# Patient Record
Sex: Female | Born: 1975 | Race: White | Hispanic: No | Marital: Married
Health system: Midwestern US, Academic
[De-identification: ages and names within clinical notes are randomized; demographics above are authoritative.]

---

## 2016-11-04 ENCOUNTER — Encounter: Admit: 2016-11-04 | Discharge: 2016-11-04 | Payer: Private Health Insurance - Indemnity

## 2016-11-04 DIAGNOSIS — M79672 Pain in left foot: Principal | ICD-10-CM

## 2016-11-09 ENCOUNTER — Encounter: Admit: 2016-11-09 | Discharge: 2016-11-09 | Payer: Private health insurance—other commercial Indemnity

## 2016-11-09 ENCOUNTER — Encounter: Admit: 2016-11-09 | Discharge: 2016-11-09 | Payer: Private Health Insurance - Indemnity

## 2016-11-09 DIAGNOSIS — S86312A Strain of muscle(s) and tendon(s) of peroneal muscle group at lower leg level, left leg, initial encounter: Secondary | ICD-10-CM

## 2016-11-09 DIAGNOSIS — R69 Illness, unspecified: Principal | ICD-10-CM

## 2016-11-09 DIAGNOSIS — E8881 Metabolic syndrome: Principal | ICD-10-CM

## 2016-11-09 MED ORDER — OXYCODONE 5 MG PO TAB
5-10 mg | ORAL_TABLET | ORAL | 0 refills | 6.00000 days | Status: AC | PRN
Start: 2016-11-09 — End: 2017-01-28

## 2016-11-09 NOTE — Progress Notes
Patient scheduled surgery during office visit.  Date of surgery determined based on availability of both patient and Rosina Lowenstein, MD.  The patient was scheduled for Left peroneal tendon repair on 12/15/2016 at [facility].  Patient was provided with Rosina Lowenstein, MD pre-surgery packet along with post op medications and MRI order.      POV to be scheduled 2 weeks post op with Rosina Lowenstein, MD. She will be called with arrival date and time for day of surgery once scheduling is completed.   Questions answered and reassurance given.  Instructed to call [phone] for further questions or problems.    Verbalized understanding of the instructions given.      Dr. Alba Cory and patient have agreed to schedule procedure as extended recovery after surgery: No    Vitals:  Vitals:    11/09/16 1331   BP: (!) 172/111   Pulse: 79   Resp: 16   SpO2: 98%   Weight: 136.1 kg (300 lb)   Height: 182.9 cm (72")     Body mass index is 40.69 kg/m.

## 2016-11-09 NOTE — Progress Notes
Date of Service: 11/09/2016      Chief Complaint   Patient presents with   ??? 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  She is quite active and has been having this left ankle and foot pain for about a year.  To this point she is tried immobilization for about 8 weeks, physical therapy, and a steroid injection without significant relief.  She localizes her pain to the lateral aspect of her foot and ankle.  She reports that uneven ground and long distances seem to aggravate this.  She reports the steroid injection helped for a few weeks but she started having some skin changes in that area.  She does report some dorsal foot pain as well but she feels that this is compensatory for her other pain.         I have personally reviewed the patient intake form with the patient today, it was signed by me and scanned into O2. Please see below for details.         Past Medical History:  Past Medical History:   Diagnosis Date   ??? Insulin resistance        Past Surgical History:   Procedure Laterality Date   ??? HX TONSILLECTOMY         Allergies:  Patient has no known allergies.    Current Medications:  ??? metFORMIN (GLUCOPHAGE) 1,000 mg tablet Take 1,000 mg by mouth twice daily with meals.   ??? montelukast (SINGULAIR) 10 mg tablet Take 10 mg by mouth at bedtime daily.       Social History:  History   Smoking Status   ??? Never Smoker   Smokeless Tobacco   ??? Never Used     History   Drug Use No     History   Alcohol Use No       Family History   Problem Relation Age of Onset   ??? Heart Attack Mother    ??? Cancer Mother    ??? Diabetes Mother    ??? Heart Disease Father    ??? Hypothyroid Father        Review Of Systems:  A 14 point review of systems including HEENT, cardiovascular, pulmonary, gastrointestinal,?genitourinary, psychiatric, neurologic, musculoskeletal, endocrine, and integumentary are negative unless otherwise noted in the history of present illness or on the signed intake form.        Objective:           Vitals:    11/09/16 1331   BP: (!) 172/111   Pulse: 79   Resp: 16   SpO2: 98%   Weight: 136.1 kg (300 lb)   Height: 182.9 cm (72)     Body mass index is 40.69 kg/m???.     General alert cooperative no acute distress  Heart regular  Abdomen soft  Breathing unlabored on room air    Left foot and ankle exam: Patient is approximately 30 degrees of dorsiflexion about her ankle with proximally 40 degrees of plantarflexion.  She has some tenderness palpation along the entire course of her peroneal tendons however the majority of her pain is at the insertion site of her peroneus brevis.  She does have some tenderness palpation along her posterior tibial tendon but this is not as bothersome for her.  She has 5 out of 5 strength with resisted eversion inversion however both of these cause lateral sided ankle  and foot pain.  She has 5 out of 5 strength with dorsi and plantarflexion of her ankle.  Her sensation is intact light touch.  Her pulses are palpable.  She does not have any Achilles tendon contracture.    Imaging: 3 views left foot and ankle weightbearing x-rays were obtained demonstrate no significant osseous abnormality.  An outside hospital MRI of her foot was reviewed and demonstrate some split tearing of the peroneus brevis near its insertion site with some evidence of tendinopathy of both peroneal tendons.  This MRI however does not include the entirety of the peroneal tendons and stops just distal to the fibula.    Assessment and plan  41 year old female  1.  Left split tear peroneus brevis with peroneal tendinitis    Discussed the diagnosis and treatment options with the patient.  We discussed with her that given her failure of nonoperative treatment we are recommending a peroneal tendon exploration with repair of the peroneus brevis as indicated.  We would like to obtain an MRI of her ankle to assess the proximal extent of the peroneal tendons given her tenderness along these to ensure that there is not a more proximal tendon tear.  She would like to undergo the above procedure.  The risks and benefits of this procedure would discussed in detail the patient's.  These risks include infection, continued pain, DVT, PE and need for additional procedures.  She understood all of these risks and the postoperative convalescence was discussed with the patient and she elected to proceed.    ATTESTATION  I personally performed the E/M including history, physical exam, and MDM.    Staff name:  Abran Duke, MD Date:  11/09/2016          Abran Duke, MD    Portions of this noted may have been created using Dragon, a voice recognition software.  Please contact my office for any clarification of documentation    Please send a copy of office notes to the primary care physician and referring providers

## 2016-11-10 ENCOUNTER — Ambulatory Visit: Admit: 2016-11-09 | Discharge: 2016-11-09 | Payer: Private health insurance—other commercial Indemnity

## 2016-11-10 ENCOUNTER — Ambulatory Visit: Admit: 2016-11-10 | Discharge: 2016-11-10 | Payer: Private Health Insurance - Indemnity

## 2016-11-10 DIAGNOSIS — M79672 Pain in left foot: ICD-10-CM

## 2016-11-10 DIAGNOSIS — M7672 Peroneal tendinitis, left leg: Principal | ICD-10-CM

## 2016-11-10 DIAGNOSIS — S86312A Strain of muscle(s) and tendon(s) of peroneal muscle group at lower leg level, left leg, initial encounter: ICD-10-CM

## 2016-11-11 ENCOUNTER — Encounter: Admit: 2016-11-11 | Discharge: 2016-11-11 | Payer: Private health insurance—other commercial Indemnity

## 2016-11-11 ENCOUNTER — Ambulatory Visit: Admit: 2016-12-15 | Discharge: 2016-12-15 | Payer: Private Health Insurance - Indemnity

## 2016-11-11 DIAGNOSIS — M7672 Peroneal tendinitis, left leg: ICD-10-CM

## 2016-11-11 DIAGNOSIS — S86312D Strain of muscle(s) and tendon(s) of peroneal muscle group at lower leg level, left leg, subsequent encounter: ICD-10-CM

## 2016-11-11 DIAGNOSIS — S86312S Strain of muscle(s) and tendon(s) of peroneal muscle group at lower leg level, left leg, sequela: Principal | ICD-10-CM

## 2016-11-11 MED ORDER — CEFAZOLIN INJ 1GM IVP
2 g | Freq: Once | INTRAVENOUS | 0 refills | Status: CN
Start: 2016-11-11 — End: ?

## 2016-12-09 ENCOUNTER — Encounter: Admit: 2016-12-09 | Discharge: 2016-12-09 | Payer: Private health insurance—other commercial Indemnity

## 2016-12-09 DIAGNOSIS — J302 Other seasonal allergic rhinitis: ICD-10-CM

## 2016-12-09 DIAGNOSIS — E8881 Metabolic syndrome: Principal | ICD-10-CM

## 2016-12-09 NOTE — Pre-Anesthesia Medication Instructions
YOUR MEDICATIONS    Current Medications    Medication Directions   metFORMIN (GLUCOPHAGE) 1,000 mg tablet Take 1,000 mg by mouth twice daily with meals.   montelukast (SINGULAIR) 10 mg tablet Take 10 mg by mouth at bedtime daily.   oxyCODONE (ROXICODONE, OXY-IR) 5 mg tablet Take one tablet to two tablets by mouth every 4-6 hours as needed for Pain Do not take prior to surgery.           Before surgery  Stop these medicines days before surgery:  HOLD MORNING OF SURGERY GLUCOPHAGE      Morning of surgery  On the morning of surgery, take ONLY these medicines with a sip (1-2 ounces) of water:  NA    Before going home from the hospital, please ask your doctor when you should re-start your medicines that were stopped before surgery.

## 2016-12-09 NOTE — Pre-Anesthesia Patient Instructions
GENERAL INFORMATION    Before you come to the hospital  ??? Make arrangements for a responsible adult to drive you home and stay with you for 24 hours following surgery.  ??? Bath/Shower Instructions  ??? Please refer to Pre-Surgery Shower Instruction sheet  ??? Leave money, credit cards, jewelry, and any other valuables at home. The [facility] is not responsible for the loss or breakage of personal items.  ??? Remove nail polish, makeup and all jewelry (including piercings) before coming to the hospital.  ??? The morning of your procedure:  ??? brush your teeth and tongue  ??? do not smoke  ??? do not shave the area where you will have surgery    What to bring to the hospital  ??? ID/ Insurance Card  ??? Medical Device card  ??? Official documents for legal guardianship   ??? Copy of your Living Will, Advanced Directives, and/or Durable Power of Attorney   ??? Small bag with a few personal belongings  ??? Cases for glasses/hearing aids/contact lens (bring solutions for contacts)  ??? Dress in clean, loose, comfortable clothing   ??? Other personal items such as canes, walkers, and medications in original containers if applicable  ??? CPAP or BiPAP Machine: If it is a [employer]/Respironics System One machine please bring machine/humidifier and tubing/mask. All other brands, please bring tubing/mask only on the day of surgery.     Eating or drinking before surgery  ??? Do not eat anything after 11:00 p.m. the day before your procedure (including gum, mints, candy, or chewing tobacco).  ??? Other instructions: You may have water, tea, apple or cranberry juice, or coffee (black or with sugar only - NO CREAM OR MILK) until 1045 day of surgery.     Other instructions  Notify your surgeon if:  ??? there is a possibility that you are pregnant  ??? you become ill with a cough, fever, sore throat, nausea, vomiting or flu-like symptoms  ??? you have any open wounds/sores that are red, painful, draining, or are new since you last saw  the doctor ??? you need to cancel your procedure    Notify us at [facility]: [phone]  ??? if you need to cancel your procedure  ??? if you are going to be late    Arrival at the hospital  [place] - Your surgery is scheduled on Nov 21 at 1245.  Please arrive at 1045  ??? The [address] is located at [employer]. This is at the [employer] of [address]  ??? Use the main entrance of the hospital, at the east side of the building. Parking is free.  ??? Check-in for surgery is inside the main entrance.

## 2016-12-10 ENCOUNTER — Encounter: Admit: 2016-12-10 | Discharge: 2016-12-11 | Payer: Private Health Insurance - Indemnity

## 2016-12-10 DIAGNOSIS — R69 Illness, unspecified: Principal | ICD-10-CM

## 2016-12-13 ENCOUNTER — Encounter: Admit: 2016-12-13 | Discharge: 2016-12-13 | Payer: Private health insurance—other commercial Indemnity

## 2016-12-13 DIAGNOSIS — R69 Illness, unspecified: Principal | ICD-10-CM

## 2016-12-15 ENCOUNTER — Encounter: Admit: 2016-12-15 | Discharge: 2016-12-15 | Payer: Private health insurance—other commercial Indemnity

## 2016-12-15 ENCOUNTER — Encounter: Admit: 2016-12-15 | Discharge: 2016-12-15 | Payer: Private Health Insurance - Indemnity

## 2016-12-15 ENCOUNTER — Ambulatory Visit: Admit: 2016-12-15 | Discharge: 2016-12-15 | Payer: Private health insurance—other commercial Indemnity

## 2016-12-15 DIAGNOSIS — S93492A Sprain of other ligament of left ankle, initial encounter: ICD-10-CM

## 2016-12-15 DIAGNOSIS — J302 Other seasonal allergic rhinitis: ICD-10-CM

## 2016-12-15 DIAGNOSIS — M7672 Peroneal tendinitis, left leg: ICD-10-CM

## 2016-12-15 DIAGNOSIS — S86312A Strain of muscle(s) and tendon(s) of peroneal muscle group at lower leg level, left leg, initial encounter: Principal | ICD-10-CM

## 2016-12-15 DIAGNOSIS — E8881 Metabolic syndrome: Principal | ICD-10-CM

## 2016-12-15 MED ORDER — HALOPERIDOL LACTATE 5 MG/ML IJ SOLN
1 mg | Freq: Once | INTRAVENOUS | 0 refills | Status: DC | PRN
Start: 2016-12-15 — End: 2016-12-16

## 2016-12-15 MED ORDER — FENTANYL CITRATE (PF) 50 MCG/ML IJ SOLN
0 refills | Status: DC
Start: 2016-12-15 — End: 2016-12-15
  Administered 2016-12-15: 20:00:00 25 ug via INTRAVENOUS
  Administered 2016-12-15 (×2): 50 ug via INTRAVENOUS
  Administered 2016-12-15: 20:00:00 25 ug via INTRAVENOUS
  Administered 2016-12-15 (×2): 50 ug via INTRAVENOUS

## 2016-12-15 MED ORDER — LIDOCAINE (PF) 10 MG/ML (1 %) IJ SOLN
.1-2 mL | Freq: Once | INTRAMUSCULAR | 0 refills | Status: DC
Start: 2016-12-15 — End: 2016-12-16

## 2016-12-15 MED ORDER — DEXTRAN 70-HYPROMELLOSE (PF) 0.1-0.3 % OP DPET
0 refills | Status: DC
Start: 2016-12-15 — End: 2016-12-15
  Administered 2016-12-15: 20:00:00 2 [drp] via OPHTHALMIC

## 2016-12-15 MED ORDER — ACETAMINOPHEN 500 MG PO TAB
1000 mg | Freq: Once | ORAL | 0 refills | Status: CP
Start: 2016-12-15 — End: ?
  Administered 2016-12-15: 19:00:00 1000 mg via ORAL

## 2016-12-15 MED ORDER — FENTANYL CITRATE (PF) 50 MCG/ML IJ SOLN
25 ug | INTRAVENOUS | 0 refills | Status: DC | PRN
Start: 2016-12-15 — End: 2016-12-16

## 2016-12-15 MED ORDER — OXYCODONE SR 10 MG PO 12HR TABLET
10 mg | Freq: Once | ORAL | 0 refills | Status: CP
Start: 2016-12-15 — End: ?
  Administered 2016-12-15: 19:00:00 10 mg via ORAL

## 2016-12-15 MED ORDER — MIDAZOLAM 1 MG/ML IJ SOLN
INTRAVENOUS | 0 refills | Status: DC
Start: 2016-12-15 — End: 2016-12-15
  Administered 2016-12-15: 20:00:00 2 mg via INTRAVENOUS

## 2016-12-15 MED ORDER — ONDANSETRON HCL (PF) 4 MG/2 ML IJ SOLN
INTRAVENOUS | 0 refills | Status: DC
Start: 2016-12-15 — End: 2016-12-15
  Administered 2016-12-15: 21:00:00 4 mg via INTRAVENOUS

## 2016-12-15 MED ORDER — PROMETHAZINE 25 MG/ML IJ SOLN
6.25 mg | INTRAVENOUS | 0 refills | Status: DC | PRN
Start: 2016-12-15 — End: 2016-12-16

## 2016-12-15 MED ORDER — DEXAMETHASONE SODIUM PHOSPHATE 4 MG/ML IJ SOLN
INTRAVENOUS | 0 refills | Status: DC
Start: 2016-12-15 — End: 2016-12-15
  Administered 2016-12-15: 20:00:00 5 mg via INTRAVENOUS

## 2016-12-15 MED ORDER — HYDROMORPHONE (PF) 2 MG/ML IJ SYRG
.5 mg | INTRAVENOUS | 0 refills | Status: DC | PRN
Start: 2016-12-15 — End: 2016-12-16

## 2016-12-15 MED ORDER — CEFAZOLIN INJ 1GM IVP
3 g | Freq: Once | INTRAVENOUS | 0 refills | Status: CP
Start: 2016-12-15 — End: ?
  Administered 2016-12-15: 21:00:00 3 g via INTRAVENOUS

## 2016-12-15 MED ORDER — PROPOFOL INJ 10 MG/ML IV VIAL
0 refills | Status: DC
Start: 2016-12-15 — End: 2016-12-15
  Administered 2016-12-15: 21:00:00 20 mg via INTRAVENOUS
  Administered 2016-12-15: 20:00:00 200 mg via INTRAVENOUS
  Administered 2016-12-15: 21:00:00 20 mg via INTRAVENOUS
  Administered 2016-12-15: 21:00:00 50 mg via INTRAVENOUS
  Administered 2016-12-15: 21:00:00 10 mg via INTRAVENOUS
  Administered 2016-12-15 (×5): 20 mg via INTRAVENOUS

## 2016-12-15 MED ORDER — LIDOCAINE (PF) 200 MG/10 ML (2 %) IJ SYRG
0 refills | Status: DC
Start: 2016-12-15 — End: 2016-12-15
  Administered 2016-12-15: 20:00:00 100 mg via INTRAVENOUS

## 2016-12-15 MED ORDER — LACTATED RINGERS IV SOLP
1000 mL | INTRAVENOUS | 0 refills | Status: DC
Start: 2016-12-15 — End: 2016-12-16
  Administered 2016-12-15: 21:00:00 1000.000 mL via INTRAVENOUS
  Administered 2016-12-15: 17:00:00 1000 mL via INTRAVENOUS

## 2016-12-15 MED ORDER — ONDANSETRON HCL (PF) 4 MG/2 ML IJ SOLN
4 mg | Freq: Once | INTRAVENOUS | 0 refills | Status: DC | PRN
Start: 2016-12-15 — End: 2016-12-16

## 2016-12-15 MED ORDER — FENTANYL CITRATE (PF) 50 MCG/ML IJ SOLN
50 ug | INTRAVENOUS | 0 refills | Status: DC | PRN
Start: 2016-12-15 — End: 2016-12-16

## 2016-12-15 MED ORDER — BACITRACIN-POLYMYXIN B 500-10,000 UNIT/GRAM TP OINT
0 refills | Status: DC
Start: 2016-12-15 — End: 2016-12-15
  Administered 2016-12-15: 21:00:00 1 g via TOPICAL

## 2016-12-15 MED ORDER — MEPERIDINE (PF) 25 MG/ML IJ SYRG
12.5 mg | INTRAVENOUS | 0 refills | Status: DC | PRN
Start: 2016-12-15 — End: 2016-12-16

## 2016-12-15 MED ORDER — GABAPENTIN 300 MG PO CAP
300 mg | Freq: Once | ORAL | 0 refills | Status: CP
Start: 2016-12-15 — End: ?
  Administered 2016-12-15: 19:00:00 300 mg via ORAL

## 2016-12-15 MED ORDER — BACITRACIN 50,000 UN NS 1000 ML IRR BOT (OR)
0 refills | Status: DC
Start: 2016-12-15 — End: 2016-12-15
  Administered 2016-12-15 (×2): 1000 mL

## 2016-12-15 MED ORDER — OXYCODONE 5 MG PO TAB
5-10 mg | Freq: Once | ORAL | 0 refills | Status: CP | PRN
Start: 2016-12-15 — End: ?
  Administered 2016-12-15: 22:00:00 10 mg via ORAL

## 2016-12-15 MED ORDER — ROPIVACAINE (PF) 5 MG/ML (0.5 %) IJ SOLN
0 refills | Status: DC
Start: 2016-12-15 — End: 2016-12-15
  Administered 2016-12-15: 22:00:00 30 mL

## 2016-12-15 NOTE — Progress Notes
I have reviewed the notes, assessments, and/or procedures performed by Felizardo Hoffmann SN, and concur with her documentation unless otherwise noted.

## 2016-12-15 NOTE — Anesthesia Post-Procedure Evaluation
Post-Anesthesia Evaluation    Name: Jennifer Durham      MRN: [id number]     DOB: Feb 12, 1975     Age: 41 y.o.     Sex: female   __________________________________________________________________________     Procedure Date: 12/15/2016  Procedure: Procedure(s):  LEFT PERONEAL TENDON REPAIR      Surgeon: Surgeon(s):  Vopat, Doran Durand, MD    Post-Anesthesia Vitals  BP: 143/97 (11/21 1630)  Temp: 36.7 C (98.1 F) (11/21 1630)  Pulse: 70 (11/21 1630)  Respirations: 8 PER MINUTE (11/21 1630)  SpO2: 95 % (11/21 1630)  O2 Delivery: None (Room Air) (11/21 1630)  SpO2 Pulse: 71 (11/21 1630)  Height: 182.9 cm (72") (11/21 1018)      Post Anesthesia Evaluation Note    Evaluation location: Pre/Post  Patient participation: recovered; patient participated in evaluation  Level of consciousness: alert    Pain score: 2  Pain management: adequate    Hydration: normovolemia  Temperature: 36.0C - 38.4C  Airway patency: adequate    Regional/Neuraxial:       Neurological status: sensory deficit      Single injection shot performed    Perioperative Events  Perioperative events:  no       Post-op nausea and vomiting: no PONV    Postoperative Status  Cardiovascular status: hemodynamically stable  Respiratory status: spontaneous ventilation  Follow-up needed: none        Perioperative Events  Perioperative Event: No  Emergency Case Activation: No

## 2016-12-15 NOTE — Progress Notes
I have given crutches to the patient, adjusted them and provided complete instructions on safe use.

## 2016-12-15 NOTE — H&P (View-Only)
??? Left Foot - New Patient       ???  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  She is quite active and has been having this left ankle and foot pain for about a year.  To this point she is tried immobilization for about 8 weeks, physical therapy, and a steroid injection without significant relief.  She localizes her pain to the lateral aspect of her foot and ankle.  She reports that uneven ground and long distances seem to aggravate this.  She reports the steroid injection helped for a few weeks but she started having some skin changes in that area.  She does report some dorsal foot pain as well but she feels that this is compensatory for her other pain.  ???  ???  I have personally reviewed the patient intake form with the patient today, it was signed by me and scanned into O2. Please see below for details.  ???  ???  Past Medical History:       Past Medical History:   Diagnosis Date   ??? Insulin resistance ???   ???  ???        Past Surgical History:   Procedure Laterality Date   ??? HX TONSILLECTOMY ??? ???   ???  ???  Allergies:  Patient has no known allergies.  ???  Current Medications:  ??? metFORMIN (GLUCOPHAGE) 1,000 mg tablet Take 1,000 mg by mouth twice daily with meals.   ??? montelukast (SINGULAIR) 10 mg tablet Take 10 mg by mouth at bedtime daily.   ???  ???  Social History:      History   Smoking Status   ??? Never Smoker   Smokeless Tobacco   ??? Never Used   ???      History   Drug Use No   ???      History   Alcohol Use No   ???  ???        Family History   Problem Relation Age of Onset   ??? Heart Attack Mother ???   ??? Cancer Mother ???   ??? Diabetes Mother ???   ??? Heart Disease Father ???   ??? Hypothyroid Father ???   ???  ???  Review Of Systems:  A 14 point review of systems including HEENT, cardiovascular, pulmonary, gastrointestinal,?genitourinary, psychiatric, neurologic, musculoskeletal, endocrine, and integumentary are negative unless otherwise noted in the history of present illness or on the signed intake form.  ???  ???  ??? Objective:         ???      Vitals:   ??? 11/09/16 1331   BP: (!) 172/111   Pulse: 79   Resp: 16   SpO2: 98%   Weight: 136.1 kg (300 lb)   Height: 182.9 cm (72)   ???  Body mass index is 40.69 kg/m???.   ???  General alert cooperative no acute distress  Heart regular  Abdomen soft  Breathing unlabored on room air  ???  Left foot and ankle exam: Patient is approximately 30 degrees of dorsiflexion about her ankle with proximally 40 degrees of plantarflexion.  She has some tenderness palpation along the entire course of her peroneal tendons however the majority of her pain is at the insertion site of her peroneus brevis.  She does have some tenderness palpation along her posterior tibial tendon but this is not as bothersome for her.  She has 5 out of 5 strength with resisted eversion inversion  however both of these cause lateral sided ankle and foot pain.  She has 5 out of 5 strength with dorsi and plantarflexion of her ankle.  Her sensation is intact light touch.  Her pulses are palpable.  She does not have any Achilles tendon contracture.  ???  Imaging: 3 views left foot and ankle weightbearing x-rays were obtained demonstrate no significant osseous abnormality.  An outside hospital MRI of her foot was reviewed and demonstrate some split tearing of the peroneus brevis near its insertion site with some evidence of tendinopathy of both peroneal tendons.  This MRI however does not include the entirety of the peroneal tendons and stops just distal to the fibula.  ???  Assessment and plan  41 year old female  1.  Left split tear peroneus brevis with peroneal tendinitis  ???  Discussed the diagnosis and treatment options with the patient.  We discussed with her that given her failure of nonoperative treatment we are recommending a peroneal tendon exploration with repair of the peroneus brevis as indicated.  We would like to obtain an MRI of her ankle to assess the proximal extent of the peroneal tendons given her tenderness along these to ensure that there is not a more proximal tendon tear.  She would like to undergo the above procedure.  The risks and benefits of this procedure would discussed in detail the patient's.  These risks include infection, continued pain, DVT, PE and need for additional procedures.  She understood all of these risks and the postoperative convalescence was discussed with the patient and she elected to proceed.  ???

## 2016-12-15 NOTE — Other
Brief Operative Note    Name: Jennifer Durham is a 41 y.o. female     DOB: [birth year]             MRN#: [id number]  DATE OF OPERATION: 12/15/2016    Date:  12/15/2016        Preoperative Dx:   Tear of peroneal tendon, left, sequela [S86.312S]  Peroneal tendinitis of left lower extremity [M76.72]    Post-op Diagnosis      * Tear of peroneal tendon, left, sequela [S86.312S]     * Peroneal tendinitis of left lower extremity [M76.72]    Procedure(s) (LRB):  LEFT PERONEAL TENDON REPAIR (Left)    Anesthesia Type: General    Surgeon(s) and Role:     * Britteney Ayotte, Doran Durand, MD - Primary      Findings:  Left peroneal brevis tear    Estimated Blood Loss: No blood loss documented.     Specimen(s) Removed/Disposition: * No specimens in log *    Complications:  None    Implants: None    Drains: None    Disposition:  PACU - stable    Rosina Lowenstein, MD  Pager

## 2016-12-15 NOTE — Anesthesia Procedure Notes
Anesthesia Procedure: Peripheral Nerve Block    PERIPHERAL NERVE BLOCK  Date/Time: 12/15/2016 4:11 PM    Patient location: post-op  Reason for block: post-op pain management    Preprocedure checklist performed: 2 patient identifiers, risks & benefits discussed, patient evaluated, timeout performed, consent obtained and patient being monitored    Sterile technique:  - Proper hand washing  - Cap, mask  - Sterile gloves  - Skin prep for antisepsis        Peripheral Nerve Block Procedure   Patient position: supine  Prep: ChloraPrep    Monitoring: BP, EKG and continuous pulse ox  Block type: popliteal  Laterality: left  Injection technique: single-shot  Procedures: ultrasound guided      Needle/cathether:      Needle type: Stimuplex      Needle gauge: 22 G; Needle length: 4 in     Needle location: ultrasound guidance    Procedure Outcome   Injection assessment: negative aspiration for heme, no paresthesia on injection, incremental injection and local visualized surrounding nerve on ultrasound  Observations: adequate block, comfortable throughout block, no sedation and patient tolerated the procedure well with no immediate complications          Performed by: Fransisco Beau  Authorized by: Fransisco Beau

## 2016-12-20 ENCOUNTER — Encounter: Admit: 2016-12-20 | Discharge: 2016-12-20 | Payer: Private health insurance—other commercial Indemnity

## 2016-12-20 DIAGNOSIS — E8881 Metabolic syndrome: Principal | ICD-10-CM

## 2016-12-20 DIAGNOSIS — J302 Other seasonal allergic rhinitis: ICD-10-CM

## 2017-01-04 ENCOUNTER — Ambulatory Visit: Admit: 2017-01-04 | Discharge: 2017-01-05 | Payer: Private Health Insurance - Indemnity

## 2017-01-04 ENCOUNTER — Encounter: Admit: 2017-01-04 | Discharge: 2017-01-04 | Payer: Private Health Insurance - Indemnity

## 2017-01-04 DIAGNOSIS — S86312S Strain of muscle(s) and tendon(s) of peroneal muscle group at lower leg level, left leg, sequela: ICD-10-CM

## 2017-01-04 DIAGNOSIS — E8881 Metabolic syndrome: Principal | ICD-10-CM

## 2017-01-04 DIAGNOSIS — J302 Other seasonal allergic rhinitis: ICD-10-CM

## 2017-01-05 DIAGNOSIS — Z9889 Other specified postprocedural states: Principal | ICD-10-CM

## 2017-01-05 NOTE — Progress Notes
Jennifer Durham, Jennifer Durham  MRN #[id number]    January 04, 2017    SUBJECTIVE:  The patient is status post left peroneus brevis repair and peroneus longus tenolysis on 12/15/2016.  She is doing very well and has minimal pain.    PHYSICAL EXAMINATION:  On physical examination, the incisions are clean, dry, and intact.  She has a little bit of numbness over the sural nerve distribution, but she is able to feel sensation.    ASSESSMENT AND PLAN:  Status post surgery as above.  The patient will have the sutures out and be placed in a cast.  She will be touch-down weightbearing.  She will follow up in four weeks.  We will get her out of the cast at that time and start partial weightbearing.  She agreed with this plan and all questions were answered.        BV/abc:kap Abran Duke, MD          BP 145/77  - Pulse 88  - Resp 16  - Ht 182.9 cm (72)  - Wt 136.1 kg (300 lb)  - SpO2 96%  - BMI 40.69 kg/m???     No orders of the defined types were placed in this encounter.      Current Medications:  ??? levonorgestrel (MIRENA) 20 mcg/24 hr (5 years) intrauterine device   1 EA, X 1 DOSE, Intrauteral, 1 EA, 0 Number of Refills, DEV   ??? metFORMIN (GLUCOPHAGE) 1,000 mg tablet Take 1,000 mg by mouth twice daily with meals.   ??? montelukast (SINGULAIR) 10 mg tablet Take 10 mg by mouth at bedtime daily.   ??? oxyCODONE (ROXICODONE, OXY-IR) 5 mg tablet Take one tablet to two tablets by mouth every 4-6 hours as needed for Pain Do not take prior to surgery.     No Known Allergies      General Physical Exam:  General/Constitutional:No apparent distress: well-nourished and well developed.  Eyes: Sclera nonicteric, conjunctiva clear  Respiratory:No shortness of breath or dyspnea  Cardiac: No clubbing, cyanosis, or edema  Vascular: No edema, swelling or tenderness, except as noted in detailed exam.  Integumentary:No impressive skin lesions present, except as noted in detailed exam. Neuro/Psych: Normal mood and affect, oriented to person, place and time.  Musculoskeletal: Normal, except as noted in detailed exam and in HPI.    ATTESTATION    I personally performed the E/M including history, physical exam, and MDM.    Staff name:  Abran Duke, MD Date:  01/05/2017          Abran Duke, MD    Portions of this noted may have been created using Dragon, a voice recognition software.  Please contact my office for any clarification of documentation.    Please send a copy of office notes to the primary care physician and referring providers.

## 2017-01-19 ENCOUNTER — Encounter: Admit: 2017-01-19 | Discharge: 2017-01-19 | Payer: Private health insurance—other commercial Indemnity

## 2017-01-19 NOTE — Telephone Encounter
Tomorrow called today and said she has leg cramps in her surgical leg, L, and I suggested she elevate and hydrate with lots of water.  Her surgery was 11/21/.  She will call tomorrow if she still has concerns.  All questions were answered.    Derrill Memo, RN, CPAN

## 2017-01-28 ENCOUNTER — Encounter: Admit: 2017-01-28 | Discharge: 2017-01-28 | Payer: Private Health Insurance - Indemnity

## 2017-01-28 ENCOUNTER — Ambulatory Visit: Admit: 2017-01-28 | Discharge: 2017-01-29 | Payer: Private Health Insurance - Indemnity

## 2017-01-28 DIAGNOSIS — E8881 Metabolic syndrome: Principal | ICD-10-CM

## 2017-01-28 DIAGNOSIS — J302 Other seasonal allergic rhinitis: ICD-10-CM

## 2017-01-29 DIAGNOSIS — Z9889 Other specified postprocedural states: Principal | ICD-10-CM

## 2017-03-11 ENCOUNTER — Ambulatory Visit: Admit: 2017-03-11 | Discharge: 2017-03-12 | Payer: Private Health Insurance - Indemnity

## 2017-03-11 ENCOUNTER — Encounter: Admit: 2017-03-11 | Discharge: 2017-03-11 | Payer: Private Health Insurance - Indemnity

## 2017-03-11 DIAGNOSIS — J302 Other seasonal allergic rhinitis: ICD-10-CM

## 2017-03-11 DIAGNOSIS — E8881 Metabolic syndrome: Principal | ICD-10-CM

## 2017-03-12 DIAGNOSIS — Z9889 Other specified postprocedural states: Principal | ICD-10-CM

## 2017-04-28 ENCOUNTER — Encounter: Admit: 2017-04-28 | Discharge: 2017-04-28 | Payer: Private Health Insurance - Indemnity

## 2017-04-28 DIAGNOSIS — E8881 Metabolic syndrome: Principal | ICD-10-CM

## 2017-04-28 DIAGNOSIS — J302 Other seasonal allergic rhinitis: ICD-10-CM

## 2017-07-15 ENCOUNTER — Encounter: Admit: 2017-07-15 | Discharge: 2017-07-15 | Payer: Private health insurance—other commercial Indemnity

## 2017-07-15 DIAGNOSIS — E8881 Metabolic syndrome: Principal | ICD-10-CM

## 2017-07-15 DIAGNOSIS — J302 Other seasonal allergic rhinitis: ICD-10-CM

## 2017-07-16 ENCOUNTER — Ambulatory Visit: Admit: 2017-07-15 | Discharge: 2017-07-16 | Payer: Private Health Insurance - Indemnity

## 2017-07-16 DIAGNOSIS — Z9889 Other specified postprocedural states: Principal | ICD-10-CM

## 2018-02-02 ENCOUNTER — Encounter: Admit: 2018-02-02 | Discharge: 2018-02-02

## 2018-02-02 DIAGNOSIS — M25572 Pain in left ankle and joints of left foot: Secondary | ICD-10-CM

## 2019-07-10 ENCOUNTER — Encounter: Admit: 2019-07-10 | Discharge: 2019-07-10 | Payer: Private Health Insurance - Indemnity

## 2019-07-10 DIAGNOSIS — C50911 Malignant neoplasm of unspecified site of right female breast: Secondary | ICD-10-CM

## 2019-07-11 ENCOUNTER — Encounter: Admit: 2019-07-11 | Discharge: 2019-07-11 | Payer: Private Health Insurance - Indemnity

## 2019-07-11 NOTE — Progress Notes
H&E Slides of [facility] # [id number] Path Date  07/04/19, requested on 07/11/19  From [facility].    Contact for outside path lab is @ [phone]   No shipping label faxed per request of outside facility.  Path slides being sent via courier.

## 2019-07-12 ENCOUNTER — Encounter: Admit: 2019-07-12 | Discharge: 2019-07-12 | Payer: Private Health Insurance - Indemnity

## 2019-07-12 DIAGNOSIS — C50919 Malignant neoplasm of unspecified site of unspecified female breast: Secondary | ICD-10-CM

## 2019-07-12 DIAGNOSIS — J302 Other seasonal allergic rhinitis: Secondary | ICD-10-CM

## 2019-07-12 DIAGNOSIS — E8881 Metabolic syndrome: Secondary | ICD-10-CM

## 2019-07-13 ENCOUNTER — Encounter: Admit: 2019-07-13 | Discharge: 2019-07-13 | Payer: Private Health Insurance - Indemnity

## 2019-07-16 ENCOUNTER — Ambulatory Visit: Admit: 2019-07-16 | Discharge: 2019-07-16 | Payer: Private Health Insurance - Indemnity

## 2019-07-16 ENCOUNTER — Encounter: Admit: 2019-07-16 | Discharge: 2019-07-16 | Payer: Private Health Insurance - Indemnity

## 2019-07-16 DIAGNOSIS — J302 Other seasonal allergic rhinitis: Secondary | ICD-10-CM

## 2019-07-16 DIAGNOSIS — C50911 Malignant neoplasm of unspecified site of right female breast: Secondary | ICD-10-CM

## 2019-07-16 DIAGNOSIS — C50919 Malignant neoplasm of unspecified site of unspecified female breast: Secondary | ICD-10-CM

## 2019-07-16 DIAGNOSIS — E282 Polycystic ovarian syndrome: Secondary | ICD-10-CM

## 2019-07-16 DIAGNOSIS — E8881 Metabolic syndrome: Secondary | ICD-10-CM

## 2019-07-16 DIAGNOSIS — I10 Essential (primary) hypertension: Secondary | ICD-10-CM

## 2019-07-16 DIAGNOSIS — C50411 Malignant neoplasm of upper-outer quadrant of right female breast: Secondary | ICD-10-CM

## 2019-07-16 DIAGNOSIS — Z01818 Encounter for other preprocedural examination: Secondary | ICD-10-CM

## 2019-07-16 DIAGNOSIS — Z9189 Other specified personal risk factors, not elsewhere classified: Secondary | ICD-10-CM

## 2019-07-16 LAB — COMPREHENSIVE METABOLIC PANEL
Lab: 0.3 mg/dL (ref 0.3–1.2)
Lab: 1.2 mg/dL — ABNORMAL HIGH (ref 0.4–1.00)
Lab: 10 10*3/uL (ref 3–12)
Lab: 10 mg/dL (ref 8.5–10.6)
Lab: 104 MMOL/L (ref 98–110)
Lab: 11 U/L (ref 7–40)
Lab: 133 mg/dL — ABNORMAL HIGH (ref 70–100)
Lab: 140 MMOL/L (ref 137–147)
Lab: 20 mg/dL (ref 7–25)
Lab: 26 MMOL/L (ref 21–30)
Lab: 3.8 MMOL/L (ref 3.5–5.1)
Lab: 4.6 g/dL (ref 3.5–5.0)
Lab: 46 mL/min — ABNORMAL LOW (ref >60)
Lab: 56 U/L (ref 25–110)
Lab: 56 mL/min — ABNORMAL LOW (ref >60)
Lab: 7 U/L — ABNORMAL HIGH (ref 7–56)
Lab: 7.8 g/dL (ref 6.0–8.0)

## 2019-07-16 LAB — CBC AND DIFF
Lab: 0.1 10*3/uL (ref 0–0.20)
Lab: 12 10*3/uL — ABNORMAL HIGH (ref 4.5–11.0)
Lab: 4.7 M/UL (ref 4.0–5.0)

## 2019-07-16 NOTE — Progress Notes
Name: Jennifer Durham          MRN: [id number]      DOB: Jun 17, 1975      AGE: 44 y.o.   DATE OF SERVICE: 07/16/2019                Reason for Visit:  Heme/Onc Care      Jennifer Durham is a 44 y.o. female.     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 Jennifer Durham noted right breast dimpling in May 2021. She was sent for diagnostic imaging at an outside facility on 07/04/19. At that time a new right breast mass was seen as well as a lymph node with cortical thickening and biopsy was recommended. Right breast sono-guided biopsy 07/04/19 [name]) revealed grade 2 invasive cancer with mixed ductal and lobular features. Right axillary sono-guided biopsy 07/04/19 [name]) revealed fragments of polymorphous lymphoid tissue. No evidence of malignancy. She denies breast pain. She denies nipple discharge.  She has a history of insulin resistance and HTN. She has a family history of Breast cancer in her mother and maternal aunt and ovarian cancer in a maternal aunt. She is a non-smoker and performs daily activities without difficulty.      BREAST IMAGING:  Mammogram:    -- Bilateral diagnostic mammogram 07/04/19 [name]) revealed heterogeneously dense breast tissue. Spiculated mass with surrounding architectural distortion measuring up to 2 cm in the upper right medial breast at 10-11:00, 7.7 cm posterior to the nipple. Slightly prominent asymmetric right axillary lymph nodes.  -- Right diagnostic mammogram 07/16/19 ([place]) revealed an the outside right MLO view from 07/04/2019, there was a small mass in the probable 9:00 position of the right breast at posterior depth, 2 cm inferior and 2 cm posterior to the known malignancy. Additional mammographic images will be performed to determine location. 2-D and 3-D images of the right breast were obtained.   In the 9:30 to 10:00 position of the right breast at middle to posterior depth, there was a 1.7 x 1.9 cm spiculated mass with internal tissue marker clip corresponding to biopsy-proven   malignancy. The biopsy-proven malignancy is approximately 8 cm posterior to the right nipple. In the 8:30 to 9:00 position of the right breast at posterior depth, there was an 0.8 cm lobular equal density mass which was not definitively seen on 2-D mammogram images from 03/21/2013.     Ultrasound:    -- Right breast ultrasound 07/04/19 [facility]) revealed hypoechoic spiculated mass with surrounding architectural distortion with in the right upper breast at 10:00, 6 cm FTN. Significant posterior acoustic shadowing. Overall extent 1.3 x 1.7 x 1.6 cm. Increased cortical mantle thickening of a right axillary lymph node measuring up to 4 mm. BI-RADS 5.   -- Targeted right breast ultrasound 07/16/19 ([place]) revealed at 9:30, 9 cm from the nipple, demonstrated a 1.4 x 1.8 x 1.6 cm irregular hypoechoic  mass with internal tissue marker clip corresponding to the biopsy-proven malignancy. In the right breast at 9:00, 12 cm from the nipple, there ws an 0.8 cm morphologically normal small  intramammary lymph node corresponding to the additional mammographic mass identified. No suspicious right axillary lymph nodes were seen. 3 morphologically normal right axillary lymph nodes were identified. The lymph node labeled #3, likely contains the internal tissue marker clip from outside biopsy.       REPRODUCTIVE HEALTH:  Age at first Menarche:   41 Age at First Live Birth:  68  Age at Menopause:  Premenopausal - has mirena  Gravida:  2  Para: 2  Breastfeeding:  Yes    PROCEDURE: pending  PERTINENT PMH:  Insulin resistance, HTN  FAMILY HISTORY:  Mother-Breast cancer diagnosed in her early to mid 37's. Maternal Aunt-Breast cancer diagnosed in her 31's. Maternal Aunt-Ovarian cancer diagnosed in her early 41's  MEDICAL ONCOLOGY:  Dr. Welton Flakes    REFERRED BY:  [facility]         Review of Systems    Constitutional: Negative for fever, chills, appetite change and fatigue.   HENT: Negative for hearing loss, congestion, rhinorrhea and tinnitus.  sinus pressure  Eyes: Negative for pain, discharge and itching.   Respiratory: Negative for cough, chest tightness and shortness of breath.    Cardiovascular: Negative for chest pain and palpitations.   Gastrointestinal: Negative for abdominal distention, pain, nausea, vomiting, and diarrhea.   Genitourinary: Negative for frequency, vaginal bleeding, difficulty urinating and pelvic pain. menstrual problem  Musculoskeletal: Negative for myalgias, back pain, joint swelling and arthralgias.   Skin: Negative for rash. adenopathy  Neurological: Negative for dizziness, weakness, light-headedness and headaches.   Hematological: Does not bruise/bleed easily.   Psychiatric/Behavioral: Negative for disturbed wake/sleep cycle. The patient is not nervous/anxious.    No Known Allergies    Medical History:   Diagnosis Date   ? Breast cancer (HCC) 07/04/2019   ? Hypertension    ? Insulin resistance    ? Seasonal allergies     Frequent sinusitis     Surgical History:   Procedure Laterality Date   ? HX TONSILLECTOMY  2001    turbinates/adenoids 2015   ? LEFT PERONEAL TENDON REPAIR Left 12/15/2016    Performed by Vopat, Lowry Ram, MD at [facility]   ? HX TONSIL AND ADENOIDECTOMY       Family History   Problem Relation Age of Onset   ? Heart Attack Mother    ? Cancer Mother    ? Diabetes Mother    ? Cancer-Breast Mother    ? Heart Disease Mother    ? Hypertension Mother    ? Heart Disease Father    ? Hypothyroid Father    ? Hypertension Father    ? Thyroid Disease Father    ? Hypertension Brother    ? Cancer-Breast Maternal Aunt    ? Cancer-Ovarian Maternal Aunt    ? Diabetes Maternal Aunt    ? Arthritis-rheumatoid Maternal Aunt    ? Diabetes Maternal Grandmother    ? Heart Disease Maternal Grandfather      Social History     Socioeconomic History   ? Marital status: Married     Spouse name: Not on file   ? Number of children: Not on file   ? Years of education: Not on file   ? Highest education level: Not on file   Occupational History   ? Not on file  Tobacco Use   ? Smoking status: Never Smoker   ? Smokeless tobacco: Never Used   Substance and Sexual Activity   ? Alcohol use: Yes     Alcohol/week: 1.0 standard drinks     Types: 1 Shots of liquor per week     Comment: 1x per month   ? Drug use: No   ? Sexual activity: Not on file   Other Topics Concern   ? Not on file   Social History Narrative   ? Not on file                   Objective:         ? levonorgestrel (MIRENA) 20 mcg/24 hr (5 years) intrauterine device   1 EA, X 1 DOSE, Intrauteral, 1 EA, 0 Number of Refills, DEV   ? metFORMIN (GLUCOPHAGE) 1,000 mg tablet Take 1,000 mg by mouth twice daily with meals.   ? montelukast (SINGULAIR) 10 mg tablet Take 10 mg by mouth at bedtime daily.   ? triamterene-hydrochlorothiazide (MAXZIDE) 75-50 mg tablet      Vitals:    07/16/19 1004   BP: (!) 112/92   BP Source: Arm, Left Upper   Patient Position: Sitting   Pulse: 113   Resp: 14   Temp: 36.7 ?C (98.1 ?F)   TempSrc: Temporal   SpO2: 97%   Weight: (!) 141.5 kg (312 lb)   Height: 181 cm (71.26)   PainSc: Zero     Body mass index is 43.2 kg/m?Marland Kitchen     Pain Score: Zero       Fatigue Scale: 3    Pain Addressed:  N/A    Patient Evaluated for a Clinical Trial: No treatment clinical trial available for this patient.     [place] Cooperative Oncology Group performance status is 0, Fully active, able to carry on all pre-disease performance without restriction.Marland Kitchen     Physical Exam  Vitals reviewed.       RIGHT BREAST EXAM:  Breast:  2cm palpable mass at 9:30 w/ associated skin dimpling. No nipple discharge  Skin Erythema:  No  Attachment of Overlying Skin:  Yes  Peau d' orange:  No  Chest Wall Attachment:  No  Nipple Inversion:  No  Nipple Discharge: No    LEFT BREAST EXAM:  Breast: No palpable masses, skin changes, nipple discharge  Skin Erythema:  No  Attachment of Overlying Skin:  No  Peau d' orange:  No  Chest Wall Attachment: No  Nipple Inversion:  No  Nipple Discharge:  No    RIGHT NODAL BASIN EXAM:  Axillary:  negative  Infraclavicular:  negative  Supraclavicular:  negative    LEFT NODAL BASIN EXAM:  Axillary:  negative  Infraclavicular: negative  Supraclavicular:  negative      Constitutional: Well-developed and well-nourished. No acute distress.  HEENT:  Head: Normocephalic and atraumatic.  Eyes: Pupils are equal, round and reactive to light. No discharge. No scleral icterus.  Cardiovascular: Normal rate, regular rhythm and normal heart sounds. No murmur or gallop.  Pulmonary/Chest: Effort normal and breath sounds normal. No respiratory distress. No wheezes. No rales.   Musculoskeletal: Normal range of motion. No edema.  Neurological: Alert and oriented to person, place and time. No cranial nerve deficit.  Skin: Warm and dry. No rash noted. No erythema. No pallor.  Psychiatric: Normal mood and affect. Behavior is normal. Judgement and thought content normal.  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 Welton Flakes to coordinate care and he is in agreement with the below plan.    The diagnosis, including type of breast cancer, tumor markers, grade, and stage were discussed today. We discussed the natural history of breast cancer from in situ to invasive disease.  We discussed the need for a multidisciplinary approach to breast cancer treatment with local and systemic therapy.  We reviewed her imaging results including the additional targeted imaging. We discussed the need for additional imaging with MRI of the breasts. It is documented that she has heterogeneously dense breasts so we discussed the utility in obtaining MRI imaging.  With her recently diagnosed cancer we will be able to fully evaluate the breasts and the extent of disease which could alter surgical planning. We also discussed the ability of MRI to detect additional areas of concern that could warrant additional biopsy.  She expressed her understanding and wishes to proceed. We discussed local treatment options of the breast in detail including lumpectomy with adjuvant radiation as well as mastectomy.  Similar risk of local/regional recurrence with BCT and mastectomy, with no survival benefit for mastectomy. We discussed the need for adjuvant radiation following lumpectomy. She states that due to her family history she leans towards bilateral mastectomy with reconstruction but is also awaiting genetic testing results before finalizing surgical plan.  We discussed that if she did not desire mastectomy she could also be a good candidate for right RSL lumpectomy with bilateral  oncoplastic reduction mastopexy. We discussed radioactive seed localized lumpectomy with need for seed placement prior to the procedure. We discussed the lumpectomy procedure in detail including the use of local tissue rearrangement to improve cosmesis and reduce seroma formation. We discussed the possible positive margins found with lumpectomy and need for margin re-excision. We also discussed potential complications including but not limited to bleeding, infection, seroma, positive margins needing additional operation, and cosmetic deformity. We then discussed mastectomy in more detail. Total versus skin sparing mastectomy was discussed with the patient.  The details of the mastectomy procedure including paresthesia, NAC removal, and reconstruction as a phase process were discussed.  We also discussed potential complications including but not limited to bleeding, infection, seroma, hematoma, positive margins needing additional operation, and cosmetic deformity. She states she understands the benefits and risks of the procedure. She states she is interested in pursuing reconstruction so we will arrange a plastic surgery consult. We will await her genetic testing results before finalizing surgical plan. We also discussed the need for SLNB to evaluate for nodal metastasis and and pathologic staging. We discussed the intraoperative lymphatic mapping and sentinel lymph node biopsy procedures. With mastectomy intraoperative evaluation of the SLN will be performed with an ALND performed if a SLN is found to be positive for carcinoma. We discussed the risk of lymphedema associated with nodal surgery.  A baseline measurement with the BIS will be obtained and she will follow in our Lymphedema clinic. Jennifer Durham and her husband were given ample time to ask questions all which were answered to her satisfaction.  She expressed her understanding and are ready proceed with the plan as outlined above. She was provided contact information was encouraged to call with any follow-up questions or concerns.      Summary of plan is as follows:  1. Breast  MRI - will call with results  2. Genetic testing - draw by Dr. Welton Flakes today - will await results before finalizing surgical plan  3. Meet with Dr. Welton Flakes today  4. Lymphedema education post op  5. PRS consult - bilateral SSM w/ recon vs right RSL lump and bilateral reduction mastopexy  6. RTC post op   7. Recommend removing Mirena      Guy Begin, PA-C    ATTESTATION:     I personally reviewed images and pathology, performed the breast exam and history, discussion, assessment, and treatment plan. The case was discussed with Andrew Au, PA-C and I have altered the documentation as needed to reflect my assessment and plan.    Renford Dills, MD

## 2019-07-16 NOTE — Progress Notes
BIS obtained for baseline measurements prior to surgery.    Baseline BIS = 2.4, Right   No notification indicated

## 2019-07-16 NOTE — Progress Notes
Bioimpedance Spectroscopy performed. Advised patient that Normal result will be sent within 24 hours by mail or via Mychart (preferred). The patient will be contacted via phone by the lymphedema nurse with any abnormal results.

## 2019-07-17 ENCOUNTER — Encounter: Admit: 2019-07-17 | Discharge: 2019-07-17 | Payer: Private Health Insurance - Indemnity

## 2019-07-17 DIAGNOSIS — R7309 Other abnormal glucose: Secondary | ICD-10-CM

## 2019-07-17 NOTE — Progress Notes
Myriad kit sent out today, tracking  [id number].

## 2019-07-17 NOTE — Telephone Encounter
Called PT and left message for PT to call the OOC number or check her MyChart regarding her scheduled appts. Mailed AVS.

## 2019-07-17 NOTE — Telephone Encounter
STPT and rescheduled her appts.  Mailed AVS.

## 2019-07-19 ENCOUNTER — Encounter: Admit: 2019-07-19 | Discharge: 2019-07-19 | Payer: Private Health Insurance - Indemnity

## 2019-07-19 DIAGNOSIS — C50411 Malignant neoplasm of upper-outer quadrant of right female breast: Secondary | ICD-10-CM

## 2019-07-20 ENCOUNTER — Ambulatory Visit: Admit: 2019-07-20 | Discharge: 2019-07-20 | Payer: Private Health Insurance - Indemnity

## 2019-07-20 ENCOUNTER — Encounter: Admit: 2019-07-20 | Discharge: 2019-07-20 | Payer: Private Health Insurance - Indemnity

## 2019-07-20 DIAGNOSIS — Z9229 Personal history of other drug therapy: Secondary | ICD-10-CM

## 2019-07-20 DIAGNOSIS — C50411 Malignant neoplasm of upper-outer quadrant of right female breast: Secondary | ICD-10-CM

## 2019-07-20 DIAGNOSIS — Z4001 Encounter for prophylactic removal of breast: Secondary | ICD-10-CM

## 2019-07-20 DIAGNOSIS — R7309 Other abnormal glucose: Secondary | ICD-10-CM

## 2019-07-20 DIAGNOSIS — C50911 Malignant neoplasm of unspecified site of right female breast: Secondary | ICD-10-CM

## 2019-07-20 LAB — HEMOGLOBIN A1C: Lab: 5.8 % (ref 4.0–6.0)

## 2019-07-20 MED ORDER — GADOBENATE DIMEGLUMINE 529 MG/ML (0.1MMOL/0.2ML) IV SOLN
20 mL | Freq: Once | INTRAVENOUS | 0 refills | Status: CP
Start: 2019-07-20 — End: ?
  Administered 2019-07-20: 19:00:00 20 mL via INTRAVENOUS

## 2019-07-20 MED ORDER — SODIUM CHLORIDE 0.9 % IJ SOLN
50 mL | Freq: Once | INTRAVENOUS | 0 refills | Status: DC
Start: 2019-07-20 — End: 2019-07-25

## 2019-07-20 MED ORDER — CEFAZOLIN INJ 1GM IVP
2 g | Freq: Once | INTRAVENOUS | 0 refills | Status: CN
Start: 2019-07-20 — End: ?

## 2019-07-23 ENCOUNTER — Encounter: Admit: 2019-07-23 | Discharge: 2019-07-23 | Payer: Private Health Insurance - Indemnity

## 2019-07-23 NOTE — Telephone Encounter
I spoke with Jennifer Durham regarding her breast MRI. The known breast cancer measured 1.9 cm which was similar in size to the ultrasound. There was a 7 mm mass that corresponded to the benign intramammary lymph node seen on ultrasound. No additional suspicious findings in either breast. We are still waiting on her genetic testing to make a decision. Based on these results she can proceed with either mastectomy or lumpectomy with oncoplastics and we will follow up once we have the genetics.    MRI BREAST BILAT W/O W CONTRAST: July 20, 2019 - ACCESSION   #: [id number]   Prior study comparison: July 16, 2019, right breast [id number] MAMMO   DIAGNOSTIC RT/TOMO performed at [facility]. ?July 16, 2019, right breast [id number] US BREAST TARGET RT   performed at [facility] ?July 04, 2019, mammogram.     History: 44 year old female, Known biopsy-proven malignancy,   extent of disease.     Menstrual status: IUD in place, no recent LMP.     Technique:     Bilateral breast MRI was performed with dedicated breast coil,   with and without contrast 20 mL MultiHance in the axial plane   using fat saturation, 3D, and with additional T1 and T2 axial   images obtained. Images were interpreted with the aid of CAD,   including 3D generation of MIP images, subtractions, and   pharmacokinetic analysis. 0.2 ml/kg of Multihance (gadolinium)   was injected in bolus fashion.     Findings:     Background enhancement: Mild     Tissue density: Heterogeneously dense     Right Breast: There is an irregular spiculated 1.9 x 1.8 x 1.9 cm   early enhancing mass within the right upper outer breast at the   10:00 position approximately 8 cm from the nipple with a clip in   place, compatible previously biopsied malignancy. There is an   additional smaller 0.7 cm oval enhancing mass at the 9:00   position approximately 2 cm posterior and 1 cm caudal to the   biopsied malignancy, most consistent with the intramammary lymph   node noted on prior ultrasound 07/16/2019. No morphologically   suspicious right axillary or internal mammary lymph nodes     Left Breast: There is no evidence of abnormal enhancement, mass,   or axillary/internal mammary adenopathy.     Ancillary findings: None     Impression:     1. Irregular spiculated mass within the right upper outer breast,   compatible with previously biopsied malignancy.   2.No enlarged or morphologically suspicious lymph nodes.     Guy Begin, PA-C

## 2019-07-26 ENCOUNTER — Encounter: Admit: 2019-07-26 | Discharge: 2019-07-26 | Payer: BC Managed Care – PPO

## 2019-07-26 ENCOUNTER — Ambulatory Visit: Admit: 2019-07-26 | Discharge: 2019-07-26 | Payer: BC Managed Care – PPO

## 2019-07-26 DIAGNOSIS — J302 Other seasonal allergic rhinitis: Secondary | ICD-10-CM

## 2019-07-26 DIAGNOSIS — C50411 Malignant neoplasm of upper-outer quadrant of right female breast: Secondary | ICD-10-CM

## 2019-07-26 DIAGNOSIS — C50919 Malignant neoplasm of unspecified site of unspecified female breast: Secondary | ICD-10-CM

## 2019-07-26 DIAGNOSIS — E282 Polycystic ovarian syndrome: Secondary | ICD-10-CM

## 2019-07-26 DIAGNOSIS — E8881 Metabolic syndrome: Secondary | ICD-10-CM

## 2019-07-26 DIAGNOSIS — I10 Essential (primary) hypertension: Secondary | ICD-10-CM

## 2019-07-26 NOTE — Progress Notes
Subjective:       History of Present Illness  Jennifer Durham is a 44 y.o. female.  Cancer Staging  Malignant neoplasm of upper-outer quadrant of right breast in female, estrogen receptor positive (HCC)  Staging form: Breast, AJCC 8th Edition  - Clinical stage from 07/04/2019: Stage IA (cT1c, cN0(f), cM0, G2, ER+, PR+, HER2-) - Signed by Guy Begin, PA-C on 07/12/2019    Patient presents to clinic to discuss bilateral breast reconstruction options. Patient is interested in autologous breast reconstruction. Patient states she wears a 42DD cup bra and would like to remain proportional to her body habitus. Patient states that following a breast biopsy she had a rash/blistering from tegaderm on the lateral aspect of her right breast. Past medical history HTN, Insulin resistance (HgbA1c 5.8%), PCOS and seasonal allergies. Patient has family history of breast cancer in her Mother and maternal aunt. Patient is a non smoker and drinks alcohol occasionally. Patient has a history of two pregnancies, two live vaginal births and denies miscarriage.     Allergies   Allergen Reactions   ? Tegaderm RASH   Review of Systems   Constitutional: Negative.    HENT: Positive for sinus pressure.    Eyes: Negative.    Respiratory: Negative.    Cardiovascular: Negative.    Gastrointestinal: Negative.    Endocrine: Negative.    Genitourinary: Negative.    Musculoskeletal: Negative.    Skin: Negative.    Allergic/Immunologic: Negative.    Neurological: Negative.    Hematological: Negative.    Psychiatric/Behavioral: Negative.      Medical History:   Diagnosis Date   ? Breast cancer (HCC) 07/04/2019   ? Hypertension    ? Insulin resistance    ? PCOS (polycystic ovarian syndrome)    ? Seasonal allergies     Frequent sinusitis     Surgical History:   Procedure Laterality Date   ? HX TONSILLECTOMY  2001    turbinates/adenoids 2015   ? LEFT PERONEAL TENDON REPAIR Left 12/15/2016    Performed by Vopat, Lowry Ram, MD at [facility]   ? HX TONSIL AND ADENOIDECTOMY       Family History   Problem Relation Age of Onset   ? Heart Attack Mother    ? Cancer Mother    ? Diabetes Mother    ? Cancer-Breast Mother 59   ? Heart Disease Mother    ? Hypertension Mother    ? Heart Disease Father    ? Hypothyroid Father    ? Hypertension Father    ? Thyroid Disease Father    ? Hypertension Brother    ? Cancer-Breast Maternal Aunt 50   ? Cancer-Ovarian Maternal Aunt 50   ? Diabetes Maternal Aunt    ? Arthritis-rheumatoid Maternal Aunt    ? Diabetes Maternal Grandmother    ? Heart Disease Maternal Grandfather      Social History     Socioeconomic History   ? Marital status: Married     Spouse name: Not on file   ? Number of children: Not on file   ? Years of education: Not on file   ? Highest education level: Not on file   Occupational History   ? Not on file   Tobacco Use   ? Smoking status: Never Smoker   ? Smokeless tobacco: Never Used   Substance and Sexual Activity   ? Alcohol use: Yes     Alcohol/week: 1.0 standard drinks  Types: 1 Shots of liquor per week     Comment: 1x per month   ? Drug use: No   ? Sexual activity: Not on file   Other Topics Concern   ? Not on file   Social History Narrative   ? Not on file     Objective:         ? levonorgestrel (MIRENA) 20 mcg/24 hr (5 years) intrauterine device   1 EA, X 1 DOSE, Intrauteral, 1 EA, 0 Number of Refills, DEV   ? metFORMIN (GLUCOPHAGE) 1,000 mg tablet Take 1,000 mg by mouth twice daily with meals.   ? montelukast (SINGULAIR) 10 mg tablet Take 10 mg by mouth at bedtime daily.   ? triamterene-hydrochlorothiazide (MAXZIDE) 75-50 mg tablet Take 1 tablet by mouth daily.     Vitals:    07/26/19 1523   BP: 127/85   Pulse: 108   Weight: 136.1 kg (300 lb)   Height: 182.9 cm (72)   PainSc: Zero     Body mass index is 40.69 kg/m?Marland Kitchen     Physical Exam  Vitals reviewed.   Constitutional:       Appearance: Normal appearance. She is well-groomed.   HENT:      Head: Normocephalic.   Pulmonary:      Effort: Pulmonary effort is normal.   Chest:       Abdominal:      Palpations: Abdomen is soft.       Skin:     General: Skin is warm and dry.   Neurological:      Mental Status: She is alert.   Psychiatric:         Mood and Affect: Mood normal.         Behavior: Behavior normal. Behavior is cooperative.         Thought Content: Thought content normal.         Judgment: Judgment normal.              Assessment and Plan:  44 y/o female with right breast cancer.  Patient is interested in autologous breast reconstruction.   Plan  1) Placement of bilateral breast tissue expanders and biological mesh.    Discussed with patient that breast tissue expanders are temporary devices that are placed in the breast pocket following a mastectomy. Tissue expanders are initially filled with air during original procedure and eventually the air is exchanged for saline in the office using a wing tip needle. Gradual inflation of the device allows the patient to achieve desired breast volume prior to exchange to breast implant or autologous breast reconstruction. Patient in agreement with surgical plan.     Informed consent:  The risks, benefits, goals, and alternatives were reviewed, as were blood transfusions. advanced directives, and team approach.  The patient is in full understanding and agrees to proceed with the aforementioned procedure.  Total time 45 minutes.  Estimated counseling time greater than 60% of visit.  Counseled patient regarding bilateral breast reconstruction.

## 2019-08-02 ENCOUNTER — Encounter: Admit: 2019-08-02 | Discharge: 2019-08-02 | Payer: BC Managed Care – PPO

## 2019-08-02 DIAGNOSIS — C50411 Malignant neoplasm of upper-outer quadrant of right female breast: Secondary | ICD-10-CM

## 2019-08-03 ENCOUNTER — Encounter: Admit: 2019-08-03 | Discharge: 2019-08-03 | Payer: BC Managed Care – PPO

## 2019-08-03 NOTE — Telephone Encounter
Called patient to discuss DOS 7/27 for B SSM R IOLM SLNB pALND with Dr. Vear Clock at [facility] Scheduled patient for post-op appointment with Dr. Vear Clock approximately 2 weeks after DOS. Reviewed pre-op instructions with patient and instructed patient to expect a phone call from anesthesia a few days prior to surgery with surgery start time and what time to arrive at the hospital on day of surgery. Discussed that I will send detailed pre operative and post operative instructions via MyChart to her today for her review. Encouraged her to contact me with any further questions regarding upcoming surgery. Patient verbalizes understanding.  All questions answered. Patient given contact information for additional questions or concerns.

## 2019-08-06 ENCOUNTER — Encounter: Admit: 2019-08-06 | Discharge: 2019-08-06 | Payer: BC Managed Care – PPO

## 2019-08-06 DIAGNOSIS — E8881 Metabolic syndrome: Secondary | ICD-10-CM

## 2019-08-06 DIAGNOSIS — C50919 Malignant neoplasm of unspecified site of unspecified female breast: Secondary | ICD-10-CM

## 2019-08-06 DIAGNOSIS — E282 Polycystic ovarian syndrome: Secondary | ICD-10-CM

## 2019-08-06 DIAGNOSIS — J302 Other seasonal allergic rhinitis: Secondary | ICD-10-CM

## 2019-08-06 DIAGNOSIS — I10 Essential (primary) hypertension: Secondary | ICD-10-CM

## 2019-08-07 ENCOUNTER — Encounter: Admit: 2019-08-07 | Discharge: 2019-08-07 | Payer: BC Managed Care – PPO

## 2019-08-07 NOTE — Telephone Encounter
Called patient after receiving email communication from navigation that patient had LVM stating her insurance coverage had changed effective 7/1. Educated patient that her new insurance was loaded into the chart and that it appears that our pre cert department is working on authorization for surgery with her current insurance coverage. Patient inquires if we need a copy of the card. Instructed patient to send a picture of the card via MyChart message if she was able, but that they should also take a copy of it when they register her the day of surgery. Patient verbalizes understanding.  All questions answered. Patient given contact information for additional questions or concerns.

## 2019-08-20 ENCOUNTER — Encounter: Admit: 2019-08-20 | Discharge: 2019-08-20 | Payer: BC Managed Care – PPO

## 2019-08-20 NOTE — Anesthesia Pre-Procedure Evaluation
Anesthesia Pre-Procedure Evaluation    Name: Jennifer Durham      MRN: [id number]     DOB: 1975-05-06     Age: 44 y.o.     Sex: female   _________________________________________________________________________     Procedure Info:   Procedure Information     Date/Time: 08/21/19 0914    Procedures:       Bilateral Skin Sparing Mastectomy (Bilateral Breast) - Dr. Vear Clock case length 2.5 hours, total case length 3.5 hours  SLNB @ 10:00  Injection: IcG  Frozen: Yes      IDENTIFICATION SENTINEL LYMPH NODE (Right )      INJECTION RADIOACTIVE TRACER FOR SENTINEL NODE IDENTIFICATION (Right )      Right Sentinel Lymph Node Biopsy (Right Axilla)      Possible Right Axillary Lymph Node Dissection (Right Axilla)      RECONSTRUCTION BREAST WITH TISSUE EXPANDER AND SUBSEQUENT EXPANSION - IMMEDIATE/ DELAYED (Bilateral )      IMPLANTATION BIOLOGIC IMPLANT FOR SOFT TISSUE REINFORCEMENT (Bilateral )    Location: [facility] / [facility]    Surgeons: Renford Dills, MD; Marlinda Mike, MD          Physical Assessment  Vital Signs (last filed in past 24 hours):         Patient History   Allergies   Allergen Reactions   ? Tegaderm RASH        Current Medications    Medication Directions   levonorgestrel (MIRENA) 20 mcg/24 hr (5 years) intrauterine device   1 EA, X 1 DOSE, Intrauteral, 1 EA, 0 Number of Refills, DEV   metFORMIN (GLUCOPHAGE) 1,000 mg tablet Take 1,000 mg by mouth twice daily with meals.   montelukast (SINGULAIR) 10 mg tablet Take 10 mg by mouth at bedtime daily.   triamterene-hydrochlorothiazide (MAXZIDE) 75-50 mg tablet Take 1 tablet by mouth daily.         Review of Systems/Medical History      Patient summary reviewed  Nursing notes reviewed  Pertinent labs reviewed    PONV Screening: Female gender, Non-smoker and Postoperative opioids  No history of anesthetic complications  No family history of anesthetic complications      Airway - negative        Pulmonary - negative          Cardiovascular Exercise tolerance: >4 METS      Beta Blocker therapy: No      Beta blockers within 24 hours: n/a        Hypertension, well controlled      Pt denies any h/o MI, CHF or significant arrhythmias.  Pt denies any CP or SOB with > 4 metabolic equivalents.          GI/Hepatic/Renal - negative        Neuro/Psych - negative        Musculoskeletal - negative        Endocrine/Other         Malignancy (breast cancer)      Obesity      PCOS  Insulin resistance    Constitution - negative   Physical Exam    Airway Findings      Mallampati: II      TM distance: >3 FB      Neck ROM: full      Mouth opening: good      Airway patency: adequate    Dental Findings: Negative      Cardiovascular Findings: Negative  Rhythm: regular      Rate: normal    Pulmonary Findings: Negative      Breath sounds clear to auscultation.    Neurological Findings: Negative      Constitutional findings: Negative       Diagnostic Tests  Hematology:   Lab Results   Component Value Date    HGB 13.4 07/16/2019    HCT 40.0 07/16/2019    PLTCT 335 07/16/2019    WBC 12.8 07/16/2019    NEUT 64 07/16/2019    ANC 8.20 07/16/2019    ALC 3.20 07/16/2019    MONA 7 07/16/2019    AMC 0.90 07/16/2019    EOSA 3 07/16/2019    ABC 0.10 07/16/2019    MCV 85.2 07/16/2019    MCH 28.4 07/16/2019    MCHC 33.3 07/16/2019    MPV 8.2 07/16/2019    RDW 14.3 07/16/2019         General Chemistry:   Lab Results   Component Value Date    NA 140 07/16/2019    K 3.8 07/16/2019    CL 104 07/16/2019    CO2 26 07/16/2019    GAP 10 07/16/2019    BUN 20 07/16/2019    CR 1.27 07/16/2019    GLU 133 07/16/2019    CA 10.0 07/16/2019    ALBUMIN 4.6 07/16/2019    TOTBILI 0.3 07/16/2019      Coagulation: No results found for: PT, PTT, INR      Anesthesia Plan    ASA score: 3   Plan: general  Induction method: intravenous  NPO status: acceptable      Informed Consent  Anesthetic plan and risks discussed with patient.  Use of blood products discussed with patient      Plan discussed with: CRNA.

## 2019-08-21 ENCOUNTER — Encounter: Admit: 2019-08-21 | Discharge: 2019-08-21 | Payer: BC Managed Care – PPO

## 2019-08-21 ENCOUNTER — Ambulatory Visit: Admit: 2019-08-21 | Discharge: 2019-08-21 | Payer: BC Managed Care – PPO

## 2019-08-21 DIAGNOSIS — C50411 Malignant neoplasm of upper-outer quadrant of right female breast: Secondary | ICD-10-CM

## 2019-08-21 DIAGNOSIS — E282 Polycystic ovarian syndrome: Secondary | ICD-10-CM

## 2019-08-21 DIAGNOSIS — J302 Other seasonal allergic rhinitis: Secondary | ICD-10-CM

## 2019-08-21 DIAGNOSIS — C50919 Malignant neoplasm of unspecified site of unspecified female breast: Secondary | ICD-10-CM

## 2019-08-21 DIAGNOSIS — E8881 Metabolic syndrome: Secondary | ICD-10-CM

## 2019-08-21 DIAGNOSIS — I10 Essential (primary) hypertension: Secondary | ICD-10-CM

## 2019-08-21 MED ORDER — CEFAZOLIN INJ 1GM IVP
2 g | Freq: Once | INTRAVENOUS | 0 refills | Status: CP
Start: 2019-08-21 — End: ?
  Administered 2019-08-21 (×2): 3 g via INTRAVENOUS

## 2019-08-21 MED ORDER — FENTANYL CITRATE (PF) 50 MCG/ML IJ SOLN
50 ug | INTRAVENOUS | 0 refills | Status: DC | PRN
Start: 2019-08-21 — End: 2019-08-21

## 2019-08-21 MED ORDER — ACETAMINOPHEN 500 MG PO TAB
1000 mg | ORAL | 0 refills | Status: DC
Start: 2019-08-21 — End: 2019-08-22
  Administered 2019-08-22 (×3): 1000 mg via ORAL

## 2019-08-21 MED ORDER — ACETAMINOPHEN 325 MG PO TAB
650 mg | ORAL_TABLET | ORAL | 0 refills | Status: CN
Start: 2019-08-21 — End: ?

## 2019-08-21 MED ORDER — PROMETHAZINE 25 MG/ML IJ SOLN
6.25 mg | INTRAVENOUS | 0 refills | Status: DC | PRN
Start: 2019-08-21 — End: 2019-08-21

## 2019-08-21 MED ORDER — FENTANYL CITRATE (PF) 50 MCG/ML IJ SOLN
INTRAVENOUS | 0 refills | Status: DC
Start: 2019-08-21 — End: 2019-08-21
  Administered 2019-08-21 (×5): 50 ug via INTRAVENOUS

## 2019-08-21 MED ORDER — MIDAZOLAM 1 MG/ML IJ SOLN
INTRAVENOUS | 0 refills | Status: DC
Start: 2019-08-21 — End: 2019-08-21
  Administered 2019-08-21: 15:00:00 2 mg via INTRAVENOUS

## 2019-08-21 MED ORDER — HALOPERIDOL LACTATE 5 MG/ML IJ SOLN
1 mg | Freq: Once | INTRAVENOUS | 0 refills | Status: DC | PRN
Start: 2019-08-21 — End: 2019-08-21

## 2019-08-21 MED ORDER — DIAZEPAM 5 MG PO TAB
5 mg | ORAL | 0 refills | Status: DC | PRN
Start: 2019-08-21 — End: 2019-08-22
  Administered 2019-08-22 (×2): 5 mg via ORAL

## 2019-08-21 MED ORDER — OXYCODONE 5 MG PO TAB
5-10 mg | Freq: Once | ORAL | 0 refills | Status: DC | PRN
Start: 2019-08-21 — End: 2019-08-21

## 2019-08-21 MED ORDER — PROPOFOL INJ 10 MG/ML IV VIAL
INTRAVENOUS | 0 refills | Status: DC
Start: 2019-08-21 — End: 2019-08-21
  Administered 2019-08-21: 15:00:00 200 mg via INTRAVENOUS

## 2019-08-21 MED ORDER — ACETAMINOPHEN 1,000 MG/100 ML (10 MG/ML) IV SOLN
INTRAVENOUS | 0 refills | Status: DC
Start: 2019-08-21 — End: 2019-08-21
  Administered 2019-08-21: 19:00:00 1000 mg via INTRAVENOUS

## 2019-08-21 MED ORDER — ONDANSETRON HCL (PF) 4 MG/2 ML IJ SOLN
4 mg | INTRAVENOUS | 0 refills | Status: DC | PRN
Start: 2019-08-21 — End: 2019-08-22

## 2019-08-21 MED ORDER — HYDROMORPHONE (PF) 2 MG/ML IJ SYRG
INTRAVENOUS | 0 refills | Status: DC
Start: 2019-08-21 — End: 2019-08-21
  Administered 2019-08-21 (×3): .5 mg via INTRAVENOUS

## 2019-08-21 MED ORDER — DIAZEPAM 5 MG PO TAB
5 mg | ORAL_TABLET | ORAL | 0 refills | 7.00000 days | Status: AC | PRN
Start: 2019-08-21 — End: ?
  Filled 2019-08-21: qty 32, 8d supply, fill #1

## 2019-08-21 MED ORDER — OXYCODONE 5 MG PO TAB
5-10 mg | ORAL | 0 refills | Status: DC | PRN
Start: 2019-08-21 — End: 2019-08-22
  Administered 2019-08-22: 16:00:00 10 mg via ORAL

## 2019-08-21 MED ORDER — FENTANYL CITRATE (PF) 50 MCG/ML IJ SOLN
25 ug | INTRAVENOUS | 0 refills | Status: DC | PRN
Start: 2019-08-21 — End: 2019-08-21

## 2019-08-21 MED ORDER — TRIAMTERENE-HYDROCHLOROTHIAZID 75-50 MG PO TAB
1 | Freq: Every day | ORAL | 0 refills | Status: DC
Start: 2019-08-21 — End: 2019-08-22
  Administered 2019-08-22: 13:00:00 1 via ORAL

## 2019-08-21 MED ORDER — LACTATED RINGERS IV SOLP
1000 mL | INTRAVENOUS | 0 refills | Status: DC
Start: 2019-08-21 — End: 2019-08-21
  Administered 2019-08-21: 18:00:00 1000.000 mL via INTRAVENOUS
  Administered 2019-08-21: 14:00:00 1000 mL via INTRAVENOUS

## 2019-08-21 MED ORDER — MEPERIDINE (PF) 25 MG/ML IJ SYRG
12.5 mg | INTRAVENOUS | 0 refills | Status: DC | PRN
Start: 2019-08-21 — End: 2019-08-21

## 2019-08-21 MED ORDER — PROPOFOL 10 MG/ML IV EMUL 100 ML (INFUSION)(AM)(OR)
INTRAVENOUS | 0 refills | Status: DC
Start: 2019-08-21 — End: 2019-08-21
  Administered 2019-08-21: 15:00:00 200 ug/kg/min via INTRAVENOUS

## 2019-08-21 MED ORDER — INDOCYANINE GREEN 25 MG IJ SOLR
0 refills | Status: DC
Start: 2019-08-21 — End: 2019-08-21
  Administered 2019-08-21: 15:00:00 0.2 mL via INTRAMUSCULAR

## 2019-08-21 MED ORDER — OXYCODONE 5 MG PO TAB
5-10 mg | ORAL_TABLET | ORAL | 0 refills | 6.00000 days | Status: DC | PRN
Start: 2019-08-21 — End: 2019-09-06
  Filled 2019-08-21: qty 16, 3d supply, fill #1

## 2019-08-21 MED ORDER — DOXYCYCLINE HYCLATE 100 MG PO TAB
100 mg | ORAL_TABLET | Freq: Two times a day (BID) | ORAL | 0 refills | 8.00000 days | Status: AC
Start: 2019-08-21 — End: ?
  Filled 2019-08-21: qty 42, 21d supply, fill #1

## 2019-08-21 MED ORDER — SENNOSIDES-DOCUSATE SODIUM 8.6-50 MG PO TAB
1 | Freq: Two times a day (BID) | ORAL | 0 refills | Status: DC
Start: 2019-08-21 — End: 2019-08-22
  Administered 2019-08-22 (×2): 1 via ORAL

## 2019-08-21 MED ORDER — SCOPOLAMINE BASE 1 MG OVER 3 DAYS TD PT3D
1 | Freq: Once | TRANSDERMAL | 0 refills | Status: DC
Start: 2019-08-21 — End: 2019-08-22
  Administered 2019-08-21: 14:00:00 1 via TRANSDERMAL

## 2019-08-21 MED ORDER — MONTELUKAST 10 MG PO TAB
10 mg | Freq: Every evening | ORAL | 0 refills | Status: DC
Start: 2019-08-21 — End: 2019-08-22
  Administered 2019-08-22: 02:00:00 10 mg via ORAL

## 2019-08-21 MED ORDER — CEFAZOLIN INJ 1GM IVP
2 g | INTRAVENOUS | 0 refills | Status: CP
Start: 2019-08-21 — End: ?
  Administered 2019-08-22 (×2): 2 g via INTRAVENOUS

## 2019-08-21 MED ORDER — DEXMEDETOMIDINE IN 0.9 % NACL 20 MCG/5 ML (4 MCG/ML) IV SYRG
INTRAVENOUS | 0 refills | Status: DC
Start: 2019-08-21 — End: 2019-08-21
  Administered 2019-08-21: 20:00:00 10 ug via INTRAVENOUS

## 2019-08-21 MED ORDER — DIAZEPAM 5 MG PO TAB
5 mg | Freq: Once | ORAL | 0 refills | Status: CP
Start: 2019-08-21 — End: ?
  Administered 2019-08-21: 22:00:00 5 mg via ORAL

## 2019-08-21 MED ORDER — KETAMINE 10 MG/ML IJ SOLN
INTRAVENOUS | 0 refills | Status: DC
Start: 2019-08-21 — End: 2019-08-21
  Administered 2019-08-21: 16:00:00 30 mg via INTRAVENOUS

## 2019-08-21 MED ORDER — PROMETHAZINE 25 MG/ML IJ SOLN
12.5 mg | INTRAVENOUS | 0 refills | Status: DC | PRN
Start: 2019-08-21 — End: 2019-08-22

## 2019-08-21 MED ORDER — LIDOCAINE (PF) 20 MG/ML (2 %) IJ SOLN
INTRAVENOUS | 0 refills | Status: DC
Start: 2019-08-21 — End: 2019-08-21
  Administered 2019-08-21: 15:00:00 100 mg via INTRAVENOUS

## 2019-08-21 MED ORDER — RP DX TC-99M SULF COLL MCI
.5 | Freq: Once | INTRAVENOUS | 0 refills | Status: CP
Start: 2019-08-21 — End: ?
  Administered 2019-08-21: 12:00:00 0.5 via INTRAVENOUS

## 2019-08-21 MED ORDER — INSULIN ASPART 100 UNIT/ML SC FLEXPEN
0-24 [IU] | Freq: Before meals | SUBCUTANEOUS | 0 refills | Status: DC
Start: 2019-08-21 — End: 2019-08-22

## 2019-08-21 MED ORDER — ONDANSETRON HCL (PF) 4 MG/2 ML IJ SOLN
INTRAVENOUS | 0 refills | Status: DC
Start: 2019-08-21 — End: 2019-08-21
  Administered 2019-08-21: 19:00:00 4 mg via INTRAVENOUS

## 2019-08-21 MED ORDER — LIDOCAINE (PF) 10 MG/ML (1 %) IJ SOLN
.2 mL | INTRAMUSCULAR | 0 refills | Status: DC | PRN
Start: 2019-08-21 — End: 2019-08-21

## 2019-08-21 MED ORDER — SENNOSIDES-DOCUSATE SODIUM 8.6-50 MG PO TAB
1 | ORAL_TABLET | Freq: Two times a day (BID) | ORAL | 0 refills | Status: DC
Start: 2019-08-21 — End: 2019-09-06

## 2019-08-21 MED ORDER — PATCH DOCUMENTATION - SCOPOLAMINE BASE 1 MG/72HR
1 | Freq: Two times a day (BID) | TRANSDERMAL | 0 refills | Status: DC
Start: 2019-08-21 — End: 2019-08-21

## 2019-08-21 MED ORDER — DEXAMETHASONE SODIUM PHOSPHATE 4 MG/ML IJ SOLN
INTRAVENOUS | 0 refills | Status: DC
Start: 2019-08-21 — End: 2019-08-21
  Administered 2019-08-21: 15:00:00 4 mg via INTRAVENOUS

## 2019-08-21 MED ORDER — MIDAZOLAM 1 MG/ML IJ SOLN
2 mg | Freq: Once | INTRAVENOUS | 0 refills | Status: CP
Start: 2019-08-21 — End: ?
  Administered 2019-08-21: 14:00:00 2 mg via INTRAVENOUS

## 2019-08-21 MED ORDER — MORPHINE 2 MG/ML IV SYRG
2-4 mg | INTRAVENOUS | 0 refills | Status: DC | PRN
Start: 2019-08-21 — End: 2019-08-22

## 2019-08-21 MED ORDER — LACTATED RINGERS IV SOLP
INTRAVENOUS | 0 refills | Status: DC
Start: 2019-08-21 — End: 2019-08-22
  Administered 2019-08-21: 23:00:00 1000.000 mL via INTRAVENOUS

## 2019-08-22 MED ADMIN — SODIUM CHLORIDE 0.9 % IJ SOLN [7319]: 20 mL | INTRAMUSCULAR | @ 11:00:00 | Stop: 2019-08-22 | NDC 00409488803

## 2019-08-22 MED ADMIN — SODIUM CHLORIDE 0.9 % IJ SOLN [7319]: 20 mL | INTRAMUSCULAR | @ 03:00:00 | Stop: 2019-08-22 | NDC 00409488803

## 2019-08-23 ENCOUNTER — Encounter: Admit: 2019-08-23 | Discharge: 2019-08-23 | Payer: BC Managed Care – PPO

## 2019-08-23 DIAGNOSIS — I10 Essential (primary) hypertension: Secondary | ICD-10-CM

## 2019-08-23 DIAGNOSIS — E282 Polycystic ovarian syndrome: Secondary | ICD-10-CM

## 2019-08-23 DIAGNOSIS — E8881 Metabolic syndrome: Secondary | ICD-10-CM

## 2019-08-23 DIAGNOSIS — C50919 Malignant neoplasm of unspecified site of unspecified female breast: Secondary | ICD-10-CM

## 2019-08-23 DIAGNOSIS — J302 Other seasonal allergic rhinitis: Secondary | ICD-10-CM

## 2019-08-24 ENCOUNTER — Encounter: Admit: 2019-08-24 | Discharge: 2019-08-24 | Payer: BC Managed Care – PPO

## 2019-08-24 NOTE — Telephone Encounter
Called patient to check in after L SSM IOLM SLNB ALND on 7/27.  Patient states she has noticed some bruising near the surgical site. Patient states there is no swelling at this time. Discussed that some bruising is not abnormal but she should keep an eye on the area and contact our office if she feels like it begins to swell/a hematoma seems to be forming. She will continue wearing her post op surgical bra for appropriate compression night and day other than to shower. Otherwise, she is doing well and has no other questions or concerns at this time.  Patient confirms post-op appointment for 8/9 at 8am at the [facility] campus. Patient verbalizes understanding.  All questions answered. Patient given contact information for additional questions or concerns.

## 2019-08-29 ENCOUNTER — Encounter: Admit: 2019-08-29 | Discharge: 2019-08-29 | Payer: BC Managed Care – PPO

## 2019-08-29 DIAGNOSIS — C50911 Malignant neoplasm of unspecified site of right female breast: Secondary | ICD-10-CM

## 2019-08-30 ENCOUNTER — Ambulatory Visit: Admit: 2019-08-30 | Discharge: 2019-08-31 | Payer: BC Managed Care – PPO

## 2019-08-30 ENCOUNTER — Encounter: Admit: 2019-08-30 | Discharge: 2019-08-30 | Payer: BC Managed Care – PPO

## 2019-08-30 DIAGNOSIS — I10 Essential (primary) hypertension: Secondary | ICD-10-CM

## 2019-08-30 DIAGNOSIS — E282 Polycystic ovarian syndrome: Secondary | ICD-10-CM

## 2019-08-30 DIAGNOSIS — C50919 Malignant neoplasm of unspecified site of unspecified female breast: Secondary | ICD-10-CM

## 2019-08-30 DIAGNOSIS — J302 Other seasonal allergic rhinitis: Secondary | ICD-10-CM

## 2019-08-30 DIAGNOSIS — E8881 Metabolic syndrome: Secondary | ICD-10-CM

## 2019-09-03 ENCOUNTER — Ambulatory Visit: Admit: 2019-09-03 | Discharge: 2019-09-04 | Payer: BC Managed Care – PPO

## 2019-09-03 ENCOUNTER — Encounter: Admit: 2019-09-03 | Discharge: 2019-09-03 | Payer: BC Managed Care – PPO

## 2019-09-03 DIAGNOSIS — C50411 Malignant neoplasm of upper-outer quadrant of right female breast: Secondary | ICD-10-CM

## 2019-09-03 DIAGNOSIS — I10 Essential (primary) hypertension: Secondary | ICD-10-CM

## 2019-09-03 DIAGNOSIS — E8881 Metabolic syndrome: Secondary | ICD-10-CM

## 2019-09-03 DIAGNOSIS — C50919 Malignant neoplasm of unspecified site of unspecified female breast: Secondary | ICD-10-CM

## 2019-09-03 DIAGNOSIS — E282 Polycystic ovarian syndrome: Secondary | ICD-10-CM

## 2019-09-03 DIAGNOSIS — J302 Other seasonal allergic rhinitis: Secondary | ICD-10-CM

## 2019-09-03 MED ORDER — DIAZEPAM 5 MG PO TAB
5 mg | ORAL_TABLET | ORAL | 0 refills | PRN
Start: 2019-09-03 — End: ?

## 2019-09-03 NOTE — Progress Notes
HPI:  13 days s/p bilateral skin sparing mastectomy/right ALND/TEs for right breast cancer. Ms Wieand reports she is doing well. Her pain is controlled without narcotic pain medication. 2 of her JP drains have been less than 30 ml daily. She continues to follow with Dr. Fran Lowes. She admits to fluctuations in her emotions throughout this whole process.    PHYSICAL EXAM:    Breast:  bilateral    Incision:  C/d/i w/ bandages in place    Erythema:  No    Ecchymosis:  No    Seroma:  No  Axilla:    Erythema:  No    Ecchymosis:  No    Seroma:  No    Upper Extremity:    ROM:  Good    Winged scapula: No    Lymphedema:  No    PATHOLOGY:    Final Diagnosis:     A. Breast, right breast skin sparing mastectomy, short stitch superior,   long stitch lateral, mastectomy:   Invasive ductal carcinoma, histologic grade 2, see comment.   Ductal carcinoma, nuclear grade 2, solid and cribriform patterns.   Fibroadenoma. ?     B. Lymph nodes, right axillary sentinel lymph node #1, biopsy: ?   There is no evidence of malignancy in two lymph?nodes. ?(0/2)   Deeper sections and a pancytokeratin immunostain are negative in   support of the above diagnosis. ?     C. Lymph node, right axillary sentinel lymph node #2, biopsy: ?   There is no evidence of malignancy in one lymph node. ?(0/1)   Deeper sections and a pancytokeratin immunostain are negative in   support of the above diagnosis. ?     D. Lymph node, right axillary sentinel lymph node #3, biopsy: ?   Metastatic carcinoma in one lymph node (1/1), 5 mm in greatest   dimension.   Deeper sections and a pancytokeratin immunostain are positive in   support of the above diagnosis. ?     E. Lymph node, right axillary sentinel lymph node #4: ?   There is no evidence of malignancy in one lymph node. ?(0/1)   Deeper sections and a pancytokeratin immunostain are negative in   support of the above diagnosis. ?     F. Lymph node, right axillary sentinel lymph node #5, biopsy: ?   There is no evidence of malignancy in one lymph node. ?(0/1)   Deeper sections and a pancytokeratin immunostain are negative in   support of the above diagnosis. ?     G. Fibroadipose tissue, right additional anterior lateral margin clip   marks new true margin, excision: ?   Unremarkable fibroadipose tissue.     H. Lymph node, right low axillary lymph node, excision: ?   There is no evidence of malignancy in one lymph node. ?(0/1)     I. Breast, left breast skin sparing mastectomy, short stitch superior,   long stitch lateral, mastectomy: ?   Fibrocystic changes.   There is no evidence of malignancy. ?     J. Lymph nodes, right axillary contents level I and II, axillary   dissection: ?   Negative for malignancy in twenty-seven lymph nodes (0/27).   Biopsy site present in one lymph node.   ? ? ? ? ?   K. Skin, right mastectomy skin, excision:   Dermal fibrosis.     Comment:   INVASIVE CARCINOMA OF THE BREAST: RESECTION   CAP Version: Invasive Breast 4.4.0.0     Procedure:  Skin sparing mastectomy   Laterality: Right   Tumor Site: 9:30, 8 cm FTN   Histologic Type: Invasive ductal carcinoma   Size of Invasive Component: 1.8 x 1.7 x 1.2 cm     Surgical Margins:   Mastectomy   For invasive carcinoma: All margins are greater than 2 cm   For DCIS: All margins are greater than 2 cm   ? ? ? ? ?   Histologic Grade (Nottingham Histologic Score): II/III   Tubule Formation: 3   Nuclear Grade: 3   Mitotic Count (40x objective): 1   Total Nottingham Score: 7/9   Ductal Carcinoma In-situ (DCIS): Present   Extensive intraductal component (>25%): Absent   DCIS within and/or adjacent to invasive carcinoma: Yes   DCIS separate from invasive carcinoma: No ?   Lobular Carcinoma In-situ (LCIS): Absent   Lymph-Vascular Invasion: Not identified   Nipple Involvement: Absent   Skin Involvement: Absent   Skeletal Muscle: Not involved   Lymph Node Sampling:   Sentinel lymph node with axillary dissection   Total number of lymph nodes examined: 34   Number of lymph nodes with macrometastases (>2 mm): 1   Number of lymph nodes with micrometastases (>0.2 mm to 2 mm): 0   Number of lymph nodes with isolated tumor cells (<0.2 mm and   <200 cells): 0   Size of largest metastatic deposit (millimeters): 5 mm   Extranodal extension: Not identified   Treatment Effect in the Breast: No known presurgical therapy   Prognostic markers: The results will be issued as an addendum.   Time between tumor removal and placement into formalin < 1 hour: Yes   Fixation Time between 6-72 hours: Yes     Pathologic Staging (pTNM, AJCC 8th edition): ?pT1c N1a     Primary Tumor (Invasive Carcinoma) (pT)   pT1c: ? ? Tumor >10 mm but d20 mm in greatest dimension     Regional Lymph Nodes (pN)   pN1a: ? ? Metastases in 1 to 3 axillary lymph nodes, at least 1 metastasis   larger than 2.0 mm     Distant Metastasis (M) ?   Not applicable       ASSESSMENT/PLAN:  S/P:  13 days s/p bilateral skin sparing mastectomy/right ALND/TEs for right breast cancer    The pathology was reviewed with Ms. Landrith and she was given a copy of the results. Because she had a positive lymph node, she will need radiation and we will refer her to the [place] office. Ms. Aromando is scheduled for lymphedema education. She will have a BIS and follow up in 9 months. She will follow up with plastic surgery for drain removal. She will continue follow up with Dr. Welton Flakes for medical oncology and he has ordered an Oncotype DX. Ms. Liedtke is to return to the clinic in 9 months for clinical exam. Her and her husband were given ample time to ask questions all of which were answered to their satisfaction.    I have reviewed the distress thermometer and needs assessment, including physical, psychological, social, spiritual and behavioral needs.  The patient completed the assessment during this visit to identify psychosocial needs that may interfere with the patient's plan of care.  I have made the following referrals based on this assessment and discussion with the patient:  Psychologist     1. Continue follow up with Dr. Welton Flakes  2. Refer to radiation at [facility] office  3. Refer to psychology  4. Continue follow up with  Dr. Fran Lowes  5. Lymphedema education scheduled  6. RTC in 9 months with BIS and lymphedema follow up    Guy Begin, PA-C    ATTESTATION:     I personally reviewed pathology, performed the breast exam and history, discussion, assessment, and treatment plan. The case was discussed with Andrew Au, PA-C and I have altered the documentation as needed to reflect my assessment and plan.    Renford Dills, MD

## 2019-09-03 NOTE — Progress Notes
Pt in office for drain #2 removal, reviewed log and below 30 cc. Removed IV3000, clipped suture and pulled drainage tube. Replaced IV3000 on both breast sides.   Pt has red streaking on right breast, pt has not placed any bandages on breast, advised pt to place telfa between breast and bra to see if related to friction from bra. Pt has follow up appt on Thursday.

## 2019-09-04 ENCOUNTER — Encounter: Admit: 2019-09-04 | Discharge: 2019-09-04 | Payer: BC Managed Care – PPO

## 2019-09-04 DIAGNOSIS — C50911 Malignant neoplasm of unspecified site of right female breast: Secondary | ICD-10-CM

## 2019-09-06 ENCOUNTER — Encounter: Admit: 2019-09-06 | Discharge: 2019-09-06 | Payer: BC Managed Care – PPO

## 2019-09-06 ENCOUNTER — Ambulatory Visit: Admit: 2019-09-06 | Discharge: 2019-09-06 | Payer: BC Managed Care – PPO

## 2019-09-06 DIAGNOSIS — E282 Polycystic ovarian syndrome: Secondary | ICD-10-CM

## 2019-09-06 DIAGNOSIS — J302 Other seasonal allergic rhinitis: Secondary | ICD-10-CM

## 2019-09-06 DIAGNOSIS — I10 Essential (primary) hypertension: Secondary | ICD-10-CM

## 2019-09-06 DIAGNOSIS — C50411 Malignant neoplasm of upper-outer quadrant of right female breast: Secondary | ICD-10-CM

## 2019-09-06 DIAGNOSIS — E8881 Metabolic syndrome: Secondary | ICD-10-CM

## 2019-09-06 DIAGNOSIS — C50919 Malignant neoplasm of unspecified site of unspecified female breast: Secondary | ICD-10-CM

## 2019-09-06 NOTE — Progress Notes
Subjective:       History of Present Illness  Jennifer Durham is a 44 y.o. female. Patient returns to clinic for follow-up. Remaining drains are not ready for removal. Patient is interested in autologous breast reconstruction in the future. Patient states her pain has been tolerable and well controlled.     Allergies   Allergen Reactions   ? Tegaderm RASH        Review of Systems   Constitutional: Negative.    HENT: Negative.    Eyes: Negative.    Respiratory: Negative.    Cardiovascular: Negative.    Gastrointestinal: Negative.    Endocrine: Negative.    Genitourinary: Negative.    Musculoskeletal: Negative.    Skin: Negative.    Allergic/Immunologic: Negative.    Neurological: Negative.    Hematological: Negative.    Psychiatric/Behavioral: Negative.      Medical History:   Diagnosis Date   ? Breast cancer (HCC) 07/04/2019    right   ? Hypertension    ? Insulin resistance    ? PCOS (polycystic ovarian syndrome)    ? Seasonal allergies     Frequent sinusitis     Surgical History:   Procedure Laterality Date   ? HX TONSILLECTOMY  2001    turbinates/adenoids 2015   ? HX TONSIL AND ADENOIDECTOMY  2011   ? LEFT PERONEAL TENDON REPAIR Left 12/15/2016    Performed by Vopat, Lowry Ram, MD at [facility]   ? Left Skin Sparing Mastectomy Left 08/21/2019    Performed by Renford Dills, MD at [facility]   ? IDENTIFICATION SENTINEL LYMPH NODE Right 08/21/2019    Performed by Renford Dills, MD at [facility]   ? INJECTION RADIOACTIVE TRACER FOR SENTINEL NODE IDENTIFICATION Right 08/21/2019    Performed by Renford Dills, MD at [facility]   ? RADIOLOGICAL EXAM SURGICAL SPECIMEN Right 08/21/2019    Performed by Renford Dills, MD at [facility]   ? MASTECTOMY - MODIFIED RADICAL WITH AXILLARY LYMPH NODE DISSECTION Right 08/21/2019    Performed by Renford Dills, MD at [facility]   ? RECONSTRUCTION BREAST WITH TISSUE EXPANDER AND SUBSEQUENT EXPANSION - IMMEDIATE/ DELAYED Bilateral 08/21/2019    Performed by Marlinda Mike, MD at [facility] ? IMPLANTATION BIOLOGIC IMPLANT FOR SOFT TISSUE REINFORCEMENT Bilateral 08/21/2019    Performed by Marlinda Mike, MD at [facility]     Family History   Problem Relation Age of Onset   ? Heart Attack Mother    ? Cancer Mother    ? Diabetes Mother    ? Cancer-Breast Mother 44   ? Heart Disease Mother    ? Hypertension Mother    ? Heart Disease Father    ? Hypothyroid Father    ? Hypertension Father    ? Thyroid Disease Father    ? Hypertension Brother    ? Cancer-Breast Maternal Aunt 50   ? Cancer-Ovarian Maternal Aunt 50   ? Diabetes Maternal Aunt    ? Arthritis-rheumatoid Maternal Aunt    ? Diabetes Maternal Grandmother    ? Heart Disease Maternal Grandfather      Social History     Socioeconomic History   ? Marital status: Married     Spouse name: Not on file   ? Number of children: Not on file   ? Years of education: Not on file   ? Highest education level: Not on file   Occupational History   ?  Not on file   Tobacco Use   ? Smoking status: Never Smoker   ? Smokeless tobacco: Never Used   Vaping Use   ? Vaping Use: Never used   Substance and Sexual Activity   ? Alcohol use: Yes     Alcohol/week: 1.0 standard drinks     Types: 1 Shots of liquor per week     Comment: 1x per month   ? Drug use: No   ? Sexual activity: Not on file   Other Topics Concern   ? Not on file   Social History Narrative   ? Not on file     Vaping/E-liquid Use   ? Vaping Use Never User      Objective:         ? diazePAM (VALIUM) 5 mg tablet Take one tablet by mouth every 6 hours as needed (muscle spasms).   ? doxycycline hyclate (VIBRACIN) 100 mg tablet Take one tablet by mouth twice daily for 21 days.   ? levonorgestrel (MIRENA) 20 mcg/24 hr (5 years) intrauterine device   1 EA, X 1 DOSE, Intrauteral, 1 EA, 0 Number of Refills, DEV   ? metFORMIN (GLUCOPHAGE) 1,000 mg tablet Take 1,000 mg by mouth twice daily with meals.   ? montelukast (SINGULAIR) 10 mg tablet Take 10 mg by mouth at bedtime daily.   ? triamterene-hydrochlorothiazide (MAXZIDE) 75-50 mg tablet Take 1 tablet by mouth daily.     Vitals:    09/06/19 1426   BP: 111/81   BP Source: Arm, Left Upper   Patient Position: Sitting   Pulse: 111   Temp: 36.3 ?C (97.4 ?F)   TempSrc: Skin   Weight: (!) 141.8 kg (312 lb 9.8 oz)   Height: 182.9 cm (72)   PainSc: Zero     Body mass index is 42.4 kg/m?Marland Kitchen     Physical Exam  Vitals reviewed.   Constitutional:       Appearance: Normal appearance. She is well-groomed.   HENT:      Head: Normocephalic.   Pulmonary:      Effort: Pulmonary effort is normal.   Chest:      Comments: Right breast incision healing appropriately. Left breast denuded skin superior to incision.   Skin:     General: Skin is warm and dry.      Findings: Bruising present.   Neurological:      Mental Status: She is alert.   Psychiatric:         Mood and Affect: Mood normal.         Behavior: Behavior normal. Behavior is cooperative.         Thought Content: Thought content normal.         Judgment: Judgment normal.           Left breast incision.        Assessment and Plan:  Patient s/p bilateral SSM and TE placement with mesh.  Remaining JP drains redressed with biopatch and IV3000  Xeroform and telfa to left breast.  RTC in one week.

## 2019-09-07 ENCOUNTER — Encounter: Admit: 2019-09-07 | Discharge: 2019-09-07 | Payer: BC Managed Care – PPO

## 2019-09-09 ENCOUNTER — Encounter: Admit: 2019-09-09 | Discharge: 2019-09-09 | Payer: BC Managed Care – PPO

## 2019-09-09 DIAGNOSIS — Z9189 Other specified personal risk factors, not elsewhere classified: Secondary | ICD-10-CM

## 2019-09-10 ENCOUNTER — Ambulatory Visit: Admit: 2019-09-10 | Discharge: 2019-09-10 | Payer: BC Managed Care – PPO

## 2019-09-10 ENCOUNTER — Encounter: Admit: 2019-09-10 | Discharge: 2019-09-10 | Payer: BC Managed Care – PPO

## 2019-09-10 NOTE — Progress Notes
LYMPHEDEMA DATASHEET-BASELINE    Obtained patient's verbal consent to treat them and their agreement to [facility] financial policy and NPP via this telehealth visit during the Coronavirus Public Health Emergency    DATE:  09/10/2019    PATIENT NAME:   Jennifer Durham  DATE OF BIRTH:   29-Oct-1975  MRN:   [id number]    Enrollment Visit Type:  Post-Operative    BASELINE DIAGNOSTIC ASSESSMENT    Diagnosis performed at [facility]?  No  Diagnostic Procedure:  Core Biopsy:  Ultrasound guided       Surgery performed at [facility]?   Yes  Surgical Procedure:  Axillary Node Dissection, Skin-sparing Mastecomy, SLN Mapping and Reconstruction      Plan:  Pending    BASELINE MEASUREMENTS  BIS = 2.4, right (obtained 08/21/19)  Handedness:  right handed    Medical Team:   Surgeon: Vear Clock  Plastic surgeon: Fran Lowes   Medical oncologist: Welton Flakes   Radiation oncologist: Isabella Stalling    Ms. Deese presents today via telehealth for post-operative lymphedema education.    Risk factors for the development of breast cancer treatment related lymphedema include:   1. Right Mastectomy  2. Right ALND   3. Radiation Therapy  4. Chemotherapy- pending   5. BMI > 30   6. Other medical comorbidity: HTN    Lymphedema teaching and care plan:     1. General review of Lymphedema pathophysiology in breast cancer patients. Patient reports no previous history or experience/personal knowledge of lymphedema. No barriers to learning noted, patient willing and active participant. Very interested in learning about topic, prevention. Thoughtful questions demonstrated good insight into etiology and prevention.      2. Baseline L-Dex Bioimpedence test (SOZO) completed 07/16/19.    3. Signs and Symptoms to watch for once initial symptoms from surgery have resolved reviewed including but not limited to: achiness, tingling, discomfort, or increased warmth in the hand, arm, chest, breast, or underarm areas. Feelings of fullness or heaviness in the hand, arm, chest, breast, or underarm. Tightness or decreased flexibility in nearby joints, such as the shoulder, elbow, wrist or hand. Trouble fitting the arm into a jacket or shirt sleeve that fit well before. Difficulty getting watches, rings, or bracelets on and off.    4. Risk Factors: BMI > 30, obesity and weight gain. Optimal weight management is ideal for overall health. Exercise when approved by surgeon may be resumed. Exercise is beneficial as well as the benefit of upper body strengthening. Precautions regarding meticulous skin care reviewed. This includes but not limited to sunscreen, burns, gardening, manicures, cuts, scrapes, signs of infection. Avoidance of finger sticks, needle sticks, blood pressures on affect arm and other applicable prevention discussion completed.     Reviewed ALND status along with additional risk reduction recommendations, including the use of a compression sleeve for air travel, exercise, lifting and activities that require repetitive arm movements. Compression sleeve instructions discussed. A prescription for a prophylactic compression sleeve, compression sleeve instructions handout, and list of area vendors provided via email on file. Instructed patient to wait until all surgical drains have been removed prior to being fitted for a compression sleeve.    5. When to call the office: Sudden swelling or swelling that appears very quickly. Hand, arm, or other body parts seem to suddenly ?blow up? may necessitate an evaluation.     6. RTC: Routine follow up with BIS surveillance. BIS to be coordinated with routine surgical follow ups. Written handouts regarding covered material provided prior to  time of today's visit via email on file.     Total time 30 minutes. Estimated counseling time 25 minutes regarding signs symptoms, risk factors, monitoring, when to contact us and follow up plan. Patient agreeable to follow up plan.     Provided patient with contact information for follow up should questions or concerns arise.

## 2019-09-11 ENCOUNTER — Encounter: Admit: 2019-09-11 | Discharge: 2019-09-11 | Payer: BC Managed Care – PPO

## 2019-09-11 DIAGNOSIS — Z20822 Encounter for screening laboratory testing for COVID-19 virus in asymptomatic patient: Secondary | ICD-10-CM

## 2019-09-11 DIAGNOSIS — E8881 Metabolic syndrome: Secondary | ICD-10-CM

## 2019-09-11 DIAGNOSIS — E282 Polycystic ovarian syndrome: Secondary | ICD-10-CM

## 2019-09-11 DIAGNOSIS — J302 Other seasonal allergic rhinitis: Secondary | ICD-10-CM

## 2019-09-11 DIAGNOSIS — C50919 Malignant neoplasm of unspecified site of unspecified female breast: Secondary | ICD-10-CM

## 2019-09-11 DIAGNOSIS — I10 Essential (primary) hypertension: Secondary | ICD-10-CM

## 2019-09-11 DIAGNOSIS — C50911 Malignant neoplasm of unspecified site of right female breast: Secondary | ICD-10-CM

## 2019-09-11 DIAGNOSIS — Z79899 Other long term (current) drug therapy: Secondary | ICD-10-CM

## 2019-09-11 MED ORDER — LORAZEPAM 0.5 MG PO TAB
ORAL_TABLET | 0 refills | PRN
Start: 2019-09-11 — End: ?

## 2019-09-11 MED ORDER — DEXAMETHASONE 4 MG PO TAB
ORAL_TABLET | INTRAMUSCULAR | 0 refills | 12.50000 days | Status: AC
Start: 2019-09-11 — End: ?

## 2019-09-11 MED ORDER — PROCHLORPERAZINE MALEATE 10 MG PO TAB
10 mg | ORAL_TABLET | ORAL | 1 refills | Status: AC
Start: 2019-09-11 — End: ?

## 2019-09-11 MED ORDER — DEXAMETHASONE 4 MG PO TAB
ORAL_TABLET | INTRAMUSCULAR | 0 refills | 14.00000 days | Status: DC
Start: 2019-09-11 — End: 2019-09-11

## 2019-09-12 ENCOUNTER — Encounter: Admit: 2019-09-12 | Discharge: 2019-09-12 | Payer: BC Managed Care – PPO

## 2019-09-13 ENCOUNTER — Encounter: Admit: 2019-09-13 | Discharge: 2019-09-13 | Payer: BC Managed Care – PPO

## 2019-09-13 ENCOUNTER — Ambulatory Visit: Admit: 2019-09-13 | Discharge: 2019-09-13 | Payer: BC Managed Care – PPO

## 2019-09-13 ENCOUNTER — Ambulatory Visit: Admit: 2019-09-13 | Discharge: 2019-09-14 | Payer: BC Managed Care – PPO

## 2019-09-13 DIAGNOSIS — C50919 Malignant neoplasm of unspecified site of unspecified female breast: Secondary | ICD-10-CM

## 2019-09-13 DIAGNOSIS — E8881 Metabolic syndrome: Secondary | ICD-10-CM

## 2019-09-13 DIAGNOSIS — C50411 Malignant neoplasm of upper-outer quadrant of right female breast: Secondary | ICD-10-CM

## 2019-09-13 DIAGNOSIS — E282 Polycystic ovarian syndrome: Secondary | ICD-10-CM

## 2019-09-13 DIAGNOSIS — J302 Other seasonal allergic rhinitis: Secondary | ICD-10-CM

## 2019-09-13 DIAGNOSIS — I10 Essential (primary) hypertension: Secondary | ICD-10-CM

## 2019-09-13 NOTE — Progress Notes
Subjective:       History of Present Illness  Jennifer Durham is a 44 y.o. female. Patient returns to clinic for follow-up. Patient is doing well. She has been applying xero and telfa to left breast wound. Remaining JP drains ready for removal.  Allergies   Allergen Reactions   ? Tegaderm RASH        Review of Systems   Constitutional: Negative.    HENT: Negative.    Eyes: Negative.    Respiratory: Negative.    Cardiovascular: Negative.    Gastrointestinal: Positive for nausea.   Endocrine: Negative.    Genitourinary: Negative.    Musculoskeletal: Negative.    Skin: Negative.    Allergic/Immunologic: Negative.    Neurological: Negative.    Hematological: Negative.    Psychiatric/Behavioral: The patient is nervous/anxious.         Panic attacks          Objective:         ? dexAMETHasone (DECADRON) 4 mg tablet Take with food. Take 2 tablets by mouth with breakfast and at noon the day before, the day of and the day after chemo. Repeat with next cycle.   ? diazePAM (VALIUM) 5 mg tablet Take one tablet by mouth every 6 hours as needed (muscle spasms).   ? levonorgestrel (MIRENA) 20 mcg/24 hr (5 years) intrauterine device   1 EA, X 1 DOSE, Intrauteral, 1 EA, 0 Number of Refills, DEV   ? LORazepam (ATIVAN) 0.5 mg tablet Take 1 tablet by mouth at night as needed for anxiety,  Nausea and insomnia.   ? metFORMIN (GLUCOPHAGE) 1,000 mg tablet Take 1,000 mg by mouth twice daily with meals.   ? montelukast (SINGULAIR) 10 mg tablet Take 10 mg by mouth at bedtime daily.   ? prochlorperazine maleate (COMPAZINE) 10 mg tablet Take one tablet by mouth every 6 hours.   ? triamterene-hydrochlorothiazide (MAXZIDE) 75-50 mg tablet Take 1 tablet by mouth daily.     Vitals:    09/13/19 0751   BP: 129/88   BP Source: Arm, Left Upper   Patient Position: Sitting   Pulse: 105   Temp: 36.2 ?C (97.2 ?F)   TempSrc: Skin   Weight: (!) 146 kg (321 lb 12.8 oz)   Height: 182.9 cm (72)   PainSc: Zero     Body mass index is 43.64 kg/m?Marland Kitchen Physical Exam  Vitals reviewed.   Constitutional:       Appearance: Normal appearance. She is well-groomed.   HENT:      Head: Normocephalic.   Pulmonary:      Effort: Pulmonary effort is normal.   Chest:      Comments: Bilateral breast incisions well approximated. Left breast wound superior to incision from previous blistering.   Skin:     General: Skin is warm and dry.   Neurological:      Mental Status: She is alert.   Psychiatric:         Mood and Affect: Mood normal.         Behavior: Behavior normal. Behavior is cooperative.         Thought Content: Thought content normal.         Judgment: Judgment normal.          Assessment and Plan:  Patient s/p bilateral SSM and TE placement.  Bilateral chest prepped with CHG. Using wing tip needle all air aspirated from bilateral TE. 250cc of sterile saline injected bilaterally.  Bilateral TE total  250cc.  Continue xeroform and telfa to left breast.  RTC weekly for TE fills.  MD visit in one month.

## 2019-09-14 ENCOUNTER — Encounter: Admit: 2019-09-14 | Discharge: 2019-09-14 | Payer: BC Managed Care – PPO

## 2019-09-14 MED ORDER — DOCETAXEL 500ML IVPB
75 mg/m2 | Freq: Once | INTRAVENOUS | 0 refills
Start: 2019-09-14 — End: ?

## 2019-09-14 MED ORDER — DEXAMETHASONE IVPB
12 mg | Freq: Once | INTRAVENOUS | 0 refills
Start: 2019-09-14 — End: ?

## 2019-09-14 MED ORDER — CYCLOPHOSPHAMIDE IVPB
600 mg/m2 | Freq: Once | INTRAVENOUS | 0 refills
Start: 2019-09-14 — End: ?

## 2019-09-14 MED ORDER — ONDANSETRON 8 MG PO TBDI
16 mg | Freq: Once | ORAL | 0 refills
Start: 2019-09-14 — End: ?

## 2019-09-19 ENCOUNTER — Encounter: Admit: 2019-09-19 | Discharge: 2019-09-19 | Payer: BC Managed Care – PPO

## 2019-09-19 DIAGNOSIS — J302 Other seasonal allergic rhinitis: Secondary | ICD-10-CM

## 2019-09-19 DIAGNOSIS — I10 Essential (primary) hypertension: Secondary | ICD-10-CM

## 2019-09-19 DIAGNOSIS — E282 Polycystic ovarian syndrome: Secondary | ICD-10-CM

## 2019-09-19 DIAGNOSIS — C50919 Malignant neoplasm of unspecified site of unspecified female breast: Secondary | ICD-10-CM

## 2019-09-19 DIAGNOSIS — C50911 Malignant neoplasm of unspecified site of right female breast: Secondary | ICD-10-CM

## 2019-09-19 DIAGNOSIS — E8881 Metabolic syndrome: Secondary | ICD-10-CM

## 2019-09-19 NOTE — Progress Notes
Chemotherapy Education  Provided patient with telehealth education regarding Docetaxel (Taxotere) + Cyclophosphamide for the treatment of breast cancer.    Reviewed planned treatment schedule. Patient will receive treatment as follows:  ? Docetaxel IV over one hour on Day 1  ? Cyclophosphamide IV over 30 minutes on Day 1     Potential adverse effects include (but are not limited to):  ? Low blood counts (explained associated risk for infection, bleeding, bruising, fatigue)   ? Nausea and vomiting (explained purpose of scheduled antiemetics before chemotherapy and the use of PRNs in the event of breakthrough nausea & vomiting)   ? Potential kidney toxicity and electrolyte abnormalities (explained the importance of maintaining oral intake and hydration)   ? Changes in lab tests (explained that the medical team will be monitoring lab values closely)   ? Hair loss   ? Potential peripheral neuropathy   ? Changes in bowel habits / poor appetite / mouth sores & mucositis / taste changes   ? Cumulative peripheral edema and fluid retention  ? Hemorrhagic cystitis related to cyclophosphamide (rare at the dose administered but patient advised to maintain hydration & be on the look-out for blood in the urine)    Discussed purpose and schedule of pre/post-chemotherapy steroids (dexamethasone).    Advised patient to inform health care team immediately in the event of any swelling, burning, pain, or redness at the infusion site, any trouble breathing, or any chest pain, so that interventions can be made as warranted for infusion and/or allergic reactions.    Advised patient that there is the potential for many drug interactions with chemotherapy regimens. Patient instructed to not start taking any other medications ? prescription, over-the-counter, herbal, or supplement ? without first talking to the health care team.    Patient voiced understanding about the provided information. All questions/concerns addressed at this time. Medication handout(s) provided via MyChart.    Allena Napoleon, PHARMD, BCOP

## 2019-09-19 NOTE — Progress Notes
Name: Jennifer Durham          MRN: [id number]      DOB: 1975/08/29      AGE: 44 y.o.   DATE OF SERVICE: 09/19/2019    Subjective:             Reason for Visit:  Follow Up    Obtained patient's verbal consent to treat them and their agreement to [place] financial policy and NPP via this telehealth visit during the [facility]      Jennifer Durham is a 44 y.o. female.     Cancer Staging  Malignant neoplasm of upper-outer quadrant of right breast in female, estrogen receptor positive (HCC)  Staging form: Breast, AJCC 8th Edition  - Clinical stage from 07/04/2019: Stage IA (cT1c, cN0(f), cM0, G2, ER+, PR+, HER2-) - Signed by Guy Begin, PA-C on 07/12/2019  - Pathologic stage from 08/21/2019: Stage IA (pT1c, pN1a, cM0, G2, ER+, PR+, HER2-) - Signed by Guy Begin, PA-C on 08/30/2019      History of Present Illness    DIAGNOSIS: right breast cancer   PAST ONCOLOGY HISTORY: [name] is a 44 year old pre menopausal female who noted right breast dimpling in May 2021. She was sent for diagnostic imaging at an outside facility on 07/04/19. At that time a new right breast mass was seen as well as a lymph node with cortical thickening and biopsy was recommended. Right breast biopsy 07/04/2019 [name]) showed invasive carcinoma with mixed ductal and lobular features, grade II, ER 91-100%, PR 91-100%, HER2 1+ IHC. Right axilla biopsy showed fragments of polymorphous lymphoid tissue and no malignancy seen. [name] underwent right modified radical mastectomy on 08/21/2019.  Tumor size was 1.8 cm, invasive ductal carcinoma.  Grade 2.  1 out of 34 lymph nodes was positive.  The size of LN metastasis was 5 mm.  ER was 96%, PR 99%,, HER-2 0+ by IHC.  Oncotype DX score 11.   ?  BREAST IMAGING:  Mammogram:    -- Bilateral diagnostic mammogram 07/04/19 [name]) revealed heterogeneously dense breast tissue. Spiculated mass with surrounding architectural distortion measuring up to 2 cm in the upper right medial breast at 10-11:00, 7.7 cm posterior to the nipple. Slightly prominent asymmetric right axillary lymph nodes.  -- Right diagnostic mammogram 07/16/19 ([facility]) revealed an the outside right MLO view from 07/04/2019, there was a small mass in the probable 9:00 position of the right breast at posterior depth, 2 cm inferior and 2 cm posterior to the known malignancy. Additional mammographic images will be performed to determine location. 2-D and 3-D images of the right breast were obtained.   In the 9:30 to 10:00 position of the right breast at middle to posterior depth, there was a 1.7 x 1.9 cm spiculated mass with internal tissue marker clip corresponding to biopsy-proven   malignancy. The biopsy-proven malignancy is approximately 8 cm posterior to the right nipple. In the 8:30 to 9:00 position of the right breast at posterior depth, there was an 0.8 cm lobular equal density mass which was not definitively seen on 2-D mammogram images from 03/21/2013.   ?  Ultrasound:    -- Right breast ultrasound 07/04/19 [facility]) revealed hypoechoic spiculated mass with surrounding architectural distortion with in the right upper breast at 10:00, 6 cm FTN. Significant posterior acoustic shadowing. Overall extent 1.3 x 1.7 x 1.6 cm. Increased cortical mantle thickening of a right axillary lymph node measuring up to 4 mm. BI-RADS 5.   --  Targeted right breast ultrasound 07/16/19 ([facility]) revealed at 9:30, 9 cm from the nipple, demonstrated a 1.4 x 1.8 x 1.6 cm irregular hypoechoic mass with internal tissue marker clip corresponding to the biopsy-proven malignancy. In the right breast at 9:00, 12 cm from the nipple, there ws an 0.8 cm morphologically normal small  intramammary lymph node corresponding to the additional mammographic mass identified. No suspicious right axillary lymph nodes were seen. 3 morphologically normal right axillary lymph nodes were identified. The lymph node labeled #3, likely contains the internal tissue marker clip from outside biopsy.                OB/GYN HISTORY: Age at onset of menstruation 13. G2P2. She had her first child at age 78. LMP 03/2016 due to Mirena placement. Uterus and ovaries intact.       [name] is here today for chemotherapy teaching.           Review of Systems   Constitutional: Positive for fatigue.   HENT: Positive for sinus pressure.    Genitourinary:        Pre-menopausal   LMP 03/2016  Mirena in place   Uterus and ovaries intact    Allergic/Immunologic: Positive for environmental allergies.       Allergies   Allergen Reactions   ? Tegaderm RASH       Objective:         ? acetaminophen (TYLENOL EXTRA STRENGTH) 500 mg tablet Take 1,000 mg by mouth every 6 hours as needed for Pain. Max of 4,000 mg of acetaminophen in 24 hours.   ? dexAMETHasone (DECADRON) 4 mg tablet Take with food. Take 2 tablets by mouth with breakfast and at noon the day before, the day of and the day after chemo. Repeat with next cycle.   ? levonorgestrel (MIRENA) 20 mcg/24 hr (5 years) intrauterine device   1 EA, X 1 DOSE, Intrauteral, 1 EA, 0 Number of Refills, DEV   ? LORazepam (ATIVAN) 0.5 mg tablet Take 1 tablet by mouth at night as needed for anxiety,  Nausea and insomnia.   ? metFORMIN (GLUCOPHAGE) 1,000 mg tablet Take 1,000 mg by mouth twice daily with meals.   ? montelukast (SINGULAIR) 10 mg tablet Take 10 mg by mouth at bedtime daily.   ? prochlorperazine maleate (COMPAZINE) 10 mg tablet Take one tablet by mouth every 6 hours.   ? triamterene-hydrochlorothiazide (MAXZIDE) 75-50 mg tablet Take 1 tablet by mouth daily.     There were no vitals filed for this visit.  There is no height or weight on file to calculate BMI.                  Pain Addressed:  N/A    Patient Evaluated for a Clinical Trial: No treatment clinical trial available for this patient.     [place] Cooperative Oncology Group performance status is 0, Fully active, able to carry on all pre-disease performance without restriction.Marland Kitchen     Physical Exam  Vitals and nursing note reviewed.   Pulmonary:      Effort: No respiratory distress.      Comments: No breast exam today due to telehealth   Chest:      Breasts:         Right: No mass, skin change or tenderness.         Left: No mass, skin change or tenderness.       Lymphadenopathy:      Cervical: No cervical adenopathy.  Assessment and Plan:    1. 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   2.  Serria underwent right modified radical mastectomy on 08/21/2019.  Tumor size was 1.8 cm, invasive ductal carcinoma.  Grade 2.  1 out of 34 lymph nodes was positive.  The size of LN metastasis was 5 mm.  ER was 96%, PR 99%,, HER-2 0+ by IHC. Oncotype Dx RS 11.   3.  Since Leianne is premenopausal and has node positive disease, we recommend adjuvant chemotherapy.  After discussing the options of AC-T chemotherapy versus TC chemotherapy, we decided to proceed with TC for four cycles.  We discussed the data based on ABC trials and breast cancer.  Darcee has significant history of heart disease in the family and is very concerned about Adriamycin causing heart damage.  Therefore TC seems like better choice with equivalent efficacy based on trials.       APP Chemotherapy Education    IV Chemotherapy: The following is a summary of the patient's IV Chemotherapy Education.    A thorough pre-assessment and teaching session explaining the mechanism of action, possible side effects, precautions and instructions regarding TC for curative intent was conducted. The patient will return on 09/26/2019 to initiate treatment. The cycle will repeat every 21 days for a total of 4 cycles. .  Plan of administration was reviewed.      Both written and verbal information were given to the patient.    The planned course of treatment, anticipated benefits, material risks and potential side effects that may occur with this course of treatment were explained to the patient.  Side effects and their management were discussed in detail and include, but are not limited to: myelosuppression, (with risks of infection and need for transfusions), alopecia, N/V, stomatitis, diarrhea, constipation, loss of appetite, hypersensitivity reaction, dehydration, fatigue, paresthesias/neuropathy, skin rash, increased risks of thrombosis, joint pain, allergic reaction and lung/cardiac/renal injury. I also discussed the potential need for transfusion of blood products. Yamilez had multiple questions and time was allowed to fully answer these to their satisfaction.  Patient verbalizes good understanding, wishes to proceed, and has previously signed consent.      Reproductive concerns were discussed with the patient  Infertility risks were explained    Appropriate handling of body secretions and waste at home were reviewed as applicable.    Prescriptions for supportive medications including nausea were e-scripted to their pharmacy and discussed in detail how to take. Drug to drug interactions were reviewed as applicable.     The patient has received contact information for the clinic and was instructed on when and who to call.     The patient verbalized understanding, was given the opportunity to ask questions, and the consent form was signed.     Follow up appointment with nurse practitioner in 09/26/2019.    Lab in 09/26/2019.    This was a 40 minute face to face encounter with 40 minutes spent in counseling and coordination of care.      4. After chemotherapy, we will recommend adjuvant endocrine therapy which will be tamoxifen with or without ovarian function suppression.  She has Mirena IUD which we will address after chemotherapy is completed.  She is seeing radiation oncology in the near future for radiation therapy recommendations.    5. I have reviewed the distress thermometer and needs assessment including physical, psychological, social, spiritual,  nutritional,  financial and educational needs. I have made the appropriate referrals.     6.  RTC on 09/26/2019 with labs and to start chemotherapy.     My collaborating MD Dr Raul Del.     For up to date information on the COVID-19 virus, visit the [facility] website. [email or web address]  ? General supportive care during cold and flu season and infection prevention reminders:   o Wash hands often with soap and water for at least 20 seconds  o Cover your mouth and nose  o Social distancing: try to maintain 6 feet between you and other people  o Stay home if sick and symptoms mild or manageable  - If you must be around people wear a mask    ? If you are having symptoms of a lower respiratory infection (cough, shortness of breath) and/or fever AND either traveled in last 30 days (internationally or to region of exposure) OR known exposure to patient with COVID19:    o Call your primary care provider for questions or health needs.   - Tell your doctor about your recent travel and your symptoms    o In a medical emergency, call 911 or go to the nearest emergency room.    Elenore Paddy Shaterra Sanzone, APRN-NP

## 2019-09-21 ENCOUNTER — Ambulatory Visit: Admit: 2019-09-21 | Discharge: 2019-09-21 | Payer: BC Managed Care – PPO

## 2019-09-21 ENCOUNTER — Encounter: Admit: 2019-09-21 | Discharge: 2019-09-21 | Payer: BC Managed Care – PPO

## 2019-09-21 DIAGNOSIS — C50411 Malignant neoplasm of upper-outer quadrant of right female breast: Secondary | ICD-10-CM

## 2019-09-21 NOTE — Progress Notes
Pt arrived for TE fill. While assessing for TE port's observed that right TE has flipped. Pt will see Lorin Glass, NP next Wednesday for possible TE flip.

## 2019-09-24 ENCOUNTER — Encounter: Admit: 2019-09-24 | Discharge: 2019-09-25 | Payer: BC Managed Care – PPO

## 2019-09-24 ENCOUNTER — Encounter: Admit: 2019-09-24 | Discharge: 2019-09-24 | Payer: BC Managed Care – PPO

## 2019-09-24 DIAGNOSIS — Z20822 Encounter for screening laboratory testing for COVID-19 virus in asymptomatic patient: Principal | ICD-10-CM

## 2019-09-26 ENCOUNTER — Encounter: Admit: 2019-09-26 | Discharge: 2019-09-26 | Payer: BC Managed Care – PPO

## 2019-09-26 ENCOUNTER — Ambulatory Visit: Admit: 2019-09-26 | Discharge: 2019-09-26 | Payer: BC Managed Care – PPO

## 2019-09-26 DIAGNOSIS — I10 Essential (primary) hypertension: Secondary | ICD-10-CM

## 2019-09-26 DIAGNOSIS — Z170 Estrogen receptor positive status [ER+]: Secondary | ICD-10-CM

## 2019-09-26 DIAGNOSIS — C779 Secondary and unspecified malignant neoplasm of lymph node, unspecified: Secondary | ICD-10-CM

## 2019-09-26 DIAGNOSIS — E282 Polycystic ovarian syndrome: Secondary | ICD-10-CM

## 2019-09-26 DIAGNOSIS — C50411 Malignant neoplasm of upper-outer quadrant of right female breast: Principal | ICD-10-CM

## 2019-09-26 DIAGNOSIS — C50911 Malignant neoplasm of unspecified site of right female breast: Secondary | ICD-10-CM

## 2019-09-26 DIAGNOSIS — Z9013 Acquired absence of bilateral breasts and nipples: Secondary | ICD-10-CM

## 2019-09-26 DIAGNOSIS — J302 Other seasonal allergic rhinitis: Secondary | ICD-10-CM

## 2019-09-26 DIAGNOSIS — E8881 Metabolic syndrome: Secondary | ICD-10-CM

## 2019-09-26 DIAGNOSIS — C50919 Malignant neoplasm of unspecified site of unspecified female breast: Secondary | ICD-10-CM

## 2019-09-26 MED ORDER — PEGFILGRASTIM-CBQV 6 MG/0.6 ML SC SYRG
6 mg | Freq: Once | SUBCUTANEOUS | 0 refills | Status: CP
Start: 2019-09-26 — End: ?
  Administered 2019-09-26: 23:00:00 6 mg via SUBCUTANEOUS

## 2019-09-26 MED ORDER — METHYLPREDNISOLONE SOD SUC(PF) 125 MG/2 ML IJ SOLR
125 mg | Freq: Once | INTRAVENOUS | 0 refills | Status: CP
Start: 2019-09-26 — End: ?
  Administered 2019-09-26: 20:00:00 125 mg via INTRAVENOUS

## 2019-09-26 MED ORDER — DIPHENHYDRAMINE HCL 50 MG/ML IJ SOLN
50 mg | Freq: Once | INTRAVENOUS | 0 refills | Status: CP
Start: 2019-09-26 — End: ?
  Administered 2019-09-26: 19:00:00 50 mg via INTRAVENOUS

## 2019-09-26 MED ORDER — FAMOTIDINE (PF) 20 MG/2 ML IV SOLN
20 mg | Freq: Once | INTRAVENOUS | 0 refills | Status: CP
Start: 2019-09-26 — End: ?
  Administered 2019-09-26: 19:00:00 20 mg via INTRAVENOUS

## 2019-09-26 MED ORDER — ONDANSETRON 8 MG PO TBDI
16 mg | Freq: Once | ORAL | 0 refills | Status: CP
Start: 2019-09-26 — End: ?
  Administered 2019-09-26: 19:00:00 16 mg via ORAL

## 2019-09-26 MED ORDER — DEXAMETHASONE IVPB
12 mg | Freq: Once | INTRAVENOUS | 0 refills | Status: CP
Start: 2019-09-26 — End: ?
  Administered 2019-09-26 (×2): 12 mg via INTRAVENOUS

## 2019-09-26 MED ORDER — ALBUTEROL SULFATE 2.5 MG/0.5 ML IN NEBU
2.5 mg | Freq: Once | RESPIRATORY_TRACT | 0 refills | Status: AC
Start: 2019-09-26 — End: ?

## 2019-09-26 MED ORDER — DOCETAXEL 500ML IVPB
75 mg/m2 | Freq: Once | INTRAVENOUS | 0 refills | Status: CP
Start: 2019-09-26 — End: ?
  Administered 2019-09-26 (×2): 204 mg via INTRAVENOUS

## 2019-09-26 MED ORDER — SODIUM CHLORIDE 0.9 % IV SOLP
500 mL | INTRAVENOUS | 0 refills | Status: CP
Start: 2019-09-26 — End: ?
  Administered 2019-09-26: 19:00:00 500 mL via INTRAVENOUS

## 2019-09-26 MED ORDER — CYCLOPHOSPHAMIDE IVPB
600 mg/m2 | Freq: Once | INTRAVENOUS | 0 refills | Status: CP
Start: 2019-09-26 — End: ?
  Administered 2019-09-26 (×2): 1600 mg via INTRAVENOUS

## 2019-09-26 NOTE — Progress Notes
Chemotherapy Reaction    Patient: Jennifer Durham is a 44 y.o. female    MRN#:  [id number]    Chemotherapy Cycle 1 Day 1    Agent: docetaxel    Route: IV    Time Started: 1420    Time of Reaction: 1425    Rate at Time of Reaction: 520 mL/hr    Signs & Symptoms of Reaction: flushing, back pain, coughing/wheezing and chest pain    Medications Administered: normal saline bolus, diphenhydramine, famotidine and methylprednisolone    Provider Notification Gershon Mussel APRN [id number]    Time of Symptom Resolution: 1430.     Intervention/Outcome: Infusion restarted once symptoms fully subsided at  1534. Infusion restarted at 1/2 rate 260 ml/hr for 30 minutes then titrated to full rate. Pt tolerated remainder of infusion with no known adverse events.    Allergy List Updated: No

## 2019-09-26 NOTE — Progress Notes
Name: Jennifer Durham          MRN: [id number]      DOB: [birth year]      AGE: 44 y.o.   DATE OF SERVICE: 09/26/2019    Subjective:             Reason for Visit:  Follow Up        Jennifer Durham is a 44 y.o. female.     Cancer Staging  Malignant neoplasm of upper-outer quadrant of right breast in female, estrogen receptor positive (HCC)  Staging form: Breast, AJCC 8th Edition  - Clinical stage from 07/04/2019: Stage IA (cT1c, cN0(f), cM0, G2, ER+, PR+, HER2-) - Signed by Guy Begin, PA-C on 07/12/2019  - Pathologic stage from 08/21/2019: Stage IA (pT1c, pN1a, cM0, G2, ER+, PR+, HER2-) - Signed by Guy Begin, PA-C on 08/30/2019      History of Present Illness    DIAGNOSIS: right breast cancer   PAST ONCOLOGY HISTORY: Jennifer Durham is a 44 year old pre menopausal female who noted right breast dimpling in May 2021. She was sent for diagnostic imaging at an outside facility on 07/04/19. At that time a new right breast mass was seen as well as a lymph node with cortical thickening and biopsy was recommended. Right breast biopsy 07/04/2019 [name]) showed invasive carcinoma with mixed ductal and lobular features, grade II, ER 91-100%, PR 91-100%, HER2 1+ IHC. Right axilla biopsy showed fragments of polymorphous lymphoid tissue and no malignancy seen. Jennifer Durham underwent right modified radical mastectomy on 08/21/2019.  Tumor size was 1.8 cm, invasive ductal carcinoma.  Grade 2.  1 out of 34 lymph nodes was positive.  The size of LN metastasis was 5 mm.  ER was 96%, PR 99%,, HER-2 0+ by IHC.  Oncotype DX score 11.   ?  BREAST IMAGING:  Mammogram:    -- Bilateral diagnostic mammogram 07/04/19 [name]) revealed heterogeneously dense breast tissue. Spiculated mass with surrounding architectural distortion measuring up to 2 cm in the upper right medial breast at 10-11:00, 7.7 cm posterior to the nipple. Slightly prominent asymmetric right axillary lymph nodes.  -- Right diagnostic mammogram 07/16/19 ([place]) revealed an the outside right MLO view from 07/04/2019, there was a small mass in the probable 9:00 position of the right breast at posterior depth, 2 cm inferior and 2 cm posterior to the known malignancy. Additional mammographic images will be performed to determine location. 2-D and 3-D images of the right breast were obtained.   In the 9:30 to 10:00 position of the right breast at middle to posterior depth, there was a 1.7 x 1.9 cm spiculated mass with internal tissue marker clip corresponding to biopsy-proven   malignancy. The biopsy-proven malignancy is approximately 8 cm posterior to the right nipple. In the 8:30 to 9:00 position of the right breast at posterior depth, there was an 0.8 cm lobular equal density mass which was not definitively seen on 2-D mammogram images from 03/21/2013.   ?  Ultrasound:    -- Right breast ultrasound 07/04/19 [facility]) revealed hypoechoic spiculated mass with surrounding architectural distortion with in the right upper breast at 10:00, 6 cm FTN. Significant posterior acoustic shadowing. Overall extent 1.3 x 1.7 x 1.6 cm. Increased cortical mantle thickening of a right axillary lymph node measuring up to 4 mm. BI-RADS 5.   -- Targeted right breast ultrasound 07/16/19 ([place]) revealed at 9:30, 9 cm from the nipple, demonstrated a 1.4 x 1.8 x 1.6 cm irregular hypoechoic  mass with internal tissue marker clip corresponding to the biopsy-proven malignancy. In the right breast at 9:00, 12 cm from the nipple, there ws an 0.8 cm morphologically normal small  intramammary lymph node corresponding to the additional mammographic mass identified. No suspicious right axillary lymph nodes were seen. 3 morphologically normal right axillary lymph nodes were identified. The lymph node labeled #3, likely contains the internal tissue marker clip from outside biopsy.              OB/GYN HISTORY: Age at onset of menstruation 13. G2P2. She had her first child at age 30. LMP 03/2016 due to Mirena placement. Uterus and ovaries intact.     PRESENT THERAPY: adjuvant TC every 3 weeks x4 started 09/26/2019    Jennifer Durham is here today for cycle #1/4 TC.           Review of Systems   Genitourinary:        Pre-menopausal   LMP 03/2016  Mirena in place   Uterus and ovaries intact    Allergic/Immunologic: Positive for environmental allergies.       Allergies   Allergen Reactions   ? Tegaderm RASH       Objective:         ? acetaminophen (TYLENOL EXTRA STRENGTH) 500 mg tablet Take 1,000 mg by mouth every 6 hours as needed for Pain. Max of 4,000 mg of acetaminophen in 24 hours.   ? dexAMETHasone (DECADRON) 4 mg tablet Take with food. Take 2 tablets by mouth with breakfast and at noon the day before, the day of and the day after chemo. Repeat with next cycle.   ? levonorgestrel (MIRENA) 20 mcg/24 hr (5 years) intrauterine device   1 EA, X 1 DOSE, Intrauteral, 1 EA, 0 Number of Refills, DEV   ? LORazepam (ATIVAN) 0.5 mg tablet Take 1 tablet by mouth at night as needed for anxiety,  Nausea and insomnia.   ? metFORMIN (GLUCOPHAGE) 1,000 mg tablet Take 1,000 mg by mouth twice daily with meals.   ? montelukast (SINGULAIR) 10 mg tablet Take 10 mg by mouth at bedtime daily.   ? prochlorperazine maleate (COMPAZINE) 10 mg tablet Take one tablet by mouth every 6 hours.   ? triamterene-hydrochlorothiazide (MAXZIDE) 75-50 mg tablet Take 1 tablet by mouth daily.     Vitals:    09/26/19 1131   BP: 133/86   BP Source: Arm, Left Upper   Patient Position: Sitting   Pulse: 104   Resp: 16   Temp: 36.7 ?C (98 ?F)   SpO2: 99%   Weight: (!) 144.2 kg (317 lb 12.8 oz)   Height: 182.9 cm (72.01)   PainSc: Zero     Body mass index is 43.09 kg/m?Marland Kitchen     Pain Score: Zero       Fatigue Scale: 0-None    Pain Addressed:  N/A    Patient Evaluated for a Clinical Trial: No treatment clinical trial available for this patient.     [place] Cooperative Oncology Group performance status is 0, Fully active, able to carry on all pre-disease performance without restriction.Marland Kitchen     Physical Exam  Vitals and nursing note reviewed.   Pulmonary:      Effort: No respiratory distress.   Chest:      Breasts:         Right: No mass, skin change or tenderness.         Left: No mass, skin change or tenderness.  Lymphadenopathy:      Cervical: No cervical adenopathy.       LABS:  CBC w diff    Lab Results   Component Value Date/Time    WBC 20.1 (H) 09/26/2019 11:07 AM    RBC 3.69 (L) 09/26/2019 11:07 AM    HGB 10.9 (L) 09/26/2019 11:07 AM    HCT 32.0 (L) 09/26/2019 11:07 AM    MCV 86.8 09/26/2019 11:07 AM    MCH 29.6 09/26/2019 11:07 AM    MCHC 34.1 09/26/2019 11:07 AM    RDW 14.9 09/26/2019 11:07 AM    PLTCT 338 09/26/2019 11:07 AM    MPV 7.5 09/26/2019 11:07 AM    Lab Results   Component Value Date/Time    NEUT 90 (H) 09/26/2019 11:07 AM    ANC 18.00 (H) 09/26/2019 11:07 AM    LYMA 7 (L) 09/26/2019 11:07 AM    ALC 1.40 09/26/2019 11:07 AM    MONA 3 (L) 09/26/2019 11:07 AM    AMC 0.70 09/26/2019 11:07 AM    EOSA 0 09/26/2019 11:07 AM    AEC 0.00 09/26/2019 11:07 AM    BASA 0 09/26/2019 11:07 AM    ABC 0.00 09/26/2019 11:07 AM        Comprehensive Metabolic Profile    Lab Results   Component Value Date/Time    NA 138 09/26/2019 11:07 AM    K 3.4 (L) 09/26/2019 11:07 AM    CL 102 09/26/2019 11:07 AM    CO2 25 09/26/2019 11:07 AM    GAP 11 09/26/2019 11:07 AM    BUN 15 09/26/2019 11:07 AM    CR 1.12 (H) 09/26/2019 11:07 AM    GLU 150 (H) 09/26/2019 11:07 AM    Lab Results   Component Value Date/Time    CA 9.5 09/26/2019 11:07 AM    ALBUMIN 4.4 09/26/2019 11:07 AM    TOTPROT 7.4 09/26/2019 11:07 AM    ALKPHOS 43 09/26/2019 11:07 AM    AST 14 09/26/2019 11:07 AM    ALT 12 09/26/2019 11:07 AM    TOTBILI 0.4 09/26/2019 11:07 AM    GFR 53 (L) 09/26/2019 11:07 AM    GFRAA >60 09/26/2019 11:07 AM                  Assessment and Plan:    1. 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 2.  Jennifer Durham underwent right modified radical mastectomy on 08/21/2019.  Tumor size was 1.8 cm, invasive ductal carcinoma.  Grade 2.  1 out of 34 lymph nodes was positive.  The size of LN metastasis was 5 mm.  ER was 96%, PR 99%,, HER-2 0+ by IHC. Oncotype Dx RS 11.   3.  Since Jennifer Durham is premenopausal and has node positive disease, we recommend adjuvant chemotherapy.  After discussing the options of AC-T chemotherapy versus TC chemotherapy, we decided to proceed with TC for four cycles.  We discussed the data based on ABC trials and breast cancer.  Avantika has significant history of heart disease in the family and is very concerned about Adriamycin causing heart damage.  Therefore TC seems like better choice with equivalent efficacy based on trials.  She will receive cycle #1/4 TC today.     4. After chemotherapy, we will recommend adjuvant endocrine therapy which will be tamoxifen with or without ovarian function suppression.  She has Mirena IUD  which we will address after chemotherapy is completed.  She is seeing radiation oncology in the near future for radiation therapy recommendations.  5.  RTC in 3 weeks with labs and chemotherapy.     My collaborating MD Dr Raul Del.     For up to date information on the COVID-19 virus, visit the [facility] website. [email or web address]  ? General supportive care during cold and flu season and infection prevention reminders:   o Wash hands often with soap and water for at least 20 seconds  o Cover your mouth and nose  o Social distancing: try to maintain 6 feet between you and other people  o Stay home if sick and symptoms mild or manageable  - If you must be around people wear a mask    ? If you are having symptoms of a lower respiratory infection (cough, shortness of breath) and/or fever AND either traveled in last 30 days (internationally or to region of exposure) OR known exposure to patient with COVID19:    o Call your primary care provider for questions or health needs.   - Tell your doctor about your recent travel and your symptoms    o In a medical emergency, call 911 or go to the nearest emergency room.    Jennifer Paddy Katelen Luepke, APRN-NP

## 2019-09-26 NOTE — Progress Notes
.  CHEMO NOTE  Verified chemo consent signed and in chart.    Verified initiate chemo order in [facility]2    Blood return positive via: Port (Single)    BSA and dose double checked (agree with orders as written) with: yes see mra     Labs/applicable tests checked: CBC and Comprehensive Metabolic Panel (CMP)    Chemo regime: Drug/cycle/day C1D1  DOCEtaxeL (TAXOTERE) 204 mg in sodium chloride 0.9% (NS) 520.4 mL IVPB (non-DEHP)   [[id number]]  cyclophosphamide (CYTOXAN) 1,600 mg in sodium chloride 0.9% (NS) 330 mL IVPB   [[id number]]  Rate verified and armband double checkwith second RN: yes    Patient education offered and stated understanding. Denies questions at this time.  Pt arrived to cc tx for C1D1 taxotere/cytoxan. Pt oriented to cancer tx area. Pt seen and assessed in exam by Gershon Mussel APRN. PIV flushed positive for blood return. At 1425 approximately 5 minutes into taxotere infusion. Pt flushed, coughing, stating chest tightness and back pain. VSS. See doc flowsheet. NS bolus initiated, 50mg  IV benadryl given, 20mg  IV pepcid given. See reaction note. Stepahnie Lafaver APRN paged. Orders to restart infusion at 1/2 rate once symptoms subside. Orders to give 125mg  IV solumedrol. Infusion restarted at 1534, administered at 1/2 rate for 30 minutes then increased to full rate. Pt tolerated completion of infusion with no additional adverse events. Pt tolerated cytoxan with no known adverse events. PIV flushed, positive for blood return and removed. Patient received Udenyca and tolerated without difficulty. Pt given copy AVS with 24/7 number. Pt left ambulatory in stable condition and denied any further needs.

## 2019-09-26 NOTE — Patient Instructions
[facility]  Chemotherapy Instructions    Jennifer Durham     Call Immediately to report the following:  Unexplained bleeding or bleeding that will not stop  Difficulty swallowing  Shortness of breath, wheezing, or trouble breathing  Rapid, irregular heartbeat; chest pain  Dizziness, lightheadedness  Rash or cut that swells or turns red, feels hot or painful, or begin to ooze  Diarrhea   Uncontrolled nausea or vomiting  Fever of 100.4 F or higher, or chills    Important Phone Numbers:  Cancer Center Main Number (answered 24 hours a day) [phone]  Cancer Center Scheduling (appointments) [phone]  Social Worker [phone]

## 2019-09-26 NOTE — Progress Notes
Subjective:       History of Present Illness  Jennifer Durham is a 44 y.o. female. Patient presents to clinic today for evaluation of her right breast tissue expander. Patient was unable to receive a fill of her TEs due to suspicion of flipped device. Patient states she will begin chemotherapy today. Planning for treatment 09/26/2019, 10/17/2019, 11/07/2019 and 11/28/2019. Discussed that she would like to have her TEs filled prior to chemotherapy treatments.  Allergies   Allergen Reactions   ? Tegaderm RASH        Review of Systems   Constitutional: Negative.    HENT: Negative.    Eyes: Negative.    Respiratory: Negative.    Cardiovascular: Negative.    Gastrointestinal: Negative.    Endocrine: Negative.    Genitourinary: Negative.    Musculoskeletal: Negative.    Skin: Negative.    Allergic/Immunologic: Negative.    Neurological: Negative.    Hematological: Negative.    Psychiatric/Behavioral: Negative.      Medical History:   Diagnosis Date   ? Breast cancer (HCC) 07/04/2019    right   ? Hypertension    ? Insulin resistance    ? PCOS (polycystic ovarian syndrome)    ? Seasonal allergies     Frequent sinusitis     Surgical History:   Procedure Laterality Date   ? HX TONSILLECTOMY  2001    turbinates/adenoids 2015   ? HX TONSIL AND ADENOIDECTOMY  2011   ? LEFT PERONEAL TENDON REPAIR Left 12/15/2016    Performed by Vopat, Lowry Ram, MD at [facility]   ? Left Skin Sparing Mastectomy Left 08/21/2019    Performed by Renford Dills, MD at [facility]   ? IDENTIFICATION SENTINEL LYMPH NODE Right 08/21/2019    Performed by Renford Dills, MD at [facility]   ? INJECTION RADIOACTIVE TRACER FOR SENTINEL NODE IDENTIFICATION Right 08/21/2019    Performed by Renford Dills, MD at [facility]   ? RADIOLOGICAL EXAM SURGICAL SPECIMEN Right 08/21/2019    Performed by Renford Dills, MD at [facility]   ? MASTECTOMY - MODIFIED RADICAL WITH AXILLARY LYMPH NODE DISSECTION Right 08/21/2019    Performed by Renford Dills, MD at [facility] ? RECONSTRUCTION BREAST WITH TISSUE EXPANDER AND SUBSEQUENT EXPANSION - IMMEDIATE/ DELAYED Bilateral 08/21/2019    Performed by Marlinda Mike, MD at [facility]   ? IMPLANTATION BIOLOGIC IMPLANT FOR SOFT TISSUE REINFORCEMENT Bilateral 08/21/2019    Performed by Marlinda Mike, MD at [facility]     Family History   Problem Relation Age of Onset   ? Heart Attack Mother    ? Cancer Mother    ? Diabetes Mother    ? Cancer-Breast Mother 44   ? Heart Disease Mother    ? Hypertension Mother    ? Heart Disease Father    ? Hypothyroid Father    ? Hypertension Father    ? Thyroid Disease Father    ? Hypertension Brother    ? Cancer-Breast Maternal Aunt 50   ? Cancer-Ovarian Maternal Aunt 50   ? Diabetes Maternal Aunt    ? Arthritis-rheumatoid Maternal Aunt    ? Diabetes Maternal Grandmother    ? Heart Disease Maternal Grandfather      Social History     Socioeconomic History   ? Marital status: Married     Spouse name: Not on file   ? Number of children: Not on file   ?  Years of education: Not on file   ? Highest education level: Not on file   Occupational History   ? Not on file   Tobacco Use   ? Smoking status: Never Smoker   ? Smokeless tobacco: Never Used   Vaping Use   ? Vaping Use: Never used   Substance and Sexual Activity   ? Alcohol use: Yes     Alcohol/week: 1.0 standard drinks     Types: 1 Shots of liquor per week     Comment: 1x per month   ? Drug use: No   ? Sexual activity: Not on file   Other Topics Concern   ? Not on file   Social History Narrative   ? Not on file     Vaping/E-liquid Use   ? Vaping Use Never User                   Objective:         ? acetaminophen (TYLENOL EXTRA STRENGTH) 500 mg tablet Take 1,000 mg by mouth every 6 hours as needed for Pain. Max of 4,000 mg of acetaminophen in 24 hours.   ? dexAMETHasone (DECADRON) 4 mg tablet Take with food. Take 2 tablets by mouth with breakfast and at noon the day before, the day of and the day after chemo. Repeat with next cycle.   ? levonorgestrel (MIRENA) 20 mcg/24 hr (5 years) intrauterine device   1 EA, X 1 DOSE, Intrauteral, 1 EA, 0 Number of Refills, DEV   ? LORazepam (ATIVAN) 0.5 mg tablet Take 1 tablet by mouth at night as needed for anxiety,  Nausea and insomnia.   ? metFORMIN (GLUCOPHAGE) 1,000 mg tablet Take 1,000 mg by mouth twice daily with meals.   ? montelukast (SINGULAIR) 10 mg tablet Take 10 mg by mouth at bedtime daily.   ? prochlorperazine maleate (COMPAZINE) 10 mg tablet Take one tablet by mouth every 6 hours.   ? triamterene-hydrochlorothiazide (MAXZIDE) 75-50 mg tablet Take 1 tablet by mouth daily.     Vitals:    09/26/19 0849   BP: 133/86   BP Source: Arm, Left Upper   Patient Position: Sitting   Pulse: 104   Weight: (!) 142.9 kg (315 lb)   Height: 182.9 cm (72)   PainSc: Zero     Body mass index is 42.72 kg/m?Marland Kitchen     Physical Exam  Vitals reviewed.   Constitutional:       Appearance: Normal appearance. She is well-groomed.   HENT:      Head: Normocephalic.   Pulmonary:      Effort: Pulmonary effort is normal.   Chest:      Comments: Right breast incision well healed - right breast TE is flipped. Improvement of left breast superficial wound.  Skin:     General: Skin is warm and dry.   Neurological:      Mental Status: She is alert.   Psychiatric:         Mood and Affect: Mood normal.         Behavior: Behavior normal. Behavior is cooperative.         Thought Content: Thought content normal.         Judgment: Judgment normal.          Assessment and Plan:  Patient is s/p bilateral SSM and TE placement with mesh.   Right breast TE flipped to proper orientation manually in the office.   Bilateral chest prepped with  CHG and using a wing tip needle 250cc of sterile saline.   Bilateral TE total 500cc    Encouraged patient to return prior to her next chemotherapy treatment.

## 2019-09-27 ENCOUNTER — Encounter: Admit: 2019-09-27 | Discharge: 2019-09-27 | Payer: BC Managed Care – PPO

## 2019-09-27 NOTE — Progress Notes
Clinical Nutrition Assessment Summary    Jennifer Durham is a 44 y.o. 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 She reports good appetite/intakes for the most part.  I reviewed healthy, plant-based diet with increased protein/hydration needs. I answered all questions during our talk and will remain available PRN.    Nutrition Diagnosis:  Food & nutrition-related knowledge deficit      Etiology: recommendations for new breast cancer and treatment      Signs & Symptoms: 2nd touch appt    Intervention/Plan:  ? Discussed nutritional/lifestyle guidelines for breast cancer, along with calorie and protein needs.  ? We discussed importance of preventing weight gain and working toward healthier weight as to decrease risk for comorbidities and cancer recurrence.  ? Encouraged small, frequent meals and healthful snacks. Patient was provided ACS booklet with tips for managing side effects of chemo.   ? A Plant based diet was encouraged; recommending 1? - 2 cups of fruits and 2 - 3 cups of vegetables.  ? We discussed healthy diet in relation to weight control. I educated the patient on MyPlate eating plan (1/4 plate lean protein, 1/4 plate starches, 1/2 plate low starch veggies). We discussed appropriate portion sizes. Aim for at least 5 servings of fruits and vegetables per day. Also focus on whole grains to obtain 25-30g fiber per day. Include more lean protein sources such as fish, poultry, legumes verses red and processed meat. Patient was provided with handout. Provided patient with a 1600 calorie meal plan, tips for increasing fruits/vegetables and healthy protein shake recipes to be used for healthy snacks or meal replacements.   ? Discussed limiting red and processed meats to AICR recommendation of less than 18 ounces per week.   ? Physical activity was encouraged as strategy for weight control and healthy lifestyle behavior changes.   ? I reinforced need for adequate hydration during chemotherapy.  ? Patient was provided with RD's contact information for further assistance if needed. Will be available PRN.    Nutrition Monitoring and Evaluation:  Goal: weight maintenance; high protein diet  Time Frame: ongoing    The patient was allowed to ask questions and actively participated in creating plan of care. I provided the patient with my contact information and instructed pt on how to get in touch with me if questions or concerns arise. Thank you for allowing nutrition services to participate in this patients care.     Follow up Date: as needed    Fredderick Phenix, MS, RD, CSO, LD   Certified Specialist in Oncology Nutrition  Phone: [phone]

## 2019-10-09 ENCOUNTER — Encounter: Admit: 2019-10-09 | Discharge: 2019-10-09 | Payer: BC Managed Care – PPO

## 2019-10-09 MED ORDER — DOCETAXEL 500ML IVPB
75 mg/m2 | Freq: Once | INTRAVENOUS | 0 refills
Start: 2019-10-09 — End: ?

## 2019-10-11 ENCOUNTER — Encounter: Admit: 2019-10-11 | Discharge: 2019-10-11 | Payer: BC Managed Care – PPO

## 2019-10-17 ENCOUNTER — Encounter: Admit: 2019-10-17 | Discharge: 2019-10-17 | Payer: BC Managed Care – PPO

## 2019-10-17 ENCOUNTER — Ambulatory Visit: Admit: 2019-10-17 | Discharge: 2019-10-18 | Payer: BC Managed Care – PPO

## 2019-10-17 DIAGNOSIS — J302 Other seasonal allergic rhinitis: Secondary | ICD-10-CM

## 2019-10-17 DIAGNOSIS — I10 Essential (primary) hypertension: Secondary | ICD-10-CM

## 2019-10-17 DIAGNOSIS — C50411 Malignant neoplasm of upper-outer quadrant of right female breast: Secondary | ICD-10-CM

## 2019-10-17 DIAGNOSIS — E282 Polycystic ovarian syndrome: Secondary | ICD-10-CM

## 2019-10-17 DIAGNOSIS — C50919 Malignant neoplasm of unspecified site of unspecified female breast: Secondary | ICD-10-CM

## 2019-10-17 DIAGNOSIS — E8881 Metabolic syndrome: Secondary | ICD-10-CM

## 2019-10-17 DIAGNOSIS — C50911 Malignant neoplasm of unspecified site of right female breast: Secondary | ICD-10-CM

## 2019-10-17 MED ORDER — DIPHENHYDRAMINE HCL 50 MG/ML IJ SOLN
50 mg | Freq: Once | INTRAVENOUS | 0 refills | Status: CP
Start: 2019-10-17 — End: ?
  Administered 2019-10-17: 16:00:00 50 mg via INTRAVENOUS

## 2019-10-17 MED ORDER — PEGFILGRASTIM-CBQV 6 MG/0.6 ML SC SYRG
6 mg | Freq: Once | SUBCUTANEOUS | 0 refills | Status: CP
Start: 2019-10-17 — End: ?
  Administered 2019-10-17: 19:00:00 6 mg via SUBCUTANEOUS

## 2019-10-17 MED ORDER — FAMOTIDINE (PF) 20 MG/2 ML IV SOLN
20 mg | Freq: Once | INTRAVENOUS | 0 refills | Status: CP
Start: 2019-10-17 — End: ?
  Administered 2019-10-17: 16:00:00 20 mg via INTRAVENOUS

## 2019-10-17 MED ORDER — ONDANSETRON 8 MG PO TBDI
16 mg | Freq: Once | ORAL | 0 refills | Status: CP
Start: 2019-10-17 — End: ?
  Administered 2019-10-17: 16:00:00 16 mg via ORAL

## 2019-10-17 MED ORDER — DOCETAXEL 500ML IVPB
75 mg/m2 | Freq: Once | INTRAVENOUS | 0 refills | Status: CP
Start: 2019-10-17 — End: ?
  Administered 2019-10-17 (×2): 204 mg via INTRAVENOUS

## 2019-10-17 MED ORDER — PROCHLORPERAZINE MALEATE 10 MG PO TAB
10 mg | ORAL_TABLET | ORAL | 1 refills | Status: AC
Start: 2019-10-17 — End: ?

## 2019-10-17 MED ORDER — CYCLOPHOSPHAMIDE IVPB
600 mg/m2 | Freq: Once | INTRAVENOUS | 0 refills | Status: CP
Start: 2019-10-17 — End: ?
  Administered 2019-10-17 (×2): 1600 mg via INTRAVENOUS

## 2019-10-17 MED ORDER — DEXAMETHASONE IVPB
12 mg | Freq: Once | INTRAVENOUS | 0 refills | Status: CP
Start: 2019-10-17 — End: ?
  Administered 2019-10-17 (×2): 12 mg via INTRAVENOUS

## 2019-10-17 NOTE — Progress Notes
Subjective:       History of Present Illness  Jennifer Durham is a 44 y.o. female. Patient presents to clinic for additional bilateral TE fill prior to her second chemo. Patient states she had an allergic reaction to her chemo treatment. Patient states she will require radiation therapy.  Allergies   Allergen Reactions   ? Tegaderm RASH        Review of Systems   Constitutional: Negative.    HENT: Negative.    Eyes: Negative.    Respiratory: Negative.    Cardiovascular: Negative.    Gastrointestinal: Negative.    Endocrine: Negative.    Genitourinary: Negative.    Musculoskeletal: Negative.    Skin: Negative.    Allergic/Immunologic: Negative.    Neurological: Negative.    Hematological: Negative.    Psychiatric/Behavioral: Negative.          Objective:         ? acetaminophen (TYLENOL EXTRA STRENGTH) 500 mg tablet Take 1,000 mg by mouth every 6 hours as needed for Pain. Max of 4,000 mg of acetaminophen in 24 hours.   ? dexAMETHasone (DECADRON) 4 mg tablet Take with food. Take 2 tablets by mouth with breakfast and at noon the day before, the day of and the day after chemo. Repeat with next cycle.   ? levonorgestrel (MIRENA) 20 mcg/24 hr (5 years) intrauterine device   1 EA, X 1 DOSE, Intrauteral, 1 EA, 0 Number of Refills, DEV   ? LORazepam (ATIVAN) 0.5 mg tablet Take 1 tablet by mouth at night as needed for anxiety,  Nausea and insomnia.   ? metFORMIN (GLUCOPHAGE) 1,000 mg tablet Take 1,000 mg by mouth twice daily with meals.   ? montelukast (SINGULAIR) 10 mg tablet Take 10 mg by mouth at bedtime daily.   ? prochlorperazine maleate (COMPAZINE) 10 mg tablet TAKE ONE TABLET BY MOUTH EVERY 6 HOURS   ? triamterene-hydrochlorothiazide (MAXZIDE) 75-50 mg tablet Take 1 tablet by mouth daily.     Vitals:    10/17/19 0831   BP: 135/86   Pulse: (!) 121   Weight: (!) 144.9 kg (319 lb 8 oz)   Height: 182.9 cm (72)   PainSc: Zero     Body mass index is 43.33 kg/m?Marland Kitchen     Physical Exam  Vitals reviewed. Constitutional:       Appearance: Normal appearance. She is well-groomed.   HENT:      Head: Normocephalic.   Pulmonary:      Effort: Pulmonary effort is normal.   Chest:      Comments: Bilateral breast scars well healed. Bilateral breast TE flipped.  Skin:     General: Skin is warm and dry.   Neurological:      Mental Status: She is alert.   Psychiatric:         Mood and Affect: Mood normal.         Behavior: Behavior normal. Behavior is cooperative.         Thought Content: Thought content normal.         Judgment: Judgment normal.          Assessment and Plan:  Patient s/p bilateral SSM and TE placement with mesh.  Bilateral breast TEs manually flipped to correct orientation. Bilateral chest prepped with Iodine paint. Using a wing tip needle 250cc of sterile saline injected into each breast TE.  Bilateral TE total 750cc.    Patient planning for autologous breast reconstruction following completion of chemotherapy and  radiation therapy.

## 2019-10-17 NOTE — Patient Instructions
Call Immediately to report the following:  Unexplained bleeding or bleeding that will not stop  Difficulty swallowing  Shortness of breath, wheezing, or trouble breathing  Rapid, irregular heartbeat; chest pain  Dizziness, lightheadedness  Rash or cut that swells or turns red, feels hot or painful, or begin to ooze  Diarrhea   Uncontrolled nausea or vomiting  Fever of 100.4 F or higher, or chills    Important Phone Numbers:  Cancer Center Main Number (answered 24 hours a day) [phone]  Cancer Center Scheduling (appointments) [phone]  Social Worker [phone]  Nutritionist [phone]

## 2019-10-17 NOTE — Progress Notes
Name: Jennifer Durham          MRN: [id number]      DOB: [birth year]      AGE: 44 y.o.   DATE OF SERVICE: 10/17/2019    Subjective:             Reason for Visit:  No chief complaint on file.        Jennifer Durham is a 44 y.o. female.     Cancer Staging  Malignant neoplasm of upper-outer quadrant of right breast in female, estrogen receptor positive (HCC)  Staging form: Breast, AJCC 8th Edition  - Clinical stage from 07/04/2019: Stage IA (cT1c, cN0(f), cM0, G2, ER+, PR+, HER2-) - Signed by Guy Begin, PA-C on 07/12/2019  - Pathologic stage from 08/21/2019: Stage IA (pT1c, pN1a, cM0, G2, ER+, PR+, HER2-) - Signed by Guy Begin, PA-C on 08/30/2019      History of Present Illness    DIAGNOSIS: right breast cancer   PAST ONCOLOGY HISTORY: Jennifer Durham is a 44 year old pre menopausal female who noted right breast dimpling in May 2021. She was sent for diagnostic imaging at an outside facility on 07/04/19. At that time a new right breast mass was seen as well as a lymph node with cortical thickening and biopsy was recommended. Right breast biopsy 07/04/2019 [name]) showed invasive carcinoma with mixed ductal and lobular features, grade II, ER 91-100%, PR 91-100%, HER2 1+ IHC. Right axilla biopsy showed fragments of polymorphous lymphoid tissue and no malignancy seen. Azriella underwent right modified radical mastectomy on 08/21/2019.  Tumor size was 1.8 cm, invasive ductal carcinoma.  Grade 2.  1 out of 34 lymph nodes was positive.  The size of LN metastasis was 5 mm.  ER was 96%, PR 99%,, HER-2 0+ by IHC.  Oncotype DX score 11.   ?  BREAST IMAGING:  Mammogram:    -- Bilateral diagnostic mammogram 07/04/19 [name]) revealed heterogeneously dense breast tissue. Spiculated mass with surrounding architectural distortion measuring up to 2 cm in the upper right medial breast at 10-11:00, 7.7 cm posterior to the nipple. Slightly prominent asymmetric right axillary lymph nodes.  -- Right diagnostic mammogram 07/16/19 ([facility]) revealed an the outside right MLO view from 07/04/2019, there was a small mass in the probable 9:00 position of the right breast at posterior depth, 2 cm inferior and 2 cm posterior to the known malignancy. Additional mammographic images will be performed to determine location. 2-D and 3-D images of the right breast were obtained.   In the 9:30 to 10:00 position of the right breast at middle to posterior depth, there was a 1.7 x 1.9 cm spiculated mass with internal tissue marker clip corresponding to biopsy-proven   malignancy. The biopsy-proven malignancy is approximately 8 cm posterior to the right nipple. In the 8:30 to 9:00 position of the right breast at posterior depth, there was an 0.8 cm lobular equal density mass which was not definitively seen on 2-D mammogram images from 03/21/2013.   ?  Ultrasound:    -- Right breast ultrasound 07/04/19 [facility]) revealed hypoechoic spiculated mass with surrounding architectural distortion with in the right upper breast at 10:00, 6 cm FTN. Significant posterior acoustic shadowing. Overall extent 1.3 x 1.7 x 1.6 cm. Increased cortical mantle thickening of a right axillary lymph node measuring up to 4 mm. BI-RADS 5.   -- Targeted right breast ultrasound 07/16/19 ([place]) revealed at 9:30, 9 cm from the nipple, demonstrated a 1.4 x 1.8 x 1.6  cm irregular hypoechoic mass with internal tissue marker clip corresponding to the biopsy-proven malignancy. In the right breast at 9:00, 12 cm from the nipple, there ws an 0.8 cm morphologically normal small  intramammary lymph node corresponding to the additional mammographic mass identified. No suspicious right axillary lymph nodes were seen. 3 morphologically normal right axillary lymph nodes were identified. The lymph node labeled #3, likely contains the internal tissue marker clip from outside biopsy.              OB/GYN HISTORY: Age at onset of menstruation 13. G2P2. She had her first child at age 47. LMP 03/2016 due to Mirena placement. Uterus and ovaries intact.     PRESENT THERAPY: adjuvant TC every 3 weeks x4 started 09/26/2019    Jennifer Durham is here today for cycle #2/4 TC.           Review of Systems   Constitutional: Positive for fatigue.   Gastrointestinal: Positive for blood in stool.   Genitourinary:        Pre-menopausal   LMP 03/2016  Mirena in place   Uterus and ovaries intact    Musculoskeletal: Positive for arthralgias.   Allergic/Immunologic: Positive for environmental allergies.       Allergies   Allergen Reactions   ? Tegaderm RASH       Objective:         ? acetaminophen (TYLENOL EXTRA STRENGTH) 500 mg tablet Take 1,000 mg by mouth every 6 hours as needed for Pain. Max of 4,000 mg of acetaminophen in 24 hours.   ? dexAMETHasone (DECADRON) 4 mg tablet Take with food. Take 2 tablets by mouth with breakfast and at noon the day before, the day of and the day after chemo. Repeat with next cycle.   ? levonorgestrel (MIRENA) 20 mcg/24 hr (5 years) intrauterine device   1 EA, X 1 DOSE, Intrauteral, 1 EA, 0 Number of Refills, DEV   ? LORazepam (ATIVAN) 0.5 mg tablet Take 1 tablet by mouth at night as needed for anxiety,  Nausea and insomnia.   ? metFORMIN (GLUCOPHAGE) 1,000 mg tablet Take 1,000 mg by mouth twice daily with meals.   ? montelukast (SINGULAIR) 10 mg tablet Take 10 mg by mouth at bedtime daily.   ? prochlorperazine maleate (COMPAZINE) 10 mg tablet TAKE ONE TABLET BY MOUTH EVERY 6 HOURS   ? triamterene-hydrochlorothiazide (MAXZIDE) 75-50 mg tablet Take 1 tablet by mouth daily.     Vitals:    10/17/19 1008   BP: 135/86   Pulse: (!) 121   Weight: (!) 145.8 kg (321 lb 6.4 oz)   Height: 182.9 cm (72.01)   PainSc: Zero     Body mass index is 43.58 kg/m?Marland Kitchen     Pain Score: Zero       Fatigue Scale: 5 (FATIGUE EASILY)    Pain Addressed:  N/A    Patient Evaluated for a Clinical Trial: No treatment clinical trial available for this patient.     [place] Cooperative Oncology Group performance status is 0, Fully active, able to carry on all pre-disease performance without restriction.Marland Kitchen     Physical Exam  Vitals and nursing note reviewed.   Pulmonary:      Effort: No respiratory distress.   Chest:      Breasts:         Right: No mass, skin change or tenderness.         Left: No mass, skin change or tenderness.  Lymphadenopathy:      Cervical: No cervical adenopathy.       LABS:  CBC w diff    Lab Results   Component Value Date/Time    WBC 21.1 (H) 10/17/2019 09:59 AM    RBC 3.42 (L) 10/17/2019 09:59 AM    HGB 10.3 (L) 10/17/2019 09:59 AM    HCT 30.0 (L) 10/17/2019 09:59 AM    MCV 87.9 10/17/2019 09:59 AM    MCH 30.1 10/17/2019 09:59 AM    MCHC 34.2 10/17/2019 09:59 AM    RDW 15.2 (H) 10/17/2019 09:59 AM    PLTCT 443 (H) 10/17/2019 09:59 AM    MPV 7.5 10/17/2019 09:59 AM    Lab Results   Component Value Date/Time    NEUT 87 (H) 10/17/2019 09:59 AM    ANC 18.70 (H) 10/17/2019 09:59 AM    LYMA 7 (L) 10/17/2019 09:59 AM    ALC 1.40 10/17/2019 09:59 AM    MONA 5 10/17/2019 09:59 AM    AMC 0.90 (H) 10/17/2019 09:59 AM    EOSA 0 10/17/2019 09:59 AM    AEC 0.00 10/17/2019 09:59 AM    BASA 1 10/17/2019 09:59 AM    ABC 0.10 10/17/2019 09:59 AM        Comprehensive Metabolic Profile    Lab Results   Component Value Date/Time    NA 138 10/17/2019 09:59 AM    K 3.9 10/17/2019 09:59 AM    CL 103 10/17/2019 09:59 AM    CO2 23 10/17/2019 09:59 AM    GAP 12 10/17/2019 09:59 AM    BUN 16 10/17/2019 09:59 AM    CR 1.07 (H) 10/17/2019 09:59 AM    GLU 160 (H) 10/17/2019 09:59 AM    Lab Results   Component Value Date/Time    CA 9.1 10/17/2019 09:59 AM    ALBUMIN 4.3 10/17/2019 09:59 AM    TOTPROT 7.2 10/17/2019 09:59 AM    ALKPHOS 39 10/17/2019 09:59 AM    AST 15 10/17/2019 09:59 AM    ALT 13 10/17/2019 09:59 AM    TOTBILI 0.3 10/17/2019 09:59 AM    GFR 56 (L) 10/17/2019 09:59 AM    GFRAA >60 10/17/2019 09:59 AM                  Assessment and Plan:    1. 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   2.  Jennifer Durham underwent right modified radical mastectomy on 08/21/2019.  Tumor size was 1.8 cm, invasive ductal carcinoma.  Grade 2.  1 out of 34 lymph nodes was positive.  The size of LN metastasis was 5 mm.  ER was 96%, PR 99%,, HER-2 0+ by IHC. Oncotype Dx RS 11.   3.  Since Jennifer Durham is premenopausal and has node positive disease, we recommend adjuvant chemotherapy.  After discussing the options of AC-T chemotherapy versus TC chemotherapy, we decided to proceed with TC for four cycles.  We discussed the data based on ABC trials and breast cancer.  Jennifer Durham has significant history of heart disease in the family and is very concerned about Adriamycin causing heart damage.  Therefore TC seems like better choice with equivalent efficacy based on trials.  She will receive cycle #2/4 TC today.   4. Bone pain with neulasta; she was encouraged to take Motrin 800mg  TID.   5. Fatigue; continue to hydrate and rest when  needed.   Hematochezia; we will discuss possible colonoscopy after chemotherapy is completed.   6. After chemotherapy, we will recommend adjuvant endocrine therapy which will be tamoxifen with or without ovarian function suppression.  She has Mirena IUD which we will address after chemotherapy is completed.  She is seeing radiation oncology in the near future for radiation therapy recommendations.  7.  RTC in 3 weeks with labs and chemotherapy.     My collaborating MD Dr Raul Del.     For up to date information on the COVID-19 virus, visit the [facility] website. [email or web address]  ? General supportive care during cold and flu season and infection prevention reminders:   o Wash hands often with soap and water for at least 20 seconds  o Cover your mouth and nose  o Social distancing: try to maintain 6 feet between you and other people  o Stay home if sick and symptoms mild or manageable  - If you must be around people wear a mask    ? If you are having symptoms of a lower respiratory infection (cough, shortness of breath) and/or fever AND either traveled in last 30 days (internationally or to region of exposure) OR known exposure to patient with COVID19:    o Call your primary care provider for questions or health needs.   - Tell your doctor about your recent travel and your symptoms    o In a medical emergency, call 911 or go to the nearest emergency room.    Elenore Paddy Jefferie Holston, APRN-NP

## 2019-10-17 NOTE — Progress Notes
CHEMO NOTE  Verified chemo consent signed and in chart.    Verified initiate chemo order in O2    Blood return positive via: Peripheral (22 ga)    BSA and dose double checked (agree with orders as written) with: yes     Labs/applicable tests checked: CBC and Comprehensive Metabolic Panel (CMP)    Chemo regime: Drug/cycle/day Taxotere, cytoxan IV C2 D1, udenyca SQ    Rate verified and armband double checkwith second RN: yes    Patient education offered and stated understanding. Denies questions at this time.    Chemo consent signed on 09/11/19.  Pt seen in exam by Gershon Mussel, ARNP, no new questions or concerns at this time.  Pt tolerated taxotere and cytoxan infusions, and udenyca injection without incident.  Pt denies need for AVS, left ambulatory at 1420.

## 2019-10-18 ENCOUNTER — Encounter: Admit: 2019-10-18 | Discharge: 2019-10-18 | Payer: BC Managed Care – PPO

## 2019-10-18 NOTE — Telephone Encounter
Patient made an appointment for IUD replacement. Calling patient to further discuss including medical records.  Patient reports she had Mirena IUD inserted in March 2018. She reports hx of PCOS and recent breast cancer diagnosis. Patient confirms she wants IUD removed. Provided clinic fax number and patient was advised to have records sent over including IUD insertion appointment notes and last pap smear. Albesa Seen, RN    Future Appointments   Date Time Provider Department Center   11/07/2019  9:00 AM NURSE CHAIR IN LAB LEVEL 2 CCC2 [place] Exam   11/07/2019  9:30 AM Lafaver, Elenore Paddy, APRN-NP CCC2 [place] Exam   11/07/2019 10:30 AM CC [facility] TREATMENT CCT [place] [facility]   11/28/2019  7:45 AM NURSE CHAIR IN LAB LEVEL 3 [id number] None   11/28/2019  8:30 AM Lafaver, Elenore Paddy, APRN-NP CCC2 [place] Exam   11/28/2019  9:30 AM CC [facility] [facility] TREATMENT CCT [place] Treatme   12/19/2019 10:00 AM Reola Mosher, MD [id number] [place] [facility]   12/19/2019 11:45 AM Hanley Hays, APRN-NP [id number] Plastic Surg   01/10/2020  3:30 PM Myrtie Hawk, MD MPAOBGYN OB/GYN   06/05/2020  8:00 AM Renford Dills, MD [id number] [place] Exam   06/05/2020  8:00 AM BIS IC1 ? BIOIMPEDENCE SPECTROSCOPY Philip Aspen [place] Exam   06/05/2020  8:30 AM Renelda Mom, RN [id number]  Exam

## 2019-10-23 ENCOUNTER — Encounter: Admit: 2019-10-23 | Discharge: 2019-10-23 | Payer: BC Managed Care – PPO

## 2019-10-23 MED ORDER — LORAZEPAM 0.5 MG PO TAB
ORAL_TABLET | ORAL | 2 refills | 12.00000 days | Status: AC | PRN
Start: 2019-10-23 — End: ?

## 2019-11-07 ENCOUNTER — Encounter: Admit: 2019-11-07 | Discharge: 2019-11-07 | Payer: BC Managed Care – PPO

## 2019-11-07 DIAGNOSIS — C50919 Malignant neoplasm of unspecified site of unspecified female breast: Secondary | ICD-10-CM

## 2019-11-07 DIAGNOSIS — C50411 Malignant neoplasm of upper-outer quadrant of right female breast: Secondary | ICD-10-CM

## 2019-11-07 DIAGNOSIS — I10 Essential (primary) hypertension: Secondary | ICD-10-CM

## 2019-11-07 DIAGNOSIS — C50911 Malignant neoplasm of unspecified site of right female breast: Secondary | ICD-10-CM

## 2019-11-07 DIAGNOSIS — J302 Other seasonal allergic rhinitis: Secondary | ICD-10-CM

## 2019-11-07 DIAGNOSIS — E8881 Metabolic syndrome: Secondary | ICD-10-CM

## 2019-11-07 DIAGNOSIS — E282 Polycystic ovarian syndrome: Secondary | ICD-10-CM

## 2019-11-07 MED ORDER — DOCETAXEL 500ML IVPB
75 mg/m2 | Freq: Once | INTRAVENOUS | 0 refills | Status: CP
Start: 2019-11-07 — End: ?
  Administered 2019-11-07 (×2): 204 mg via INTRAVENOUS

## 2019-11-07 MED ORDER — HEPARIN, PORCINE (PF) 100 UNIT/ML IV SYRG
500 [IU] | Freq: Once | 0 refills | Status: DC
Start: 2019-11-07 — End: 2019-11-07

## 2019-11-07 MED ORDER — DEXAMETHASONE IVPB
12 mg | Freq: Once | INTRAVENOUS | 0 refills | Status: CP
Start: 2019-11-07 — End: ?
  Administered 2019-11-07 (×2): 12 mg via INTRAVENOUS

## 2019-11-07 MED ORDER — POTASSIUM CHLORIDE 20 MEQ PO TBTQ
40 meq | ORAL_TABLET | Freq: Two times a day (BID) | ORAL | 0 refills | 30.00000 days | Status: AC
Start: 2019-11-07 — End: ?

## 2019-11-07 MED ORDER — CYCLOPHOSPHAMIDE IVPB
600 mg/m2 | Freq: Once | INTRAVENOUS | 0 refills | Status: CP
Start: 2019-11-07 — End: ?
  Administered 2019-11-07 (×2): 1600 mg via INTRAVENOUS

## 2019-11-07 MED ORDER — DIPHENHYDRAMINE HCL 50 MG/ML IJ SOLN
50 mg | Freq: Once | INTRAVENOUS | 0 refills | Status: CP
Start: 2019-11-07 — End: ?
  Administered 2019-11-07: 16:00:00 50 mg via INTRAVENOUS

## 2019-11-07 MED ORDER — PEGFILGRASTIM-CBQV 6 MG/0.6 ML SC SYRG
6 mg | Freq: Once | SUBCUTANEOUS | 0 refills | Status: CP
Start: 2019-11-07 — End: ?
  Administered 2019-11-07: 19:00:00 6 mg via SUBCUTANEOUS

## 2019-11-07 MED ORDER — ONDANSETRON 8 MG PO TBDI
16 mg | Freq: Once | ORAL | 0 refills | Status: CP
Start: 2019-11-07 — End: ?
  Administered 2019-11-07: 16:00:00 16 mg via ORAL

## 2019-11-07 MED ORDER — FAMOTIDINE (PF) 20 MG/2 ML IV SOLN
20 mg | Freq: Once | INTRAVENOUS | 0 refills | Status: CP
Start: 2019-11-07 — End: ?
  Administered 2019-11-07: 16:00:00 20 mg via INTRAVENOUS

## 2019-11-07 NOTE — Progress Notes
Name: Jennifer Durham          MRN: [id number]      DOB: 17-Dec-1975      AGE: 44 y.o.   DATE OF SERVICE: 11/07/2019    Subjective:             Reason for Visit:  Follow Up        Jennifer Durham is a 44 y.o. female.     Cancer Staging  Malignant neoplasm of upper-outer quadrant of right breast in female, estrogen receptor positive (HCC)  Staging form: Breast, AJCC 8th Edition  - Clinical stage from 07/04/2019: Stage IA (cT1c, cN0(f), cM0, G2, ER+, PR+, HER2-) - Signed by Guy Begin, PA-C on 07/12/2019  - Pathologic stage from 08/21/2019: Stage IA (pT1c, pN1a, cM0, G2, ER+, PR+, HER2-) - Signed by Guy Begin, PA-C on 08/30/2019      History of Present Illness    DIAGNOSIS: right breast cancer   PAST ONCOLOGY HISTORY: Jennifer Durham is a 44 year old pre menopausal female who noted right breast dimpling in May 2021. She was sent for diagnostic imaging at an outside facility on 07/04/19. At that time a new right breast mass was seen as well as a lymph node with cortical thickening and biopsy was recommended. Right breast biopsy 07/04/2019 Jennifer Durham) showed invasive carcinoma with mixed ductal and lobular features, grade II, ER 91-100%, PR 91-100%, HER2 1+ IHC. Right axilla biopsy showed fragments of polymorphous lymphoid tissue and no malignancy seen. Jennifer Durham underwent right modified radical mastectomy on 08/21/2019.  Tumor size was 1.8 cm, invasive ductal carcinoma.  Grade 2.  1 out of 34 lymph nodes was positive.  The size of LN metastasis was 5 mm.  ER was 96%, PR 99%,, HER-2 0+ by IHC.  Oncotype DX score 11.   ?  BREAST IMAGING:  Mammogram:    -- Bilateral diagnostic mammogram 07/04/19 Jennifer Durham) revealed heterogeneously dense breast tissue. Spiculated mass with surrounding architectural distortion measuring up to 2 cm in the upper right medial breast at 10-11:00, 7.7 cm posterior to the nipple. Slightly prominent asymmetric right axillary lymph nodes.  -- Right diagnostic mammogram 07/16/19 ([facility]) revealed an the outside right MLO view from 07/04/2019, there was a small mass in the probable 9:00 position of the right breast at posterior depth, 2 cm inferior and 2 cm posterior to the known malignancy. Additional mammographic images will be performed to determine location. 2-D and 3-D images of the right breast were obtained.   In the 9:30 to 10:00 position of the right breast at middle to posterior depth, there was a 1.7 x 1.9 cm spiculated mass with internal tissue marker clip corresponding to biopsy-proven   malignancy. The biopsy-proven malignancy is approximately 8 cm posterior to the right nipple. In the 8:30 to 9:00 position of the right breast at posterior depth, there was an 0.8 cm lobular equal density mass which was not definitively seen on 2-D mammogram images from 03/21/2013.   ?  Ultrasound:    -- Right breast ultrasound 07/04/19 [facility]) revealed hypoechoic spiculated mass with surrounding architectural distortion with in the right upper breast at 10:00, 6 cm FTN. Significant posterior acoustic shadowing. Overall extent 1.3 x 1.7 x 1.6 cm. Increased cortical mantle thickening of a right axillary lymph node measuring up to 4 mm. BI-RADS 5.   -- Targeted right breast ultrasound 07/16/19 ([place]) revealed at 9:30, 9 cm from the nipple, demonstrated a 1.4 x 1.8 x 1.6 cm irregular hypoechoic  mass with internal tissue marker clip corresponding to the biopsy-proven malignancy. In the right breast at 9:00, 12 cm from the nipple, there ws an 0.8 cm morphologically normal small  intramammary lymph node corresponding to the additional mammographic mass identified. No suspicious right axillary lymph nodes were seen. 3 morphologically normal right axillary lymph nodes were identified. The lymph node labeled #3, likely contains the internal tissue marker clip from outside biopsy.              OB/GYN HISTORY: Age at onset of menstruation 13. G2P2. She had her first child at age 23. LMP 03/2016 due to Mirena placement. Uterus and ovaries intact.     PRESENT THERAPY: adjuvant TC every 3 weeks x4 started 09/26/2019    Jennifer Durham is here today for cycle #3/4 TC.           Review of Systems   Constitutional: Positive for activity change and fatigue.   HENT: Positive for rhinorrhea and sinus pressure.    Respiratory: Positive for shortness of breath (with exercise).    Gastrointestinal: Positive for blood in stool.   Genitourinary:        Pre-menopausal   LMP 03/2016  Mirena in place   Uterus and ovaries intact    Musculoskeletal: Positive for arthralgias and back pain.   Neurological: Positive for light-headedness.   Psychiatric/Behavioral: Positive for agitation and sleep disturbance. The patient is nervous/anxious.        Allergies   Allergen Reactions   ? Tegaderm RASH       Objective:         ? acetaminophen (TYLENOL EXTRA STRENGTH) 500 mg tablet Take 1,000 mg by mouth every 6 hours as needed for Pain. Max of 4,000 mg of acetaminophen in 24 hours.   ? dexAMETHasone (DECADRON) 4 mg tablet Take with food. Take 2 tablets by mouth with breakfast and at noon the day before, the day of and the day after chemo. Repeat with next cycle.   ? levonorgestrel (MIRENA) 20 mcg/24 hr (5 years) intrauterine device   1 EA, X 1 DOSE, Intrauteral, 1 EA, 0 Number of Refills, DEV   ? LORazepam (ATIVAN) 0.5 mg tablet Take 1 tablet by mouth at night as needed for anxiety,  Nausea and insomnia.   ? metFORMIN (GLUCOPHAGE) 1,000 mg tablet Take 1,000 mg by mouth twice daily with meals.   ? montelukast (SINGULAIR) 10 mg tablet Take 10 mg by mouth at bedtime daily.   ? potassium chloride SR (KLOR-CON M20) 20 mEq tablet Take two tablets by mouth twice daily. Take with a meal and a full glass of water.   ? prochlorperazine maleate (COMPAZINE) 10 mg tablet TAKE ONE TABLET BY MOUTH EVERY 6 HOURS   ? triamterene-hydrochlorothiazide (MAXZIDE) 75-50 mg tablet Take 1 tablet by mouth daily.     Vitals:    11/07/19 0911   BP: 109/78   BP Source: Arm, Left Upper   Patient Position: Sitting   Pulse: 109   Resp: 16   Temp: 36.8 ?C (98.3 ?F)   TempSrc: Oral   SpO2: 98%   Weight: (!) 145.6 kg (321 lb)   Height: 182.9 cm (72.01)   PainSc: Three     Body mass index is 43.53 kg/m?Marland Kitchen     Pain Score: Three  Pain Loc: Back    Fatigue Scale: 8    Pain Addressed:  N/A    Patient Evaluated for a Clinical Trial: No treatment clinical trial available for this  patient.     [place] Cooperative Oncology Group performance status is 0, Fully active, able to carry on all pre-disease performance without restriction.Marland Kitchen     Physical Exam  Vitals and nursing note reviewed.   Pulmonary:      Effort: No respiratory distress.   Chest:      Breasts:         Right: No mass, skin change or tenderness.         Left: No mass, skin change or tenderness.       Lymphadenopathy:      Cervical: No cervical adenopathy.       LABS:  CBC w diff    Lab Results   Component Value Date/Time    WBC 15.8 (H) 11/07/2019 08:50 AM    RBC 3.26 (L) 11/07/2019 08:50 AM    HGB 9.6 (L) 11/07/2019 08:50 AM    HCT 29.3 (L) 11/07/2019 08:50 AM    MCV 90.0 11/07/2019 08:50 AM    MCH 29.6 11/07/2019 08:50 AM    MCHC 32.9 11/07/2019 08:50 AM    RDW 16.7 (H) 11/07/2019 08:50 AM    PLTCT 341 11/07/2019 08:50 AM    MPV 7.5 11/07/2019 08:50 AM    Lab Results   Component Value Date/Time    NEUT 84 (H) 11/07/2019 08:50 AM    ANC 13.20 (H) 11/07/2019 08:50 AM    LYMA 8 (L) 11/07/2019 08:50 AM    ALC 1.30 11/07/2019 08:50 AM    MONA 7 11/07/2019 08:50 AM    AMC 1.20 (H) 11/07/2019 08:50 AM    EOSA 0 11/07/2019 08:50 AM    AEC 0.00 11/07/2019 08:50 AM    BASA 1 11/07/2019 08:50 AM    ABC 0.10 11/07/2019 08:50 AM        Comprehensive Metabolic Profile    Lab Results   Component Value Date/Time    NA 139 11/07/2019 08:50 AM    K 3.2 (L) 11/07/2019 08:50 AM    CL 103 11/07/2019 08:50 AM    CO2 22 11/07/2019 08:50 AM    GAP 14 (H) 11/07/2019 08:50 AM    BUN 12 11/07/2019 08:50 AM    CR 1.07 (H) 11/07/2019 08:50 AM    GLU 147 (H) 11/07/2019 08:50 AM    Lab Results   Component Value Date/Time    CA 9.0 11/07/2019 08:50 AM    ALBUMIN 4.2 11/07/2019 08:50 AM    TOTPROT 7.0 11/07/2019 08:50 AM    ALKPHOS 44 11/07/2019 08:50 AM    AST 13 11/07/2019 08:50 AM    ALT 12 11/07/2019 08:50 AM    TOTBILI 0.4 11/07/2019 08:50 AM    GFR 56 (L) 11/07/2019 08:50 AM    GFRAA >60 11/07/2019 08:50 AM                  Assessment and Plan:    1. 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   2.  Reighlyn underwent right modified radical mastectomy on 08/21/2019.  Tumor size was 1.8 cm, invasive ductal carcinoma.  Grade 2.  1 out of 34 lymph nodes was positive.  The size of LN metastasis was 5 mm.  ER was 96%, PR 99%,, HER-2 0+ by IHC. Oncotype Dx RS 11.   3.  Since Aella is premenopausal and has node positive disease, we recommend adjuvant chemotherapy.  After discussing the  options of AC-T chemotherapy versus TC chemotherapy, we decided to proceed with TC for four cycles.  We discussed the data based on ABC trials and breast cancer.  Kentoria has significant history of heart disease in the family and is very concerned about Adriamycin causing heart damage.  Therefore TC seems like better choice with equivalent efficacy based on trials.  She will receive cycle #3/4 TC today.   4. Bone pain with neulasta; she was encouraged to take Motrin 800mg  TID.   5. Fatigue; continue to hydrate and rest when needed.   6. Hematochezia; we will discuss possible colonoscopy after chemotherapy is completed.   7. Hypokalemia; she was given a prescription for potassium BID for 5 days.  8. After chemotherapy, we will recommend adjuvant endocrine therapy which will be tamoxifen with or without ovarian function suppression.  She has Mirena IUD which we will address after chemotherapy is completed.  She is seeing radiation oncology in the near future for radiation therapy recommendations.  9.  RTC in 3 weeks with labs and chemotherapy.     My collaborating MD Dr Raul Del.     For up to date information on the COVID-19 virus, visit the [facility] website. [email or web address]  ? General supportive care during cold and flu season and infection prevention reminders:   o Wash hands often with soap and water for at least 20 seconds  o Cover your mouth and nose  o Social distancing: try to maintain 6 feet between you and other people  o Stay home if sick and symptoms mild or manageable  - If you must be around people wear a mask    ? If you are having symptoms of a lower respiratory infection (cough, shortness of breath) and/or fever AND either traveled in last 30 days (internationally or to region of exposure) OR known exposure to patient with COVID19:    o Call your primary care provider for questions or health needs.   - Tell your doctor about your recent travel and your symptoms    o In a medical emergency, call 911 or go to the nearest emergency room.    Elenore Paddy Lem Peary, APRN-NP

## 2019-11-07 NOTE — Progress Notes
CHEMO NOTE  Verified chemo consent signed and in chart.    Verified initiate chemo order in O2    Blood return positive via: Peripheral (24 ga)    BSA and dose double checked (agree with orders as written) with: yes     Labs/applicable tests checked: CBC and Comprehensive Metabolic Panel (CMP)    Chemo regime: Drug/cycle/day C3 TC    Rate verified and armband double checkwith second RN: yes    Patient education offered and stated understanding. Denies questions at this time.    Treatment administered without adverse incident.  Dismissed alert and ambulatory at 1335.

## 2019-11-07 NOTE — Telephone Encounter
Telephone call to patient to advise of earlier appointment time per Dr. Janice Norrie. Patient verbalized wanting to change to 12 noon.    Future Appointments   Date Time Provider Department Center   11/07/2019  9:30 AM Lafaver, Elenore Paddy, APRN-NP CCC2 [place] Exam   11/07/2019 10:30 AM [facility] TREATMENT CCT [place] [facility]   11/28/2019  7:45 AM NURSE CHAIR IN LAB LEVEL 3 CLNLAB1 None   11/28/2019  8:30 AM Lafaver, Elenore Paddy, APRN-NP CCC2 [place] Exam   11/28/2019  9:30 AM CC [facility] [facility] TREATMENT CCT [place] Treatme   12/19/2019 10:00 AM Reola Mosher, MD [email or web address] [place] Radiati   12/19/2019 11:45 AM Hanley Hays, APRN-NP [id number] Plastic Surg   01/10/2020 12:00 PM Myrtie Hawk, MD MPAOBGYN OB/GYN   04/11/2020  2:00 PM Dorothyann Peng, MD MPAPDERM IM   06/05/2020  8:00 AM Renford Dills, MD [id number] [place] Exam   06/05/2020  8:00 AM BIS IC1 ? BIOIMPEDENCE SPECTROSCOPY [facility] [place] Exam   06/05/2020  8:30 AM Renelda Mom, RN [email or web address] [place] Exam

## 2019-11-26 ENCOUNTER — Encounter: Admit: 2019-11-26 | Discharge: 2019-11-26 | Payer: BC Managed Care – PPO

## 2019-11-26 MED ORDER — PROCHLORPERAZINE MALEATE 10 MG PO TAB
10 mg | ORAL_TABLET | ORAL | 1 refills | Status: AC
Start: 2019-11-26 — End: ?

## 2019-11-28 ENCOUNTER — Encounter: Admit: 2019-11-28 | Discharge: 2019-11-28 | Payer: BC Managed Care – PPO

## 2019-11-28 DIAGNOSIS — E8881 Metabolic syndrome: Secondary | ICD-10-CM

## 2019-11-28 DIAGNOSIS — C50919 Malignant neoplasm of unspecified site of unspecified female breast: Secondary | ICD-10-CM

## 2019-11-28 DIAGNOSIS — C50411 Malignant neoplasm of upper-outer quadrant of right female breast: Secondary | ICD-10-CM

## 2019-11-28 DIAGNOSIS — I10 Essential (primary) hypertension: Secondary | ICD-10-CM

## 2019-11-28 DIAGNOSIS — C50911 Malignant neoplasm of unspecified site of right female breast: Secondary | ICD-10-CM

## 2019-11-28 DIAGNOSIS — E282 Polycystic ovarian syndrome: Secondary | ICD-10-CM

## 2019-11-28 DIAGNOSIS — J302 Other seasonal allergic rhinitis: Secondary | ICD-10-CM

## 2019-11-28 MED ORDER — CYCLOPHOSPHAMIDE IVPB
600 mg/m2 | Freq: Once | INTRAVENOUS | 0 refills | Status: CP
Start: 2019-11-28 — End: ?
  Administered 2019-11-28: 18:00:00 1600 mg via INTRAVENOUS

## 2019-11-28 MED ORDER — DEXAMETHASONE IVPB
12 mg | Freq: Once | INTRAVENOUS | 0 refills | Status: CP
Start: 2019-11-28 — End: ?
  Administered 2019-11-28 (×2): 12 mg via INTRAVENOUS

## 2019-11-28 MED ORDER — FAMOTIDINE (PF) 20 MG/2 ML IV SOLN
20 mg | Freq: Once | INTRAVENOUS | 0 refills | Status: CP
Start: 2019-11-28 — End: ?
  Administered 2019-11-28: 15:00:00 20 mg via INTRAVENOUS

## 2019-11-28 MED ORDER — DOCETAXEL 500ML IVPB
75 mg/m2 | Freq: Once | INTRAVENOUS | 0 refills | Status: CP
Start: 2019-11-28 — End: ?
  Administered 2019-11-28 (×2): 204 mg via INTRAVENOUS

## 2019-11-28 MED ORDER — PEGFILGRASTIM-CBQV 6 MG/0.6 ML SC SYRG
6 mg | Freq: Once | SUBCUTANEOUS | 0 refills | Status: CP
Start: 2019-11-28 — End: ?
  Administered 2019-11-28: 19:00:00 6 mg via SUBCUTANEOUS

## 2019-11-28 MED ORDER — DIPHENHYDRAMINE HCL 50 MG/ML IJ SOLN
50 mg | Freq: Once | INTRAVENOUS | 0 refills | Status: CP
Start: 2019-11-28 — End: ?
  Administered 2019-11-28: 15:00:00 50 mg via INTRAVENOUS

## 2019-11-28 MED ORDER — ONDANSETRON 8 MG PO TBDI
16 mg | Freq: Once | ORAL | 0 refills | Status: CP
Start: 2019-11-28 — End: ?
  Administered 2019-11-28: 15:00:00 16 mg via ORAL

## 2019-11-28 NOTE — Progress Notes
Name: Jennifer Durham          MRN: [id number]      DOB: 07-31-1975      AGE: 44 y.o.   DATE OF SERVICE: 11/28/2019    Subjective:             Reason for Visit:  Follow Up        Jennifer Durham is a 44 y.o. female.     Cancer Staging  Malignant neoplasm of upper-outer quadrant of right breast in female, estrogen receptor positive (HCC)  Staging form: Breast, AJCC 8th Edition  - Clinical stage from 07/04/2019: Stage IA (cT1c, cN0(f), cM0, G2, ER+, PR+, HER2-) - Signed by Guy Begin, PA-C on 07/12/2019  - Pathologic stage from 08/21/2019: Stage IA (pT1c, pN1a, cM0, G2, ER+, PR+, HER2-) - Signed by Guy Begin, PA-C on 08/30/2019      History of Present Illness    DIAGNOSIS: right breast cancer   PAST ONCOLOGY HISTORY: Jennifer Durham is a 44 year old pre menopausal female who noted right breast dimpling in May 2021. She was sent for diagnostic imaging at an outside facility on 07/04/19. At that time a new right breast mass was seen as well as a lymph node with cortical thickening and biopsy was recommended. Right breast biopsy 07/04/2019 [name]) showed invasive carcinoma with mixed ductal and lobular features, grade II, ER 91-100%, PR 91-100%, HER2 1+ IHC. Right axilla biopsy showed fragments of polymorphous lymphoid tissue and no malignancy seen. [name] underwent right modified radical mastectomy on 08/21/2019.  Tumor size was 1.8 cm, invasive ductal carcinoma.  Grade 2.  1 out of 34 lymph nodes was positive.  The size of LN metastasis was 5 mm.  ER was 96%, PR 99%,, HER-2 0+ by IHC.  Oncotype DX score 11.   ?  BREAST IMAGING:  Mammogram:    -- Bilateral diagnostic mammogram 07/04/19 [name]) revealed heterogeneously dense breast tissue. Spiculated mass with surrounding architectural distortion measuring up to 2 cm in the upper right medial breast at 10-11:00, 7.7 cm posterior to the nipple. Slightly prominent asymmetric right axillary lymph nodes.  -- Right diagnostic mammogram 07/16/19 ([name]) revealed an the outside right MLO view from 07/04/2019, there was a small mass in the probable 9:00 position of the right breast at posterior depth, 2 cm inferior and 2 cm posterior to the known malignancy. Additional mammographic images will be performed to determine location. 2-D and 3-D images of the right breast were obtained.   In the 9:30 to 10:00 position of the right breast at middle to posterior depth, there was a 1.7 x 1.9 cm spiculated mass with internal tissue marker clip corresponding to biopsy-proven   malignancy. The biopsy-proven malignancy is approximately 8 cm posterior to the right nipple. In the 8:30 to 9:00 position of the right breast at posterior depth, there was an 0.8 cm lobular equal density mass which was not definitively seen on 2-D mammogram images from 03/21/2013.   ?  Ultrasound:    -- Right breast ultrasound 07/04/19 [name]) revealed hypoechoic spiculated mass with surrounding architectural distortion with in the right upper breast at 10:00, 6 cm FTN. Significant posterior acoustic shadowing. Overall extent 1.3 x 1.7 x 1.6 cm. Increased cortical mantle thickening of a right axillary lymph node measuring up to 4 mm. BI-RADS 5.   -- Targeted right breast ultrasound 07/16/19 ([name]) revealed at 9:30, 9 cm from the nipple, demonstrated a 1.4 x 1.8 x 1.6 cm irregular hypoechoic  mass with internal tissue marker clip corresponding to the biopsy-proven malignancy. In the right breast at 9:00, 12 cm from the nipple, there ws an 0.8 cm morphologically normal small  intramammary lymph node corresponding to the additional mammographic mass identified. No suspicious right axillary lymph nodes were seen. 3 morphologically normal right axillary lymph nodes were identified. The lymph node labeled #3, likely contains the internal tissue marker clip from outside biopsy.              OB/GYN HISTORY: Age at onset of menstruation 13. G2P2. She had her first child at age 62. LMP 03/2016 due to Mirena placement. Uterus and ovaries intact.     PRESENT THERAPY: adjuvant TC every 3 weeks x4 started 09/26/2019    Jennifer Monroy is here today for cycle #4/4 TC.           Review of Systems   Constitutional: Positive for activity change and fatigue.   HENT: Positive for rhinorrhea and sinus pressure.    Respiratory: Positive for shortness of breath (with exercise).    Gastrointestinal: Positive for blood in stool.   Genitourinary:        Pre-menopausal   LMP 03/2016  Mirena in place   Uterus and ovaries intact    Musculoskeletal: Positive for arthralgias and back pain.   Neurological: Positive for light-headedness and numbness.   Psychiatric/Behavioral: Positive for agitation and sleep disturbance. The patient is nervous/anxious.        Allergies   Allergen Reactions   ? Tegaderm RASH       Objective:         ? acetaminophen (TYLENOL EXTRA STRENGTH) 500 mg tablet Take 1,000 mg by mouth every 6 hours as needed for Pain. Max of 4,000 mg of acetaminophen in 24 hours.   ? dexAMETHasone (DECADRON) 4 mg tablet Take with food. Take 2 tablets by mouth with breakfast and at noon the day before, the day of and the day after chemo. Repeat with next cycle.   ? levonorgestrel (MIRENA) 20 mcg/24 hr (5 years) intrauterine device   1 EA, X 1 DOSE, Intrauteral, 1 EA, 0 Number of Refills, DEV   ? LORazepam (ATIVAN) 0.5 mg tablet Take 1 tablet by mouth at night as needed for anxiety,  Nausea and insomnia.   ? metFORMIN (GLUCOPHAGE) 1,000 mg tablet Take 1,000 mg by mouth twice daily with meals.   ? montelukast (SINGULAIR) 10 mg tablet Take 10 mg by mouth at bedtime daily.   ? potassium chloride SR (KLOR-CON M20) 20 mEq tablet Take two tablets by mouth twice daily. Take with a meal and a full glass of water.   ? prochlorperazine maleate (COMPAZINE) 10 mg tablet TAKE ONE TABLET BY MOUTH EVERY 6 HOURS   ? triamterene-hydrochlorothiazide (MAXZIDE) 75-50 mg tablet Take 1 tablet by mouth daily.     Vitals:    11/28/19 0822   BP: 134/85   BP Source: Arm, Left Upper   Patient Position: Sitting   Pulse: 110   Resp: 17   Temp: 37.2 ?C (98.9 ?F)   TempSrc: Oral   SpO2: 98%   Weight: (!) 146.3 kg (322 lb 9.6 oz)   Height: 182.9 cm (72.01)   PainSc: Zero     Body mass index is 43.74 kg/m?Marland Kitchen     Pain Score: Zero       Fatigue Scale: 7    Pain Addressed:  N/A    Patient Evaluated for a Clinical Trial: No treatment clinical trial  available for this patient.     [place] Cooperative Oncology Group performance status is 0, Fully active, able to carry on all pre-disease performance without restriction.Marland Kitchen     Physical Exam  Vitals and nursing note reviewed.   Pulmonary:      Effort: No respiratory distress.   Chest:      Breasts:         Right: No mass, skin change or tenderness.         Left: No mass, skin change or tenderness.       Lymphadenopathy:      Cervical: No cervical adenopathy.       LABS:  CBC w diff    Lab Results   Component Value Date/Time    WBC 17.7 (H) 11/28/2019 08:10 AM    RBC 3.25 (L) 11/28/2019 08:10 AM    HGB 9.5 (L) 11/28/2019 08:10 AM    HCT 28.8 (L) 11/28/2019 08:10 AM    MCV 88.8 11/28/2019 08:10 AM    MCH 29.4 11/28/2019 08:10 AM    MCHC 33.1 11/28/2019 08:10 AM    RDW 18.3 (H) 11/28/2019 08:10 AM    PLTCT 373 11/28/2019 08:10 AM    MPV 7.3 11/28/2019 08:10 AM    Lab Results   Component Value Date/Time    NEUT 90 (H) 11/28/2019 08:10 AM    ANC 16.10 (H) 11/28/2019 08:10 AM    LYMA 5 (L) 11/28/2019 08:10 AM    ALC 0.80 (L) 11/28/2019 08:10 AM    MONA 5 11/28/2019 08:10 AM    AMC 0.80 11/28/2019 08:10 AM    EOSA 0 11/28/2019 08:10 AM    AEC 0.00 11/28/2019 08:10 AM    BASA 0 11/28/2019 08:10 AM    ABC 0.00 11/28/2019 08:10 AM        Comprehensive Metabolic Profile    Lab Results   Component Value Date/Time    NA 139 11/28/2019 08:10 AM    K 3.7 11/28/2019 08:10 AM    CL 104 11/28/2019 08:10 AM    CO2 23 11/28/2019 08:10 AM    GAP 12 11/28/2019 08:10 AM    BUN 15 11/28/2019 08:10 AM    CR 1.05 (H) 11/28/2019 08:10 AM    GLU 174 (H) 11/28/2019 08:10 AM    Lab Results   Component Value Date/Time    CA 9.2 11/28/2019 08:10 AM    ALBUMIN 4.2 11/28/2019 08:10 AM    TOTPROT 6.9 11/28/2019 08:10 AM    ALKPHOS 32 11/28/2019 08:10 AM    AST 14 11/28/2019 08:10 AM    ALT 14 11/28/2019 08:10 AM    TOTBILI 0.4 11/28/2019 08:10 AM    GFR 57 (L) 11/28/2019 08:10 AM    GFRAA >60 11/28/2019 08:10 AM                  Assessment and Plan:    1. 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   2.  Meri underwent right modified radical mastectomy on 08/21/2019.  Tumor size was 1.8 cm, invasive ductal carcinoma.  Grade 2.  1 out of 34 lymph nodes was positive.  The size of LN metastasis was 5 mm.  ER was 96%, PR 99%,, HER-2 0+ by IHC. Oncotype Dx RS 11.   3.  Since Elinor is premenopausal and has node positive disease, we recommend adjuvant chemotherapy.  After discussing  the options of AC-T chemotherapy versus TC chemotherapy, we decided to proceed with TC for four cycles.  We discussed the data based on ABC trials and breast cancer.  Elspeth has significant history of heart disease in the family and is very concerned about Adriamycin causing heart damage.  Therefore TC seems like better choice with equivalent efficacy based on trials.  She will receive cycle #4/4 TC today.   4. Bone pain with neulasta; she was encouraged to take Motrin 800mg  TID.   5. Fatigue; continue to hydrate and rest when needed.   6. Hematochezia; we will discuss possible colonoscopy after chemotherapy is completed.   7. Neuropathy; no intervention at this time. We will continue to monitor.   8. After chemotherapy, we will recommend adjuvant endocrine therapy which will be tamoxifen with or without ovarian function suppression.  She has Mirena IUD which we will address after chemotherapy is completed.    9. She is scheduled with Dr Isabella Stalling for radiation 12/18/2019.   10. We will schedule an EOT visit.   11.  RTC in 3 weeks to discuss endocrine therapy.      My collaborating MD Dr Raul Del.     For up to date information on the COVID-19 virus, visit the [facility] website. [email or web address]  ? General supportive care during cold and flu season and infection prevention reminders:   o Wash hands often with soap and water for at least 20 seconds  o Cover your mouth and nose  o Social distancing: try to maintain 6 feet between you and other people  o Stay home if sick and symptoms mild or manageable  - If you must be around people wear a mask    ? If you are having symptoms of a lower respiratory infection (cough, shortness of breath) and/or fever AND either traveled in last 30 days (internationally or to region of exposure) OR known exposure to patient with COVID19:    o Call your primary care provider for questions or health needs.   - Tell your doctor about your recent travel and your symptoms    o In a medical emergency, call 911 or go to the nearest emergency room.    Total time 30 minutes.  Estimated counseling time 25 minutes.  Counseled Samanth regarding breast cancer and follow up care.    Elenore Paddy Darragh Nay, APRN-NP

## 2019-11-28 NOTE — Progress Notes
CHEMO NOTE  Verified chemo consent signed and in chart.    Verified initiate chemo order in O2    Blood return positive via: Peripheral (24 ga)    BSA and dose double checked (agree with orders as written) with: yes Chrystine Oiler, RN    Labs/applicable tests checked: CBC and Comprehensive Metabolic Panel (CMP)    Chemo regime: C4D1    DOCEtaxeL (TAXOTERE) 204 mg in sodium chloride 0.9% (NS) 520.4 mL IVPB (non-DEHP)     cyclophosphamide (CYTOXAN) 1,600 mg in sodium chloride 0.9% (NS) 330 mL IVPB    Rate verified and armband double checkwith second RN: yes    Patient education offered and stated understanding. Denies questions at this time.      Patient arrived to CC treatment after being seen in clinic; please refer to clinic note for assessment details. Premeds given per treatment plan. Taxotere, cytoxan, and udenyca given w/o complications, patient tolerated well. PIV removed. Patient declined copy of labs and AVS. All questions and concerns addressed. Patient left CC treatment in stable condition.

## 2019-12-11 ENCOUNTER — Ambulatory Visit: Admit: 2019-12-11 | Discharge: 2019-12-11 | Payer: BC Managed Care – PPO

## 2019-12-19 ENCOUNTER — Ambulatory Visit: Admit: 2019-12-19 | Discharge: 2019-12-19 | Payer: BC Managed Care – PPO

## 2019-12-19 DIAGNOSIS — C50411 Malignant neoplasm of upper-outer quadrant of right female breast: Secondary | ICD-10-CM

## 2019-12-19 NOTE — Progress Notes
Date of Service: 12/19/2019    Subjective:             Jennifer Durham is a 44 y.o. female.  Cancer Staging  Malignant neoplasm of upper-outer quadrant of right breast in female, estrogen receptor positive (HCC)  Staging form: Breast, AJCC 8th Edition  - Clinical stage from 07/04/2019: Stage IA (cT1c, cN0(f), cM0, G2, ER+, PR+, HER2-) - Signed by Guy Begin, PA-C on 07/12/2019  - Pathologic stage from 08/21/2019: Stage IA (pT1c, pN1a, cM0, G2, ER+, PR+, HER2-) - Signed by Guy Begin, PA-C on 08/30/2019    History of Present Illness  Patient presents to clinic for follow-up. She continues to do well. She is planning to be in a study to determine if she will require radiation therapy. She needs 100cc removed from each tissue expander. Patient feels that the left TE has flipped/rotated.   Allergies   Allergen Reactions   ? Tegaderm RASH        Review of Systems   Constitutional: Positive for fatigue.   HENT: Negative.    Eyes: Negative.    Respiratory: Negative.    Cardiovascular: Negative.    Gastrointestinal: Negative.    Endocrine: Negative.    Genitourinary: Negative.    Musculoskeletal: Negative.    Skin: Negative.    Allergic/Immunologic: Negative.    Neurological: Negative.    Hematological: Negative.    Psychiatric/Behavioral: Negative.      Medical History:   Diagnosis Date   ? Breast cancer (HCC) 07/04/2019    right   ? Hypertension    ? Insulin resistance    ? PCOS (polycystic ovarian syndrome)    ? Seasonal allergies     Frequent sinusitis     Surgical History:   Procedure Laterality Date   ? HX TONSILLECTOMY  2001    turbinates/adenoids 2015   ? HX TONSIL AND ADENOIDECTOMY  2011   ? LEFT PERONEAL TENDON REPAIR Left 12/15/2016    Performed by Vopat, Lowry Ram, MD at [facility]   ? Left Skin Sparing Mastectomy Left 08/21/2019    Performed by Renford Dills, MD at [facility]   ? IDENTIFICATION SENTINEL LYMPH NODE Right 08/21/2019    Performed by Renford Dills, MD at [facility]   ? INJECTION RADIOACTIVE TRACER FOR SENTINEL NODE IDENTIFICATION Right 08/21/2019    Performed by Renford Dills, MD at [facility]   ? RADIOLOGICAL EXAM SURGICAL SPECIMEN Right 08/21/2019    Performed by Renford Dills, MD at [facility]   ? MASTECTOMY - MODIFIED RADICAL WITH AXILLARY LYMPH NODE DISSECTION Right 08/21/2019    Performed by Renford Dills, MD at [facility]   ? RECONSTRUCTION BREAST WITH TISSUE EXPANDER AND SUBSEQUENT EXPANSION - IMMEDIATE/ DELAYED Bilateral 08/21/2019    Performed by Marlinda Mike, MD at [facility]   ? IMPLANTATION BIOLOGIC IMPLANT FOR SOFT TISSUE REINFORCEMENT Bilateral 08/21/2019    Performed by Marlinda Mike, MD at [facility]     Family History   Problem Relation Age of Onset   ? Heart Attack Mother    ? Cancer Mother    ? Diabetes Mother    ? Cancer-Breast Mother 26   ? Heart Disease Mother    ? Hypertension Mother    ? Heart Disease Father    ? Hypothyroid Father    ? Hypertension Father    ? Thyroid Disease Father    ? Hypertension Brother    ?  Cancer-Breast Maternal Aunt 50   ? Cancer-Ovarian Maternal Aunt 50   ? Diabetes Maternal Aunt    ? Arthritis-rheumatoid Maternal Aunt    ? Diabetes Maternal Grandmother    ? Heart Disease Maternal Grandfather      Social History     Socioeconomic History   ? Marital status: Married     Spouse name: Not on file   ? Number of children: Not on file   ? Years of education: Not on file   ? Highest education level: Not on file   Occupational History   ? Not on file   Tobacco Use   ? Smoking status: Never Smoker   ? Smokeless tobacco: Never Used   Vaping Use   ? Vaping Use: Never used   Substance and Sexual Activity   ? Alcohol use: Yes     Alcohol/week: 1.0 standard drink     Types: 1 Shots of liquor per week     Comment: 1x per month   ? Drug use: No   ? Sexual activity: Not on file   Other Topics Concern   ? Not on file   Social History Narrative   ? Not on file     Vaping/E-liquid Use   ? Vaping Use Never User      Objective:         ? acetaminophen (TYLENOL EXTRA STRENGTH) 500 mg tablet Take 1,000 mg by mouth every 6 hours as needed for Pain. Max of 4,000 mg of acetaminophen in 24 hours.   ? dexAMETHasone (DECADRON) 4 mg tablet Take with food. Take 2 tablets by mouth with breakfast and at noon the day before, the day of and the day after chemo. Repeat with next cycle.   ? levonorgestrel (MIRENA) 20 mcg/24 hr (5 years) intrauterine device   1 EA, X 1 DOSE, Intrauteral, 1 EA, 0 Number of Refills, DEV   ? LORazepam (ATIVAN) 0.5 mg tablet Take 1 tablet by mouth at night as needed for anxiety,  Nausea and insomnia.   ? metFORMIN (GLUCOPHAGE) 1,000 mg tablet Take 1,000 mg by mouth twice daily with meals.   ? montelukast (SINGULAIR) 10 mg tablet Take 10 mg by mouth at bedtime daily.   ? prochlorperazine maleate (COMPAZINE) 10 mg tablet TAKE ONE TABLET BY MOUTH EVERY 6 HOURS   ? triamterene-hydrochlorothiazide (MAXZIDE) 75-50 mg tablet Take 1 tablet by mouth daily.     Vitals:    12/19/19 1148   BP: 133/77   Pulse: 117   Weight: (!) 148.3 kg (327 lb)   Height: 177.8 cm (70)     Body mass index is 46.92 kg/m?Marland Kitchen     Physical Exam  Vitals reviewed.   Constitutional:       Appearance: Normal appearance. She is well-groomed.   HENT:      Head: Normocephalic.   Pulmonary:      Effort: Pulmonary effort is normal.   Chest:      Comments: Bilateral breast scars well healed. Left breast TE is flipped   Skin:     General: Skin is warm and dry.   Neurological:      Mental Status: She is alert.   Psychiatric:         Mood and Affect: Mood normal.         Behavior: Behavior normal. Behavior is cooperative.         Thought Content: Thought content normal.         Judgment:  Judgment normal.          Assessment and Plan:  Patient s/p bilateral SSM and TE placement with mesh.  Prepped chest bilaterally with CHG. Using a wing tip needle 100cc of sterile saline aspirated from devices.  Bilateral TE total 650cc.

## 2019-12-19 NOTE — Progress Notes
Radiation Oncology Follow Up Note  Date: 12/19/2019       Jennifer Durham is a 44 y.o. female.     The encounter diagnosis was Malignant neoplasm of upper-outer quadrant of right breast in female, estrogen receptor positive (HCC).  Staging: Cancer Staging  Malignant neoplasm of upper-outer quadrant of right breast in female, estrogen receptor positive (HCC)  Staging form: Breast, AJCC 8th Edition  - Clinical stage from 07/04/2019: Stage IA (cT1c, cN0(f), cM0, G2, ER+, PR+, HER2-) - Signed by Guy Begin, PA-C on 07/12/2019  - Pathologic stage from 08/21/2019: Stage IA (pT1c, pN1a, cM0, G2, ER+, PR+, HER2-) - Signed by Guy Begin, PA-C on 08/30/2019        History of Present Illness  Jennifer Durham is a 44 y.o. female with Stage IA (cT1c, cN0(f), cM0, G2, ER+, PR+, HER2-).  Stage IA (pT1c, pN1a, cM0, G2, ER+, PR+, HER2-)  I personally reviewed her previous medical records during imaging, pathology, and other provider records, to obtain some of this information. Her oncologic history is briefly as follows:  ?  The patient is a 44 year old female with family history of breast cancer in her mother and maternal aunts.  She reports that she first noticed some dimpling/changes in the right breast in May 2021.  She had already been scheduled to have a screening mammogram at that time and it was converted to a bilateral diagnostic mammogram.the diagnostic mammogram was performed in [place], [place], on July 04, 2019.  It identified a spiculated mass with architectural distortion measuring up to 2 cm in the upper medial right breast, 10-11 o'clock, 7.7 cm posterior to the nipple there were also noted to be some slightly prominent asymmetric right axillary lymph nodes.  On same-day ultrasound, the tumor was measured at 1.3 x 1.7 x 1.6 cm.  There was increased cortical mantle thickening of the right axillary lymph node measuring up to 4 mm.  BI-RADS 5.  A right breast and right axillary biopsy performed in [place] on July 04, 2019 carcinoma.  Outside path review was performed at [facility]  which confirmed grade 2 IDC with a focal lobular growth pattern and lymphoid tissue with no evidence of carcinoma in the right axillary specimen.  She was seen by Dr. Robina Ade, and Dr. Raul Del, in the [place] breast clinic on July 16, 2019 for evaluation.  New imaging was performed the same day at [facility].  It revealed a small mass in the probable 9 o'clock position right breast posterior depth, 2 cm inferior and 2 cm posterior to the known malignancy.  In the 930 to 10 o'clock position of the right breast middle to posterior depth a 1.7 x 1.9 cm spiculated mass with internal tissue marker clip corresponding to biopsy-proven malignancy.  In the 830 to 9 o'clock position of the right breast at posterior depth there was a 0.8 cm lobular equal density mass which is not definitively seen on the 2D mammogram images from fibber 25th 2015.  Targeted ultrasound at [facility] on July 16, 2019 showed a 1.4 x 1.8 x 1.6 cm irregular hypoechoic mass at the 930 location 9 cm from the nipple with internal tissue marker clip.  It also noted in the right breast 9:00, 12 cm from the nipple, a 0.8 cm morphologically normal small intramammary lymph node corresponding to the additional mammographic mass identified.  No suspicious right axillary lymph nodes were seen.  She was recommended for upfront surgery.  MRI of the bilateral breasts  and genetic testing were ordered.  ?July 16, 2019 [facility] myRisk genetic testing did not identify a clinically significant mutation.  ?07/20/2019 MRI bilateral breasts confirmed the presence of an irregular spiculated mass in the right upper outer breast compatible with previous biopsy of malignancy and no enlarged or morphologically suspicious lymph nodes.  ?On August 21, 2019, the patient underwent bilateral skin sparing mastectomy and right sentinel lymph node biopsy, axilla lymph node dissection, and reconstruction. 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  A 5 mm focus of metastatic carcinoma was found in 1 of 4 sentinel lymph nodes.  0 of 27 additional axillary nodes were involved. pT1c N1a M0 (1 of 34 LN+) M0. No LVI, No EIC, No ENE.  ?  She has been seen and evaluated by Dr. Park Breed and has been recommended to receive adjuvant chemotherapy.  She is currently scheduled for cycle 1 of 4 of TC chemotherapy on September 26, 2019.  Subsequent cycles scheduled for October 17, 2019, November 07, 2019, and fourth cycle on November 28, 2019.     She was initially seen in consultation on September 13, 2019, for discussion of the role for postmastectomy radiation therapy.       Review of Systems   Constitutional: Negative.    HENT: Negative.    Eyes: Negative.    Respiratory: Negative.  Negative for shortness of breath.    Cardiovascular: Negative.  Negative for chest pain.   Gastrointestinal: Negative.  Negative for nausea and vomiting.   Endocrine: Negative.    Genitourinary: Negative.    Musculoskeletal: Negative.    Skin: Negative.    Allergic/Immunologic: Negative.    Neurological: Negative.  Negative for headaches.   Hematological: Negative.    Psychiatric/Behavioral: Negative.    All other systems reviewed and are negative.        Subjective:          Ms. Mikkelson returns today for follow-up to review indications for postmastectomy radiation therapy and to discuss possible clinical trial enrollment.  She is accompanied with her best friend.  She denies any headaches, dizziness, nausea, vomiting, chest pain, shortness of breath, cough, sputum changes, fevers, chills, night sweats.  She is working as a Runner, broadcasting/film/video and is very interested in trying to get back to work as much as possible with minimal interference in her work life.  She also has an appointment this afternoon with Dr. Tama Gander team to have any fluid removed from the temporary expanders if needed for radiation therapy.    Objective:         ? acetaminophen (TYLENOL EXTRA STRENGTH) 500 mg tablet Take 1,000 mg by mouth every 6 hours as needed for Pain. Max of 4,000 mg of acetaminophen in 24 hours.   ? dexAMETHasone (DECADRON) 4 mg tablet Take with food. Take 2 tablets by mouth with breakfast and at noon the day before, the day of and the day after chemo. Repeat with next cycle.   ? levonorgestrel (MIRENA) 20 mcg/24 hr (5 years) intrauterine device   1 EA, X 1 DOSE, Intrauteral, 1 EA, 0 Number of Refills, DEV   ? LORazepam (ATIVAN) 0.5 mg tablet Take 1 tablet by mouth at night as needed for anxiety,  Nausea and insomnia.   ? metFORMIN (GLUCOPHAGE) 1,000 mg tablet Take 1,000 mg by mouth twice daily with meals.   ? montelukast (SINGULAIR) 10 mg tablet Take 10 mg by mouth at  bedtime daily.   ? prochlorperazine maleate (COMPAZINE) 10 mg tablet TAKE ONE TABLET BY MOUTH EVERY 6 HOURS   ? triamterene-hydrochlorothiazide (MAXZIDE) 75-50 mg tablet Take 1 tablet by mouth daily.     Vitals:    12/19/19 1002   BP: 135/80   Pulse: 114   Resp: 18   Temp: (!) 35.9 ?C (96.7 ?F)   SpO2: 98%   Weight: (!) 148.6 kg (327 lb 9.6 oz)   PainSc: Zero     Body mass index is 44.42 kg/m?Marland Kitchen     Pain Score: Zero        Fatigue Scale: 5    KARNOFSKY PERFORMANCE SCORE:  100% Normal, no complaints     Physical Exam  Vitals and nursing note reviewed.   Constitutional:       General: She is not in acute distress.     Appearance: Normal appearance. She is well-developed. She is obese. She is not ill-appearing.      Comments: Alopecia, she is wearing awake   HENT:      Head: Normocephalic and atraumatic.   Eyes:      Extraocular Movements: Extraocular movements intact.      Pupils: Pupils are equal, round, and reactive to light.   Cardiovascular:      Rate and Rhythm: Normal rate and regular rhythm.      Pulses: Normal pulses.      Heart sounds: Normal heart sounds.   Pulmonary:      Effort: Pulmonary effort is normal. Breath sounds: Normal breath sounds.   Chest:      Breasts:         Right: Normal.         Left: Normal.          Comments: Status post bilateral skin sparing mastectomy with temporary expander placement.  Tissues are clean, dry, intact well-healed and healthy.  Musculoskeletal:      Cervical back: Normal range of motion. No rigidity.   Skin:     General: Skin is warm and dry.   Neurological:      General: No focal deficit present.      Mental Status: She is alert and oriented to person, place, and time.   Psychiatric:         Behavior: Behavior is cooperative.         Thought Content: Thought content normal.              Laboratory:    Comprehensive Metabolic Profile    Lab Results   Component Value Date/Time    NA 139 11/28/2019 08:10 AM    K 3.7 11/28/2019 08:10 AM    CL 104 11/28/2019 08:10 AM    CO2 23 11/28/2019 08:10 AM    GAP 12 11/28/2019 08:10 AM    BUN 15 11/28/2019 08:10 AM    CR 1.05 (H) 11/28/2019 08:10 AM    GLU 174 (H) 11/28/2019 08:10 AM    Lab Results   Component Value Date/Time    CA 9.2 11/28/2019 08:10 AM    ALBUMIN 4.2 11/28/2019 08:10 AM    TOTPROT 6.9 11/28/2019 08:10 AM    ALKPHOS 32 11/28/2019 08:10 AM    AST 14 11/28/2019 08:10 AM    ALT 14 11/28/2019 08:10 AM    TOTBILI 0.4 11/28/2019 08:10 AM    GFR 57 (L) 11/28/2019 08:10 AM    GFRAA >60 11/28/2019 08:10 AM        CBC w diff  Lab Results   Component Value Date/Time    WBC 17.7 (H) 11/28/2019 08:10 AM    RBC 3.25 (L) 11/28/2019 08:10 AM    HGB 9.5 (L) 11/28/2019 08:10 AM    HCT 28.8 (L) 11/28/2019 08:10 AM    MCV 88.8 11/28/2019 08:10 AM    MCH 29.4 11/28/2019 08:10 AM    MCHC 33.1 11/28/2019 08:10 AM    RDW 18.3 (H) 11/28/2019 08:10 AM    PLTCT 373 11/28/2019 08:10 AM    MPV 7.3 11/28/2019 08:10 AM    Lab Results   Component Value Date/Time    NEUT 90 (H) 11/28/2019 08:10 AM    ANC 16.10 (H) 11/28/2019 08:10 AM    LYMA 5 (L) 11/28/2019 08:10 AM    ALC 0.80 (L) 11/28/2019 08:10 AM    MONA 5 11/28/2019 08:10 AM    AMC 0.80 11/28/2019 08:10 AM    EOSA 0 11/28/2019 08:10 AM    AEC 0.00 11/28/2019 08:10 AM    BASA 0 11/28/2019 08:10 AM    ABC 0.00 11/28/2019 08:10 AM            Imaging:   MAMMO LYMPH NODE INJ RADIOTRACER RT (OR)  This order has been auto finalized and does not contain a result.  BREAST SPEC PATH ONLY/NO RAD READ  This order has been auto finalized and does not contain a result.  BREAST SPEC PATH ONLY/NO RAD READ  This order has been auto finalized and does not contain a result.       Path:  PATHOLOGY REPORT   Date Value Ref Range Status   08/21/2019   Final    [facility]  [email or web address]    Department of Pathology and Laboratory Medicine  [address], [place], [place] [address]  Surgical Pathology Office:  [phone]  Fax:  [phone]  SURGICAL PATHOLOGY REPORT    NAME: SUMMA, LOTSPEICH PATH #: [id number] MR #: [id number] SPECIMEN  CLASS: SI BILLING #: [id number] ALT ID #:  LOCATION: [facility] DATE OF  PROCEDURE: 08/21/2019 AGE:  43 SEX: F DATE RECEIVED: 08/21/2019 DOB:  Feb 15, 1975  TIME RECEIVED:  11:32 PHYSICIAN: LYNDSEY J KILGORE DATE OF  REPORT: 08/24/2019 COPY TO: ERIC C LAI, MD DATE OF PRINTING: 08/24/2019   Procedures/Addenda  Addendum     Date Ordered:     08/24/2019     Status:  Signed Out  Date Complete:     08/24/2019  By: Sol Blazing, MD  Date Reported:     08/24/2019             Addendum Comment  Breast Cancer Prognostic Panel by Immunohistochemistry (IHC) using  Quantitative Computer-Assisted Image Analysis    Testing performed on block Number: A13  Estrogen Receptor (ER):          96%     Positive     Stain Intensity:  Strong  Progesterone Receptor (PR):     99%     Positive     Stain Intensity:  Strong  Her2:                    0     Negative    The original image analysis report is scanned in Epic under Results  Review.    Disclaimer: [facility] is certified  under the Clinical Laboratory Improvement Amendments of 1988 (CLIA) to  perform high complexity clinical laboratory testing. The Food and  Drug  Administration (FDA) cleared estrogen receptor/progesterone receptor  (ER/PR) assays and the FDA approved Her2 assay are performed on  formalin-fixed, paraffin-embedded tissue sections using the Ventana  Benchmark Ultraview Universal DAB detection kit with Ventana Confirm ER  antibody (clone SP1), Ventana Confirm PR antibody (clone [id number]), and Ventana  Pathway Her2/neu antibody (clone [id number]). Ki-67 is performed using Dako Flex  Ki-67 antibody (clone MIB-1) with Dako Flex Dab detection kit on  autostainer Link 48. Interpretations of the ER/PR and Her2 results follow  the most current American Society of Clinical Oncology/College of American  Pathologists (ASCO/CAP) guidelines. Estrogen receptor and progesterone  receptor studies are reported as a percentage of positive tumor nuclei  that express immunoreactivity and intensity of staining. For breast  cancer, estrogen receptor and progesterone receptor positivity 1% or  greater is considered positive. HER-2/neu that demonstrates incomplete  membrane staining that is faint/barely perceptible and within d10% of  tumor cells is scored as 0 and within >10% of tumor cells is scored as 1+.  A score of 0 or 1+ is considered negative. Weak to moderate complete  membrane staining in >10% of tumor cells or complete membrane staining  that is intense but within d10% of tumor cells is scored as 2+ equivocal  and will be reflexed to FISH. A score of 3+ requires strong and complete  membrane staining in greater than 10% of tumor cells and is considered  positive. Known positive and negative control tissues show appropriate  staining. Cold ischemic time and formalin fixation time of the tissue meet  the ASCO/CAP requirements unless noted in the pathology report. This assay  has not been validated on decalcified tissues. The image analysis is  performed using Ventana Virtuoso Image Analysis system (v5.6.2). ER  Negative, Low Positive, and Weak staining results are adjudicated  according to the steps in the Standard Operating Procedure of the [facility].                                                         ########################################################################  Final Diagnosis:    A. Breast, right breast skin sparing mastectomy, short stitch superior,  long stitch lateral, mastectomy:  Invasive ductal carcinoma, histologic grade 2, see comment.  Ductal carcinoma, nuclear grade 2, solid and cribriform patterns.  Fibroadenoma.       B. Lymph nodes, right axillary sentinel lymph node #1, biopsy:    There is no evidence of malignancy in two lymph nodes.  (0/2)  Deeper sections and a pancytokeratin immunostain are negative in  support of the above diagnosis.      C. Lymph node, right axillary sentinel lymph node #2, biopsy:    There is no evidence of malignancy in one lymph node.  (0/1)  Deeper sections and a pancytokeratin immunostain are negative in  support of the above diagnosis.      D. Lymph node, right axillary sentinel lymph node #3, biopsy:    Metastatic carcinoma in one lymph node (1/1), 5 mm in greatest  dimension.  Deeper sections and a pancytokeratin immunostain are positive in  support of the above diagnosis.      E. Lymph node, right axillary sentinel lymph node #4:    There is no evidence of malignancy in one lymph node.  (0/1)  Deeper sections and a  pancytokeratin immunostain are negative in  support of the above diagnosis.      F. Lymph node, right axillary sentinel lymph node #5, biopsy:    There is no evidence of malignancy in one lymph node.  (0/1)  Deeper sections and a pancytokeratin immunostain are negative in  support of the above diagnosis.      G. Fibroadipose tissue, right additional anterior lateral margin clip  marks new true margin, excision:    Unremarkable fibroadipose tissue.    H. Lymph node, right low axillary lymph node, excision:    There is no evidence of malignancy in one lymph node.  (0/1)    I. Breast, left breast skin sparing mastectomy, short stitch superior,  long stitch lateral, mastectomy:    Fibrocystic changes.  There is no evidence of malignancy.      J. Lymph nodes, right axillary contents level I and II, axillary  dissection:    Negative for malignancy in twenty-seven lymph nodes (0/27).  Biopsy site present in one lymph node.              K. Skin, right mastectomy skin, excision:  Dermal fibrosis.    Comment:  INVASIVE CARCINOMA OF THE BREAST: RESECTION  CAP Version: Invasive Breast 4.4.0.0    Procedure: Skin sparing mastectomy   Laterality: Right  Tumor Site: 9:30, 8 cm FTN  Histologic Type: Invasive ductal carcinoma  Size of Invasive Component: 1.8 x 1.7 x 1.2 cm     Surgical Margins:  Mastectomy  For invasive carcinoma: All margins are greater than 2 cm  For DCIS: All margins are greater than 2 cm              Histologic Grade (Nottingham Histologic Score): II/III  Tubule Formation: 3  Nuclear Grade: 3  Mitotic Count (40x objective): 1  Total Nottingham Score: 7/9  Ductal Carcinoma In-situ (DCIS): Present  Extensive intraductal component (>25%): Absent  DCIS within and/or adjacent to invasive carcinoma: Yes   DCIS separate from invasive carcinoma: No    Lobular Carcinoma In-situ (LCIS): Absent  Lymph-Vascular Invasion: Not identified  Nipple Involvement: Absent   Skin Involvement: Absent   Skeletal Muscle: Not involved   Lymph Node Sampling:  Sentinel lymph node with axillary dissection  Total number of lymph nodes examined: 34  Number of lymph nodes with macrometastases (>2 mm): 1  Number of lymph nodes with micrometastases (>0.2 mm to 2 mm): 0  Number of lymph nodes with isolated tumor cells (<0.2 mm and  <200 cells): 0  Size of largest metastatic deposit (millimeters): 5 mm  Extranodal extension: Not identified  Treatment Effect in the Breast: No known presurgical therapy  Prognostic markers: The results will be issued as an addendum.  Time between tumor removal and placement into formalin < 1 hour: Yes  Fixation Time between 6-72 hours: Yes    Pathologic Staging (pTNM, AJCC 8th edition):  pT1c N1a    Primary Tumor (Invasive Carcinoma) (pT)   pT1c:     Tumor >10 mm but d20 mm in greatest dimension    Regional Lymph Nodes (pN)  pN1a:     Metastases in 1 to 3 axillary lymph nodes, at least 1 metastasis  larger than 2.0 mm    Distant Metastasis (M)    Not applicable    The pathologic stage assigned here should be regarded as provisional, as  it reflects only current pathologic data and does not incorporate full  knowledge of the patient's clinical status and/or  prior pathology.    Block for Biomarker Testing: A13       Attestation:  By this signature, I attest that I have personally formu...   ]         Assessment and Plan:    44 y.o. 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  pT1c pN1a.  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  She is status post bilateral skin sparing mastectomy with temporary expander placement, right sentinel lymph node biopsy, axillary lymph node dissection and 1 of 34 lymph nodes positive for a 5 mm metastatic deposit, no LVI, no ENE, no EIC.  Adjuvant TC chemotherapy x4 cycles is scheduled to be initiated on 09/26/2019.  Fourth cycle completed 11/28/2019.  She is a candidate for adjuvant postmastectomy radiation therapy to improve local regional control and breast cancer specific mortality.      Ms. Lemert returns today for follow-up.  I reviewed with her in the presence of her companion the rationale for postmastectomy radiation therapy and its benefit in reducing local regional recurrence and improving breast cancer specific mortality.    At the time of her initial consultation I had talked with her about clinical trials currently available here at [facility] including the IIT hypofractionation clinical trial for patients receiving postmastectomy radiation therapy to include the regional nodes.  A brief description of this trial is provided here:    [id number]-[id number]-HypoFracRT; (ZOX#[id number]) hypofractionated radiation therapy for patients with breast cancer receiving regional nodal radiation.  The primary goal to determine lymphedema rates in patients requiring regional nodal radiation who receive hypofractionated radiation.  Lymphedema is diagnosed with the patient's arm circumference measures 10% or more as compared to pretreatment or baseline arm circumference measurements.  We hypothesized that patients receiving a shorter course of radiation will have reduced lymphedema as compared to historical controls receiving conventional radiation.    There is another clinical trial here at [facility] for which I think this patient would be ideally suited.  It is trial evaluating omission of radiation therapy in patients with favorable risk node positive hormonally sensitive disease.  A brief description of this trial is provided here:    MA.39 (WUJ#[id number]) Tailor RT: A randomized trial of regional radiotherapy and biomarker low risk node positive breast cancer.  To compare the breast cancer recurrence-free interval (BCRFI) between patients that received regional RT or not, defined as time from randomization to time of invasive recurrent disease in the ipsilateral chest wall, breast, regional nodes, distant sites or death due to breast cancer.    Ms. Shipper is status post bilateral skin sparing mastectomies with temporary expander placement.  Final pathology showed she had a small, grade 2 invasive ductal carcinoma with a focal area of lobular growth.  She had grade 2 DCIS, but no EIC.  No LVI.  She had 1 of 5 sentinel nodes involved with a 5 mm macrometastasis without extranodal extension.  27 additional axillary lymph nodes were removed and none of them were involved with malignancy.  Her tumor very strongly ER positive and PR positive.  Her Oncotype DX recurrence score was low at 11.  She has received adjuvant chemotherapy, will receive adjuvant antiestrogen therapy.  Other than a relatively young age, she has no significant high risk or intermediate risk features on pathology review.  While radiation therapy has been shown to improve disease-free survival and recurrence free survival, I think there is a strong likelihood that her risk is already  relatively low and may not warrant the potential side effects and long-term potential complications.  I explained to the patient that we do not currently have data to demonstrate omission of radiation therapy can be safely considered.  This clinical trial, MA.39, has been specifically designed to try to answer this question.  If the patient would like to be considered for possibly omitting radiation therapy I would be comfortable doing so as long as it was within the context of this clinical trial.  One of the advantages of this particular clinical trial is that the randomization to the radiation therapy arm does allow for a hypofractionated approach similar to what will be utilized in the  IT hypofractionated RT trial described above.  If she does get randomized to the radiation therapy arm we could still treat her over a short of time.  Which would be in line with her goals of getting her treatment completed as quickly as possible so that it does not interfere with her work.  If she is randomized to the omission of radiation arm then she would not need to have any fluid removed from her temporary expanders and she could initiate her antiestrogen therapy and proceed with her reconstruction plans as per her surgeons scheduling allows.    After considerable discussion and answering questions.  The patient states that she would be very interested in being screened for enrollment in MA.39.  I will send a message to Dr. Fran Lowes to let them know that they may wish to wait until after she has been randomized to determine whether or not fluid needs to be removed from the temporary expander or can be left in its currently filled state.  I have also sent an email to my research coordinator so that she can begin the screening process as soon as she returned from the Thanksgiving holiday.    All the patient's questions were answered to her satisfaction.  We will be in touch once the patient has been screened and enrolled in the trial and randomization has been taken place.  I did review with her the simulation and treatment planning process as well as the potential side effects of radiation therapy if she does in fact get randomized to the radiation arm.  We discussed the logistics of treatment planning and treatment delivery including simulation and daily treatments.    The [facility] [address] location is currently in the process of installing a new CT simulator.  As a result, there is a strong possibility that the patient will need to travel to the [facility] campus for the radiation therapy CT simulation.  I reassured the patient, that all of the treatment planning, and the actual treatments themselves would be performed at [facility] [address]  The images obtained during simulation would be electronically shipped from [facility] to the [facility] [address] location, for treatment planning and delivery.  I emphasized that all of the treatment planning as well as the treatments themselves will be performed at [facility] [address] The patient knowledge understanding.  Directions to that location, as well as a map, will be provided to the patient if she is randomized to radiation therapy.      I am grateful for the opportunity to participate in the care of Ms. Hass.    Please do not hesitate contact me if there are questions regarding today's visit.    Elby Showers, MD  I spent a total of 40 minutes today in direct patient evaluation with more than 50% spent in treatment plan formulation, counseling, symptom management discussion and consultation as outlined above.     Parts of this note were created using voice recognition software.  Please excuse any grammatical or typographical errors.

## 2019-12-26 ENCOUNTER — Encounter: Admit: 2019-12-26 | Discharge: 2019-12-26 | Payer: BC Managed Care – PPO

## 2019-12-31 ENCOUNTER — Encounter: Admit: 2019-12-31 | Discharge: 2019-12-31 | Payer: BC Managed Care – PPO

## 2019-12-31 DIAGNOSIS — E282 Polycystic ovarian syndrome: Secondary | ICD-10-CM

## 2019-12-31 DIAGNOSIS — C50919 Malignant neoplasm of unspecified site of unspecified female breast: Secondary | ICD-10-CM

## 2019-12-31 DIAGNOSIS — I10 Essential (primary) hypertension: Secondary | ICD-10-CM

## 2019-12-31 DIAGNOSIS — C50911 Malignant neoplasm of unspecified site of right female breast: Secondary | ICD-10-CM

## 2019-12-31 DIAGNOSIS — E8881 Metabolic syndrome: Secondary | ICD-10-CM

## 2019-12-31 DIAGNOSIS — J302 Other seasonal allergic rhinitis: Secondary | ICD-10-CM

## 2019-12-31 MED ORDER — TAMOXIFEN 20 MG PO TAB
20 mg | ORAL_TABLET | Freq: Every day | ORAL | 3 refills | Status: AC
Start: 2019-12-31 — End: ?

## 2020-01-01 ENCOUNTER — Encounter: Admit: 2020-01-01 | Discharge: 2020-01-01 | Payer: BC Managed Care – PPO

## 2020-01-01 DIAGNOSIS — E282 Polycystic ovarian syndrome: Secondary | ICD-10-CM

## 2020-01-01 DIAGNOSIS — I10 Essential (primary) hypertension: Secondary | ICD-10-CM

## 2020-01-01 DIAGNOSIS — E8881 Metabolic syndrome: Secondary | ICD-10-CM

## 2020-01-01 DIAGNOSIS — J302 Other seasonal allergic rhinitis: Secondary | ICD-10-CM

## 2020-01-01 DIAGNOSIS — C50919 Malignant neoplasm of unspecified site of unspecified female breast: Secondary | ICD-10-CM

## 2020-01-01 NOTE — Telephone Encounter
Called PT and scheduled return appts.

## 2020-01-07 ENCOUNTER — Encounter: Admit: 2020-01-07 | Discharge: 2020-01-07 | Payer: BC Managed Care – PPO

## 2020-01-07 DIAGNOSIS — C50911 Malignant neoplasm of unspecified site of right female breast: Secondary | ICD-10-CM

## 2020-01-07 DIAGNOSIS — Z006 Encounter for examination for normal comparison and control in clinical research program: Secondary | ICD-10-CM

## 2020-01-07 DIAGNOSIS — Z9189 Other specified personal risk factors, not elsewhere classified: Secondary | ICD-10-CM

## 2020-01-07 NOTE — Progress Notes
LYMPHEDEMA DATASHEET-FOLLOW-UP    DATE:  01/07/2020    PATIENT NAME:  Jennifer Durham  DATE OF BIRTH:  1975/02/04  MRN:   [id number]  Follow-Up Visit:  5 month  Lymphedema Diagnosis?:  Yes: Stage 1   DIAGNOSTIC DEFINITION OF LYMPHEDEMA:     > = 10% change from baseline volume and > 2cm change from baseline volume     LYMPHEDEMA RISK FACTORS(CHECK ALL THAT APPLY):  Bilateral SSM/Right ALND (1/34)/TEs- 08/21/19, Adjuvant TC- completed 11/28/19   RECENT INFECTIONS?:  No   TYPE OF INFECTION(S):  N/A   USE OF COMPRESSION SLEEVE:  Unilateral    Jennifer Durham presents to clinic today for MA.39 trial arm measurments. Reviewed with patient any recent changes in health history, infections, medications, or general concerns as it relates to lymphedema. Patient reports swelling to her right arm and hand that developed during adjuvant chemotherapy. She noticed swelling gradually increased through her last cycle of TC. Since completion of chemotherapy the swelling has remained constant and unchanged. Reinforced and verified adherence to precautions from initial visit. Patient has a compression sleeve she wears daily from approximately 7:00 am- 4:00 pm. She doesn't own a compression hand piece. She denies increase in hand swelling with compression sleeve use. Reviewed early signs and symptoms of lymphedema. Patient denies any feelings of achiness, heaviness, or fullness to her RUE. Discussed current activity level. She has returned to previous activity level. She works as a Engineer, site.       Bilateral circumferential arm measurements obtained for clinical trial.     Right:     Halfway point  Distance above antecubital fossa- 23.0 cm  Distance below antecubital fossa- 27.5 cm     Circumference  Axilla- 46.4 cm  Halfway from antecubital fossa to axilla- 40.6 cm  Antecubital fossa (just above elbow joint)- 36.5 cm  Halfway from antecubital fossa to wrist-  32.6 cm  Wrist- 19.1 cm     Left:     Halfway point  Distance above antecubital fossa- 23.0 cm  Distance below antecubital fossa-  27.5 cm     Circumference  Axilla- 44.6 cm  Halfway from antecubital fossa to axilla- 39.4 cm  Antecubital fossa (just above elbow joint)- 33.8 cm  Halfway from antecubital fossa to wrist- 30.5 cm  Wrist- 18.6 cm    Assessment:      Circumferential Measurements  Bioimpedance Analysis   RUE/LUE  Unilateral: 16.2   Hand:  22.3 / 20.6     Wrist:  20.0 / 18.5       8 cm:  26.4 / 25.4     16 cm:  33.5 / 31.3     Elbow cm:  36.5 / 33.2     8 cm:  40.0 / 37.8     16 cm:  43.2 / 40.1     24 cm:  45.7 / 43.2     Handedness:  right handed  Notes:    Baseline: 2.4         BIS: Elevated, greater than 3 standard deviation increase from baseline.  Patient denies feelings of achiness, heaviness, and fullness.  Bilateral upper extremity skin is pink, soft and intact. No rash, erythema or ulcerations noted.   Edema present to 2nd and 3rd metacarpals of right hand  Fullness noted to RUE  No pitting, stemmer's, or fibrosis noted.    Slightly decreased visualization of bony prominences and vasculature on right and when compared to left.  ROM: able to fully  extend bilateral upper extremities above head without hesitation or guarding.  Strength: Normal and equal bilaterally.    Discussed assessment with patient. Reviewed elevated BIS results and circumferential arm measurements noting areas of significant ( >2 cm) variance between arms. Discussed possible causes of swelling as lymphedema vs. Taxane induced. Explained Taxane induced swelling usually doesn't respond to use of compression garments like lymphedema swelling does. Discussed recommendation for initiation of early lymphedema treatment interventions, given she is s/p ALND with arm measurment changes and elevated BIS results. Early lymphedema signs and symptoms, potential aggravating factors, lymphatic drainage pathways, and treatment interventions discussed. Discussed and demonstrated right side crawling the wall stretching exercise. Discussed and demonstrated gentle self-massage (MLD) using stretch and pull (stationary circles) technique. Discussed purpose and importance of compression with appropriate size compression sleeve. Discussed need for compression gauntlet with presence of swelling to dorsum of hand. Juzo CCL1 size Large compression gauntlet provided from stock. Compression sleeve instructions reviewed. Verified patient is aware of worsening signs and symptoms to watch for as well as when to contact the clinic. Written handout of interventions discussed provided.    Lymphedema Stage:  Stage 1- RUE vs. Taxane induced    Recommendations:  Wear compression sleeve and gauntlet daily while awake. Self-massage and right side crawling the wall stretching exercise twice per day, morning and night. Continue with initial ALND risk-reduction precautions, meticulous skin care, weight management as well as active healthy lifestyle. Verified no referrals indicated at this time for any issues or concerns.    Plan:  Return to clinic in 4 weeks. Patient agreeable with recommendations and plan. Follow-up appointment scheduled at time of today's appointment. Verified patient has contact information should any questions or concerns arise.

## 2020-01-09 ENCOUNTER — Encounter: Admit: 2020-01-09 | Discharge: 2020-01-09 | Payer: BC Managed Care – PPO

## 2020-01-09 ENCOUNTER — Ambulatory Visit: Admit: 2020-01-09 | Discharge: 2020-01-09 | Payer: BC Managed Care – PPO

## 2020-01-09 DIAGNOSIS — E282 Polycystic ovarian syndrome: Secondary | ICD-10-CM

## 2020-01-09 DIAGNOSIS — J302 Other seasonal allergic rhinitis: Secondary | ICD-10-CM

## 2020-01-09 DIAGNOSIS — C50911 Malignant neoplasm of unspecified site of right female breast: Secondary | ICD-10-CM

## 2020-01-09 DIAGNOSIS — E8881 Metabolic syndrome: Secondary | ICD-10-CM

## 2020-01-09 DIAGNOSIS — C50919 Malignant neoplasm of unspecified site of unspecified female breast: Secondary | ICD-10-CM

## 2020-01-09 DIAGNOSIS — I10 Essential (primary) hypertension: Secondary | ICD-10-CM

## 2020-01-09 DIAGNOSIS — Z006 Encounter for examination for normal comparison and control in clinical research program: Secondary | ICD-10-CM

## 2020-01-09 DIAGNOSIS — C50411 Malignant neoplasm of upper-outer quadrant of right female breast: Secondary | ICD-10-CM

## 2020-01-09 LAB — BETA-HCG: Lab: 3 U/L (ref <5)

## 2020-01-09 NOTE — Progress Notes
Radiation Oncology Follow Up Note  Date: 01/09/2020       Jennifer Durham is a 44 y.o. female.     The encounter diagnosis was Malignant neoplasm of upper-outer quadrant of right breast in female, estrogen receptor positive (HCC).  Staging: Cancer Staging  Malignant neoplasm of upper-outer quadrant of right breast in female, estrogen receptor positive (HCC)  Staging form: Breast, AJCC 8th Edition  - Clinical stage from 07/04/2019: Stage IA (cT1c, cN0(f), cM0, G2, ER+, PR+, HER2-) - Signed by Guy Begin, PA-C on 07/12/2019  - Pathologic stage from 08/21/2019: Stage IA (pT1c, pN1a, cM0, G2, ER+, PR+, HER2-) - Signed by Guy Begin, PA-C on 08/30/2019        History of Present Illness  Jennifer Durham is a 44 y.o. female with Stage IA (cT1c, cN0(f), cM0, G2, ER+, PR+, HER2-).  Stage IA (pT1c, pN1a, cM0, G2, ER+, PR+, HER2-)  I personally reviewed her previous medical records during imaging, pathology, and other provider records, to obtain some of this information. Her oncologic history is briefly as follows:  ?  The patient is a 44 year old female with family history of breast cancer in her mother and maternal aunts.  She reports that she first noticed some dimpling/changes in the right breast in May 2021.  She had already been scheduled to have a screening mammogram at that time and it was converted to a bilateral diagnostic mammogram.the diagnostic mammogram was performed in [place], [place], on July 04, 2019.  It identified a spiculated mass with architectural distortion measuring up to 2 cm in the upper medial right breast, 10-11 o'clock, 7.7 cm posterior to the nipple there were also noted to be some slightly prominent asymmetric right axillary lymph nodes.  On same-day ultrasound, the tumor was measured at 1.3 x 1.7 x 1.6 cm.  There was increased cortical mantle thickening of the right axillary lymph node measuring up to 4 mm.  BI-RADS 5.  A right breast and right axillary biopsy performed in [place] on July 04, 2019 carcinoma.  Outside path review was performed at [place]  which confirmed grade 2 IDC with a focal lobular growth pattern and lymphoid tissue with no evidence of carcinoma in the right axillary specimen.  She was seen by Dr. Robina Ade, and Dr. Raul Del, in the [place] breast clinic on July 16, 2019 for evaluation.  New imaging was performed the same day at [facility].  It revealed a small mass in the probable 9 o'clock position right breast posterior depth, 2 cm inferior and 2 cm posterior to the known malignancy.  In the 930 to 10 o'clock position of the right breast middle to posterior depth a 1.7 x 1.9 cm spiculated mass with internal tissue marker clip corresponding to biopsy-proven malignancy.  In the 830 to 9 o'clock position of the right breast at posterior depth there was a 0.8 cm lobular equal density mass which is not definitively seen on the 2D mammogram images from fibber 25th 2015.  Targeted ultrasound at [facility] on July 16, 2019 showed a 1.4 x 1.8 x 1.6 cm irregular hypoechoic mass at the 930 location 9 cm from the nipple with internal tissue marker clip.  It also noted in the right breast 9:00, 12 cm from the nipple, a 0.8 cm morphologically normal small intramammary lymph node corresponding to the additional mammographic mass identified.  No suspicious right axillary lymph nodes were seen.  She was recommended for upfront surgery.  MRI of the bilateral breasts  and genetic testing were ordered.  ?July 16, 2019 [facility] my[facility] genetic testing did not identify a clinically significant mutation.  ?07/20/2019 MRI bilateral breasts confirmed the presence of an irregular spiculated mass in the right upper outer breast compatible with previous biopsy of malignancy and no enlarged or morphologically suspicious lymph nodes.  ?On August 21, 2019, the patient underwent bilateral skin sparing mastectomy and right sentinel lymph node biopsy, axilla lymph node dissection, and reconstruction. 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  A 5 mm focus of metastatic carcinoma was found in 1 of 4 sentinel lymph nodes.  0 of 27 additional axillary nodes were involved. pT1c N1a M0 (1 of 34 LN+) M0. No LVI, No EIC, No ENE.  ?  She has been seen and evaluated by Dr. Park Breed and has been recommended to receive adjuvant chemotherapy.  She is currently scheduled for cycle 1 of 4 of TC chemotherapy on September 26, 2019.  Subsequent cycles scheduled for October 17, 2019, November 07, 2019, and fourth cycle on November 28, 2019.     She was initially seen in consultation on September 13, 2019, for discussion of the role for postmastectomy radiation therapy.       Review of Systems   Constitutional: Negative.    HENT: Negative.    Eyes: Negative.    Respiratory: Negative.  Negative for shortness of breath.    Cardiovascular: Negative.  Negative for chest pain.   Gastrointestinal: Negative.  Negative for nausea and vomiting.   Endocrine: Negative.    Genitourinary: Negative.    Musculoskeletal: Negative.    Skin: Negative.    Allergic/Immunologic: Negative.    Neurological: Negative.  Negative for headaches.   Hematological: Negative.    Psychiatric/Behavioral: Negative.    All other systems reviewed and are negative.        Subjective:       Jennifer Durham is a never smoker, premenopausal female who returns today for follow-up in anticipation of being randomized into the MA. 39 clinical trial of observation versus postmastectomy radiation therapy.  On evaluation today, she reports that she is doing well.  No new complaints or concerns to discuss.            Objective:         ? acetaminophen (TYLENOL EXTRA STRENGTH) 500 mg tablet Take 1,000 mg by mouth every 6 hours as needed for Pain. Max of 4,000 mg of acetaminophen in 24 hours.   ? levonorgestrel (MIRENA) 20 mcg/24 hr (5 years) intrauterine device   1 EA, X 1 DOSE, Intrauteral, 1 EA, 0 Number of Refills, DEV   ? metFORMIN (GLUCOPHAGE) 1,000 mg tablet Take 1,000 mg by mouth twice daily with meals.   ? montelukast (SINGULAIR) 10 mg tablet Take 10 mg by mouth at bedtime daily.   ? tamoxifen (NOLVADEX) 20 mg tablet Take one tablet by mouth daily.   ? triamterene-hydrochlorothiazide (MAXZIDE) 75-50 mg tablet Take 1 tablet by mouth daily.     Vitals:    01/09/20 1542   BP: 122/76   Pulse: 88   Resp: 18   Temp: 36.3 ?C (97.4 ?F)   SpO2: 98%   Weight: (!) 142.1 kg (313 lb 3.2 oz)     Body mass index is 42.47 kg/m?Marland Kitchen        Pain Loc:  (back spasm)     Fatigue Scale: 4    The patient's height: 182.9  cm (72.01 inches on 12/31/2019)    KARNOFSKY PERFORMANCE SCORE:  100% Normal, no complaints     Physical Exam  Vitals and nursing note reviewed.   Constitutional:       General: She is not in acute distress.     Appearance: Normal appearance. She is well-developed. She is obese. She is not ill-appearing.      Comments: Alopecia, she is wearing awake   HENT:      Head: Normocephalic and atraumatic.   Eyes:      Extraocular Movements: Extraocular movements intact.      Pupils: Pupils are equal, round, and reactive to light.   Pulmonary:      Effort: Pulmonary effort is normal.   Chest:      Breasts:         Right: Normal.         Left: Normal.   Musculoskeletal:      Cervical back: Normal range of motion.   Skin:     General: Skin is warm and dry.   Neurological:      General: No focal deficit present.      Mental Status: She is alert and oriented to person, place, and time.   Psychiatric:         Behavior: Behavior is cooperative.         Thought Content: Thought content normal.              Laboratory:    Comprehensive Metabolic Profile    Lab Results   Component Value Date/Time    NA 139 11/28/2019 08:10 AM    K 3.7 11/28/2019 08:10 AM    CL 104 11/28/2019 08:10 AM    CO2 23 11/28/2019 08:10 AM    GAP 12 11/28/2019 08:10 AM    BUN 15 11/28/2019 08:10 AM    CR 1.05 (H) 11/28/2019 08:10 AM    GLU 174 (H) 11/28/2019 08:10 AM    Lab Results   Component Value Date/Time    CA 9.2 11/28/2019 08:10 AM    ALBUMIN 4.2 11/28/2019 08:10 AM    TOTPROT 6.9 11/28/2019 08:10 AM    ALKPHOS 32 11/28/2019 08:10 AM    AST 14 11/28/2019 08:10 AM    ALT 14 11/28/2019 08:10 AM    TOTBILI 0.4 11/28/2019 08:10 AM    GFR 57 (L) 11/28/2019 08:10 AM    GFRAA >60 11/28/2019 08:10 AM        CBC w diff    Lab Results   Component Value Date/Time    WBC 17.7 (H) 11/28/2019 08:10 AM    RBC 3.25 (L) 11/28/2019 08:10 AM    HGB 9.5 (L) 11/28/2019 08:10 AM    HCT 28.8 (L) 11/28/2019 08:10 AM    MCV 88.8 11/28/2019 08:10 AM    MCH 29.4 11/28/2019 08:10 AM    MCHC 33.1 11/28/2019 08:10 AM    RDW 18.3 (H) 11/28/2019 08:10 AM    PLTCT 373 11/28/2019 08:10 AM    MPV 7.3 11/28/2019 08:10 AM    Lab Results   Component Value Date/Time    NEUT 90 (H) 11/28/2019 08:10 AM    ANC 16.10 (H) 11/28/2019 08:10 AM    LYMA 5 (L) 11/28/2019 08:10 AM    ALC 0.80 (L) 11/28/2019 08:10 AM    MONA 5 11/28/2019 08:10 AM    AMC 0.80 11/28/2019 08:10 AM    EOSA 0 11/28/2019 08:10 AM    AEC 0.00 11/28/2019 08:10 AM  BASA 0 11/28/2019 08:10 AM    ABC 0.00 11/28/2019 08:10 AM            Imaging:   MAMMO LYMPH NODE INJ RADIOTRACER RT (OR)  This order has been auto finalized and does not contain a result.  BREAST SPEC PATH ONLY/NO RAD READ  This order has been auto finalized and does not contain a result.  BREAST SPEC PATH ONLY/NO RAD READ  This order has been auto finalized and does not contain a result.       Path:  PATHOLOGY REPORT   Date Value Ref Range Status   08/21/2019   Final    [facility]  [email or web address]    Department of Pathology and Laboratory Medicine  [address] [place], [place] [address]  Surgical Pathology Office:  [phone]  Fax:  [phone]  SURGICAL PATHOLOGY REPORT    NAME: NITOSHA, SCHMITZ PATH #: [id number] MR #: [id number] SPECIMEN  CLASS: SI BILLING #: [id number] ALT ID #:  LOCATION: [facility]3 DATE OF  PROCEDURE: 08/21/2019 AGE:  43 SEX: F DATE RECEIVED: 08/21/2019 DOB:  December 24, 1975  TIME RECEIVED:  11:32 PHYSICIAN: LYNDSEY J KILGORE DATE OF  REPORT: 08/24/2019 COPY TO: ERIC C LAI, MD DATE OF PRINTING: 08/24/2019   Procedures/Addenda  Addendum     Date Ordered:     08/24/2019     Status:  Signed Out  Date Complete:     08/24/2019  By: Sol Blazing, MD  Date Reported:     08/24/2019             Addendum Comment  Breast Cancer Prognostic Panel by Immunohistochemistry (IHC) using  Quantitative Computer-Assisted Image Analysis    Testing performed on block Number: A13  Estrogen Receptor (ER):          96%     Positive     Stain Intensity:  Strong  Progesterone Receptor (PR):     99%     Positive     Stain Intensity:  Strong  Her2:                    0     Negative    The original image analysis report is scanned in Epic under Results  Review.    Disclaimer: The [facility] is certified  under the Clinical Laboratory Improvement Amendments of 1988 (CLIA) to  perform high complexity clinical laboratory testing. The Food and Drug  Administration (FDA) cleared estrogen receptor/progesterone receptor  (ER/PR) assays and the FDA approved Her2 assay are performed on  formalin-fixed, paraffin-embedded tissue sections using the Ventana  Benchmark Ultraview Universal DAB detection kit with Ventana Confirm ER  antibody (clone [id number]), Ventana Confirm PR antibody (clone [id number]), and Ventana  Pathway Her2/neu antibody (clone [id number]). Ki-67 is performed using Dako Flex  Ki-67 antibody (clone MIB-1) with Dako Flex Dab detection kit on  autostainer Link 48. Interpretations of the ER/PR and Her2 results follow  the most current American Society of Clinical Oncology/College of American  Pathologists (ASCO/CAP) guidelines. Estrogen receptor and progesterone  receptor studies are reported as a percentage of positive tumor nuclei  that express immunoreactivity and intensity of staining. For breast  cancer, estrogen receptor and progesterone receptor positivity 1% or  greater is considered positive. HER-2/neu that demonstrates incomplete  membrane staining that is faint/barely perceptible and within d10% of  tumor cells is scored as 0 and within >10% of tumor cells is scored  as 1+.  A score of 0 or 1+ is considered negative. Weak to moderate complete  membrane staining in >10% of tumor cells or complete membrane staining  that is intense but within d10% of tumor cells is scored as 2+ equivocal  and will be reflexed to FISH. A score of 3+ requires strong and complete  membrane staining in greater than 10% of tumor cells and is considered  positive. Known positive and negative control tissues show appropriate  staining. Cold ischemic time and formalin fixation time of the tissue meet  the ASCO/CAP requirements unless noted in the pathology report. This assay  has not been validated on decalcified tissues. The image analysis is  performed using Ventana Virtuoso Image Analysis system (v5.6.2). ER  Negative, Low Positive, and Weak staining results are adjudicated  according to the steps in the Standard Operating Procedure of the [facility].                                                         ########################################################################  Final Diagnosis:    A. Breast, right breast skin sparing mastectomy, short stitch superior,  long stitch lateral, mastectomy:  Invasive ductal carcinoma, histologic grade 2, see comment.  Ductal carcinoma, nuclear grade 2, solid and cribriform patterns.  Fibroadenoma.       B. Lymph nodes, right axillary sentinel lymph node #1, biopsy:    There is no evidence of malignancy in two lymph nodes.  (0/2)  Deeper sections and a pancytokeratin immunostain are negative in  support of the above diagnosis.      C. Lymph node, right axillary sentinel lymph node #2, biopsy:    There is no evidence of malignancy in one lymph node.  (0/1)  Deeper sections and a pancytokeratin immunostain are negative in  support of the above diagnosis.      D. Lymph node, right axillary sentinel lymph node #3, biopsy:    Metastatic carcinoma in one lymph node (1/1), 5 mm in greatest  dimension.  Deeper sections and a pancytokeratin immunostain are positive in  support of the above diagnosis.      E. Lymph node, right axillary sentinel lymph node #4:    There is no evidence of malignancy in one lymph node.  (0/1)  Deeper sections and a pancytokeratin immunostain are negative in  support of the above diagnosis.      F. Lymph node, right axillary sentinel lymph node #5, biopsy:    There is no evidence of malignancy in one lymph node.  (0/1)  Deeper sections and a pancytokeratin immunostain are negative in  support of the above diagnosis.      G. Fibroadipose tissue, right additional anterior lateral margin clip  marks new true margin, excision:    Unremarkable fibroadipose tissue.    H. Lymph node, right low axillary lymph node, excision:    There is no evidence of malignancy in one lymph node.  (0/1)    I. Breast, left breast skin sparing mastectomy, short stitch superior,  long stitch lateral, mastectomy:    Fibrocystic changes.  There is no evidence of malignancy.      J. Lymph nodes, right axillary contents level I and II, axillary  dissection:    Negative for malignancy in twenty-seven lymph nodes (0/27).  Biopsy site present  in one lymph node.              K. Skin, right mastectomy skin, excision:  Dermal fibrosis.    Comment:  INVASIVE CARCINOMA OF THE BREAST: RESECTION  CAP Version: Invasive Breast 4.4.0.0    Procedure: Skin sparing mastectomy   Laterality: Right  Tumor Site: 9:30, 8 cm FTN  Histologic Type: Invasive ductal carcinoma  Size of Invasive Component: 1.8 x 1.7 x 1.2 cm     Surgical Margins:  Mastectomy  For invasive carcinoma: All margins are greater than 2 cm  For DCIS: All margins are greater than 2 cm              Histologic Grade (Nottingham Histologic Score): II/III  Tubule Formation: 3  Nuclear Grade: 3  Mitotic Count (40x objective): 1  Total Nottingham Score: 7/9  Ductal Carcinoma In-situ (DCIS): Present  Extensive intraductal component (>25%): Absent  DCIS within and/or adjacent to invasive carcinoma: Yes   DCIS separate from invasive carcinoma: No    Lobular Carcinoma In-situ (LCIS): Absent  Lymph-Vascular Invasion: Not identified  Nipple Involvement: Absent   Skin Involvement: Absent   Skeletal Muscle: Not involved   Lymph Node Sampling:  Sentinel lymph node with axillary dissection  Total number of lymph nodes examined: 34  Number of lymph nodes with macrometastases (>2 mm): 1  Number of lymph nodes with micrometastases (>0.2 mm to 2 mm): 0  Number of lymph nodes with isolated tumor cells (<0.2 mm and  <200 cells): 0  Size of largest metastatic deposit (millimeters): 5 mm  Extranodal extension: Not identified  Treatment Effect in the Breast: No known presurgical therapy  Prognostic markers: The results will be issued as an addendum.  Time between tumor removal and placement into formalin < 1 hour: Yes  Fixation Time between 6-72 hours: Yes    Pathologic Staging (pTNM, AJCC 8th edition):  pT1c N1a    Primary Tumor (Invasive Carcinoma) (pT)   pT1c:     Tumor >10 mm but d20 mm in greatest dimension    Regional Lymph Nodes (pN)  pN1a:     Metastases in 1 to 3 axillary lymph nodes, at least 1 metastasis  larger than 2.0 mm    Distant Metastasis (M)    Not applicable    The pathologic stage assigned here should be regarded as provisional, as  it reflects only current pathologic data and does not incorporate full  knowledge of the patient's clinical status and/or prior pathology.    Block for Biomarker Testing: A13       Attestation:  By this signature, I attest that I have personally formu...   ]         Assessment and Plan:    44 y.o. 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 pT1c pN1a.  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  She is status post bilateral skin sparing mastectomy with temporary expander placement, right sentinel lymph node biopsy, axillary lymph node dissection and 1 of 34 lymph nodes positive for a 5 mm metastatic deposit, no LVI, no ENE, no EIC.  Adjuvant TC chemotherapy x4 cycles is scheduled to be initiated on 09/26/2019.  Fourth cycle completed 11/28/2019.  She is a candidate for adjuvant postmastectomy radiation therapy to improve local regional control and breast cancer specific mortality.      Ms. Elem returns today for follow-up.  She is in the process  of being randomized as part of the MA 39 clinical trial.    MA.39 (OZD#[id number]) Tailor RT: A randomized trial of regional radiotherapy and biomarker low risk node positive breast cancer.  To compare the breast cancer recurrence-free interval (BCRFI) between patients that received regional RT or not, defined as time from randomization to time of invasive recurrent disease in the ipsilateral chest wall, breast, regional nodes, distant sites or death due to breast cancer.    ?Mobility measurements were taken today with a goniometer and provided to the research coordinator.    ?Pregnancy test also performed today and results are pending.    ?No new adverse items identified at this time.  She is wearing a lymphedema sleeve on the right upper extremity.    ?She is completed the required questionnaires and provided those to the research coordinator.    ?This information will be entered in tomorrow and she will be randomized.  We will contact her once she has been randomized to notify her of which arm she will be participating in.        I am grateful for the opportunity to participate in the care of Ms. Soto.    Please do not hesitate contact me if there are questions regarding today's visit.    Elby Showers, MD                        I spent a total of 40 minutes today in direct patient evaluation with more than 50% spent in treatment plan formulation, counseling, symptom management discussion and consultation as outlined above.     Parts of this note were created using voice recognition software.  Please excuse any grammatical or typographical errors.

## 2020-01-10 ENCOUNTER — Encounter: Admit: 2020-01-10 | Discharge: 2020-01-10 | Payer: BC Managed Care – PPO

## 2020-01-10 ENCOUNTER — Ambulatory Visit: Admit: 2020-01-10 | Discharge: 2020-01-11 | Payer: BC Managed Care – PPO

## 2020-01-10 DIAGNOSIS — J302 Other seasonal allergic rhinitis: Secondary | ICD-10-CM

## 2020-01-10 DIAGNOSIS — Z30433 Encounter for removal and reinsertion of intrauterine contraceptive device: Principal | ICD-10-CM

## 2020-01-10 DIAGNOSIS — E282 Polycystic ovarian syndrome: Secondary | ICD-10-CM

## 2020-01-10 DIAGNOSIS — E8881 Metabolic syndrome: Secondary | ICD-10-CM

## 2020-01-10 DIAGNOSIS — C50919 Malignant neoplasm of unspecified site of unspecified female breast: Secondary | ICD-10-CM

## 2020-01-10 DIAGNOSIS — I10 Essential (primary) hypertension: Secondary | ICD-10-CM

## 2020-01-10 MED ORDER — COPPER 380 SQUARE MM IU IUD
1 | Freq: Once | INTRAUTERINE | 0 refills | Status: CP
Start: 2020-01-10 — End: ?

## 2020-01-10 MED ORDER — LIDOCAINE HCL 10 MG/ML (1 %) IJ SOLN
20 mL | Freq: Once | INTRAMUSCULAR | 0 refills | Status: CP
Start: 2020-01-10 — End: ?

## 2020-01-10 NOTE — Research Notes
Study Title:Collect Information to Understand Cancer Health Disparities and Clinical Trial Accrual  HSC: [id number]  Consent Version Date: 07/18/2018    Clinical trial participation and research nature of the trial were discussed with the participant during this visit. Participant was alert and oriented during consent discussion. Participant was informed that clinical trial is voluntary.  Study purpose, procedures, tests, samples to be obtained, potential side effects, benefits, foreseeable risks and duration of study were discussed. HIPAA information, compensation and insurance pre-certification were discussed per consent form.    Participant was given time to review the consent form and to discuss participation in this study. Questions asked were answered to her satisfaction and participant voiced desire to sign consent today. Participant signed consent without coercion and undue influence. A copy of the signed consent was given to the participant and emailed to [employer] ([employer]) for scanning into the participant's medical record in [place]

## 2020-01-11 DIAGNOSIS — Z124 Encounter for screening for malignant neoplasm of cervix: Secondary | ICD-10-CM

## 2020-01-14 ENCOUNTER — Encounter: Admit: 2020-01-14 | Discharge: 2020-01-14 | Payer: BC Managed Care – PPO

## 2020-01-14 MED ORDER — ESCITALOPRAM OXALATE 10 MG PO TAB
10 mg | ORAL_TABLET | Freq: Every day | ORAL | 1 refills | Status: AC
Start: 2020-01-14 — End: ?

## 2020-01-28 ENCOUNTER — Encounter: Admit: 2020-01-28 | Discharge: 2020-01-28 | Payer: BC Managed Care – PPO

## 2020-01-28 DIAGNOSIS — Z9189 Other specified personal risk factors, not elsewhere classified: Secondary | ICD-10-CM

## 2020-01-30 ENCOUNTER — Encounter: Admit: 2020-01-30 | Discharge: 2020-01-30 | Payer: BC Managed Care – PPO

## 2020-01-30 DIAGNOSIS — Z006 Encounter for examination for normal comparison and control in clinical research program: Secondary | ICD-10-CM

## 2020-01-30 DIAGNOSIS — C50911 Malignant neoplasm of unspecified site of right female breast: Secondary | ICD-10-CM

## 2020-01-30 LAB — PREGNANCY TEST-URINE
Lab: 1
Lab: NEGATIVE

## 2020-02-06 ENCOUNTER — Encounter: Admit: 2020-02-06 | Discharge: 2020-02-06 | Payer: BC Managed Care – PPO

## 2020-02-07 ENCOUNTER — Encounter: Admit: 2020-02-07 | Discharge: 2020-02-07 | Payer: BC Managed Care – PPO

## 2020-02-07 DIAGNOSIS — Z006 Encounter for examination for normal comparison and control in clinical research program: Secondary | ICD-10-CM

## 2020-02-07 DIAGNOSIS — C50911 Malignant neoplasm of unspecified site of right female breast: Secondary | ICD-10-CM

## 2020-02-07 DIAGNOSIS — C50411 Malignant neoplasm of upper-outer quadrant of right female breast: Secondary | ICD-10-CM

## 2020-02-07 NOTE — Telephone Encounter
Treatment Planning Note    Encounter Date: 02/07/2020 10:08 am     Jennifer Durham is a 45 y.o. female patient that presents with Malignant neoplasm of upper-outer quadrant of right breast in female, estrogen receptor positive ([place]) [C50.411, Z17.0].      The patient will be treated with:     TX Technique: 3D Radiation Therapy     Laterality: Right      Specific Area Treatment Site: Reconstructed CW and regional lymph nodes    Number of Treatments: 4256 cGy in 16 fractions      Medical Imaging will be utilized to help ensure accuracy of positioning and field configuration.     I have personally formulated the patients clinical treatment plan.  A finalized treatment plan will be formulated, approved, and available in the patient's chart post simulation and pretreatment.     Special Treatment Required: No    Concurrent Chemo: No    There are no questions and answers to display.       This document is electronically approved by Noland Fordyce, MD.

## 2020-02-07 NOTE — Telephone Encounter
Telephone encounter    Returned phone call from Ms. Mcnall.  Was able to reach her immediately.  Explained that I had presented her case this morning at the multidisciplinary breast tumor board for [place].  The consensus of the group is that the current clinical standard of care is to strongly consider comprehensive radiation therapy in the postmastectomy setting.  MA.39 clinical trial is the setting in which observation can be offered hence her enrollment in the trial.  I expressed regret for the confusion regarding our communication to her in December, that she had been randomized to observation and subsequently learning that the randomization had not actually been completed.  She acknowledged understanding and was very gracious.  She has actually been randomized to the treatment arm.  I do not feel comfortable telling her that it is safe for her to omit radiation therapy off clinical trial.  The patient states that she understands and is willing to proceed with a clinical trial.  I did express that MA 39 does allow for hypofractionation and I believe that this would be an excellent option for her.  Would allow me to complete her treatment in 3 weeks and I think that she would be an appropriate person to consider omitting the boost to minimize potential complications without compromising her cancer control.  Patient knowledge understanding and states she is willing to proceed with the clinical trial.  We will need to get her simulated as soon as possible so that we can initiate her treatment before February 20, 2020.  The patient request that we try to coordinate her blood draw on the same date to minimize transportation issues for her from [place], [place].  I will work with my team to try to accommodate this request.    All of her questions were answered to her satisfaction.    I am grateful for the opportunity to participate in the care of Ms. Valladares.    Please do not hesitate contact me if there are questions regarding today's visit.    Elby Showers, MD

## 2020-02-10 ENCOUNTER — Ambulatory Visit: Admit: 2020-02-10 | Discharge: 2020-02-12 | Payer: BC Managed Care – PPO

## 2020-02-12 ENCOUNTER — Encounter: Admit: 2020-02-12 | Discharge: 2020-02-12 | Payer: BC Managed Care – PPO

## 2020-02-12 DIAGNOSIS — Z006 Encounter for examination for normal comparison and control in clinical research program: Secondary | ICD-10-CM

## 2020-02-12 DIAGNOSIS — C50411 Malignant neoplasm of upper-outer quadrant of right female breast: Secondary | ICD-10-CM

## 2020-02-12 DIAGNOSIS — C50911 Malignant neoplasm of unspecified site of right female breast: Secondary | ICD-10-CM

## 2020-02-12 LAB — PREGNANCY TEST-URINE: Lab: NEGATIVE % (ref 11–15)

## 2020-02-12 LAB — RESEARCH BLOOD COLLECTION ONLY

## 2020-02-12 NOTE — Progress Notes
Simulation/Procedure Note:      Type of radiation therapy being planned: 3D EBRT    Anatomic site: Right chest wall/nodes    The chest region was scanned in order to establish treatment fields for radiation therapy and is medically necessary to establish the treatment fields to this area.    During the simulation process the patient was setup in the supine position and a customized wing board immobilization device was used.     Axial CT images were acquired through the region of interest in order to establish the appropriate isocenter.  These images will be transferred to the treatment planning system in order to identify the dose volumes for the targeted treatment area and for delineation of the normal critical structures, including the lung, heart which are in close proximity of the targeted area of treatment.          This document is electronically approved by  , MD.

## 2020-02-12 NOTE — Research Notes
Study Title:Collect Information to Understand Cancer Health Disparities and Clinical Trial Accrual  HSC: [phone]  Treatment ARM: 2 Regional RT    Participant had pr- treatment blood draw (2 X 10 ml Streck tubes) for correlative labs drawn today at [facility]. Tubes were drawn and shipped to [place] by [facility] express priority. Documentation was saved to the patient's chart.  Participant is due to start [place] on 02/19/19  C. Wilber Oliphant  [phone]

## 2020-02-19 ENCOUNTER — Ambulatory Visit: Admit: 2020-02-19 | Discharge: 2020-02-19 | Payer: BC Managed Care – PPO

## 2020-02-19 ENCOUNTER — Encounter: Admit: 2020-02-19 | Discharge: 2020-02-19 | Payer: BC Managed Care – PPO

## 2020-02-19 DIAGNOSIS — C50411 Malignant neoplasm of upper-outer quadrant of right female breast: Secondary | ICD-10-CM

## 2020-02-19 LAB — PREGNANCY TEST-URINE
Lab: 1
Lab: NEGATIVE

## 2020-02-20 ENCOUNTER — Encounter: Admit: 2020-02-20 | Discharge: 2020-02-20 | Payer: BC Managed Care – PPO

## 2020-02-21 ENCOUNTER — Encounter: Admit: 2020-02-21 | Discharge: 2020-02-21 | Payer: BC Managed Care – PPO

## 2020-02-22 ENCOUNTER — Encounter: Admit: 2020-02-22 | Discharge: 2020-02-22 | Payer: BC Managed Care – PPO

## 2020-02-25 ENCOUNTER — Encounter: Admit: 2020-02-25 | Discharge: 2020-02-25 | Payer: BC Managed Care – PPO

## 2020-02-25 ENCOUNTER — Ambulatory Visit: Admit: 2020-02-25 | Discharge: 2020-02-25 | Payer: BC Managed Care – PPO

## 2020-02-25 DIAGNOSIS — C50919 Malignant neoplasm of unspecified site of unspecified female breast: Secondary | ICD-10-CM

## 2020-02-25 DIAGNOSIS — E282 Polycystic ovarian syndrome: Secondary | ICD-10-CM

## 2020-02-25 DIAGNOSIS — E8881 Metabolic syndrome: Secondary | ICD-10-CM

## 2020-02-25 DIAGNOSIS — J302 Other seasonal allergic rhinitis: Secondary | ICD-10-CM

## 2020-02-25 DIAGNOSIS — I10 Essential (primary) hypertension: Secondary | ICD-10-CM

## 2020-02-25 DIAGNOSIS — C50411 Malignant neoplasm of upper-outer quadrant of right female breast: Secondary | ICD-10-CM

## 2020-02-26 ENCOUNTER — Ambulatory Visit: Admit: 2020-02-26 | Discharge: 2020-03-03 | Payer: BC Managed Care – PPO

## 2020-02-26 ENCOUNTER — Encounter: Admit: 2020-02-26 | Discharge: 2020-02-26 | Payer: BC Managed Care – PPO

## 2020-02-26 ENCOUNTER — Ambulatory Visit: Admit: 2020-02-26 | Discharge: 2020-02-26 | Payer: BC Managed Care – PPO

## 2020-02-26 DIAGNOSIS — C50919 Malignant neoplasm of unspecified site of unspecified female breast: Secondary | ICD-10-CM

## 2020-02-26 DIAGNOSIS — I10 Essential (primary) hypertension: Secondary | ICD-10-CM

## 2020-02-26 DIAGNOSIS — E8881 Metabolic syndrome: Secondary | ICD-10-CM

## 2020-02-26 DIAGNOSIS — E282 Polycystic ovarian syndrome: Secondary | ICD-10-CM

## 2020-02-26 DIAGNOSIS — J302 Other seasonal allergic rhinitis: Secondary | ICD-10-CM

## 2020-02-27 ENCOUNTER — Encounter: Admit: 2020-02-27 | Discharge: 2020-02-27 | Payer: BC Managed Care – PPO

## 2020-02-28 ENCOUNTER — Encounter: Admit: 2020-02-28 | Discharge: 2020-02-28 | Payer: BC Managed Care – PPO

## 2020-02-29 ENCOUNTER — Encounter: Admit: 2020-02-29 | Discharge: 2020-02-29 | Payer: BC Managed Care – PPO

## 2020-02-29 NOTE — Research Notes
Late entry  Study Title:Collect Information to [place] Disparities and Clinical Trial Accrual  HSC: [id number]  Consent Version Date: 07/18/2018    Participant was inadvertently enrolled into sub-study prior to enrollment into main study (CCTG-MA.39) on 01/11/2020.    Participant was registered to only step one on 01/10/2020 to CCTG-MA.39 and not completely enrolled until 01/29/2020.     Jennifer Durham  [phone]

## 2020-03-02 ENCOUNTER — Encounter: Admit: 2020-03-02 | Discharge: 2020-03-02 | Payer: BC Managed Care – PPO

## 2020-03-02 MED ORDER — ESCITALOPRAM OXALATE 10 MG PO TAB
ORAL_TABLET | Freq: Every day | 1 refills
Start: 2020-03-02 — End: ?

## 2020-03-03 ENCOUNTER — Encounter: Admit: 2020-03-03 | Discharge: 2020-03-03 | Payer: BC Managed Care – PPO

## 2020-03-03 DIAGNOSIS — C50919 Malignant neoplasm of unspecified site of unspecified female breast: Secondary | ICD-10-CM

## 2020-03-03 DIAGNOSIS — E282 Polycystic ovarian syndrome: Secondary | ICD-10-CM

## 2020-03-03 DIAGNOSIS — J302 Other seasonal allergic rhinitis: Secondary | ICD-10-CM

## 2020-03-03 DIAGNOSIS — I10 Essential (primary) hypertension: Secondary | ICD-10-CM

## 2020-03-03 DIAGNOSIS — C50411 Malignant neoplasm of upper-outer quadrant of right female breast: Secondary | ICD-10-CM

## 2020-03-03 DIAGNOSIS — E8881 Metabolic syndrome: Secondary | ICD-10-CM

## 2020-03-03 MED ORDER — RXAMB DIPH/LIDO/ANTACID 1:1:1 (COMPOUND)
15 mL | Freq: Before meals | ORAL | 1 refills | 28.00000 days | Status: AC
Start: 2020-03-03 — End: ?

## 2020-03-03 NOTE — Progress Notes
Weekly Management Progress Note    Date: 03/03/2020    Jennifer Durham is a 45 y.o. female.     Vitals:  Vitals:    03/03/20 1601   BP: 133/88   Pulse: 103   Resp: 18   Temp: 36.7 ?C (98 ?F)   SpO2: 100%   Weight: (!) 137 kg (302 lb)   PainSc: Three     Subjective   The encounter diagnosis was Malignant neoplasm of upper-outer quadrant of right breast in female, estrogen receptor positive (HCC).  Staging: Cancer Staging  Malignant neoplasm of upper-outer quadrant of right breast in female, estrogen receptor positive (HCC)  Staging form: Breast, AJCC 8th Edition  - Clinical stage from 07/04/2019: Stage IA (cT1c, cN0(f), cM0, G2, ER+, PR+, HER2-) - Signed by Guy Begin, PA-C on 07/12/2019  - Pathologic stage from 08/21/2019: Stage IA (pT1c, pN1a, cM0, G2, ER+, PR+, HER2-) - Signed by Guy Begin, PA-C on 08/30/2019      Body mass index is 40.95 kg/m?Marland Kitchen  Pain Score: Three  Pain Loc: Throat  Fatigue Scale: 7    Treatment Data Summary:      Treatment Data 02/22/2020 02/25/2020 02/26/2020 02/27/2020 02/28/2020 02/29/2020 03/03/2020   Course ID C1-Rt Breast C1-Rt Breast C1-Rt Breast C1-Rt Breast C1-Rt Breast C1-Rt Breast C1-Rt Breast   Plan ID R CW&nodNObol R CWN2+B R CWN2 R CWN2+B R CWN2 R CWN2+B R CWN2   Prescription Dose (cGy) 2,128 1,596 1,596 1,596 1,596 1,596 1,596   Prescribed Dose per Fraction (Gy) 2.66 2.66 2.66 2.66 2.66 2.66 2.66   Fractions Treated to Date 2 1 1 2 2 3 3    Total Fractions on Plan 8 6 6 6 6 6 6    Treatment Elapsed Days 3 6 7 8 9 10 13    Reference Point ID R recon cw&nodes R recon cw&nodes R recon cw&nodes R recon cw&nodes R recon cw&nodes R recon cw&nodes R recon cw&nodes   Dosage Given to Date (Gy) 10.64 13.3 15.96 18.62 21.28 23.94 26.6        Subjective: Jennifer Durham is seen today for on treatment visit.  She has received 26.6 Gray in 10 treatments to the right reconstructed chest wall and regional nodes. Complains of sore throat when swallowing like razor blades. Sore on the chest wall where her bra strap rubs so doesn't wear a bra when she can.     Objective: Clinical exam is stable.  No acute toxicities of skin.    Imaging: Imaging is reviewed, isocenter alignment verified.     Assessment: Jennifer Durham is a 45 year old female being treated for right breast cancer.    Plan: Continue radiation as planned.  We will add Mepilex barrier dressing to help with the rubbing on her skin.  And Magic mouthwash.    I am grateful for the opportunity to participate in the care of Jennifer Durham.    Please do not hesitate contact me if there are questions regarding today's visit.    Elby Showers, MD

## 2020-03-04 ENCOUNTER — Encounter: Admit: 2020-03-04 | Discharge: 2020-03-04 | Payer: BC Managed Care – PPO

## 2020-03-05 ENCOUNTER — Encounter

## 2020-03-05 DIAGNOSIS — J302 Other seasonal allergic rhinitis: Secondary | ICD-10-CM

## 2020-03-05 DIAGNOSIS — E282 Polycystic ovarian syndrome: Secondary | ICD-10-CM

## 2020-03-05 DIAGNOSIS — E8881 Metabolic syndrome: Secondary | ICD-10-CM

## 2020-03-05 DIAGNOSIS — C50911 Malignant neoplasm of unspecified site of right female breast: Secondary | ICD-10-CM

## 2020-03-05 DIAGNOSIS — C50919 Malignant neoplasm of unspecified site of unspecified female breast: Secondary | ICD-10-CM

## 2020-03-05 DIAGNOSIS — I10 Essential (primary) hypertension: Secondary | ICD-10-CM

## 2020-03-05 NOTE — Progress Notes
Name: Jennifer Durham          MRN: [id number]      DOB: Sep 03, 1975      AGE: 45 y.o.   DATE OF SERVICE: 03/05/2020    Subjective:             Reason for Visit:  Follow Up    Obtained patient's verbal consent to treat them and their agreement to [place] financial policy and NPP via this telehealth visit during the [facility] Emergency      Jennifer Durham is a 45 y.o. female.     Cancer Staging  Malignant neoplasm of upper-outer quadrant of right breast in female, estrogen receptor positive (HCC)  Staging form: Breast, AJCC 8th Edition  - Clinical stage from 07/04/2019: Stage IA (cT1c, cN0(f), cM0, G2, ER+, PR+, HER2-) - Signed by Guy Begin, PA-C on 07/12/2019  - Pathologic stage from 08/21/2019: Stage IA (pT1c, pN1a, cM0, G2, ER+, PR+, HER2-) - Signed by Guy Begin, PA-C on 08/30/2019      History of Present Illness    DIAGNOSIS: right breast cancer   PAST ONCOLOGY HISTORY: Jennifer Durham is a 45 year old pre menopausal female who noted right breast dimpling in May 2021. She was sent for diagnostic imaging at an outside facility on 07/04/19. At that time a new right breast mass was seen as well as a lymph node with cortical thickening and biopsy was recommended. Right breast biopsy 07/04/2019 [name]) showed invasive carcinoma with mixed ductal and lobular features, grade II, ER 91-100%, PR 91-100%, HER2 1+ IHC. Right axilla biopsy showed fragments of polymorphous lymphoid tissue and no malignancy seen. Jennifer Durham underwent right modified radical mastectomy on 08/21/2019.  Tumor size was 1.8 cm, invasive ductal carcinoma.  Grade 2.  1 out of 34 lymph nodes was positive.  The size of LN metastasis was 5 mm.  ER was 96%, PR 99%,, HER-2 0+ by IHC.  Oncotype DX score 11. We recommended adjuvant adjuvant TC every 3 weeks x4 started 09/26/2019. She completed 4 cycles 11/28/2019.   ?  BREAST IMAGING:  Mammogram:    -- Bilateral diagnostic mammogram 07/04/19 [name]) revealed heterogeneously dense breast tissue. Spiculated mass with surrounding architectural distortion measuring up to 2 cm in the upper right medial breast at 10-11:00, 7.7 cm posterior to the nipple. Slightly prominent asymmetric right axillary lymph nodes.  -- Right diagnostic mammogram 07/16/19 ([place]) revealed an the outside right MLO view from 07/04/2019, there was a small mass in the probable 9:00 position of the right breast at posterior depth, 2 cm inferior and 2 cm posterior to the known malignancy. Additional mammographic images will be performed to determine location. 2-D and 3-D images of the right breast were obtained.   In the 9:30 to 10:00 position of the right breast at middle to posterior depth, there was a 1.7 x 1.9 cm spiculated mass with internal tissue marker clip corresponding to biopsy-proven   malignancy. The biopsy-proven malignancy is approximately 8 cm posterior to the right nipple. In the 8:30 to 9:00 position of the right breast at posterior depth, there was an 0.8 cm lobular equal density mass which was not definitively seen on 2-D mammogram images from 03/21/2013.   ?  Ultrasound:    -- Right breast ultrasound 07/04/19 [facility]) revealed hypoechoic spiculated mass with surrounding architectural distortion with in the right upper breast at 10:00, 6 cm FTN. Significant posterior acoustic shadowing. Overall extent 1.3 x 1.7 x 1.6 cm. Increased  cortical mantle thickening of a right axillary lymph node measuring up to 4 mm. BI-RADS 5.   -- Targeted right breast ultrasound 07/16/19 ([place]) revealed at 9:30, 9 cm from the nipple, demonstrated a 1.4 x 1.8 x 1.6 cm irregular hypoechoic mass with internal tissue marker clip corresponding to the biopsy-proven malignancy. In the right breast at 9:00, 12 cm from the nipple, there ws an 0.8 cm morphologically normal small  intramammary lymph node corresponding to the additional mammographic mass identified. No suspicious right axillary lymph nodes were seen. 3 morphologically normal right axillary lymph nodes were identified. The lymph node labeled #3, likely contains the internal tissue marker clip from outside biopsy.              OB/GYN HISTORY: Age at onset of menstruation 13. G2P2. She had her first child at age 31. LMP 03/2016 due to Mirena placement. Uterus and ovaries intact.     PRESENT THERAPY: Tamoxifen 20mg  daily started 12/2019    Jennifer Durham is here today for EOT visit.    She completed radiation with Dr Isabella Stalling.           Review of Systems   Constitutional:        + hot flashes    Respiratory: Positive for shortness of breath (with exercise).    Genitourinary:        Pre-menopausal   LMP 03/2016  Mirena in place   Uterus and ovaries intact    Musculoskeletal: Positive for myalgias.   Psychiatric/Behavioral: Positive for dysphoric mood. The patient is nervous/anxious.        Allergies   Allergen Reactions   ? Tegaderm RASH       Objective:         ? acetaminophen (TYLENOL EXTRA STRENGTH) 500 mg tablet Take 1,000 mg by mouth every 6 hours as needed for Pain. Max of 4,000 mg of acetaminophen in 24 hours.   ? DIPH/LIDO/ANTACID 1:1:1 (COMPOUND) Swish and Swallow 15 mL by mouth as directed before meals and at bedtime.   ? escitalopram oxalate (LEXAPRO) 10 mg tablet TAKE ONE TABLET BY MOUTH EVERY DAY   ? levonorgestrel (MIRENA) 20 mcg/24 hr (5 years) intrauterine device   1 EA, X 1 DOSE, Intrauteral, 1 EA, 0 Number of Refills, DEV   ? metFORMIN (GLUCOPHAGE) 1,000 mg tablet Take 1,000 mg by mouth twice daily with meals.   ? montelukast (SINGULAIR) 10 mg tablet Take 10 mg by mouth at bedtime daily.   ? tamoxifen (NOLVADEX) 20 mg tablet Take one tablet by mouth daily.   ? triamterene-hydrochlorothiazide (MAXZIDE) 75-50 mg tablet Take 1 tablet by mouth daily.     Vitals:    03/05/20 1505   PainSc: Three     There is no height or weight on file to calculate BMI.     Pain Score: Three  Pain Loc: Throat    Fatigue Scale: 7    Pain Addressed:  N/A    Patient Evaluated for a Clinical Trial: No treatment clinical trial available for this patient.     [place] Cooperative Oncology Group performance status is 0, Fully active, able to carry on all pre-disease performance without restriction.Marland Kitchen     Physical Exam  Vitals and nursing note reviewed.   Pulmonary:      Effort: No respiratory distress.      Comments: No breast exam today due to telehealth   Chest:      Breasts:  Right: No mass, skin change or tenderness.         Left: No mass, skin change or tenderness.       Lymphadenopathy:      Cervical: No cervical adenopathy.         Treatment Summary for Malignant neoplasm of upper-outer quadrant of right breast in female, estrogen receptor positive (HCC)     Kalle Bernath, Elenore Paddy, APRN-NP  03/05/2020  4:03 PM      Cancer Treatment Summary  Provided by Myrla Halsted, APRN-NP on 02/27/2020       General Information   Patient Name Jennifer Durham   Patient ID [id number]   Phone [phone] (home)    Date of birth 01/25/1976   Age 41 y.o.   Support Contact Extended Emergency Contact Information  Primary Emergency Contact: Jennifer Durham, Jennifer Durham [place]  Home Phone: [phone]  Relation: Spouse         Care Team   Patient Care Team:  Steva Ready, MD as PCP - General (Family Medicine)  Marlinda Mike, MD (Plastic Surgery)  Renford Dills, MD (Surgery)  Marygrace Drought, MD (Medical Oncology)      Cancer Diagnosis Information   Symptoms/Signs She noticed right breast dimpling    Diagnosis Right breast IDC and ILC   Diagnosis Date Right breast biopsy 07/04/2019   Staging Information Cancer Staging  Malignant neoplasm of upper-outer quadrant of right breast in female, estrogen receptor positive (HCC)  Staging form: Breast, AJCC 8th Edition  - Clinical stage from 07/04/2019: Stage IA (cT1c, cN0(f), cM0, G2, ER+, PR+, HER2-) - Signed by Guy Begin, PA-C on 07/12/2019  - Pathologic stage from 08/21/2019: Stage IA (pT1c, pN1a, cM0, G2, ER+, PR+, HER2-) - Signed by Guy Begin, PA-C on 08/30/2019 Tumor & Prognostic Markers ER/PR: positive  HER2: negative    Genomic Testing Oncotype DX score 11   Surgical Procedure: Location/Findings On 08/21/2019 Dr Vear Clock performed a right mastectomy and ALND and left maste[facility] at [facility]. Pathology showed IDC, grade II, measuring 1.8cm with 1 LN involved (1/34).             Background Information   Family History/predisposing conditions Family History   Problem Relation Age of Onset   ? Heart Attack Mother    ? Cancer Mother    ? Diabetes Mother    ? Cancer-Breast Mother 46   ? Heart Disease Mother    ? Hypertension Mother    ? Heart Disease Father    ? Hypothyroid Father    ? Hypertension Father    ? Thyroid Disease Father    ? Hypertension Brother    ? Cancer-Breast Maternal Aunt 50   ? Cancer-Ovarian Maternal Aunt 50   ? Diabetes Maternal Aunt    ? Arthritis-rheumatoid Maternal Aunt    ? Diabetes Maternal Grandmother    ? Heart Disease Maternal Grandfather       Genetic Testing [facility] 07/16/2019 negative    Social History Social History     Tobacco Use   ? Smoking status: Never Smoker   ? Smokeless tobacco: Never Used   Vaping Use   ? Vaping Use: Never used   Substance Use Topics   ? Alcohol use: Yes     Alcohol/week: 1.0 standard drink     Types: 1 Shots of liquor per week     Comment: 1x per month   ? Drug use: No     Vaping/E-liquid Use   ?  Vaping Use Never User                      Treatment Summary   Radiation Therapy Dr Isabella Stalling       CHEMOTHERAPY:   Cytoxan 600mg /m2 every 3 weeks x4 started 09/26/2019 and completed 11/28/2019   Taxotere 75mg /m2 every 3 weeks x4 started 09/26/2019 and completed 11/28/2019      ENDOCRINE THERAPY: Tamoxifen 20mg  daily started 12/2019         Follow-Up & Survivorship Care   SURVEILLANCE   1. Physical exam  ? Visit your Medical Oncologist every 3-6 months for the first 2 years after treatment then every 6-12 months for the next 3 years  ? Annual mammogram and clinical breast or chest wall exam  ? Continue to follow with your PCP once a year ? Monthly self-breast exam  2. Signs to watch for that could indication a local or distant recurrence of breast cancer   ? A new lump in the breast or irregular area of firmness  ? A new thickening in your breast   ? A new pulling back of the skin or dimpling at the lumpectomy site  ? Skin inflammation, rash or area of redness in the breast  ? Changes or new lumps in your breast or surgical scar or chest wall  ? Flattening or indentation of your nipple or other nipple changes like nipple discharge   ? A lump or swelling in the lymph nodes under your arm or in the groove above your collarbone  ? Pain in the back, hips, chest shoulders or ribs  ? Persistent cough  ? Shortness of breath or difficulty breathing   ? Loss of appetite  ? Persistent nausea, vomiting or weight loss  ? Swelling in the abdomen or pain in the abdomen   ? New headaches  3. Pelvic exam   ? Continue to visit a gynecologist regularly   4. Bone health  ? Bone Mineral Density  every 1-2 years  5. Colonoscopy  ? Baseline at age 89 and follow with PCP  6. Exercise/Weight management/Diet  ? Strive to maintain close to ideal body weight   ? Exercise at least 150 minutes per week with a combination of cardio and strength training  ? Maintain a healthy diet full of fresh fruits and vegetables  ? Vit D 1,000U-2,000U/daily unless  instructed otherwise   ? Consume alcohol in moderation  7. Lymphedema  ? Contact your provider if new swelling occurs                                   Assessment and Plan:    1. 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   2.  Jennifer Durham underwent right modified radical mastectomy on 08/21/2019.  Tumor size was 1.8 cm, invasive ductal carcinoma.  Grade 2.  1 out of 34 lymph nodes was positive.  The size of LN metastasis was 5 mm.  ER was 96%, PR 99%,, HER-2 0+ by IHC. Oncotype Dx RS 11.   3.  Since Jennifer Durham is premenopausal and has node positive disease, we recommend adjuvant chemotherapy.  After discussing the options of AC-T chemotherapy versus TC chemotherapy, we decided to proceed with TC for four cycles.  We discussed the data based on ABC trials  and breast cancer.  Jennifer Durham has significant history of heart disease in the family and is very concerned about Adriamycin causing heart damage.  Therefore TC seems like better choice with equivalent efficacy based on trials.  She completed 4 cycles of TC 11/28/2019.    4. She completed radiation with Dr Isabella Stalling. She is enrolled on the MA39 trail.   5. Due to her cancer being ER+; we are recommending endocrine therapy for 5-10 years. She startedTamoxifen 20mg  daily 12/2019. She was told the side effects including hot flashes, uterine cancer, blood clots and cataracts. She will continue to see GYN annually for pelvis exam. We will continue to check her E2 and if it is elevated then we will start ovarian function suppression.  She has a cooper IUD.      6. Hot flashes and mood changes; continue Lexapro 10mg .  7.  RTC with Dr Welton Flakes on 03/2020 with E2 level.      My collaborating MD Dr Raul Del.     For up to date information on the COVID-19 virus, visit the [facility] website. [email or web address]  ? General supportive care during cold and flu season and infection prevention reminders:   o Wash hands often with soap and water for at least 20 seconds  o Cover your mouth and nose  o Social distancing: try to maintain 6 feet between you and other people  o Stay home if sick and symptoms mild or manageable  - If you must be around people wear a mask    ? If you are having symptoms of a lower respiratory infection (cough, shortness of breath) and/or fever AND either traveled in last 30 days (internationally or to region of exposure) OR known exposure to patient with COVID19:    o Call your primary care provider for questions or health needs.   - Tell your doctor about your recent travel and your symptoms    o In a medical emergency, call 911 or go to the nearest emergency room.    Total Time Today was 40 minutes in the following activities: Preparing to see the patient, Obtaining and/or reviewing separately obtained history, Performing a medically appropriate examination and/or evaluation, Counseling and educating the patient/family/caregiver, Ordering medications, tests, or procedures and Documenting clinical information in the electronic or other health record    Myrla Halsted, APRN-NP

## 2020-03-10 ENCOUNTER — Encounter

## 2020-03-10 DIAGNOSIS — I10 Essential (primary) hypertension: Secondary | ICD-10-CM

## 2020-03-10 DIAGNOSIS — C50919 Malignant neoplasm of unspecified site of unspecified female breast: Secondary | ICD-10-CM

## 2020-03-10 DIAGNOSIS — J302 Other seasonal allergic rhinitis: Secondary | ICD-10-CM

## 2020-03-10 DIAGNOSIS — E8881 Metabolic syndrome: Secondary | ICD-10-CM

## 2020-03-10 DIAGNOSIS — E282 Polycystic ovarian syndrome: Secondary | ICD-10-CM

## 2020-03-17 ENCOUNTER — Encounter

## 2020-03-17 DIAGNOSIS — Z9189 Other specified personal risk factors, not elsewhere classified: Secondary | ICD-10-CM

## 2020-03-17 NOTE — Progress Notes
LYMPHEDEMA DATASHEET-FOLLOW-UP    DATE:  01/07/2020    PATIENT NAME:  Jennifer Durham  DATE OF BIRTH:  06-11-1975  MRN:   [id number]  Follow-Up Visit:  7 month (9 week intervention)  Lymphedema Diagnosis?:  Yes: Stage 1   DIAGNOSTIC DEFINITION OF LYMPHEDEMA:     > 2cm change from baseline volume     LYMPHEDEMA RISK FACTORS(CHECK ALL THAT APPLY):  Bilateral SSM/Right ALND (1/34)/TEs- 08/21/19, Adjuvant TC- completed 11/28/19, Radiation- completed 03/12/19   RECENT INFECTIONS?:  No   TYPE OF INFECTION(S):  N/A   USE OF COMPRESSION SLEEVE:  Unilateral    Jennifer Durham presents to clinic today for RUE swelling follow-up. Since her last appointment she has worn her compression sleeve daily while awake and her compression gauntlet nightly while asleep. She stretches and performs self-massage twice per day. She reports improvement in right hand swelling with this treatment regimen. She completed radiation on 03/12/19. 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 Isabella Stalling. She reports occasional aching to the inside of her right upper arm and is unsure if it is radiation or lymphedema related.      Reviewed with patient any recent changes in health history, infections, medications, or general concerns as it relates to lymphedema. She takes Tamoxifen. She first noticed swelling to her right arm and hand during adjuvant chemotherapy. She states the swelling gradually increased from initial onset through her last cycle of TC. After completion of chemotherapy the swelling remained constant and unchanged. Reinforced and verified adherence to precautions from initial visit. Reviewed early signs and symptoms of lymphedema. Discussed current activity level. She has returned to previous activity level without concern. She works as a Psychologist, forensic.     Assessment:    Circumferential Measurements  Bioimpedance Analysis   RUE/LUE  Unilateral: 11.1   Hand:  21.6 / 20.3 Wrist:  19.1 / 18.0       8 cm:  25.9 / 24.8     16 cm:  32.8 / 31.3     Elbow cm:  34.1 / 32.2     8 cm:  38.3 / 37.1     16 cm:  42.4 / 39.7     24 cm:  43.1/ 42.1     Handedness:  right handed     Notes:    Baseline: 2.4          BIS: 11.1 WNL, less than 3 standard deviation increase from baseline.  Bilateral upper extremity skin is pink, soft and intact. No rash, erythema or ulcerations noted.   Slight fullness present to 1st-3rd metacarpals on right hand  Fullness noted to right arm when compared with left arm, proximal mid-upper arm to distal mid-forearm  No pitting, stemmer's, or fibrosis noted.    Minimal decreased visualization of bony prominences and vasculature on right and when compared to left, improvement noted.  ROM: able to fully extend bilateral upper extremities above head without hesitation or guarding.  Strength: Normal and equal bilaterally.    Discussed assessment with patient. Discussed BIS results remain normal, however bilateral circumferential arm measurements continue to show areas of significant ( >2 cm) variance between arms. Reviewed early lymphedema treatment interventions. Patient is comfortable and satisfied with current treatment regimen. Reiterated importance of never wearing a daytime compression sleeve while sleeping and potential harmful affects from doing so. Request patient not wear her compression garments on day of next follow-up visit, to  ensure accurate evaluation is obtained. Verified patient is aware of worsening signs and symptoms to watch for as well as when to contact the clinic.     Lymphedema Stage:  Stage 1- RUE vs. Taxane induced    Recommendations:  Wear compression sleeve daily while awake, minimum of 8 hours per day. Wear compression gauntlet ONLY nightly while asleep. Self-massage and right side crawling the wall stretching exercise twice per day, morning and night. Continue with initial ALND risk-reduction precautions, meticulous skin care, weight management as well as active healthy lifestyle. Verified no referrals indicated at this time for any issues or concerns.    Plan:  Repeat BIS in coordination with Dr. Welton Flakes appointment on 04/07/20. If BIS results remain WNL, may trial decreasing daily compression sleeve use. Return to clinic on 05/12/20. 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 She request follow-up appointments be scheduled in coordination with breaks in her school schedule. This is a reasonable request. Patient agreeable with recommendations and plan. Follow-up appointment scheduled at time of today's appointment. Verified patient has contact information should any questions or concerns arise prior to follow-up appointment.

## 2020-03-18 ENCOUNTER — Ambulatory Visit: Admit: 2020-03-18 | Discharge: 2020-03-18 | Payer: BC Managed Care – PPO

## 2020-03-18 ENCOUNTER — Encounter: Admit: 2020-03-18 | Discharge: 2020-03-18 | Payer: BC Managed Care – PPO

## 2020-03-18 DIAGNOSIS — C50919 Malignant neoplasm of unspecified site of unspecified female breast: Secondary | ICD-10-CM

## 2020-03-18 DIAGNOSIS — J302 Other seasonal allergic rhinitis: Secondary | ICD-10-CM

## 2020-03-18 DIAGNOSIS — E282 Polycystic ovarian syndrome: Secondary | ICD-10-CM

## 2020-03-18 DIAGNOSIS — I10 Essential (primary) hypertension: Secondary | ICD-10-CM

## 2020-03-18 DIAGNOSIS — C50411 Malignant neoplasm of upper-outer quadrant of right female breast: Secondary | ICD-10-CM

## 2020-03-18 DIAGNOSIS — E8881 Metabolic syndrome: Secondary | ICD-10-CM

## 2020-03-18 MED ORDER — OXYCODONE 5 MG PO TAB
5-10 mg | ORAL_TABLET | ORAL | 0 refills | 6.00000 days | Status: AC | PRN
Start: 2020-03-18 — End: ?

## 2020-04-01 ENCOUNTER — Encounter: Admit: 2020-04-01 | Discharge: 2020-04-01 | Payer: BC Managed Care – PPO

## 2020-04-01 DIAGNOSIS — C50411 Malignant neoplasm of upper-outer quadrant of right female breast: Secondary | ICD-10-CM

## 2020-04-04 ENCOUNTER — Inpatient Hospital Stay: Admit: 2020-04-04 | Discharge: 2020-04-04 | Payer: BC Managed Care – PPO

## 2020-04-04 ENCOUNTER — Encounter: Admit: 2020-04-04 | Discharge: 2020-04-04 | Payer: BC Managed Care – PPO

## 2020-04-04 DIAGNOSIS — C50411 Malignant neoplasm of upper-outer quadrant of right female breast: Secondary | ICD-10-CM

## 2020-04-04 DIAGNOSIS — Z20822 Encounter for screening laboratory testing for COVID-19 virus in asymptomatic patient: Secondary | ICD-10-CM

## 2020-04-04 NOTE — Telephone Encounter
Called Jennifer Durham for lymphedema signs and symptoms update. She reports her swelling has remained the same as when she was last seen in clinic. She really only seems to notice swelling to the dorsum of her right hand, between 1-3 metacarpals. She states the swelling fluctuates "some days are better than others". Her current treatment regimen consist of compression sleeve use daily while awake, compression gauntlet only use nightly, and self-massage and stretching twice daily. She denies any aching or feelings of heaviness and/or fullness in her RUE.Marland Kitchen She is scheduled for a follow up BIS on 04/07/20 in coordination with her appointment with Dr. Humphrey Rolls. Advised Jennifer Durham not to wear her compression sleeve prior to that appointment, in order to obtain an accurate BIS measurement. If BIS result is normal, instructed Jennifer Durham to discontinue daily  compression sleeve use and return to sleeve use per initial ALND recommended activities only. I would like for her to continue wearing her compression gauntlet only nightly along with self-massage and stretching twice daily. Plan to follow up in clinic as scheduled on 05/12/20 at 9:00 am. Patient verbalized understanding and denied questions at this time. Verified patient has contact information should questions or concerns arise. Patient was appreciative of follow-up phone call.

## 2020-04-07 ENCOUNTER — Encounter: Admit: 2020-04-07 | Discharge: 2020-04-07 | Payer: BC Managed Care – PPO

## 2020-04-07 DIAGNOSIS — E8881 Metabolic syndrome: Secondary | ICD-10-CM

## 2020-04-07 DIAGNOSIS — E282 Polycystic ovarian syndrome: Secondary | ICD-10-CM

## 2020-04-07 DIAGNOSIS — C50911 Malignant neoplasm of unspecified site of right female breast: Secondary | ICD-10-CM

## 2020-04-07 DIAGNOSIS — C50919 Malignant neoplasm of unspecified site of unspecified female breast: Secondary | ICD-10-CM

## 2020-04-07 DIAGNOSIS — J302 Other seasonal allergic rhinitis: Secondary | ICD-10-CM

## 2020-04-07 DIAGNOSIS — Z9189 Other specified personal risk factors, not elsewhere classified: Secondary | ICD-10-CM

## 2020-04-07 DIAGNOSIS — I10 Essential (primary) hypertension: Secondary | ICD-10-CM

## 2020-04-07 LAB — ESTRADIOL (E2): Lab: <15 pg/mL

## 2020-04-07 NOTE — Progress Notes
Lymphedema Prevention Bioimpedance Spectroscopy (BIS) Monitoring    Notified patient via MyChart result was normal and to continue with routine follow up as scheduled.  Provided clinic contact information for any questions or concerns.     Reviewed BIS testing results from today.  Results:     Current: 7.3  Baseline: 2.4  Change from Baseline: 4.9    WNL less than 3 standard deviation increase from baseline.

## 2020-04-09 ENCOUNTER — Ambulatory Visit: Admit: 2020-04-09 | Discharge: 2020-04-09 | Payer: BC Managed Care – PPO

## 2020-04-11 ENCOUNTER — Encounter: Admit: 2020-04-11 | Discharge: 2020-04-11 | Payer: BC Managed Care – PPO

## 2020-04-11 ENCOUNTER — Ambulatory Visit: Admit: 2020-04-11 | Discharge: 2020-04-12 | Payer: BC Managed Care – PPO

## 2020-04-11 DIAGNOSIS — J302 Other seasonal allergic rhinitis: Secondary | ICD-10-CM

## 2020-04-11 DIAGNOSIS — C50919 Malignant neoplasm of unspecified site of unspecified female breast: Secondary | ICD-10-CM

## 2020-04-11 DIAGNOSIS — D1801 Hemangioma of skin and subcutaneous tissue: Secondary | ICD-10-CM

## 2020-04-11 DIAGNOSIS — I10 Essential (primary) hypertension: Secondary | ICD-10-CM

## 2020-04-11 DIAGNOSIS — D239 Other benign neoplasm of skin, unspecified: Secondary | ICD-10-CM

## 2020-04-11 DIAGNOSIS — E282 Polycystic ovarian syndrome: Secondary | ICD-10-CM

## 2020-04-11 DIAGNOSIS — Z853 Personal history of malignant neoplasm of breast: Secondary | ICD-10-CM

## 2020-04-11 DIAGNOSIS — L589 Radiodermatitis, unspecified: Secondary | ICD-10-CM

## 2020-04-11 DIAGNOSIS — D229 Melanocytic nevi, unspecified: Secondary | ICD-10-CM

## 2020-04-11 DIAGNOSIS — E8881 Metabolic syndrome: Secondary | ICD-10-CM

## 2020-04-11 MED ORDER — TRIAMCINOLONE ACETONIDE 0.1 % TP CREA
Freq: Two times a day (BID) | TOPICAL | 3 refills | Status: AC | PRN
Start: 2020-04-11 — End: ?

## 2020-04-11 NOTE — Progress Notes
Date of Service: 04/11/2020    Subjective:             Jennifer Durham is a 45 y.o. female.    History of Present Illness    New pt. referred by Self    # Bumps on scalp   - present for uncertain amount of time - when she had chemotherapy and lost her hair she noticed them for the first time  - not sure if they are changing  - not pruritic/bleeding/painful    # Brown spots on the head, trunk, arms, and legs  - Patient has a history of brown and tan spots distributed over the head, trunk, arms and legs.    - These have been present for many years.   - There is history of blistering sunburns. Remote history of tanning bed use 20 years ago, 2 x per week.   - None are itching, bleeding, or painful  - none are changing    # Radiation rash  - Upper mid chest  - sometimes it can be itchy    Personal Hx: no history of skin cancer. history of breast cancer (ER+, PR+, HER2-).   Family Hx: no history of melanoma skin cancer  Social Hx: Runner, broadcasting/film/video. Nonsmoker.        Review of Systems   Constitutional: Negative for appetite change and unexpected weight change.   Gastrointestinal: Negative for diarrhea, nausea and vomiting.         Objective:         ? acetaminophen (TYLENOL EXTRA STRENGTH) 500 mg tablet Take 1,000 mg by mouth every 6 hours as needed for Pain. Max of 4,000 mg of acetaminophen in 24 hours.   ? DIPH/LIDO/ANTACID 1:1:1 (COMPOUND) Swish and Swallow 15 mL by mouth as directed before meals and at bedtime.   ? escitalopram oxalate (LEXAPRO) 10 mg tablet TAKE ONE TABLET BY MOUTH EVERY DAY   ? metFORMIN (GLUCOPHAGE) 1,000 mg tablet Take 1,000 mg by mouth twice daily with meals.   ? montelukast (SINGULAIR) 10 mg tablet Take 10 mg by mouth at bedtime daily.   ? oxyCODONE (ROXICODONE) 5 mg tablet Take one tablet to two tablets by mouth every 6 hours as needed for Pain (rated 5/10 or greater, do not exceed 6 tabs in 24 hour period) Indications: pain   ? silver sulfADIAZINE (SSD) 1 % topical cream Apply to affected skin BID.   ? tamoxifen (NOLVADEX) 20 mg tablet Take one tablet by mouth daily.   ? triamterene-hydrochlorothiazide (MAXZIDE) 75-50 mg tablet Take 1 tablet by mouth daily.     Vitals:    04/11/20 1326   Resp: 16   Weight: 134.7 kg (297 lb)   Height: 182.9 cm (6')   PainSc: Zero     Body mass index is 40.28 kg/m?Marland Kitchen     Physical Exam  Gen: No acute distress, awake and alert  Eyes: No conjunctival injection, EOM grossly normal    Areas Examined (all normal unless noted below):  Head/Face  Neck  Chest/axillae  Back  Abdomen  R upper ext  L upper ext  R lower ext  L lower ext    Pertinent findings include:    Multiple brown and tan evenly pigmented macules are distributed over the examined areas.  All have symmetric similar dermoscopic findings with primarily globular and reticular patterns.    Poorly demarcated hyperpigmented slightly telangiectatic patch over central upper chest    Pink, violaceous papules with positive dimple sign and  central starburst pattern with peripheral fine tan reticular network on dermoscopy    Mobile nontender soft subcutaneous nodule on L post upper arm       Assessment and Plan:    # History of breast cancer  # Melanocytic nevi  - Will cont to monitor  - RTC for new/changing lesions  - provided patient with information on ABCD's of melanoma and photoprotection (including OTC sunscreen SPF 30+), and vitamin D    # Dermatofibroma  - reassurance of benign nature  - discussed excision if pt bothered by lesion    # Cherry Angiomas  - reassurance   - explained benign nature    # Radiation dermatitis  - Patient reports improving  - if symptomatic with itching, may use triamcinolone 0.1% cream BID prn up to 2 weeks per month. Do not use on face/armpits/groin/normal skin.     RTC in 12 months or sooner prn

## 2020-04-11 NOTE — Progress Notes
ATTESTATION    I personally performed the key portions of the E/M visit, discussed case with resident and concur with resident documentation of history, physical exam, assessment, and treatment plan unless otherwise noted.    Staff name:  Darl Pikes, MD Date:  04/11/2020

## 2020-04-11 NOTE — Patient Instructions
Vitamin D  - We recommend Vitamin D supplementation after a fatty meal, especially if there is no contraindications such as kidney stones    Moles  --Common melanocytic nevi (moles) tend to be =6 mm in diameter and symmetric with even pigmentation, round or oval shape, regular outline, and sharp, non-fuzzy border.  --Dermal nevi stick out from the skin, but if they are soft they are usually not worrisome.  --Flaky seemingly stuck-on brown bumps are usually benign keratoses, not moles.  --Bright red smooth bumps that do not bleed are usually benign blood vessel lesions (cherry angiomas), not moles.  --Atypical nevi/clinical features of possible melanoma include asymmetry, border irregularities, color variability, and diameter >6 mm.  The earliest sign of melanoma is usually a rapidly growing mole.  --About half of melanoma arises in an existing mole and up to half on normal skin.  --It is normal to get new moles until the age of 7-45 years old.  --Multiple atypical nevi are a marker of increased risk of melanoma. The risk of melanoma depends also upon the total number of nevi, family and/or personal history of melanoma, and sun exposure history.   --It is important to look at your moles once a month.  It may help to follow them with photos such as on your smart phone.  Looking once a month you can notice rapid changes and call if these occur.  --Using sunscreens and sun avoidance will decrease your risk of developing melanoma.  The best sun protection is sun avoidance, including with clothing such as long sleeves and a broad brimmed hat.  The best sunscreens are SPF 30 or above cream based with zinc oxide.  One ounce (shot glass sized) amount is needed for an adult.  It should be reapplied every 2 hours if possible.  --Any tanning bed use will increase your risk of melanoma significantly.    Sun Protection   UPF/SPF rated clothing (gloves, long sleeves, scarves); broad-brimmed hats (NOT ball caps!)   [employer] additive can increase the SPF value of your everyday clothing   Cowboy hats protect from sun; baseball hats don't   Here are several recommended zinc-based sunscreen brands in aplphabetical order (always read the ingredient list, as many brands have multiple varieties of sunscreens and not all are zinc-based)   Cyndia Bent, Coca [employer]a, [employer], Culp, [place], [employer]dess [employer], Green Screen,  [employer], Forensic scientist, SkinCeuticals, Vanicream   2 shot-glases = whole body    Melanoma Patient Information    Also called malignant melanoma     Skin cancer screening: If you notice a mole that differs from others or one that changes, bleeds, or itches, see a dermatologist.   Melanoma is a type of skin cancer. Anyone can get melanoma. When found early and treated, the cure rate is nearly 100%. Allowed to grow, melanoma can spread to other parts of the body. Melanoma can spread quickly. When melanoma spreads, it can be deadly.Dermatologists believe that the number of deaths from melanoma would be much lower if people:  Knew the warning signs of melanoma.   Learned how to examine their skin for signs of skin cancer.   Took the time to examine their skin.   It's important to take time to look at the moles on your skin because this is a good way to find melanoma early. When checking your skin, you should look for the ABCDEs of melanoma.     ABCDE's of melanoma:  When performing monthly  skin exams for your moles or new moles, remember the ABCDE's of melanoma:    A - Asymmetry. (Concerning if spot is not symmetric)  B - Border. (Irregular border or notched border are concerning)  C - Color. (Multiple colors or changes in color are concerning.)  D - Diameter. (Larger than 6mm, ie, a pencil eraser, is concerning.)  E - Evolution. (An evolving or changing spot is concerning. If new itch, tenderness, or bleeding develop, these are concerning changes too.  See further explanation below:    Melanoma: Signs and symptoms Anyone can get melanoma. It's important to take time to look at the moles on your skin because this is a good way to find melanoma early. When checking your skin, you should look for the ABCDEs of melanoma.     ABCDEs of melanoma     A = Asymmetry  One half is unlike the other half.       B = Border  An irregular, scalloped, or poorly defined border.       C = Color  Is varied from one area to another; has shades of tan, brown or black, or is sometimes white, red, or blue.       D = Diameter  Melanomas usually greater than 6mm (the size of a pencil eraser) when diagnosed, but they can be smaller.       E = Evolving  A mole or skin lesion that looks different from the rest or is changing in size, shape, or color.    !! If you see a mole or new spot on your skin that has any of the ABCDEs, immediately make an appointment to see a dermatologist.    Signs of melanoma  The most common early signs (what you see) of melanoma are:     Growing mole on your skin.   Unusual looking mole on your skin or a mole that does not look like any other mole on your skin (the ugly duckling).   Non-uniform mole (has an odd shape, uneven or uncertain border, different colors).     Symptoms of melanoma  In the early stages, melanoma may not cause any symptoms (what you feel). But sometimes melanoma will:    Itch.    Bleed.    Feel painful.   Many melanomas have these signs and symptoms, but not all. There are different types of melanoma. One type can first appear as a brown or black streak underneath a fingernail or toenail. Melanoma also can look like a bruise that just won't heal.     Who gets melanoma?  Anyone can get melanoma. Most people who get it have light skin, but people who have brown and black skin also get melanoma.   Some people have a higher risk of getting melanoma. These people have the following traits:   Skin    Fair skin (The risk is higher if the person also has red or blond hair and blue or green eyes). Sun-sensitive skin (rarely tans or burns easily).    50-plus moles, large moles, or unusual-looking moles.    If you have had bad sunburns or spent time tanning (sun, tanning beds, or sun lamps), you also have a higher risk of getting melanoma.   Men older than 50 are at a higher risk for developing skin cancers, including melanoma. Learning how to check your skin and getting skin exams can help detect skin cancer.    Family/medical history   Melanoma runs  in the family (parent, child, sibling, cousin, aunt, uncle had melanoma).   You had another skin cancer, but most especially another melanoma.   A weakened immune system.      Research shows that indoor tanning increases a person's melanoma risk by 75%. The risk also may increase if you had breast or thyroid cancer.    More people getting melanoma  Fewer people are getting most types of cancer. Melanoma is different. More people are getting melanoma. Many are white men who are 50 years or older. More young people also are getting melanoma. Melanoma is now the most common cancer among people 19-66 years old. Even teenagers are getting melanoma.    What causes melanoma?  Ultraviolet (UV) radiation is a major contributor in most cases. We get UV radiation from the sun, tanning beds, and sun lamps. Heredity also plays a role. Research shows that if a close blood relative (parent, child, sibling, aunt, uncle) had melanoma, a person has a much greater risk of getting melanoma.     How do dermatologists diagnose melanoma?  To diagnose melanoma, a dermatologist begins by looking at the patient's skin. A dermatologist will carefully examine moles and other suspicious spots. To get a better look, a dermatologist may use a device called a dermoscope.      Vitamin D    Our bodies need vitamin D to build strong and healthy bones. Vitamin D helps the body absorb the calcium that our bones require.     For a healthy person, the recommended daily dietary allowance is 600 international units for people of 56-6 years old, and 800 international units for people older than 71 years. More vitamin D is not better. Higher amounts of vitamin D could be harmful, leading to many health problems such as high blood pressure and kidney damage.     [employer] Dermatology recommending everyone get vitamin D from foods naturally rich in vitamin D, foods and beverages fortified with vitamin D or vitamin D supplements. The foods that contain the greatest amount are fatty fishes such as salmon, tuna and mackerel. Fish liver oil is another good source.     One of the sources to look up vitamin amount is through [employer]. [employer]. This can help you find out whether you get enough vitamin D from your diet. If you are like many people, you may not be getting your recommended dietary allowance of vitamin D. You may want to change the foods that you eat or take a vitamin D supplements. Before you start taking a vitamin D supplement, talk with your doctor.     Vitamin D is produced in the skin by UV light, but the amount is highly variable and depends on many factors. However, getting vitamin D from the sun or tanning beds can 1) increase your risk of developing skin cancer including melanoma which can be deadly, 2) resulting premature skin aging (wrinkles, age spots, blotchy complexion) and 3) leading to a weakened immune system. Therefore, Teacher, music of Dermatology recommend getting vitamin D safely from foods, beverages and supplements.

## 2020-04-20 ENCOUNTER — Encounter: Admit: 2020-04-20 | Discharge: 2020-04-20 | Payer: BC Managed Care – PPO

## 2020-04-20 MED ORDER — ESCITALOPRAM OXALATE 10 MG PO TAB
ORAL_TABLET | Freq: Every day | 1 refills
Start: 2020-04-20 — End: ?

## 2020-04-21 ENCOUNTER — Encounter: Admit: 2020-04-21 | Discharge: 2020-04-21 | Payer: BC Managed Care – PPO

## 2020-04-21 ENCOUNTER — Ambulatory Visit: Admit: 2020-04-21 | Discharge: 2020-04-21 | Payer: BC Managed Care – PPO

## 2020-04-21 DIAGNOSIS — C50411 Malignant neoplasm of upper-outer quadrant of right female breast: Secondary | ICD-10-CM

## 2020-04-21 MED ORDER — SODIUM CHLORIDE 0.9 % IJ SOLN
50 mL | Freq: Once | INTRAVENOUS | 0 refills | Status: CP
Start: 2020-04-21 — End: ?
  Administered 2020-04-21: 20:00:00 50 mL via INTRAVENOUS

## 2020-04-21 MED ORDER — IOHEXOL 350 MG IODINE/ML IV SOLN
100 mL | Freq: Once | INTRAVENOUS | 0 refills | Status: CP
Start: 2020-04-21 — End: ?
  Administered 2020-04-21: 20:00:00 100 mL via INTRAVENOUS

## 2020-04-22 ENCOUNTER — Encounter: Admit: 2020-04-22 | Discharge: 2020-04-22 | Payer: BC Managed Care – PPO

## 2020-04-22 ENCOUNTER — Ambulatory Visit: Admit: 2020-04-22 | Discharge: 2020-04-22 | Payer: BC Managed Care – PPO

## 2020-04-22 DIAGNOSIS — I10 Essential (primary) hypertension: Secondary | ICD-10-CM

## 2020-04-22 DIAGNOSIS — J302 Other seasonal allergic rhinitis: Secondary | ICD-10-CM

## 2020-04-22 DIAGNOSIS — E282 Polycystic ovarian syndrome: Secondary | ICD-10-CM

## 2020-04-22 DIAGNOSIS — C50411 Malignant neoplasm of upper-outer quadrant of right female breast: Secondary | ICD-10-CM

## 2020-04-22 DIAGNOSIS — C50919 Malignant neoplasm of unspecified site of unspecified female breast: Secondary | ICD-10-CM

## 2020-04-22 DIAGNOSIS — E8881 Metabolic syndrome: Secondary | ICD-10-CM

## 2020-04-22 NOTE — Progress Notes
Radiation Oncology Telehealth Visit Note    Date of Service: 04/22/2020    Subjective:      Participants:  Elby Showers, MD  Jennifer Durham (patient)    Obtained patient's verbal consent to treat them and their agreement to [facility] financial policy and NPP via this telehealth visit during the [facility]       Jennifer Durham is a 45 y.o. female.  She is being seen today via telehealth.  She is now 6 weeks out from completion of adjuvant radiation therapy.  On evaluation today, she reports that she is doing well.  Her skin still a little bit pink but is mostly healed.  She still has a little bit of itchiness and her skin is dry but she feels like her breasts are grossly symmetric.  She states that her healing did not show any surprises or anything out of the ordinary.  She had some small areas where she had a little skin peeling but those are all better and resolved.  She saw Dr. Park Breed last week and will see him again in September.  She has an appointment with Dr. Fran Lowes on Thursday, April 24, 2020.  She will see Dr. Vear Clock in May, 2022.  She has no complaints or concerns at this time.  She denies the need for in person visit for clinical exam at this time.    History of Present Illness  Jennifer Durham?is a 45 y.o.?female?with?Stage IA (cT1c, cN0(f), cM0, G2, ER+, PR+, HER2-).??Stage IA (pT1c, pN1a, cM0, G2, ER+, PR+, HER2-)  I personally reviewed?her?previous medical records during imaging, pathology, and other provider records,?to obtain some of this information.?Her?oncologic history is?briefly?as follows:  ?  The patient is a 45 year old female?with family history of breast cancer in her mother and maternal aunts.??She reports that she first?noticed some dimpling/changes in the right breast in May 2021.??She had already been scheduled to have a screening mammogram at that time and it was converted to a bilateral diagnostic mammogram.the diagnostic mammogram?was performed in [place], [place], on July 04, 2019.??It identified a spiculated mass with architectural distortion measuring up to 2 cm in the upper medial right breast, 10-11 o'clock, 7.7 cm posterior to the nipple there were also noted to be some slightly prominent asymmetric right axillary lymph nodes. ?On same-day ultrasound, the tumor was measured at 1.3 x 1.7 x 1.6 cm. ?There was increased cortical mantle thickening of the right axillary lymph node measuring up to 4 mm. ?BI-RADS 5. ?A right breast and right axillary biopsy performed in [place] on July 04, 2019 carcinoma. 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 ?She was seen by Dr. Robina Ade, and Dr. Raul Del, in the [facility] on July 16, 2019 for evaluation. ?New imaging was performed the same day at [facility]. ?It revealed a small mass in the probable 9 o'clock position right breast posterior depth, 2 cm inferior and 2 cm posterior to the known malignancy. ?In the 930 to 10 o'clock position of the right breast middle to posterior depth a 1.7 x 1.9 cm spiculated mass with internal tissue marker clip corresponding to biopsy-proven malignancy. ?In the 830 to 9 o'clock position of the right breast at posterior depth there was a 0.8 cm lobular equal density mass which is not definitively seen on the 2D mammogram images from [facility] 25th 2015. ?Targeted ultrasound at  [place] on July 16, 2019 showed a 1.4 x 1.8 x 1.6 cm irregular hypoechoic mass at the 930 location 9 cm from the nipple with internal tissue marker clip. ?It also noted in the right breast 9:00, 12 cm from the nipple, a 0.8 cm morphologically normal small intramammary lymph node corresponding to the additional mammographic mass identified. ?No suspicious right axillary lymph nodes were seen. ?She was recommended for upfront surgery. ?MRI of the bilateral breasts and genetic testing were ordered.  ?July 16, 2019?[facility] genetic testing did not identify a clinically significant mutation.  ?07/20/2019 MRI bilateral breasts confirmed the presence of an irregular spiculated mass in the right upper outer breast compatible with previous biopsy of malignancy and no enlarged or morphologically suspicious lymph nodes.  ?On August 21, 2019, the patient underwent bilateral skin sparing mastectomy and right sentinel lymph node biopsy, axilla lymph node dissection, and reconstruction. 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 ?A 5 mm focus of metastatic carcinoma was found in 1 of 4 sentinel lymph nodes. ?0 of 27 additional axillary nodes were involved. pT1c N1a M0 (1 of 34 LN+) M0. No LVI, No EIC, No ENE.  ?  ?Oncotype DX score 11.?Dr. Raul Del recommended adjuvant chemotherapy to consist of?TC every 3 weeks x4 started 09/26/2019. ?She completed 4 cycles 11/28/2019.?  ?  ?She was enrolled in the following clinical trial:  MA.39?([id number])?Tailor RT: A randomized trial of regional radiotherapy and biomarker low risk node positive breast cancer. ?To compare the breast cancer recurrence-free interval?(BCRFI)?between patients that received regional RT or not, defined as time from randomization to time of invasive recurrent disease in the ipsilateral chest wall, breast, regional nodes, distant sites or death due to breast cancer.  ?  She was randomized to the treatment arm and received adjuvant radiation therapy to the right reconstructed breast, chest wall, and regional nodes to a total of 4256 cGy in sixteen treatments. ?A brief description of her radiation treatment course is provided here:  ?  Treatment start date: 02/19/2020  Complete date: 03/11/2020  Dose per fraction: 2.66 Gray  Number fractions:?16  Total dose: 4256 Gray  Number of elapsed days:?16       Review of Systems Constitutional: Positive for fatigue.   All other systems reviewed and are negative.        Objective:         ? acetaminophen (TYLENOL EXTRA STRENGTH) 500 mg tablet Take 1,000 mg by mouth every 6 hours as needed for Pain. Max of 4,000 mg of acetaminophen in 24 hours.   ? DIPH/LIDO/ANTACID 1:1:1 (COMPOUND) Swish and Swallow 15 mL by mouth as directed before meals and at bedtime.   ? escitalopram oxalate (LEXAPRO) 10 mg tablet TAKE ONE TABLET BY MOUTH EVERY DAY   ? metFORMIN (GLUCOPHAGE) 1,000 mg tablet Take 1,000 mg by mouth twice daily with meals.   ? montelukast (SINGULAIR) 10 mg tablet Take 10 mg by mouth at bedtime daily.   ? oxyCODONE (ROXICODONE) 5 mg tablet Take one tablet to two tablets by mouth every 6 hours as needed for Pain (rated 5/10 or greater, do not exceed 6 tabs in 24 hour period) Indications: pain   ? silver sulfADIAZINE (SSD) 1 % topical cream Apply to affected skin BID.   ? tamoxifen (NOLVADEX) 20 mg tablet Take one tablet by mouth daily.   ? triamcinolone acetonide (TRIDERM) 0.1 % topical cream Apply  topically to affected  area twice daily as needed. Use up to 2 weeks per month. Do not use on face/armpits/groin/normal skin.   ? triamterene-hydrochlorothiazide (MAXZIDE) 75-50 mg tablet Take 1 tablet by mouth daily.           Computed Telehealth Body Mass Index unavailable. Necessary lab results were not found in the last year.    Physical Exam  Constitutional:       General: She is not in acute distress.     Appearance: Normal appearance. She is obese. She is not ill-appearing.   Pulmonary:      Effort: Pulmonary effort is normal. No respiratory distress.   Musculoskeletal:      Cervical back: Normal range of motion.   Neurological:      General: No focal deficit present.      Mental Status: She is alert and oriented to person, place, and time.   Psychiatric:         Mood and Affect: Mood normal.         Thought Content: Thought content normal.         Judgment: Judgment normal. 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 ?She is status post bilateral skin sparing mastectomy with temporary expander placement, right sentinel lymph node biopsy, axillary lymph node dissection and 1 of 34 lymph nodes positive for a 5 mm metastatic deposit, no LVI, no ENE, no EIC. ?Adjuvant TC chemotherapy x4 cycles is scheduled to be initiated on 09/26/2019. ?Fourth cycle completed 11/28/2019. ?She is a candidate for adjuvant postmastectomy radiation therapy to improve local regional control and breast cancer specific mortality.  She enrolled in MA.39?(BJY#[id number])?Tailor RT, and was randomized to the treatment arm.  She received a total of 4256 cGy in sixteen treatments which she completed between 02/19/2020 and 03/11/2020.      She is seen today via telehealth.  She reports that the acute toxicities of radiation therapy have essentially resolved.  She does still have a little bit of itching and pink discoloration but overall she is healed.  She is happy with the symmetry of the breasts.  She saw Dr. Park Breed last week and will see him again in September 2022.  She also has follow-ups already set up with her surgeon and plastic surgeon in the next couple of months.  We discussed plans for ongoing surveillance and follow-up.  She will keep her upcoming appointments with her other managing physicians and I will see her back in 6 months for in person exam.  She may contact me in the interim if there is questions or concerns requiring earlier assessment.    I am grateful for the opportunity to participate in the care of Ms. Schinke.    Please do not hesitate contact me if there are questions regarding today's visit.    Elby Showers, MD                         10 minutes spent on this patient's encounter with counseling and coordination of care taking >50% of the visit.

## 2020-04-24 ENCOUNTER — Encounter: Admit: 2020-04-24 | Discharge: 2020-04-24 | Payer: BC Managed Care – PPO

## 2020-04-24 ENCOUNTER — Ambulatory Visit: Admit: 2020-04-24 | Discharge: 2020-04-25 | Payer: BC Managed Care – PPO

## 2020-04-24 DIAGNOSIS — E282 Polycystic ovarian syndrome: Secondary | ICD-10-CM

## 2020-04-24 DIAGNOSIS — E8881 Metabolic syndrome: Secondary | ICD-10-CM

## 2020-04-24 DIAGNOSIS — I10 Essential (primary) hypertension: Secondary | ICD-10-CM

## 2020-04-24 DIAGNOSIS — J302 Other seasonal allergic rhinitis: Secondary | ICD-10-CM

## 2020-04-24 DIAGNOSIS — C50919 Malignant neoplasm of unspecified site of unspecified female breast: Secondary | ICD-10-CM

## 2020-04-24 DIAGNOSIS — Z923 Personal history of irradiation: Secondary | ICD-10-CM

## 2020-04-30 ENCOUNTER — Encounter: Admit: 2020-04-30 | Discharge: 2020-04-30 | Payer: BC Managed Care – PPO

## 2020-04-30 NOTE — Telephone Encounter
Returned Jennifer Durham call from 313 today.  She woke up this am with her right elbow red, hot and tender to the touch. No known injury or precipitating event.  She was seen today by her PCP that started her on Bactrium requesting that she take the first doses yet today.   She is running a low grade temp at 99.9 and PCP did mark the area for monitoring,  She has a follow up appointment on Friday (2 days from now).  We discussed not wearing compression sleeve until seen again by her PCP.  It is okay to restart wearing it once the warmth, tenderness is improved.  It may take a little longer for the redness to completely dissipate. Reviewed I would want to have her contact her PCP sooner than Friday if she develops a temp greater than 101.5 or significant increase in the redness, warmth, tenderness.  She is agreable.  She may also need to move/cancel the upcoming appointment in our clinic on the 18th if she is still having residual symptoms as this can be slow to fully resolve.  It would be okay if needed to do a Telehealth or wait until her next scheduled appointment in May as long as she is making gradual progress that is fine. Connected her to our team on Mychart for any needs or questions she may have.    Marland Kitchen

## 2020-05-12 ENCOUNTER — Encounter: Admit: 2020-05-12 | Discharge: 2020-05-12 | Payer: BC Managed Care – PPO

## 2020-05-12 DIAGNOSIS — Z9189 Other specified personal risk factors, not elsewhere classified: Secondary | ICD-10-CM

## 2020-05-12 NOTE — Progress Notes
LYMPHEDEMA DATASHEET-FOLLOW-UP    DATE:  01/07/2020    PATIENT NAME:  Jennifer Durham  DATE OF BIRTH:  1975-11-18  MRN:   [id number]  Follow-Up Visit:  9 month   Lymphedema Diagnosis?:  Yes   DIAGNOSTIC DEFINITION OF LYMPHEDEMA:     > 2cm change from baseline volume     LYMPHEDEMA RISK FACTORS(CHECK ALL THAT APPLY):  Bilateral SSM/Right ALND (1/34)/TEs- 08/21/19, Adjuvant TC- completed 11/28/19, Radiation- completed 03/12/19   RECENT INFECTIONS?:  No   TYPE OF INFECTION(S):  N/A   USE OF COMPRESSION SLEEVE:  Unilateral    Jennifer Durham presents to clinic today for RUE swelling follow-up. Reviewed with patient any recent changes in health history, infections, medications, or general concerns as it relates to lymphedema. She reports her right elbow was red, hot and tender to touch when she woke up on the morning of 04/30/20. She noticed the skin to her elbow was dry and had a little crack in it, but otherwise denies any known injury or precipitating event leading to onset of symptoms. She was diagnosed with cellulitis by her PCP that same day and prescribed Bactrim for treatment. She followed up with her PCP a few days later for a sinus infection and was given a shot of antibiotics. She returned to her PCP again the following week with persistent sinus infection symptoms and states she received 3 additional antibiotic shots during that visit. She reports cellulitis signs and symptoms gradually improved/resolved after initiation of oral antibiotics. She does note continued presence of some mild erythema. She has not worn her compression sleeve or performed self-massage since being diagnosed with cellulitis. She has continued with daily stretching. She continues to have visible swelling between the 2nd-3rd metacarpals of her right hand. She's noticed the swelling fluctuates and tends to worsen during times of increased activity, such as typing. She experiences occasional aching in her RUE, but admits to aching in her upper back and left arm as well. She denies feeling of heaviness or fullness in her RUE. She completed radiation on 03/12/19. She takes Tamoxifen. She is scheduled for DIEP reconstruction with Dr. Fran Lowes on July 02, 2020. Discussed current activity level. She has returned to previous activity level without concern. She works as a Psychologist, forensic. Her and her husband have a cruise vacation planned in August to celebrate their 20th wedding anniversary.     She first noticed swelling to her right arm and hand during adjuvant chemotherapy. She states the swelling gradually increased from initial onset through her last cycle of TC. After completion of chemotherapy the swelling has remained constant and unchanged.      Assessment:    Circumferential Measurements  Bioimpedance Analysis   RUE/LUE  Unilateral: 12.0   Hand:  21.5 / 20.0     Wrist:  19.2 / 18.0       8 cm:  25.9 / 25.2     16 cm:  32.1 / 30.8     Elbow cm:  34.5 / 32.7     8 cm:  39..6 / 37.5     16 cm:  42.8 / 39.2     24 cm:  43.6 / 42.3     Handedness:  right handed     Notes:    Baseline: 2.4          BIS: 12.0 WNL, less than 3 standard deviation increase from baseline.  Bilateral upper extremity skin is pink, soft and intact. No rash or ulcerations  noted.   Mild erythema noted right arm, distal mid-forearm to proximal mid-upper arm. Blanches to touch.  Edema present between 2nd-3rd metacarpals on right hand.  Fullness noted to right arm when compared with left arm, distal mid-forearm to proximal mid-upper arm. No stemmer's, or fibrosis noted.    ROM: able to fully extend bilateral upper extremities above head without hesitation or guarding.  Strength: Normal and equal bilaterally.    Discussed assessment with patient. Discussed BIS results remain normal, however bilateral circumferential arm measurements continue to show areas of significant ( >2 cm) variance between arms. Reviewed possible causes of swelling as lymphedema vs. Taxane induced. Explained Taxane induced swelling usually doesn't respond to use of compression garments like lymphedema swelling does. Previous degree of swelling was likely aggravated by recent cellulitis infection. Discussed increased risk for infection in affected arm and importance of maintaining good skin integrity. Reviewed early lymphedema treatment interventions. Discussed returning to daily compression sleeve use and nightly gauntlet ONLY use. Reiterated importance of never wearing a daytime compression sleeve while sleeping and potential harmful affects from doing so. Reviewed self-massage, stretching, and ALND risk-reduction precautions from initial visit. Request patient not wear her compression sleeve prior to next follow-up visit, to ensure accurate evaluation is obtained. Verified patient is aware of worsening signs and symptoms to watch for as well as when to contact the clinic.     Lymphedema Stage:  Stage 1- RUE vs. Taxane induced    Recommendations:  Wear compression sleeve daily while awake, minimum of 8 hours per day. Wear compression gauntlet ONLY nightly while asleep. Self-massage and right side crawling the wall stretching exercise twice per day, morning and night. Continue with initial ALND risk-reduction precautions, meticulous skin care, weight management as well as active healthy lifestyle. Verified no referrals indicated at this time for any issues or concerns.    Plan:  Follow-up scheduled for 06/05/20 in coordination with Dr. Vear Clock appointment. If BIS results remain WNL, may trial decreasing compression sleeve use to ALND recommended activities. 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 Fran Lowes. Ideally sometime around 6 weeks post-op. Patient is agreeable with recommendations and plan. Verified patient has contact information should any questions or concerns arise prior to follow-up appointment.

## 2020-05-19 ENCOUNTER — Encounter: Admit: 2020-05-19 | Discharge: 2020-05-19 | Payer: BC Managed Care – PPO

## 2020-06-05 ENCOUNTER — Encounter: Admit: 2020-06-05 | Discharge: 2020-06-05 | Payer: BC Managed Care – PPO

## 2020-06-05 ENCOUNTER — Ambulatory Visit: Admit: 2020-06-05 | Discharge: 2020-06-06 | Payer: BC Managed Care – PPO

## 2020-06-05 DIAGNOSIS — J302 Other seasonal allergic rhinitis: Secondary | ICD-10-CM

## 2020-06-05 DIAGNOSIS — E8881 Metabolic syndrome: Secondary | ICD-10-CM

## 2020-06-05 DIAGNOSIS — I10 Essential (primary) hypertension: Secondary | ICD-10-CM

## 2020-06-05 DIAGNOSIS — I890 Lymphedema, not elsewhere classified: Secondary | ICD-10-CM

## 2020-06-05 DIAGNOSIS — E282 Polycystic ovarian syndrome: Secondary | ICD-10-CM

## 2020-06-05 DIAGNOSIS — C50919 Malignant neoplasm of unspecified site of unspecified female breast: Secondary | ICD-10-CM

## 2020-06-05 DIAGNOSIS — Z923 Personal history of irradiation: Secondary | ICD-10-CM

## 2020-06-05 DIAGNOSIS — C50411 Malignant neoplasm of upper-outer quadrant of right female breast: Secondary | ICD-10-CM

## 2020-06-05 NOTE — Progress Notes
Name: Jennifer Durham          MRN: [id number]      DOB: December 11, 1975      AGE: 45 y.o.   DATE OF SERVICE: 06/05/2020    Subjective:             Reason for Visit:  Heme/Onc Care    Jennifer Durham is a 45 y.o. female.     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 Hubby is a Caucasian female who presented to the [facility] on 07/16/2019 at age 52 for evaluation of right breast cancer. Ms. Sato noted right breast dimpling in May 2021. She was sent for diagnostic imaging at an outside facility on 07/04/19. At that time a new right breast mass was seen as well as a lymph node with cortical thickening and biopsy was recommended. Right breast sono-guided biopsy 07/04/19 [name]) revealed grade 2 invasive cancer with mixed ductal and lobular features. Right axillary sono-guided biopsy 07/04/19 [name]) revealed fragments of polymorphous lymphoid tissue. No evidence of malignancy. Ms. Chaudoin underwent bilateral skin sparing mastectomy/right SLNB converted to ALND/TEs on 08/21/19. She finished radiation with Dr. Isabella Stalling on 03/11/20.    PATHOLOGY:  Tumor:  1.8 cm IDC  Margins Free From Tumor:  Yes  ER:  positive   PR:  positive    Her 2:  negative  Grade:  2  Lymph Nodes:  1/34  LVSI:  no  Extranodal extension:  no    INTERVAL HISTORY:  Today she presents for 9 month follow up. She is scheduled for DIEP with Dr. Fran Lowes in June. She states she overall feels much better. Still dealing with mild fatigue by the end of the day. She denies any new breast masses or enlarged lymph nodes. She continues to have mild right upper extremity swelling due to lymphedema. She continues to perform manual massage and compression and is following with lymphedema clinic. She did recently get sunburned which exacerbated some swelling. She denies any cellulitis. Her B/L Tes do occasionally flip - scheduled to see Dr. Fran Lowes today for her pre-op appointment.      BREAST IMAGING:  Mammogram:    -- Bilateral diagnostic mammogram 07/04/19 [name]) revealed heterogeneously dense breast tissue. Spiculated mass with surrounding architectural distortion measuring up to 2 cm in the upper right medial breast at 10-11:00, 7.7 cm posterior to the nipple. Slightly prominent asymmetric right axillary lymph nodes.  -- Right diagnostic mammogram 07/16/19 ([facility]) revealed an the outside right MLO view from 07/04/2019, there was a small mass in the probable 9:00 position of the right breast at posterior depth, 2 cm inferior and 2 cm posterior to the known malignancy. Additional mammographic images will be performed to determine location. 2-D and 3-D images of the right breast were obtained.   In the 9:30 to 10:00 position of the right breast at middle to posterior depth, there was a 1.7  x 1.9 cm spiculated mass with internal tissue marker clip corresponding to biopsy-proven   malignancy. The biopsy-proven malignancy is approximately 8 cm posterior to the right nipple. In the 8:30 to 9:00 position of the right breast at posterior depth, there was an 0.8 cm lobular equal density mass which was not definitively seen on 2-D mammogram images from 03/21/2013.     Ultrasound:    -- Right breast ultrasound 07/04/19 [facility]) revealed hypoechoic spiculated mass with surrounding architectural distortion with in the right upper breast at 10:00, 6 cm FTN. Significant posterior acoustic shadowing. Overall extent 1.3 x 1.7 x 1.6 cm. Increased cortical mantle thickening of a right axillary lymph node measuring up to 4 mm. BI-RADS 5.   -- Targeted right breast ultrasound 07/16/19 ([place] [facility]) revealed at 9:30, 9 cm from the nipple, demonstrated a 1.4 x 1.8 x 1.6 cm irregular hypoechoic mass with internal tissue marker clip corresponding to the biopsy-proven malignancy. In the right breast at 9:00, 12 cm from the nipple, there ws an 0.8 cm morphologically normal small  intramammary lymph node corresponding to the additional mammographic mass identified. No suspicious right axillary lymph nodes were seen. 3 morphologically normal right axillary lymph nodes were identified. The lymph node labeled #3, likely contains the internal tissue marker clip from outside biopsy.     REPRODUCTIVE HEALTH:  Age at first Menarche:   72  Age at First Live Birth:  89  Age at Menopause:  Premenopausal - has mirena  Gravida:  2  Para: 2  Breastfeeding:  Yes    PROCEDURE: Bilateral skin sparing mastectomy/right ALND/TEs, 08/21/19  PERTINENT PMH:  Insulin resistance, HTN  FAMILY HISTORY:  Mother-Breast cancer diagnosed in her early to mid 16's. Maternal Aunt-Breast cancer diagnosed in her 103's. Maternal Aunt-Ovarian cancer diagnosed in her early 16's  PHYSICAL EXAM on PRESENTATION: Right -  2cm palpable mass at 9:30 w/ associated skin dimpling. No nipple discharge. Left - No palpable breast masses. No skin, nipple, or areolar change. No supraclavicular or axillary adenopathy.   MEDICAL ONCOLOGY:  Dr. Welton Flakes  PRESENT THERAPY: Oncotype DX 11; Adjuvant TC finished 09/26/19; tamoxifen started 12/2019  REFERRED BY:  Melissa Huntington, PA       Review of Systems   Constitutional: Positive for fatigue.   HENT: Negative.    Eyes: Negative.    Respiratory: Negative.    Cardiovascular: Negative.    Gastrointestinal: Negative.    Endocrine: Negative.    Genitourinary: Negative.    Musculoskeletal: Negative.    Skin: Negative.    Allergic/Immunologic: Negative.    Neurological: Negative.    Hematological: Negative. Psychiatric/Behavioral: The patient is nervous/anxious.        Medical History:   Diagnosis Date   ? Breast cancer (HCC) 07/04/2019    right   ? Hx of radiation therapy 2022   ? Hypertension    ? Insulin resistance    ? PCOS (polycystic ovarian syndrome)    ? Seasonal allergies     Frequent sinusitis     Surgical History:   Procedure Laterality Date   ? HX TONSILLECTOMY  2001    turbinates/adenoids 2015   ? HX TONSIL AND ADENOIDECTOMY  2011   ? LEFT PERONEAL TENDON REPAIR Left 12/15/2016    Performed by Vopat, Lowry Ram, MD at [facility]   ? Left Skin Sparing Mastectomy Left 08/21/2019    Performed by Renford Dills, MD at [facility]   ? IDENTIFICATION SENTINEL LYMPH NODE Right 08/21/2019  Performed by Renford Dills, MD at [facility]   ? INJECTION RADIOACTIVE TRACER FOR SENTINEL NODE IDENTIFICATION Right 08/21/2019    Performed by Renford Dills, MD at [facility]   ? RADIOLOGICAL EXAM SURGICAL SPECIMEN Right 08/21/2019    Performed by Renford Dills, MD at [facility]   ? MASTECTOMY - MODIFIED RADICAL WITH AXILLARY LYMPH NODE DISSECTION Right 08/21/2019    Performed by Renford Dills, MD at [facility]   ? RECONSTRUCTION BREAST WITH TISSUE EXPANDER AND SUBSEQUENT EXPANSION - IMMEDIATE/ DELAYED Bilateral 08/21/2019    Performed by Marlinda Mike, MD at [facility]   ? IMPLANTATION BIOLOGIC IMPLANT FOR SOFT TISSUE REINFORCEMENT Bilateral 08/21/2019    Performed by Marlinda Mike, MD at [facility]     Family History   Problem Relation Age of Onset   ? Heart Attack Mother    ? Cancer Mother    ? Diabetes Mother    ? Cancer-Breast Mother 45   ? Heart Disease Mother    ? Hypertension Mother    ? Heart Disease Father    ? Hypothyroid Father    ? Hypertension Father    ? Thyroid Disease Father    ? Hypertension Brother    ? Cancer-Breast Maternal Aunt 50   ? Cancer-Ovarian Maternal Aunt 50   ? Diabetes Maternal Aunt    ? Arthritis-rheumatoid Maternal Aunt    ? Diabetes Maternal Grandmother    ? Heart Disease Maternal Grandfather      Social History Socioeconomic History   ? Marital status: Married   Tobacco Use   ? Smoking status: Never Smoker   ? Smokeless tobacco: Never Used   Vaping Use   ? Vaping Use: Never used   Substance and Sexual Activity   ? Alcohol use: Yes     Alcohol/week: 1.0 standard drink     Types: 1 Shots of liquor per week     Comment: 1x per month   ? Drug use: No   ? Sexual activity: Yes     Partners: Male     Birth control/protection: I.U.D.     Vaping/E-liquid Use   ? Vaping Use Never User                 Objective:         ? acetaminophen (TYLENOL EXTRA STRENGTH) 500 mg tablet Take 1,000 mg by mouth every 6 hours as needed for Pain. Max of 4,000 mg of acetaminophen in 24 hours.   ? DIPH/LIDO/ANTACID 1:1:1 (COMPOUND) Swish and Swallow 15 mL by mouth as directed before meals and at bedtime.   ? escitalopram oxalate (LEXAPRO) 10 mg tablet TAKE ONE TABLET BY MOUTH EVERY DAY   ? metFORMIN (GLUCOPHAGE) 1,000 mg tablet Take 1,000 mg by mouth twice daily with meals.   ? montelukast (SINGULAIR) 10 mg tablet Take 10 mg by mouth at bedtime daily.   ? oxyCODONE (ROXICODONE) 5 mg tablet Take one tablet to two tablets by mouth every 6 hours as needed for Pain (rated 5/10 or greater, do not exceed 6 tabs in 24 hour period) Indications: pain   ? silver sulfADIAZINE (SSD) 1 % topical cream Apply to affected skin BID.   ? tamoxifen (NOLVADEX) 20 mg tablet Take one tablet by mouth daily.   ? triamcinolone acetonide (TRIDERM) 0.1 % topical cream Apply  topically to affected area twice daily as needed. Use up to 2 weeks per month. Do not use on face/armpits/groin/normal  skin.   ? triamterene-hydrochlorothiazide (MAXZIDE) 75-50 mg tablet Take 1 tablet by mouth daily.     Vitals:    06/05/20 0756   BP: 122/86   BP Source: Arm, Left Upper   Pulse: 104   Temp: 36.7 ?C (98 ?F)   SpO2: 94%   TempSrc: Temporal   PainSc: Zero   Weight: (!) 138.5 kg (305 lb 6.4 oz)   Height: 181 cm (5' 11.25)  Comment: 06/05/2020     Body mass index is 42.3 kg/m?Marland Kitchen     Pain Score: Zero       Fatigue Scale: 5 (tired all the time)    Pain Addressed:  N/A    Patient Evaluated for a Clinical Trial: No treatment clinical trial available for this patient.     [place] Cooperative Oncology Group performance status is 0, Fully active, able to carry on all pre-disease performance without restriction.     Physical Exam     Constitutional: Alert, cooperative, NAD  Eyes: non-icteric  ENT: NCAT, moist mucous membranes  Cardiovascular: normal rate, regular rhythm  Respiratory: non-labored respirations on RA  Skin: warm, dry, no rashes or erythema, no cyanosis  Musculoskeletal: normal range of motion, no edema  Neuro: alert and orientated, moves all extremities, no cranial nerve deficit  Psych: behavior is normal, judgment and thought content normal  Lymphatic: mild RUE swelling with minimal residual erythema from recent sunburn    RIGHT BREAST EXAM:  Breast:  TE in place. No palpable masses, skin changes. Incisions well healed.  Skin Erythema:  No  Attachment of Overlying Skin:  No  Peau d' orange:  No  Chest Wall Attachment:  No  Nipple Inversion:  No  Nipple Discharge: No    LEFT BREAST EXAM:  Breast:  TE in place. No palpable masses, skin changes. Incisions well healed.  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 Shaddock continues to do well post-operatively. She has no evidence of recurrence on clinical exam today. She is scheduled for a DIEP with Dr. Fran Lowes in June - pre-op appointment scheduled for today. She has completed radiation but continues to follow with Dr. Isabella Stalling. She continues to take tamoxifen for hormonal therapy and follows with Dr. Welton Flakes with medical oncology. She met with lymphedema today with a repeat BIS and was encouraged to continue her exercises and local compression and treatments and to reach out if her symptoms worsen. She should continue to avoid blood pressure cuff, lab draws on her right side. She will follow up in 1 year with a clinical exam to monitor for recurrence with an APP appointment.    Summary of plan is as follows:    1. Continue follow up with Dr. Fran Lowes  2. Continue follow up with Dr. Welton Flakes  3. Continue follow up with Dr. Isabella Stalling  4. Continue follow  up with lymphedema  5. RTC in 1 year with clinic exam with APP    Patient seen and discussed with Dr. Vear Clock, who directed plan of care    Tana Conch, DO  General Surgery PGY-2    ATTESTATION    I personally performed the key portions of the E/M visit, discussed case with resident and concur with resident documentation of history, physical exam, assessment, and treatment plan unless otherwise noted.    Staff name:  Renford Dills, MD Date:  06/05/2020

## 2020-06-05 NOTE — Progress Notes
Bioimpedance Spectroscopy performed.  Advised patient that additional information will be sent via Mychart (preferred) or phone if indicated by the lymphedema nurse within 24 hours.

## 2020-06-05 NOTE — Progress Notes
LYMPHEDEMA DATASHEET-FOLLOW-UP    DATE:  06/05/2020    PATIENT NAME:  Jennifer Durham  DATE OF BIRTH:  12/21/1975  MRN:   [id number]  Follow-Up Visit:  9 month ALND follow up and 4 week return for right arm lymphedema  Surgical History: 08/21/19: Bilateral skin sparing mastectomy, Right SLN to ALND (1/34), reconstruction (TE) with Jennifer Durham Jennifer Durham  Lymphedema Diagnosis?:  Yes: Stage 1 Unstable with interventions.  LYMPHEDEMA INSTRUMENT:   PATIENT POPULATION:    Diagnosed with and treated for cancer at [facility]?  Yes    Referred to [facility] after outside cancer treatment?  No    Referred to [facility] for lymphedema management?  No     DIAGNOSTIC DEFINITION OF LYMPHEDEMA:     > = 10% change from baseline volume and > 2cm change from baseline volume     IMPACT OF LE ON ADL:   CHANGES IN BMI (UPON LE DIAGNOSIS):  Stable    LYMPHEDEMA RISK FACTORS(CHECK ALL THAT APPLY):  Axillary Node Dissection, Skin-sparing Mastecomy, SLN Mapping, Reconstruction  and Bilateral Mastectomy   RECENT INFECTIONS?:  Yes   TYPE OF INFECTION(S):  Bacterial - cellulitis 04/30/20 - treated with Bactrim by PCP   CHANGE IN PRESCRIBED MEDICATION?:  No   USE OF COMPRESSION SLEEVE:  Unilateral    Bioimpedance Analysis  Current: 17.1  Baseline: 2.4  Change from Baseline: 14.7 - increased from 9.6 last visit (4/18)    Handedness:  right handed    Circumferential Measurements   RUE/LUE   Hand:  22.9 / 21.5    Wrist:  20.2 / 19.4     8 cm:  25.0 / 24.4   16 cm:  32.4 / 31.3   Elbow cm:  37.4 / 33.7   8 cm:  40.9 / 37.9   16 cm:  44.9 / 41.2   24 cm:  47.5 / 44.4   22 cm from wrist: 35.5 / 33.7  BMI: 42.3     Compression Garment Sizes:  Sleeve: Did not bring sleeve to visit to obtain size.     Radiation Therapy: completed 03/12/19  Adjuvant Chemotherapy: TC 09/26/19- 11/28/19     Jennifer Durham presents to clinic for her 9 month follow up with Jennifer Durham as well as her 4 week return for right arm lymphedema.  Reviewed with patient any recent changes in health history, infections, medications, or general concerns as it relates to lymphedema.  Reinforced and verified adherence to precautions from initial visit. She recently was diagnosed with cellulitis on 04/30/20 and her PCP started her on Bactrim. She follow up with Jennifer Harvest, RN on 05/12/20. The plan discussed at that visit was to wear her compression sleeve for 8 hours daily, wear compression gauntlet during the nighttime, and complete MLD and stretching BID. Jennifer Durham reported that she has been compliant with her interventions. She did however get a sunburn this last weekend while mowing the lawn (riding mower) without her sleeve on. She stated that she used sunscreen but her shoulders still got burnt. We discussed the importance of protecting her skin especially while in the sun. She is scheduled for her DIEP reconstruction with Jennifer Durham on 6/8. She first noticed swelling to her right arm and hand during adjuvant chemotherapy. She states the swelling gradually increased from initial onset through her last cycle of TC. After completion of chemotherapy the swelling has remained constant and unchanged.      Assessment:  BIS: Elevated, less than 3 standard deviation increase  from baseline.  Right arm:  Patient reports feelings of fullness along the posterior portion of her right upper arm extending down to her elbow.   Bilateral upper extremity skin is pink, soft and intact. No rash, erythema or ulcerations noted.   Edema: present - right arm visibly fuller than her left.  No pitting, stemmer's, or fibrosis noted.  Skin pliable  Normal visualization of bony prominences and vasculature on left and when compared to right. Area of slight fullness noted at base of right thumb.   ROM: able to fully extend bilateral upper extremities above head without tightness, hesitation, and guarding.  Strength: Normal and equal bilaterally.        Discussed assessment with patient. Reviewed Elevated BIS result and circumferential arm measurements with areas of greater than 2 cm difference between arms.  Early lymphedema signs and symptoms, potential aggravating factors, lymphatic drainage pathways, treatment interventions, and risks of not treating discussed. Discussed right side crawling the wall stretching exercise and MLD technique. Regla did not have any questions or concerns regarding her interventions. Discussed the importance of protecting her skin especially from the sun. Verified patient is aware of worsening signs and symptoms to watch for as well as when to contact the clinic. Instructed Magdalyn to continue with interventions until her DIEP reconstruction with Jennifer Durham on 07/02/20. I will reach out to Jennifer Durham regarding the plan to reincorporate her interventions after surgery.     Lymphedema Stage:  Stage 1     Recommendations:  Wear compression sleeve daily while awake. Wear compression gauntlet during the night. Self-massage and right side crawling the wall stretching exercise twice per day, morning and night. Continue with precautions, meticulous skin care, weight management as well as active healthy lifestyle.  Verified no referrals indicated at this time for any issues or concerns.    Plan:  Return to clinic in 5 weeks in coordination with her post op with Jennifer Durham. Patient agreeable with recommendations and plan. Follow-up appointment scheduled at time of today's appointment. Verified patient has contact information should any questions or concerns arise.

## 2020-06-09 ENCOUNTER — Encounter: Admit: 2020-06-09 | Discharge: 2020-06-09 | Payer: BC Managed Care – PPO

## 2020-06-09 NOTE — Telephone Encounter
I called Monette to discuss the feedback that I received from Dr. Billie Ruddy regarding her lymphedema interventions after her DIEP reconstruction. Dr. Billie Ruddy said that Makyia would be ok to wear her compression sleeve post op and we should start with passive stretches and massage. I instructed Starnisha to continue with her regular regimen until the night before surgery. She should wash her sleeve and put it in her bag to bring with her to the hospital. She should double check with the surgery team before she dons her sleeve while inpatient. While in the hospital she should follow the instructions/orders from the surgical team and not include our interventions until after I see her on 6/16 after her post op visit. I assume that the Plastics team will have her completing passive stretches post operatively. We will discuss adding the massage after her post op visit. Ilianna verbalized understanding and did not have any questions or concerns.

## 2020-06-17 ENCOUNTER — Encounter: Admit: 2020-06-17 | Discharge: 2020-06-17 | Payer: BC Managed Care – PPO

## 2020-06-17 ENCOUNTER — Ambulatory Visit: Admit: 2020-06-17 | Discharge: 2020-06-17 | Payer: BC Managed Care – PPO

## 2020-06-17 DIAGNOSIS — C50919 Malignant neoplasm of unspecified site of unspecified female breast: Secondary | ICD-10-CM

## 2020-06-17 DIAGNOSIS — E8881 Metabolic syndrome: Secondary | ICD-10-CM

## 2020-06-17 DIAGNOSIS — J302 Other seasonal allergic rhinitis: Secondary | ICD-10-CM

## 2020-06-17 DIAGNOSIS — Z789 Other specified health status: Secondary | ICD-10-CM

## 2020-06-17 DIAGNOSIS — Z01818 Encounter for other preprocedural examination: Secondary | ICD-10-CM

## 2020-06-17 DIAGNOSIS — E282 Polycystic ovarian syndrome: Secondary | ICD-10-CM

## 2020-06-17 DIAGNOSIS — I10 Essential (primary) hypertension: Secondary | ICD-10-CM

## 2020-06-17 DIAGNOSIS — Z923 Personal history of irradiation: Secondary | ICD-10-CM

## 2020-06-17 DIAGNOSIS — R12 Heartburn: Secondary | ICD-10-CM

## 2020-06-17 LAB — CBC AND DIFF
ABSOLUTE BASO COUNT: 0 K/UL (ref 0–0.20)
ABSOLUTE EOS COUNT: 0.3 K/UL (ref 0–0.45)
ABSOLUTE MONO COUNT: 0.4 K/UL (ref 0–0.80)
MCH: 30 pg (ref 26–34)
RDW: 13 % (ref 11–15)
WBC COUNT: 6.3 K/UL (ref 4.5–11.0)

## 2020-06-17 LAB — BASIC METABOLIC PANEL
CHLORIDE: 101 MMOL/L — ABNORMAL LOW (ref 98–110)
CO2: 29 MMOL/L (ref 21–30)
GLUCOSE,PANEL: 109 mg/dL — ABNORMAL HIGH (ref 70–100)
POTASSIUM: 3.7 MMOL/L — ABNORMAL LOW (ref 3.5–5.1)
SODIUM: 142 MMOL/L — ABNORMAL LOW (ref 137–147)

## 2020-06-17 NOTE — Pre-Anesthesia Patient Instructions
GENERAL INFORMATION    Before you come to the hospital  If you are having outpatient surgery, make arrangements for a responsible adult to drive you home and stay with you for 24 hours following surgery.  Bath/Shower Instructions  Take a bath or shower using the special soap given to you in PAC. Use half the bottle the night before, and the other half the morning of your procedure. Use clean towels with each bath or shower.  Put on clean clothes after bath or shower.  Avoid using lotion and oils.  If you are having surgery above the waist, wear a shirt that fastens up the front.  Sleep on clean sheets if bath or shower is done the night before procedure.  Leave money, credit cards, jewelry, and any other valuables at home. The [facility] is not responsible for the loss or breakage of personal items.  Remove nail polish, makeup and all jewelry (including piercings) before coming to the hospital.  The morning of your procedure:  brush your teeth and tongue  do not smoke  do not shave the area where you will have surgery    What to bring to the hospital  ID/ Insurance Card  Medical Device card  Official documents for legal guardianship   Copy of your Living Will, Advanced Directives, and/or Durable Power of Attorney   Small bag with a few personal belongings  CPAP/BiPAP machine (including all supplies)  Walker,cane, or motorized scooter  Cases for glasses/hearing aids/contact lens (bring solutions for contacts)  Dress in clean, loose, comfortable clothing     Eating or drinking before surgery  Do not eat or drink anything after 11:00 p.m. the day before your procedure (including gum, mints, candy, or chewing tobacco) OR follow the specific instructions you were given by your Surgeon.  You may have WATER ONLY up to 2 hours before arriving at the hospital.     Other instructions  Notify your surgeon if:  there is a possibility that you are pregnant  you become ill with a cough, fever, sore throat, nausea, vomiting or flu-like symptoms  you have any open wounds/sores that are red, painful, draining, or are new since you last saw  the doctor  you need to cancel your procedure  You will receive a call with your surgery arrival time from between 2:30pm and 4:30pm the last business day before your procedure.  If you do not receive a call, please call [phone] before 4:30pm or [phone] after 4:30pm.    Notify us at [facility]: [phone]  if you need to cancel your procedure  if you are going to be late    Arrival at the hospital    [facility]  [address]  [place], [place] [address]    Park in the [employer], located directly across from the main entrance to the hospital.  Judee Clara parking is available  from 7 AM to 4 PM Monday through Friday.  Enter through the ground floor main hospital entrance and check in at the Information Desk in the lobby.  They will validate your parking ticket and direct you to the next location.  If you are a woman between the ages of 81 and 20, and have not had a hysterectomy, you will be asked for a urine sample prior to surgery.  Please do not urinate before arriving in the Surgery Waiting Room.  Once there, check in and let the attendant know if you  need to provide a sample.   For the safety of all patients, visitors and staff as we work to contain COVID-19, we must restrict patient visitors.    Current Visitor Policy (05/07/20):    Our current, and ongoing, visitor rules in surgery and procedural areas are:    1 visitor per patient will be allowed to accompany the patient and wait in the Waiting Room  No visitors will be allowed into the pre/post areas     Patients in inpatient and pediatric units, Emergency Department, ambulatory clinics and lab appointments may only have two visitors.     For inpatient stays, patients may have 2 visitors at a time at their bedside. The two visitors can change throughout the day, but no more than two at a time may be bedside.  The policy applies to [facility]?s [place], [address], [employer] and [place] campuses and clinics.    Exceptions include:  Visitors will no longer be restricted from patients with active COVID-19 infections but should continue to follow our visitation guidelines including screening and masking.  Children younger than age 2 are once again allowed to visit inpatients.  Parents or guardians may bring their children to their clinic appointments or bring siblings to their children's clinic appointment.  One visitor allowed during a patient?s cancer exam appointment; however, no visitors allowed during their cancer treatment/infusion appointment; check with staff for exceptions.  One visitor allowed in perioperative and procedural waiting rooms; no visitors allowed in pre/post areas unless a patient becomes an overnight boarder.  Two parents/guardians are allowed for surgical or procedural patients younger than 45 years old.  Adult inpatients in semiprivate rooms may have visitors, but visits should be coordinated so only two total visitors are in a room at a time due to space limitations.    Visitors must be free of fever and symptoms to be in our facilities. We ask visitors to follow these guidelines:  Wear a mask at all times, unless under the age of 2, have trouble breathing or are unconscious, incapacitated or otherwise unable to remove the cover without assistance.  Go directly to the nursing station in the unit you are visiting and do not linger in public areas.  Check in at the nursing station before going to the patient's room.  Maintain a physical distance of six feet from all others.  Follow elevator restrictions to four riding at a time - peak times are 6:30-7:30 a.m., noon and 6:30-7:30 p.m.  Be aware cafeteria peak times are 11 a.m. - 1 p.m.  Wash your hands frequently and cover your coughs and sneezes.

## 2020-06-18 ENCOUNTER — Encounter: Admit: 2020-06-18 | Discharge: 2020-06-18 | Payer: BC Managed Care – PPO

## 2020-06-18 MED ORDER — ESCITALOPRAM OXALATE 10 MG PO TAB
ORAL_TABLET | Freq: Every day | 1 refills | Status: AC
Start: 2020-06-18 — End: ?

## 2020-07-02 ENCOUNTER — Inpatient Hospital Stay: Admit: 2020-07-02 | Discharge: 2020-07-02 | Payer: BC Managed Care – PPO

## 2020-07-02 ENCOUNTER — Encounter: Admit: 2020-07-02 | Discharge: 2020-07-02 | Payer: BC Managed Care – PPO

## 2020-07-02 ENCOUNTER — Inpatient Hospital Stay: Admit: 2020-07-02 | Discharge: 2020-07-01 | Payer: BC Managed Care – PPO

## 2020-07-02 MED ORDER — ROCURONIUM 10 MG/ML IV SOLN
INTRAVENOUS | 0 refills | Status: DC
Start: 2020-07-02 — End: 2020-07-03
  Administered 2020-07-02 (×2): 30 mg via INTRAVENOUS
  Administered 2020-07-02: 15:00:00 20 mg via INTRAVENOUS
  Administered 2020-07-02: 13:00:00 50 mg via INTRAVENOUS
  Administered 2020-07-02: 14:00:00 20 mg via INTRAVENOUS

## 2020-07-02 MED ORDER — DEXMEDETOMIDINE IN 0.9 % NACL 20 MCG/5 ML (4 MCG/ML) IV SYRG
INTRAVENOUS | 0 refills | Status: DC
Start: 2020-07-02 — End: 2020-07-03
  Administered 2020-07-02 (×2): 4 ug via INTRAVENOUS
  Administered 2020-07-02: 18:00:00 8 ug via INTRAVENOUS

## 2020-07-02 MED ORDER — ALBUMIN, HUMAN 5 % 250 ML IV SOLP (AN)(OSM)
INTRAVENOUS | 0 refills | Status: DC
Start: 2020-07-02 — End: 2020-07-03
  Administered 2020-07-02 (×2): via INTRAVENOUS

## 2020-07-02 MED ORDER — LIDOCAINE (PF) 10 MG/ML (1 %) IJ SOLN
SUBCUTANEOUS | 0 refills | Status: CP
Start: 2020-07-02 — End: ?
  Administered 2020-07-02: 13:00:00 5 mL via SUBCUTANEOUS

## 2020-07-02 MED ORDER — DEXAMETHASONE SODIUM PHOSPHATE 10 MG/ML IJ SOLN
0 refills | Status: CP
Start: 2020-07-02 — End: ?
  Administered 2020-07-02: 13:00:00 8 mg

## 2020-07-02 MED ORDER — HYDROMORPHONE (PF) 2 MG/ML IJ SYRG
INTRAVENOUS | 0 refills | Status: DC
Start: 2020-07-02 — End: 2020-07-03
  Administered 2020-07-02 (×3): .5 mg via INTRAVENOUS

## 2020-07-02 MED ORDER — VECURONIUM BROMIDE 10 MG IV SOLR
INTRAVENOUS | 0 refills | Status: DC
Start: 2020-07-02 — End: 2020-07-03
  Administered 2020-07-02: 20:00:00 2 mg via INTRAVENOUS
  Administered 2020-07-02: 19:00:00 4 mg via INTRAVENOUS
  Administered 2020-07-02: 16:00:00 2 mg via INTRAVENOUS
  Administered 2020-07-02 (×2): 3 mg via INTRAVENOUS
  Administered 2020-07-02: 16:00:00 5 mg via INTRAVENOUS
  Administered 2020-07-02 (×3): 2 mg via INTRAVENOUS
  Administered 2020-07-02: 18:00:00 3 mg via INTRAVENOUS
  Administered 2020-07-02: 19:00:00 2 mg via INTRAVENOUS

## 2020-07-02 MED ORDER — FENTANYL CITRATE (PF) 50 MCG/ML IJ SOLN
INTRAVENOUS | 0 refills | Status: DC
Start: 2020-07-02 — End: 2020-07-03
  Administered 2020-07-02 (×3): 50 ug via INTRAVENOUS
  Administered 2020-07-02: 13:00:00 100 ug via INTRAVENOUS

## 2020-07-02 MED ORDER — MIDAZOLAM 1 MG/ML IJ SOLN
INTRAVENOUS | 0 refills | Status: CP
Start: 2020-07-02 — End: ?
  Administered 2020-07-02: 13:00:00 2 mg via INTRAVENOUS

## 2020-07-02 MED ORDER — SUGAMMADEX 100 MG/ML IV SOLN
INTRAVENOUS | 0 refills | Status: DC
Start: 2020-07-02 — End: 2020-07-03
  Administered 2020-07-02: 400 mg via INTRAVENOUS

## 2020-07-02 MED ORDER — PROPOFOL INJ 10 MG/ML IV VIAL
INTRAVENOUS | 0 refills | Status: DC
Start: 2020-07-02 — End: 2020-07-03
  Administered 2020-07-02: 13:00:00 120 mg via INTRAVENOUS
  Administered 2020-07-03: 20 mg via INTRAVENOUS

## 2020-07-02 MED ORDER — LIDOCAINE (PF) 200 MG/10 ML (2 %) IJ SYRG
INTRAVENOUS | 0 refills | Status: DC
Start: 2020-07-02 — End: 2020-07-03
  Administered 2020-07-02 – 2020-07-03 (×2): 100 mg via INTRAVENOUS

## 2020-07-02 MED ORDER — LACTATED RINGERS IV SOLP
INTRAVENOUS | 0 refills | Status: DC
Start: 2020-07-02 — End: 2020-07-03
  Administered 2020-07-02 (×2): via INTRAVENOUS

## 2020-07-02 MED ORDER — BUPIVACAINE HCL 0.25 % (2.5 MG/ML) IJ SOLN
0 refills | Status: CP
Start: 2020-07-02 — End: ?
  Administered 2020-07-02: 13:00:00 60 mL

## 2020-07-02 MED ORDER — CEFAZOLIN 1 GRAM IJ SOLR
INTRAVENOUS | 0 refills | Status: DC
Start: 2020-07-02 — End: 2020-07-03
  Administered 2020-07-02 (×3): 2 g via INTRAVENOUS

## 2020-07-02 MED ORDER — INDOCYANINE GREEN 25 MG IJ SOLR
INTRAVENOUS | 0 refills | Status: DC
Start: 2020-07-02 — End: 2020-07-03
  Administered 2020-07-02: 16:00:00 10 mg via INTRAVENOUS
  Administered 2020-07-02: 16:00:00 7.5 mg via INTRAVENOUS

## 2020-07-02 MED ORDER — ELECTROLYTE-A IV SOLP
INTRAVENOUS | 0 refills | Status: DC
Start: 2020-07-02 — End: 2020-07-03
  Administered 2020-07-02 (×2): via INTRAVENOUS

## 2020-07-02 MED ORDER — ESMOLOL 100 MG/10 ML (10 MG/ML) IV SOLN
INTRAVENOUS | 0 refills | Status: DC
Start: 2020-07-02 — End: 2020-07-03
  Administered 2020-07-03: 20 mg via INTRAVENOUS

## 2020-07-02 MED ORDER — ONDANSETRON HCL (PF) 4 MG/2 ML IJ SOLN
INTRAVENOUS | 0 refills | Status: DC
Start: 2020-07-02 — End: 2020-07-03
  Administered 2020-07-02: 23:00:00 4 mg via INTRAVENOUS

## 2020-07-02 MED ADMIN — LIDOCAINE HCL 4 % (40 MG/ML) MM SOLN [4450]: 10 mL | TOPICAL | @ 14:00:00 | Stop: 2020-07-03 | NDC 00054350547

## 2020-07-02 MED ADMIN — SODIUM CHLORIDE 0.9 % IV SOLP [27838]: 100 mL | @ 14:00:00 | Stop: 2020-07-03 | NDC 00338952572

## 2020-07-02 MED ADMIN — SODIUM CHLORIDE 0.9 % IV SOLP [27838]: 1000.000 mL | INTRAVENOUS | Stop: 2020-07-03 | NDC 00264999900

## 2020-07-02 MED ADMIN — HEPARIN (PORCINE) 1,000 UNIT/ML IJ SOLN [10176]: 100 mL | @ 14:00:00 | Stop: 2020-07-03 | NDC 63323054011

## 2020-07-02 MED ADMIN — SODIUM CHLORIDE 0.9 % IV SOLP [27838]: 1000.000 mL | INTRAVENOUS | @ 20:00:00 | Stop: 2020-07-03 | NDC 00264999900

## 2020-07-02 MED ADMIN — ASPIRIN 300 MG RE SUPP [693]: 150 mg | RECTAL | @ 13:00:00 | Stop: 2020-07-03 | NDC 00574703412

## 2020-07-02 MED ADMIN — SODIUM CHLORIDE 0.9 % IV SOLP [27838]: 1000 mL | INTRAVENOUS | @ 12:00:00 | Stop: 2020-07-02 | NDC 00338004904

## 2020-07-03 MED ADMIN — ACETAMINOPHEN 500 MG PO TAB [102]: 1000 mg | ORAL | @ 19:00:00 | NDC 00904673061

## 2020-07-03 MED ADMIN — ONDANSETRON HCL (PF) 4 MG/2 ML IJ SOLN [136012]: 4 mg | INTRAVENOUS | @ 21:00:00 | NDC 36000001225

## 2020-07-03 MED ADMIN — CEFAZOLIN INJ 1GM IVP [210319]: 2 g | INTRAVENOUS | @ 05:00:00 | Stop: 2020-07-05 | NDC 60505614200

## 2020-07-03 MED ADMIN — LACTATED RINGERS IV SOLP [4318]: 1000.000 mL | INTRAVENOUS | @ 01:00:00 | Stop: 2020-07-05 | NDC 00338011704

## 2020-07-03 MED ADMIN — CEFAZOLIN INJ 1GM IVP [210319]: 2 g | INTRAVENOUS | @ 13:00:00 | Stop: 2020-07-05 | NDC 60505614200

## 2020-07-03 MED ADMIN — ENOXAPARIN 40 MG/0.4 ML SC SYRG [85052]: 40 mg | SUBCUTANEOUS | @ 13:00:00 | NDC 00781324602

## 2020-07-03 MED ADMIN — CEFAZOLIN INJ 1GM IVP [210319]: 2 g | INTRAVENOUS | @ 23:00:00 | Stop: 2020-07-05 | NDC 60505614200

## 2020-07-03 MED ADMIN — LACTATED RINGERS IV SOLP [4318]: 500 mL | INTRAVENOUS | @ 03:00:00 | Stop: 2020-07-03 | NDC 00338011704

## 2020-07-03 MED ADMIN — ASPIRIN 81 MG PO CHEW [680]: 81 mg | ORAL | @ 13:00:00 | NDC 66553000201

## 2020-07-03 MED ADMIN — SENNOSIDES-DOCUSATE SODIUM 8.6-50 MG PO TAB [40926]: 1 | ORAL | @ 01:00:00 | NDC 00536124801

## 2020-07-03 MED ADMIN — LACTATED RINGERS IV SOLP [4318]: 1000.000 mL | INTRAVENOUS | @ 22:00:00 | Stop: 2020-07-05 | NDC 00338011704

## 2020-07-03 MED ADMIN — KETOROLAC 30 MG/ML (1 ML) IJ SOLN [22473]: 15 mg | INTRAVENOUS | @ 01:00:00 | Stop: 2020-07-08 | NDC 72611072201

## 2020-07-03 MED ADMIN — KETOROLAC 30 MG/ML (1 ML) IJ SOLN [22473]: 15 mg | INTRAVENOUS | @ 19:00:00 | Stop: 2020-07-08 | NDC 72611072201

## 2020-07-03 MED ADMIN — OXYCODONE 5 MG PO TAB [10814]: 10 mg | ORAL | @ 01:00:00 | NDC 00904696661

## 2020-07-03 MED ADMIN — OXYCODONE 5 MG PO TAB [10814]: 10 mg | ORAL | @ 05:00:00 | NDC 00904696661

## 2020-07-03 MED ADMIN — ESCITALOPRAM OXALATE 10 MG PO TAB [86689]: 10 mg | ORAL | @ 13:00:00 | NDC 00904642661

## 2020-07-03 MED ADMIN — KETOROLAC 30 MG/ML (1 ML) IJ SOLN [22473]: 15 mg | INTRAVENOUS | @ 13:00:00 | Stop: 2020-07-08 | NDC 72611072201

## 2020-07-03 MED ADMIN — ACETAMINOPHEN 500 MG PO TAB [102]: 1000 mg | ORAL | @ 01:00:00 | NDC 00904673061

## 2020-07-03 MED ADMIN — LACTATED RINGERS IV SOLP [4318]: 1000.000 mL | INTRAVENOUS | @ 07:00:00 | Stop: 2020-07-05 | NDC 00338011704

## 2020-07-03 MED ADMIN — SENNOSIDES-DOCUSATE SODIUM 8.6-50 MG PO TAB [40926]: 1 | ORAL | @ 13:00:00 | NDC 00536124801

## 2020-07-03 MED ADMIN — MONTELUKAST 10 MG PO TAB [81627]: 10 mg | ORAL | @ 01:00:00 | NDC 00904680861

## 2020-07-03 MED ADMIN — ACETAMINOPHEN 500 MG PO TAB [102]: 1000 mg | ORAL | @ 13:00:00 | NDC 00904673061

## 2020-07-03 MED ADMIN — ONDANSETRON HCL (PF) 4 MG/2 ML IJ SOLN [136012]: 4 mg | INTRAVENOUS | @ 13:00:00 | NDC 36000001225

## 2020-07-03 MED ADMIN — POLYETHYLENE GLYCOL 3350 17 GRAM PO PWPK [25424]: 17 g | ORAL | @ 13:00:00 | NDC 00904693186

## 2020-07-03 MED ADMIN — ACETAMINOPHEN 500 MG PO TAB [102]: 1000 mg | ORAL | @ 08:00:00 | NDC 00904673061

## 2020-07-03 MED ADMIN — LACTATED RINGERS IV SOLP [4318]: 1000.000 mL | INTRAVENOUS | @ 16:00:00 | Stop: 2020-07-05 | NDC 00338011704

## 2020-07-04 ENCOUNTER — Encounter: Admit: 2020-07-04 | Discharge: 2020-07-04 | Payer: BC Managed Care – PPO

## 2020-07-04 DIAGNOSIS — Z923 Personal history of irradiation: Secondary | ICD-10-CM

## 2020-07-04 DIAGNOSIS — I10 Essential (primary) hypertension: Secondary | ICD-10-CM

## 2020-07-04 DIAGNOSIS — E8881 Metabolic syndrome: Secondary | ICD-10-CM

## 2020-07-04 DIAGNOSIS — Z789 Other specified health status: Secondary | ICD-10-CM

## 2020-07-04 DIAGNOSIS — E282 Polycystic ovarian syndrome: Secondary | ICD-10-CM

## 2020-07-04 DIAGNOSIS — R12 Heartburn: Secondary | ICD-10-CM

## 2020-07-04 DIAGNOSIS — J302 Other seasonal allergic rhinitis: Secondary | ICD-10-CM

## 2020-07-04 DIAGNOSIS — C50919 Malignant neoplasm of unspecified site of unspecified female breast: Secondary | ICD-10-CM

## 2020-07-04 MED ADMIN — MONTELUKAST 10 MG PO TAB [81627]: 10 mg | ORAL | @ 02:00:00 | NDC 00904680861

## 2020-07-04 MED ADMIN — LACTATED RINGERS IV SOLP [4318]: 1000.000 mL | INTRAVENOUS | @ 05:00:00 | Stop: 2020-07-05 | NDC 00338011704

## 2020-07-04 MED ADMIN — ACETAMINOPHEN 500 MG PO TAB [102]: 1000 mg | ORAL | @ 20:00:00 | NDC 00904673061

## 2020-07-04 MED ADMIN — WATER FOR INJECTION, STERILE IJ SOLN [79513]: 20 mL | INTRAVENOUS | @ 15:00:00 | Stop: 2020-07-04 | NDC 00409488723

## 2020-07-04 MED ADMIN — ENOXAPARIN 40 MG/0.4 ML SC SYRG [85052]: 40 mg | SUBCUTANEOUS | @ 02:00:00 | NDC 00781324602

## 2020-07-04 MED ADMIN — POLYETHYLENE GLYCOL 3350 17 GRAM PO PWPK [25424]: 17 g | ORAL | @ 14:00:00 | NDC 00904693186

## 2020-07-04 MED ADMIN — KETOROLAC 30 MG/ML (1 ML) IJ SOLN [22473]: 15 mg | INTRAVENOUS | @ 14:00:00 | Stop: 2020-07-08 | NDC 72611072201

## 2020-07-04 MED ADMIN — ACETAMINOPHEN 500 MG PO TAB [102]: 1000 mg | ORAL | @ 02:00:00 | NDC 00904673061

## 2020-07-04 MED ADMIN — CEFAZOLIN INJ 1GM IVP [210319]: 2 g | INTRAVENOUS | @ 07:00:00 | Stop: 2020-07-04 | NDC 60505614200

## 2020-07-04 MED ADMIN — ENOXAPARIN 40 MG/0.4 ML SC SYRG [85052]: 40 mg | SUBCUTANEOUS | @ 14:00:00 | NDC 00781324602

## 2020-07-04 MED ADMIN — WATER FOR INJECTION, STERILE IJ SOLN [79513]: 20 mL | INTRAVENOUS | @ 23:00:00 | Stop: 2020-07-04 | NDC 00409488723

## 2020-07-04 MED ADMIN — KETOROLAC 30 MG/ML (1 ML) IJ SOLN [22473]: 15 mg | INTRAVENOUS | @ 07:00:00 | Stop: 2020-07-08 | NDC 72611072201

## 2020-07-04 MED ADMIN — LACTATED RINGERS IV SOLP [4318]: 1000.000 mL | INTRAVENOUS | @ 18:00:00 | Stop: 2020-07-04 | NDC 00338011704

## 2020-07-04 MED ADMIN — ASPIRIN 81 MG PO CHEW [680]: 81 mg | ORAL | @ 14:00:00 | NDC 66553000201

## 2020-07-04 MED ADMIN — CEFAZOLIN INJ 1GM IVP [210319]: 2 g | INTRAVENOUS | @ 23:00:00 | Stop: 2020-07-04 | NDC 60505614200

## 2020-07-04 MED ADMIN — SENNOSIDES-DOCUSATE SODIUM 8.6-50 MG PO TAB [40926]: 1 | ORAL | @ 02:00:00 | NDC 00536124801

## 2020-07-04 MED ADMIN — LACTATED RINGERS IV SOLP [4318]: 1000.000 mL | INTRAVENOUS | @ 11:00:00 | Stop: 2020-07-04 | NDC 00338011704

## 2020-07-04 MED ADMIN — OXYCODONE 5 MG PO TAB [10814]: 10 mg | ORAL | @ 11:00:00 | NDC 00904696661

## 2020-07-04 MED ADMIN — POTASSIUM CHLORIDE 20 MEQ PO TBTQ [35943]: 40 meq | ORAL | @ 17:00:00 | Stop: 2020-07-04 | NDC 00245531989

## 2020-07-04 MED ADMIN — ACETAMINOPHEN 500 MG PO TAB [102]: 1000 mg | ORAL | @ 14:00:00 | NDC 00904673061

## 2020-07-04 MED ADMIN — ESCITALOPRAM OXALATE 10 MG PO TAB [86689]: 10 mg | ORAL | @ 14:00:00 | NDC 00904642661

## 2020-07-04 MED ADMIN — KETOROLAC 30 MG/ML (1 ML) IJ SOLN [22473]: 15 mg | INTRAVENOUS | @ 02:00:00 | Stop: 2020-07-08 | NDC 72611072201

## 2020-07-04 MED ADMIN — OXYCODONE 5 MG PO TAB [10814]: 5 mg | ORAL | @ 04:00:00 | NDC 00904696661

## 2020-07-04 MED ADMIN — SENNOSIDES-DOCUSATE SODIUM 8.6-50 MG PO TAB [40926]: 1 | ORAL | @ 14:00:00 | NDC 00536124801

## 2020-07-04 MED ADMIN — ACETAMINOPHEN 500 MG PO TAB [102]: 1000 mg | ORAL | @ 09:00:00 | NDC 00904673061

## 2020-07-04 MED ADMIN — ONDANSETRON HCL (PF) 4 MG/2 ML IJ SOLN [136012]: 4 mg | INTRAVENOUS | @ 13:00:00 | NDC 36000001225

## 2020-07-04 MED ADMIN — CEFAZOLIN INJ 1GM IVP [210319]: 2 g | INTRAVENOUS | @ 15:00:00 | Stop: 2020-07-04 | NDC 60505614200

## 2020-07-04 MED ADMIN — KETOROLAC 30 MG/ML (1 ML) IJ SOLN [22473]: 15 mg | INTRAVENOUS | @ 20:00:00 | Stop: 2020-07-08 | NDC 72611072201

## 2020-07-05 ENCOUNTER — Encounter: Admit: 2020-07-05 | Discharge: 2020-07-05 | Payer: BC Managed Care – PPO

## 2020-07-05 MED ADMIN — POLYETHYLENE GLYCOL 3350 17 GRAM PO PWPK [25424]: 17 g | ORAL | @ 13:00:00 | Stop: 2020-07-05 | NDC 00904693186

## 2020-07-05 MED ADMIN — MONTELUKAST 10 MG PO TAB [81627]: 10 mg | ORAL | @ 02:00:00 | NDC 00904680861

## 2020-07-05 MED ADMIN — SENNOSIDES-DOCUSATE SODIUM 8.6-50 MG PO TAB [40926]: 1 | ORAL | @ 02:00:00 | NDC 00536124801

## 2020-07-05 MED ADMIN — LACTULOSE 10 GRAM/15 ML PO LIQUID GROUP [280001]: 20 g | ORAL | @ 14:00:00 | Stop: 2020-07-05 | NDC 00121115430

## 2020-07-05 MED ADMIN — ONDANSETRON HCL (PF) 4 MG/2 ML IJ SOLN [136012]: 4 mg | INTRAVENOUS | @ 02:00:00 | NDC 36000001225

## 2020-07-05 MED ADMIN — ENOXAPARIN 40 MG/0.4 ML SC SYRG [85052]: 40 mg | SUBCUTANEOUS | @ 13:00:00 | Stop: 2020-07-05 | NDC 00781324602

## 2020-07-05 MED ADMIN — ASPIRIN 81 MG PO CHEW [680]: 81 mg | ORAL | @ 13:00:00 | Stop: 2020-07-05 | NDC 66553000201

## 2020-07-05 MED ADMIN — SENNOSIDES-DOCUSATE SODIUM 8.6-50 MG PO TAB [40926]: 1 | ORAL | @ 13:00:00 | Stop: 2020-07-05 | NDC 00536124801

## 2020-07-05 MED ADMIN — KETOROLAC 30 MG/ML (1 ML) IJ SOLN [22473]: 15 mg | INTRAVENOUS | @ 09:00:00 | Stop: 2020-07-05 | NDC 72611072201

## 2020-07-05 MED ADMIN — KETOROLAC 30 MG/ML (1 ML) IJ SOLN [22473]: 15 mg | INTRAVENOUS | @ 14:00:00 | Stop: 2020-07-05 | NDC 72611072201

## 2020-07-05 MED ADMIN — ACETAMINOPHEN 500 MG PO TAB [102]: 1000 mg | ORAL | @ 09:00:00 | Stop: 2020-07-05 | NDC 00904673061

## 2020-07-05 MED ADMIN — ACETAMINOPHEN 500 MG PO TAB [102]: 1000 mg | ORAL | @ 14:00:00 | Stop: 2020-07-05 | NDC 00904673061

## 2020-07-05 MED ADMIN — ENOXAPARIN 40 MG/0.4 ML SC SYRG [85052]: 40 mg | SUBCUTANEOUS | @ 02:00:00 | NDC 00781324602

## 2020-07-05 MED ADMIN — KETOROLAC 30 MG/ML (1 ML) IJ SOLN [22473]: 15 mg | INTRAVENOUS | @ 02:00:00 | Stop: 2020-07-08 | NDC 72611072201

## 2020-07-05 MED ADMIN — ACETAMINOPHEN 500 MG PO TAB [102]: 1000 mg | ORAL | @ 02:00:00 | NDC 00904673061

## 2020-07-05 MED ADMIN — ESCITALOPRAM OXALATE 10 MG PO TAB [86689]: 10 mg | ORAL | @ 13:00:00 | Stop: 2020-07-05 | NDC 00904642661

## 2020-07-05 MED FILL — OXYCODONE 5 MG PO TAB: 5 mg | ORAL | 3 days supply | Qty: 30 | Fill #1 | Status: CP

## 2020-07-05 MED FILL — ASPIRIN 81 MG PO CHEW: 81 mg | ORAL | 30 days supply | Qty: 30 | Fill #1 | Status: CP

## 2020-07-05 MED FILL — DOXYCYCLINE MONOHYDRATE 100 MG PO TAB: 100 mg | ORAL | 14 days supply | Qty: 28 | Fill #1 | Status: CP

## 2020-07-05 MED FILL — ONDANSETRON HCL 4 MG PO TAB: 4 mg | ORAL | 4 days supply | Qty: 10 | Fill #1 | Status: CP

## 2020-07-05 MED FILL — PROCHLORPERAZINE MALEATE 10 MG PO TAB: 10 mg | ORAL | 3 days supply | Qty: 10 | Fill #1 | Status: CP

## 2020-07-10 ENCOUNTER — Encounter: Admit: 2020-07-10 | Discharge: 2020-07-10 | Payer: BC Managed Care – PPO

## 2020-07-10 ENCOUNTER — Ambulatory Visit: Admit: 2020-07-10 | Discharge: 2020-07-11 | Payer: BC Managed Care – PPO

## 2020-07-10 DIAGNOSIS — C50919 Malignant neoplasm of unspecified site of unspecified female breast: Secondary | ICD-10-CM

## 2020-07-10 DIAGNOSIS — E8881 Metabolic syndrome: Secondary | ICD-10-CM

## 2020-07-10 DIAGNOSIS — I10 Essential (primary) hypertension: Secondary | ICD-10-CM

## 2020-07-10 DIAGNOSIS — Z789 Other specified health status: Secondary | ICD-10-CM

## 2020-07-10 DIAGNOSIS — Z9889 Other specified postprocedural states: Secondary | ICD-10-CM

## 2020-07-10 DIAGNOSIS — Z923 Personal history of irradiation: Secondary | ICD-10-CM

## 2020-07-10 DIAGNOSIS — J302 Other seasonal allergic rhinitis: Secondary | ICD-10-CM

## 2020-07-10 DIAGNOSIS — R12 Heartburn: Secondary | ICD-10-CM

## 2020-07-10 DIAGNOSIS — C50411 Malignant neoplasm of upper-outer quadrant of right female breast: Secondary | ICD-10-CM

## 2020-07-10 DIAGNOSIS — E282 Polycystic ovarian syndrome: Secondary | ICD-10-CM

## 2020-07-10 NOTE — Progress Notes
LYMPHEDEMA DATASHEET-FOLLOW-UP    DATE:  07/10/2020    PATIENT NAME:  Jennifer Durham  DATE OF BIRTH:  [birth year]  MRN:   [id number]  Follow-Up Visit:  4 week return for right arm lymphedema  Surgical History: 08/21/19: Bilateral skin sparing mastectomy, Right SLN to ALND (1/34), reconstruction (TE) with Dr. Vear Clock Dr. Fran Lowes  Lymphedema Diagnosis?:  Yes: Stage 1 Stable with interventions.  LYMPHEDEMA INSTRUMENT:   PATIENT POPULATION:    Diagnosed with and treated for cancer at [facility]?  Yes    Referred to [facility] after outside cancer treatment?  No    Referred to [facility] for lymphedema management?  No     DIAGNOSTIC DEFINITION OF LYMPHEDEMA:     > 2cm change from baseline volume     IMPACT OF LE ON ADL:   CHANGES IN BMI (UPON LE DIAGNOSIS):  Stable    LYMPHEDEMA RISK FACTORS(CHECK ALL THAT APPLY):  Axillary Node Dissection, Skin-sparing Mastecomy, SLN Mapping, Reconstruction  and Bilateral Mastectomy   RECENT INFECTIONS?:  Yes   TYPE OF INFECTION(S):  Bacterial - cellulitis 04/30/20 - treated with Bactrim by PCP   CHANGE IN PRESCRIBED MEDICATION?:  No   USE OF COMPRESSION SLEEVE:  Unilateral    Bioimpedance Analysis  Current: Not completed due to recent DIEP surgery on 07/02/20.  Baseline: 2.4  Change from Baseline: N/A    Handedness:  right handed    Circumferential Measurements   RUE/LUE   Hand:  22.2 / 20.5    Wrist:  19.5 / 18.6     8 cm:  24.6 / 23.6   16 cm:  31.3 / 30.3   Elbow cm:  35.3 / 32.9   8 cm:  39.5 / 36.4   16 cm:  43.0 / 40.4   24 cm:  47.3 / 42.5     BMI: 40.04 obtained 6/16     Compression Garment Sizes:  Sleeve: Did not bring sleeve to visit to obtain size.     Radiation Therapy: completed 03/12/19  Adjuvant Chemotherapy: TC 09/26/19- 11/28/19     Jennifer Durham presents to clinic for her 5 week return for right arm lymphedema. Appointment coordinated with her Plastics post op visit with Marlana Salvage, NP.  Reviewed with patient any recent changes in health history, infections, medications, or general concerns as it relates to lymphedema.  Reinforced and verified adherence to precautions from initial visit. She first noticed swelling to her right arm and hand during adjuvant chemotherapy. She states the swelling gradually increased from initial onset through her last cycle of TC. After completion of chemotherapy the swelling has remained constant and unchanged. She has been following up with our clinic since 01/07/20. She was diagnosed with cellulitis on 04/30/20 and her PCP started her on Bactrim. The plan after her last visit on 06/05/20 was to wear her compression sleeve 8 hours daily and perform MLD and stretches BID until her DIEP reconstruction with Dr. Fran Lowes on 6/8. After her surgery the plan was for her to continue to wear her sleeve for 8 hours daily. She should perform passive stretches as directed by Dr. Tama Gander team. Jennifer Durham reported that she has been wearing her sleeve for 8 hours daily and performing the stretches and massage as much as she is able to. Jennifer Durham's right arm is visibly slightly larger than the left. No areas of increased tissue density noted. She denies experiencing aching, heaviness, or fullness in the right arm but reports right shoulder discomfort. She isn't sure if the  discomfort is related to swelling or positioning from the surgery 8 days ago.     Assessment:  BIS: Not obtained today due to being 8 days post DIEP reconstruction.  Right arm:  Patient denies feelings of aching, heaviness and fullness in right arm. She reports right shoulder discomfort. She isn't sure if it is related to her recent surgery on 6/8   Bilateral upper extremity skin is pink, soft and intact. No rash, erythema or ulcerations noted.   Edema: present - right arm visibly slightly fuller than her left.  No pitting, stemmer's, or fibrosis noted.  Skin pliable  Normal visualization of bony prominences and vasculature on left and when compared to right. Area of slight fullness noted at base of right thumb.   ROM: limited range of motion due to recent DIEP reconstruction.   Strength: Normal and equal bilaterally.    Discussed assessment with patient. Reviewed circumferential arm measurements with areas of greater than 2 cm difference between arms.  Early lymphedema signs and symptoms, potential aggravating factors, lymphatic drainage pathways, treatment interventions, and risks of not treating discussed.  Jennifer Durham did not have any questions or concerns regarding her interventions. Discussed the importance of protecting her skin especially from the sun. Verified patient is aware of worsening signs and symptoms to watch for as well as when to contact the clinic. Instructed Jennifer Durham to continue with stretches that are approved by Dr. Tama Gander team.     Lymphedema Stage:  Stage 1     Recommendations:  Wear compression sleeve daily while awake. Wear compression gauntlet during the night if needed. Light self-massage right arm and passive stretching daily. Continue with precautions, meticulous skin care, weight management as well as active healthy lifestyle.  Verified no referrals indicated at this time for any issues or concerns.    Plan:  Return to clinic in coordination with another provider visit in 4 weeks due to living in [place], [place]. Patient will return for a Plastics post op next week on 6/23. Will reassess her upcoming visits to coordinate our next follow up. Patient agreeable with recommendations and plan. Verified patient has contact information should any questions or concerns arise.      06/05/20:      07/10/20:

## 2020-07-10 NOTE — Progress Notes
Subjective:       History of Present Illness  Jennifer Durham is a 45 y.o. female.  Cancer Staging  Malignant neoplasm of upper-outer quadrant of right breast in female, estrogen receptor positive (HCC)  Staging form: Breast, AJCC 8th Edition  - Clinical stage from 07/04/2019: Stage IA (cT1c, cN0(f), cM0, G2, ER+, PR+, HER2-) - Signed by Guy Begin, PA-C on 07/12/2019  - Pathologic stage from 08/21/2019: Stage IA (pT1c, pN1a, cM0, G2, ER+, PR+, HER2-) - Signed by Guy Begin, PA-C on 08/30/2019    Patient returns to clinic postoperatively. She is overall doing well with the exception of post op nausea and occasional lightheadedness. All JP drains have an output greater than 30cc daily. Wound vac has been functioning properly.   Allergies   Allergen Reactions   ? Tegaderm RASH        Review of Systems   Constitutional: Negative.    HENT: Negative.    Eyes: Negative.    Respiratory: Negative.    Cardiovascular: Negative.    Gastrointestinal: Positive for nausea.   Endocrine: Negative.    Genitourinary: Negative.    Musculoskeletal: Negative.    Skin: Negative.    Allergic/Immunologic: Negative.    Neurological: Negative.    Hematological: Negative.    Psychiatric/Behavioral: Negative.      Medical History:   Diagnosis Date   ? Breast cancer (HCC) 07/04/2019    right   ? Heartburn    ? Hx of radiation therapy 2022   ? Hypertension    ? Insulin resistance    ? Limb alert care status     No venipuncture/BP's to RUE   ? PCOS (polycystic ovarian syndrome)    ? Seasonal allergies     Frequent sinusitis     Surgical History:   Procedure Laterality Date   ? HX TONSILLECTOMY  2001    turbinates/adenoids 2015   ? HX TONSIL AND ADENOIDECTOMY  2011   ? LEFT PERONEAL TENDON REPAIR Left 12/15/2016    Performed by Vopat, Lowry Ram, MD at [facility] OR   ? Left Skin Sparing Mastectomy Left 08/21/2019    Performed by Renford Dills, MD at [facility] OR   ? IDENTIFICATION SENTINEL LYMPH NODE Right 08/21/2019    Performed by Renford Dills, MD at [facility] OR   ? INJECTION RADIOACTIVE TRACER FOR SENTINEL NODE IDENTIFICATION Right 08/21/2019    Performed by Renford Dills, MD at [facility] OR   ? RADIOLOGICAL EXAM SURGICAL SPECIMEN Right 08/21/2019    Performed by Renford Dills, MD at [facility] OR   ? MASTECTOMY - MODIFIED RADICAL WITH AXILLARY LYMPH NODE DISSECTION Right 08/21/2019    Performed by Renford Dills, MD at [facility] OR   ? RECONSTRUCTION BREAST WITH TISSUE EXPANDER AND SUBSEQUENT EXPANSION - IMMEDIATE/ DELAYED Bilateral 08/21/2019    Performed by Marlinda Mike, MD at [facility] OR   ? IMPLANTATION BIOLOGIC IMPLANT FOR SOFT TISSUE REINFORCEMENT Bilateral 08/21/2019    Performed by Marlinda Mike, MD at [facility] OR   ? RECONSTRUCTION BREAST WITH DEEP INFERIOR EPIGASTRIC PERFORATOR FLAP/ SUPERFICIAL INFERIOR EPIGASTRIC ARTERY FLAP Bilateral 07/02/2020    Performed by Marlinda Mike, MD at [facility] OR   ? RECONSTRUCTION BREAST WITH FREE FLAP - request 22 modifier, complicated procedure due to obesity and intramuscular course Bilateral 07/02/2020    Performed by Marlinda Mike, MD at [facility] OR   ? INTRAVENOUS INJECTION AGENT TO TEST VASCULAR FLOW IN FLAP/ GRAFT Bilateral  07/02/2020    Performed by Marlinda Mike, MD at [facility] OR   ? APPLICATION NEGATIVE PRESSURE WOUND THERAPY Bilateral 07/02/2020    Performed by Marlinda Mike, MD at [facility] OR   ? IMPLANTATION MESH CLOSURE OF DEBRIDEMENT FOR NECROTIZING SOFT TISSUE INFECTION Bilateral 07/02/2020    Performed by Marlinda Mike, MD at [facility] OR   ? REMOVAL TISSUE EXPANDER Bilateral 07/02/2020    Performed by Marlinda Mike, MD at [facility] OR     Family History   Problem Relation Age of Onset   ? Heart Attack Mother    ? Cancer Mother    ? Diabetes Mother    ? Cancer-Breast Mother 31   ? Heart Disease Mother    ? Hypertension Mother    ? Heart Disease Father    ? Hypothyroid Father    ? Hypertension Father    ? Thyroid Disease Father    ? Hypertension Brother    ? Cancer-Breast Maternal Aunt 50   ? Cancer-Ovarian Maternal Aunt 50   ? Diabetes Maternal Aunt ? Arthritis-rheumatoid Maternal Aunt    ? Diabetes Maternal Grandmother    ? Heart Disease Maternal Grandfather      Social History     Socioeconomic History   ? Marital status: Married   Tobacco Use   ? Smoking status: Never Smoker   ? Smokeless tobacco: Never Used   Vaping Use   ? Vaping Use: Never used   Substance and Sexual Activity   ? Alcohol use: Yes     Alcohol/week: 1.0 standard drink     Types: 1 Shots of liquor per week     Comment: seldom   ? Drug use: No   ? Sexual activity: Yes     Partners: Male     Birth control/protection: I.U.D.     Vaping/E-liquid Use   ? Vaping Use Never User                   Objective:         ? aspirin 81 mg chewable tablet Chew one tablet by mouth daily. Take with food.   ? DIPH/LIDO/ANTACID 1:1:1 (COMPOUND) Swish and Swallow 15 mL by mouth as directed before meals and at bedtime.   ? doxycycline monohydrate (ADOXA) 100 mg tablet Take one tablet by mouth twice daily for 14 days.   ? escitalopram oxalate (LEXAPRO) 10 mg tablet TAKE ONE TABLET BY MOUTH EVERY DAY   ? metFORMIN (GLUCOPHAGE) 1,000 mg tablet Take 1,000 mg by mouth twice daily with meals.   ? montelukast (SINGULAIR) 10 mg tablet Take 10 mg by mouth at bedtime daily.   ? ondansetron HCL (ZOFRAN) 4 mg tablet Take one tablet by mouth every 8 hours as needed for Nausea or Vomiting.   ? oxyCODONE (ROXICODONE) 5 mg tablet Take one tablet to two tablets by mouth every 4 hours as needed.   ? prochlorperazine maleate (COMPAZINE) 10 mg tablet Take one tablet by mouth every 6 hours as needed.   ? silver sulfADIAZINE (SSD) 1 % topical cream Apply to affected skin BID.   ? tamoxifen (NOLVADEX) 20 mg tablet Take one tablet by mouth daily. (Patient taking differently: Take 20 mg by mouth at bedtime daily.)   ? triamcinolone acetonide (TRIDERM) 0.1 % topical cream Apply  topically to affected area twice daily as needed. Use up to 2 weeks per month. Do not use on face/armpits/groin/normal skin.   ? triamterene-hydrochlorothiazide (MAXZIDE) 75-50 mg  tablet Take 1 tablet by mouth daily.     Vitals:    07/10/20 1128 07/10/20 1134   BP: 93/68 108/79   BP Source: Arm, Left Upper Arm, Left Lower   Pulse: 120 112   Temp: 36.3 ?C (97.4 ?F)    TempSrc: Skin    PainSc: Four    Weight: 133.9 kg (295 lb 3.2 oz)    Height: 182.9 cm (6')      Body mass index is 40.04 kg/m?Marland Kitchen     Physical Exam  Vitals reviewed.   Constitutional:       Appearance: Normal appearance. She is well-groomed.   HENT:      Head: Normocephalic.   Pulmonary:      Effort: Pulmonary effort is normal.   Chest:      Comments: Bilateral breast DIEP healing appropriately.   Abdominal:      Palpations: Abdomen is soft.      Comments: abdominal incision and umbilicus healing appropriately. Some skin loss of superior umbilicus.    Skin:     General: Skin is warm and dry.      Findings: Bruising present.   Neurological:      Mental Status: She is alert.   Psychiatric:         Mood and Affect: Mood normal.         Behavior: Behavior normal. Behavior is cooperative.         Thought Content: Thought content normal.         Judgment: Judgment normal.          Assessment and Plan:  Patient s/p bilateral DIEP breast reconstruction  All JP drains redressed with CHG tegaderm. Wound vac removed without issue. No longer needs vac and it may be returned.    RTC next week.

## 2020-07-17 ENCOUNTER — Ambulatory Visit: Admit: 2020-07-17 | Discharge: 2020-07-18 | Payer: BC Managed Care – PPO

## 2020-07-17 ENCOUNTER — Encounter: Admit: 2020-07-17 | Discharge: 2020-07-17 | Payer: BC Managed Care – PPO

## 2020-07-17 DIAGNOSIS — E282 Polycystic ovarian syndrome: Secondary | ICD-10-CM

## 2020-07-17 DIAGNOSIS — J302 Other seasonal allergic rhinitis: Secondary | ICD-10-CM

## 2020-07-17 DIAGNOSIS — C50411 Malignant neoplasm of upper-outer quadrant of right female breast: Secondary | ICD-10-CM

## 2020-07-17 DIAGNOSIS — R12 Heartburn: Secondary | ICD-10-CM

## 2020-07-17 DIAGNOSIS — Z789 Other specified health status: Secondary | ICD-10-CM

## 2020-07-17 DIAGNOSIS — Z923 Personal history of irradiation: Secondary | ICD-10-CM

## 2020-07-17 DIAGNOSIS — E8881 Metabolic syndrome: Secondary | ICD-10-CM

## 2020-07-17 DIAGNOSIS — Z9889 Other specified postprocedural states: Secondary | ICD-10-CM

## 2020-07-17 DIAGNOSIS — C50919 Malignant neoplasm of unspecified site of unspecified female breast: Secondary | ICD-10-CM

## 2020-07-17 DIAGNOSIS — I10 Essential (primary) hypertension: Secondary | ICD-10-CM

## 2020-07-17 NOTE — Progress Notes
Subjective:       History of Present Illness  Jennifer Durham is a 45 y.o. female.  Cancer Staging  Malignant neoplasm of upper-outer quadrant of right breast in female, estrogen receptor positive (HCC)  Staging form: Breast, AJCC 8th Edition  - Clinical stage from 07/04/2019: Stage IA (cT1c, cN0(f), cM0, G2, ER+, PR+, HER2-) - Signed by Guy Begin, PA-C on 07/12/2019  - Pathologic stage from 08/21/2019: Stage IA (pT1c, pN1a, cM0, G2, ER+, PR+, HER2-) - Signed by Guy Begin, PA-C on 08/30/2019    Patient returns to clinic postoperatively. She is overall doing well however, she continues to have nausea postoperatively. She notes she continues her doxycycline. Her breast JP drains are ready for removal with an output less than 30cc daily.   Allergies   Allergen Reactions   ? Tegaderm RASH        Review of Systems   Constitutional: Negative for diaphoresis.        Hot flashes     HENT: Negative.    Eyes: Negative.    Respiratory: Negative.    Cardiovascular: Negative.    Gastrointestinal: Positive for nausea.   Endocrine: Negative.    Genitourinary: Negative.    Musculoskeletal: Negative.    Skin: Negative.    Allergic/Immunologic: Negative.    Neurological: Negative.    Hematological: Negative.    Psychiatric/Behavioral: Negative.      Medical History:   Diagnosis Date   ? Breast cancer (HCC) 07/04/2019    right   ? Heartburn    ? Hx of radiation therapy 2022   ? Hypertension    ? Insulin resistance    ? Limb alert care status     No venipuncture/BP's to RUE   ? PCOS (polycystic ovarian syndrome)    ? Seasonal allergies     Frequent sinusitis     Surgical History:   Procedure Laterality Date   ? HX TONSILLECTOMY  2001    turbinates/adenoids 2015   ? HX TONSIL AND ADENOIDECTOMY  2011   ? LEFT PERONEAL TENDON REPAIR Left 12/15/2016    Performed by Vopat, Lowry Ram, MD at [facility]   ? Left Skin Sparing Mastectomy Left 08/21/2019    Performed by Renford Dills, MD at [facility]   ? IDENTIFICATION SENTINEL LYMPH NODE Right 08/21/2019    Performed by Renford Dills, MD at [facility]   ? INJECTION RADIOACTIVE TRACER FOR SENTINEL NODE IDENTIFICATION Right 08/21/2019    Performed by Renford Dills, MD at [facility]   ? RADIOLOGICAL EXAM SURGICAL SPECIMEN Right 08/21/2019    Performed by Renford Dills, MD at [facility]   ? MASTECTOMY - MODIFIED RADICAL WITH AXILLARY LYMPH NODE DISSECTION Right 08/21/2019    Performed by Renford Dills, MD at [facility]   ? RECONSTRUCTION BREAST WITH TISSUE EXPANDER AND SUBSEQUENT EXPANSION - IMMEDIATE/ DELAYED Bilateral 08/21/2019    Performed by Marlinda Mike, MD at [facility]   ? IMPLANTATION BIOLOGIC IMPLANT FOR SOFT TISSUE REINFORCEMENT Bilateral 08/21/2019    Performed by Marlinda Mike, MD at [facility]   ? RECONSTRUCTION BREAST WITH DEEP INFERIOR EPIGASTRIC PERFORATOR FLAP/ SUPERFICIAL INFERIOR EPIGASTRIC ARTERY FLAP Bilateral 07/02/2020    Performed by Marlinda Mike, MD at [facility] OR   ? RECONSTRUCTION BREAST WITH FREE FLAP - request 22 modifier, complicated procedure due to obesity and intramuscular course Bilateral 07/02/2020    Performed by Marlinda Mike, MD at [facility] OR   ?  INTRAVENOUS INJECTION AGENT TO TEST VASCULAR FLOW IN FLAP/ GRAFT Bilateral 07/02/2020    Performed by Marlinda Mike, MD at [facility] OR   ? APPLICATION NEGATIVE PRESSURE WOUND THERAPY Bilateral 07/02/2020    Performed by Marlinda Mike, MD at [facility] OR   ? IMPLANTATION MESH CLOSURE OF DEBRIDEMENT FOR NECROTIZING SOFT TISSUE INFECTION Bilateral 07/02/2020    Performed by Marlinda Mike, MD at [facility] OR   ? REMOVAL TISSUE EXPANDER Bilateral 07/02/2020    Performed by Marlinda Mike, MD at [facility] OR     Family History   Problem Relation Age of Onset   ? Heart Attack Mother    ? Cancer Mother    ? Diabetes Mother    ? Cancer-Breast Mother 90   ? Heart Disease Mother    ? Hypertension Mother    ? Heart Disease Father    ? Hypothyroid Father    ? Hypertension Father    ? Thyroid Disease Father    ? Hypertension Brother    ? Cancer-Breast Maternal Aunt 50   ? Cancer-Ovarian Maternal Aunt 50   ? Diabetes Maternal Aunt    ? Arthritis-rheumatoid Maternal Aunt    ? Diabetes Maternal Grandmother    ? Heart Disease Maternal Grandfather      Social History     Socioeconomic History   ? Marital status: Married   Tobacco Use   ? Smoking status: Never Smoker   ? Smokeless tobacco: Never Used   Vaping Use   ? Vaping Use: Never used   Substance and Sexual Activity   ? Alcohol use: Yes     Alcohol/week: 1.0 standard drink     Types: 1 Shots of liquor per week     Comment: seldom   ? Drug use: No   ? Sexual activity: Yes     Partners: Male     Birth control/protection: I.U.D.     Vaping/E-liquid Use   ? Vaping Use Never User                   Objective:         ? aspirin 81 mg chewable tablet Chew one tablet by mouth daily. Take with food.   ? doxycycline monohydrate (ADOXA) 100 mg tablet Take one tablet by mouth twice daily for 14 days.   ? escitalopram oxalate (LEXAPRO) 10 mg tablet TAKE ONE TABLET BY MOUTH EVERY DAY   ? metFORMIN (GLUCOPHAGE) 1,000 mg tablet Take 1,000 mg by mouth twice daily with meals.   ? montelukast (SINGULAIR) 10 mg tablet Take 10 mg by mouth at bedtime daily.   ? ondansetron HCL (ZOFRAN) 4 mg tablet Take one tablet by mouth every 8 hours as needed for Nausea or Vomiting.   ? oxyCODONE (ROXICODONE) 5 mg tablet Take one tablet to two tablets by mouth every 4 hours as needed.   ? prochlorperazine maleate (COMPAZINE) 10 mg tablet Take one tablet by mouth every 6 hours as needed.   ? tamoxifen (NOLVADEX) 20 mg tablet Take one tablet by mouth daily. (Patient taking differently: Take 20 mg by mouth at bedtime daily.)   ? triamterene-hydrochlorothiazide (MAXZIDE) 75-50 mg tablet Take 1 tablet by mouth daily.     Vitals:    07/17/20 1203   BP: 110/78   Pulse: (!) 134   Temp: 36.7 ?C (98.1 ?F)   TempSrc: Skin   PainSc: Three   Weight: 131.1 kg (289 lb)   Height: 182.9 cm (6')  Body mass index is 39.2 kg/m?Marland Kitchen     Physical Exam  Vitals reviewed.   Constitutional: Appearance: Normal appearance. She is well-groomed.   HENT:      Head: Normocephalic.   Pulmonary:      Effort: Pulmonary effort is normal.   Chest:      Comments: Bilateral breast DIEP healing appropriately.   Abdominal:      Palpations: Abdomen is soft.      Comments: Abdominal incision and umbilicus healing appropriately. Mild sloughing of umbilicus.    Skin:     General: Skin is warm and dry.   Neurological:      Mental Status: She is alert.   Psychiatric:         Mood and Affect: Mood normal.         Behavior: Behavior normal. Behavior is cooperative.         Thought Content: Thought content normal.         Judgment: Judgment normal.          Assessment and Plan:  Patient s/p bilateral DIEP breast reconstruction  Bilateral breast JP drains removed without issue. Abdominal JP drains redressed with CHG  Discontinue doxycycline due to extreme nausea.     RTC in one month for 6 week visit. She may return for JP removal when output is less than 30cc daily for two consecutive days.

## 2020-07-29 ENCOUNTER — Encounter: Admit: 2020-07-29 | Discharge: 2020-07-29 | Payer: BC Managed Care – PPO

## 2020-07-29 DIAGNOSIS — Z9889 Other specified postprocedural states: Secondary | ICD-10-CM

## 2020-07-29 DIAGNOSIS — C50411 Malignant neoplasm of upper-outer quadrant of right female breast: Secondary | ICD-10-CM

## 2020-07-29 MED ORDER — SULFAMETHOXAZOLE-TRIMETHOPRIM 800-160 MG PO TAB
1 | ORAL_TABLET | Freq: Two times a day (BID) | ORAL | 0 refills | Status: AC
Start: 2020-07-29 — End: ?
  Filled 2020-08-05: qty 20, 10d supply, fill #1

## 2020-07-29 NOTE — Telephone Encounter
Pt called with complaint of continued increased drainage output from right abdominal drain, avg 150 cc yellow/clear fluid daily and the feeling that the drain is about to fall out. She has also developed fever with chills. Spoke with Dr. Billie Ruddy, ordered US for evaluation of seroma and IR orders for replacement of drain/aspiration if needed.   Advised pt to take home covid test, results - negative.

## 2020-07-30 ENCOUNTER — Encounter: Admit: 2020-07-30 | Discharge: 2020-07-30 | Payer: BC Managed Care – PPO

## 2020-07-30 ENCOUNTER — Ambulatory Visit: Admit: 2020-07-30 | Discharge: 2020-07-30 | Payer: BC Managed Care – PPO

## 2020-07-30 DIAGNOSIS — C50411 Malignant neoplasm of upper-outer quadrant of right female breast: Secondary | ICD-10-CM

## 2020-07-30 DIAGNOSIS — Z9889 Other specified postprocedural states: Secondary | ICD-10-CM

## 2020-07-30 NOTE — Telephone Encounter
Pt called stating she arrived home this afternoon and her abdominal drain fell out. She is still getting approx 110 cc seroma daily.   Paged IR, [phone], spoke with Saralyn Pilar. They will be in contact with pt to schedule for placement.   Notified pt, advised for tonight to keep area covered with abd or maxi pads, changing out frequently and to keep cleaned/covered.

## 2020-07-30 NOTE — Progress Notes
Interventional Radiology Outpatient Procedure Review Assessment        Reason for consult: Evaluate for drain removal in setting of f/c following DIEP      History of present illness:  Jennifer Durham is a 45 y.o. female patient with a history significant for DIEP flap recon.      Assessment:   - Review of imaging: CTA 04/21/20.   - Review of recent labs and allergies: tegaderm. If none available, is this information necessary prior to procedure.  - Review of anticoagulation medications: aspriin  - Previous ASA score if available: 2 (If ASA IV, anesthesia will run sedation for procedure if sedation indicated).  - Previous IR Anesthetic/Sedation History (if applicable): moderate  - Case discussed with referring Physician (Y/N): no  - Contraindications to procedure: none      Plan:  - This case has been reviewed and approved for anywhere  - Patient position and modality: supine, flouro  - Intra-procedural Sedation/Medication Plan: Lidocaine  - Procedure time frame: <10 days  - Anticoagulation: Per IR peri procedural anticoagulation management protocol  - Labs: Per IR pre procedural lab protocol  - Additional requests after review: none    Lab/Radiology/Other Diagnostic Tests:  Labs:  24-hour labs:               Philipp Deputy, MD

## 2020-07-30 NOTE — Telephone Encounter
Contacted by Timothy Lasso RN from CV service regarding pt's drain that has now fallen out.  I updated Dr Leia Alf that the drain has fallen out.  He states that there is no fluid collection on today [place] and pt does not need to come in.  I called Tebben RN back.  She will discuss with her Doc.

## 2020-07-30 NOTE — Telephone Encounter
I called Jennifer Durham to discuss when she'd like to schedule her return visit. She is seeing Dr. Lilla Shook today and [name], NP on 7/21. She was last seen on 6/16. At that visit we did not complete a BIS reading due to her DIEP surgery on 07/02/20. She did have measurement changes in her right upper arm. She denied experiencing symptoms related to lymphedema (aching, heaviness, fullness) in her right arm. She was experiencing right shoulder discomfort but she wasn't sure if it was possibly due to positioning during her DIEP procedure. I asked Trinidi if she would be open to see me after her visit with Dr. Lilla Shook tomorrow since I will not be in the office on 7/21. She stated that she was in town today for an ultrasound but she was not feeling up to a visit. She currently is not experiencing any symptoms related to lymphedema. She does not have any concerns. She requested for me to check back in with her in 2 weeks.

## 2020-07-30 NOTE — Progress Notes
Jennifer Durham with IR called back, after Dr. Baldwin [place] evaluated previous US taken early today, pt doesn't need drain replaced due to no fluid collection. Advised it's not pocket, but continuous seroma fluid with average daily amounts of 110 cc. Denied request. Sent Dr. Billie Ruddy email due to currently in [place].   Will advise next step for patient in the morning.

## 2020-07-31 ENCOUNTER — Encounter: Admit: 2020-07-31 | Discharge: 2020-07-31 | Payer: BC Managed Care – PPO

## 2020-07-31 ENCOUNTER — Ambulatory Visit: Admit: 2020-07-31 | Discharge: 2020-07-31 | Payer: BC Managed Care – PPO

## 2020-07-31 DIAGNOSIS — J302 Other seasonal allergic rhinitis: Secondary | ICD-10-CM

## 2020-07-31 DIAGNOSIS — Z789 Other specified health status: Secondary | ICD-10-CM

## 2020-07-31 DIAGNOSIS — Z923 Personal history of irradiation: Secondary | ICD-10-CM

## 2020-07-31 DIAGNOSIS — C50919 Malignant neoplasm of unspecified site of unspecified female breast: Secondary | ICD-10-CM

## 2020-07-31 DIAGNOSIS — E282 Polycystic ovarian syndrome: Secondary | ICD-10-CM

## 2020-07-31 DIAGNOSIS — C50411 Malignant neoplasm of upper-outer quadrant of right female breast: Secondary | ICD-10-CM

## 2020-07-31 DIAGNOSIS — E8881 Metabolic syndrome: Secondary | ICD-10-CM

## 2020-07-31 DIAGNOSIS — R12 Heartburn: Secondary | ICD-10-CM

## 2020-07-31 DIAGNOSIS — I10 Essential (primary) hypertension: Secondary | ICD-10-CM

## 2020-07-31 NOTE — Telephone Encounter
Spoke to pt and advised that the drain was going to stay out for now. Advised to keep area clean and covered. If pt notices swelling or tightness of abdomen to contact me to schedule US aspiration. Pt verbalized understanding and stated only limited amount of drainage today.

## 2020-07-31 NOTE — Progress Notes
Radiation Oncology Follow Up Note  Date: 07/31/2020       Jennifer Durham is a 45 y.o. female.     The encounter diagnosis was Malignant neoplasm of upper-outer quadrant of right breast in female, estrogen receptor positive (HCC).  Staging: Cancer Staging  Malignant neoplasm of upper-outer quadrant of right breast in female, estrogen receptor positive (HCC)  Staging form: Breast, AJCC 8th Edition  - Clinical stage from 07/04/2019: Stage IA (cT1c, cN0(f), cM0, G2, ER+, PR+, HER2-) - Signed by Guy Begin, PA-C on 07/12/2019  - Pathologic stage from 08/21/2019: Stage IA (pT1c, pN1a, cM0, G2, ER+, PR+, HER2-) - Signed by Guy Begin, PA-C on 08/30/2019        History of Present Illness  Jennifer Durham?is a 45 y.o.?female?with?Stage IA (cT1c, cN0(f), cM0, G2, ER+, PR+, HER2-).??Stage IA (pT1c, pN1a, cM0, G2, ER+, PR+, HER2-)  I personally reviewed?her?previous medical records during imaging, pathology, and other provider records,?to obtain some of this information.?Her?oncologic history is?briefly?as follows:  ?  The patient is a 45 year old female?with family history of breast cancer in her mother and maternal aunts.??She reports that she first?noticed some dimpling/changes in the right breast in May 2021.??She had already been scheduled to have a screening mammogram at that time and it was converted to a bilateral diagnostic mammogram.the diagnostic mammogram?was performed in [place], [place], on July 04, 2019.??It identified a spiculated mass with architectural distortion measuring up to 2 cm in the upper medial right breast, 10-11 o'clock, 7.7 cm posterior to the nipple there were also noted to be some slightly prominent asymmetric right axillary lymph nodes. ?On same-day ultrasound, the tumor was measured at 1.3 x 1.7 x 1.6 cm. ?There was increased cortical mantle thickening of the right axillary lymph node measuring up to 4 mm. ?BI-RADS 5. ?A right breast and right axillary biopsy performed in [place] on July 04, 2019 carcinoma. ?Outside path review was performed at [place] ?which confirmed grade 2 IDC with a focal lobular growth pattern and lymphoid tissue with no evidence of carcinoma in the right axillary specimen. ?She was seen by Dr. Robina Ade, and Dr. Raul Del, in the [place] breast clinic on July 16, 2019 for evaluation. ?New imaging was performed the same day at [place]. ?It revealed a small mass in the probable 9 o'clock position right breast posterior depth, 2 cm inferior and 2 cm posterior to the known malignancy. ?In the 930 to 10 o'clock position of the right breast middle to posterior depth a 1.7 x 1.9 cm spiculated mass with internal tissue marker clip corresponding to biopsy-proven malignancy. ?In the 830 to 9 o'clock position of the right breast at posterior depth there was a 0.8 cm lobular equal density mass which is not definitively seen on the 2D mammogram images from fibber 25th 2015. ?Targeted ultrasound at [facility] on July 16, 2019 showed a 1.4 x 1.8 x 1.6 cm irregular hypoechoic mass at the 930 location 9 cm from the nipple with internal tissue marker clip. ?It also noted in the right breast 9:00, 12 cm from the nipple, a 0.8 cm morphologically normal small intramammary lymph node corresponding to the additional mammographic mass identified. ?No suspicious right axillary lymph nodes were seen. ?She was recommended for upfront surgery. ?MRI of the bilateral breasts and genetic testing were ordered.  -July 16, 2019?[facility]Risk genetic testing did not identify a clinically significant mutation.  -07/20/2019 MRI bilateral breasts confirmed the presence of an irregular spiculated mass in the right upper outer breast compatible  with previous biopsy of malignancy and no enlarged or morphologically suspicious lymph nodes.  -On August 21, 2019, the patient underwent bilateral skin sparing mastectomy and right sentinel lymph node biopsy, axilla lymph node dissection, and reconstruction. 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 ?A 5 mm focus of metastatic carcinoma was found in 1 of 4 sentinel lymph nodes. ?0 of 27 additional axillary nodes were involved. pT1c N1a M0 (1 of 34 LN+) M0. No LVI, No EIC, No ENE.  ?  -Oncotype DX score 11.?Dr. Raul Del recommended adjuvant chemotherapy to consist of?TC every 3 weeks x4 started 09/26/2019. ?She completed 4 cycles 11/28/2019.?  ?  -She was enrolled in the following clinical trial:  MA.39?(ZOX#[id number])?Tailor RT: A randomized trial of regional radiotherapy and biomarker low risk node positive breast cancer. ?To compare the breast cancer recurrence-free interval?(BCRFI)?between patients that received regional RT or not, defined as time from randomization to time of invasive recurrent disease in the ipsilateral chest wall, breast, regional nodes, distant sites or death due to breast cancer.  ?  She was randomized to the treatment arm and received adjuvant radiation therapy to the right reconstructed breast, chest wall, and regional nodes to a total of 4256 cGy in sixteen treatments. ?A brief description of her radiation treatment course is provided here:  ?  Treatment start date: 02/19/2020  Complete date: 03/11/2020  Dose per fraction: 2.66 Gray  Number fractions:?16  Total dose: 4256 Gray  Number of elapsed days:?16  ?     Review of Systems   Constitutional: Positive for appetite change and fatigue.   HENT: Negative.    Eyes: Negative.    Respiratory: Negative.    Cardiovascular: Negative.    Gastrointestinal: Positive for nausea and vomiting.   Endocrine: Negative.    Genitourinary: Negative.    Musculoskeletal: Positive for myalgias.   Skin: Positive for wound.   Allergic/Immunologic: Negative.    Neurological: Negative.    Hematological: Negative.    Psychiatric/Behavioral: The patient is nervous/anxious.          Subjective:          Jennifer Durham is seen today for routine follow up.  She completed adjuvant radiation therapy for her right breast cancer on March 11, 2020.  She was treated on the IIT hypofractionation clinical trial, and received 4256 cGy in 16 treatments.  In the interim, she has undergone her reconstruction with Dr. Fran Lowes.  Her surgery date was 07/02/2020.  Over the last week or so she has had some issues with the drain on her right hip working its way out and it did come out completely yesterday.  It began hurting and she started developing fevers on Monday of this week.  She has been started on antibiotics and will be following up with him in the near future.  She does still have the drain in the left hip.  She reports that she has been healing up pretty well otherwise and is very pleased with the cosmetic appearance and symmetry.  She denies any other issues or concerns.  She reports no breast pain, no cardiac related chest pain, no chest wall pain.  She does report that overall she has been feeling very good since the surgery.  She does have a little bit of fatigue this week that she thinks is related to the fevers and not sleeping well.  She reports some mild edema at the top of her  right arm and occasionally in her right hand but overall no significant lymphedema.  No history of MI or pericarditis.  No pneumonitis symptoms.  No telangiectasias of the skin or skin changes.  He reports a little bit of tightness in the right upper extremity joint since her surgery.  No numbness or tingling in the upper extremity or neck no induration or radiation changes in the skin tissues.    Objective:         ? aspirin 81 mg chewable tablet Chew one tablet by mouth daily. Take with food.   ? escitalopram oxalate (LEXAPRO) 10 mg tablet TAKE ONE TABLET BY MOUTH EVERY DAY   ? metFORMIN (GLUCOPHAGE) 1,000 mg tablet Take 1,000 mg by mouth twice daily with meals.   ? montelukast (SINGULAIR) 10 mg tablet Take 10 mg by mouth at bedtime daily.   ? ondansetron HCL (ZOFRAN) 4 mg tablet Take one tablet by mouth every 8 hours as needed for Nausea or Vomiting.   ? oxyCODONE (ROXICODONE) 5 mg tablet Take one tablet to two tablets by mouth every 4 hours as needed.   ? prochlorperazine maleate (COMPAZINE) 10 mg tablet Take one tablet by mouth every 6 hours as needed.   ? tamoxifen (NOLVADEX) 20 mg tablet Take one tablet by mouth daily. (Patient taking differently: Take 20 mg by mouth at bedtime daily.)   ? triamterene-hydrochlorothiazide (MAXZIDE) 75-50 mg tablet Take 1 tablet by mouth daily.   ? trimethoprim/sulfamethoxazole (BACTRIM DS) 160/800 mg tablet Take one tablet by mouth twice daily.     Vitals:    07/31/20 0825   BP: 105/73   Pulse: 117   Temp: 36.2 ?C (97.2 ?F)   Resp: 18   SpO2: 100%   PainSc: Zero   Weight: 130.7 kg (288 lb 3.2 oz)     Body mass index is 39.09 kg/m?Marland Kitchen     Pain Score: Zero        Fatigue Scale: 9    KARNOFSKY PERFORMANCE SCORE:  100% Normal, no complaints     Physical Exam  Vitals and nursing note reviewed.   Constitutional:       General: She is not in acute distress.     Appearance: Normal appearance. She is obese. She is not ill-appearing.   HENT:      Head: Normocephalic and atraumatic.   Eyes:      Extraocular Movements: Extraocular movements intact.      Pupils: Pupils are equal, round, and reactive to light.   Cardiovascular:      Rate and Rhythm: Normal rate and regular rhythm.      Pulses: Normal pulses.   Pulmonary:      Effort: Pulmonary effort is normal. No respiratory distress.      Breath sounds: Normal breath sounds.   Chest:   Breasts:      Right: Absent. No axillary adenopathy or supraclavicular adenopathy.      Left: Absent. No axillary adenopathy or supraclavicular adenopathy.            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  Surgical incisions are healing nicely.  The soft tissues of the right chest wall are intact and well-healed without obvious evidence of telangiectasias or radiation dermatitis than some faint hyperpigmentation relative to the chest wall tissues on the upper chest wall on the left.  Abdominal:      Palpations: Abdomen is soft.      Comments: There is a bandage  on the lateral aspect of the right lower abdomen consistent with location of recent surgical drain.  There is a surgical drain located in the left lateral aspect of the lower abdomen.  Surgical incision in the low abdomen horizontally and around the.  Umbilical tissue are healing nicely without evidence of infection.  The reconstructed breast tissue incisions also healing well.   Musculoskeletal:      Cervical back: Normal range of motion. No rigidity.      Comments: No obvious lymphedema in either extremity.  Mild limitation of the right upper extremity range of motion   Lymphadenopathy:      Cervical: No cervical adenopathy.      Upper Body:      Right upper body: No supraclavicular, axillary or pectoral adenopathy.      Left upper body: No supraclavicular, axillary or pectoral adenopathy.   Skin:     General: Skin is warm and dry.   Neurological:      General: No focal deficit present.      Mental Status: She is alert and oriented to person, place, and time. Mental status is at baseline.      Cranial Nerves: No cranial nerve deficit.   Psychiatric:         Mood and Affect: Mood normal.         Behavior: Behavior normal.         Thought Content: Thought content normal.         Judgment: Judgment normal.              Laboratory:    Comprehensive Metabolic Profile    Lab Results   Component Value Date/Time    NA 139 07/04/2020 07:56 AM    K 3.4 (L) 07/04/2020 07:56 AM    CL 103 07/04/2020 07:56 AM    CO2 27 07/04/2020 07:56 AM    GAP 9 07/04/2020 07:56 AM    BUN 11 07/04/2020 07:56 AM    CR 1.00 07/04/2020 07:56 AM    GLU 121 (H) 07/04/2020 07:56 AM    Lab Results   Component Value Date/Time    CA 7.4 (L) 07/04/2020 07:56 AM    ALBUMIN 4.2 11/28/2019 08:10 AM    TOTPROT 6.9 11/28/2019 08:10 AM    ALKPHOS 32 11/28/2019 08:10 AM    AST 14 11/28/2019 08:10 AM    ALT 14 11/28/2019 08:10 AM    TOTBILI 0.4 11/28/2019 08:10 AM    GFR 57 (L) 11/28/2019 08:10 AM    GFRAA >60 11/28/2019 08:10 AM        CBC w diff    Lab Results   Component Value Date/Time    WBC 9.4 07/04/2020 06:48 AM    RBC 2.81 (L) 07/04/2020 06:48 AM    HGB 8.6 (L) 07/04/2020 06:48 AM    HCT 25.4 (L) 07/04/2020 06:48 AM    MCV 90.4 07/04/2020 06:48 AM    MCH 30.6 07/04/2020 06:48 AM    MCHC 33.8 07/04/2020 06:48 AM    RDW 13.7 07/04/2020 06:48 AM    PLTCT 201 07/04/2020 06:48 AM    MPV 7.6 07/04/2020 06:48 AM    Lab Results   Component Value Date/Time    NEUT 71 06/17/2020 02:00 PM    ANC 4.50 06/17/2020 02:00 PM    LYMA 15 (L) 06/17/2020 02:00 PM    ALC 0.93 (L) 06/17/2020 02:00 PM    MONA 7 06/17/2020 02:00 PM    AMC 0.44 06/17/2020 02:00  PM    EOSA 6 (H) 06/17/2020 02:00 PM    AEC 0.34 06/17/2020 02:00 PM    BASA 1 06/17/2020 02:00 PM    ABC 0.05 06/17/2020 02:00 PM            Imaging:   US PELVIS NON OB LIMITED  Narrative: FOCUSED ULTRASOUND OF THE LOWER ABDOMINAL WALL    CLINICAL INDICATION: 45 year old female, bilateral mastectomy status post bilateral breast reconstruction with flap graft. Persistent fluid in the lower abdominal wall. Evaluate for possible aspiration/drainage.    TECHNIQUE: Focused grayscale and color Doppler imaging was performed of lower abdominal wall in the area of interest as directed by the patient.    COMPARISON: CTA abdomen/pelvis 04/21/20     FINDINGS:    There are two surgical drainage catheters, one within the right lower quadrant and the other within the left lower quadrant. Minimal thin, nonencapsulated fluid tracks along the superficial muscular fascia. Both of the surgical drains lie within this minimal fluid collection.    Moderate subcutaneous edema along the visualized lower ventral abdominal wall. No organizing fluid collection is visualized. There is no hypervascularity within the right lower quadrant region on color Doppler interrogation.  Impression: 1.  Moderate subcutaneous edema throughout the lower ventral abdominal wall.   2.  Minimal postoperative fluid along the superficial muscular fascia, associated with the surgical drainage catheters. No organized fluid collections to target for additional drainage.    By my electronic signature, I attest that I have personally reviewed the images for this examination and formulated the interpretations and opinions expressed in this report     Finalized by Judie Petit, M.D. on 07/30/2020 3:10 PM. Dictated by Dion Body, M.D. on 07/30/2020 1:58 PM.       Path:  PATHOLOGY REPORT   Date Value Ref Range Status   08/21/2019   Final    [facility]  [email or web address]    Department of Pathology and Laboratory Medicine  [address] [place], [place] [address]  Surgical Pathology Office:  [phone]  Fax:  [phone]  SURGICAL PATHOLOGY REPORT    NAME: PEBBLES, NICHOL PATH #: [id number] MR #: [id number] SPECIMEN  CLASS: SI BILLING #: [id number] ALT ID #:  LOCATION: [facility] DATE OF  PROCEDURE: 08/21/2019 AGE:  43 SEX: F DATE RECEIVED: 08/21/2019 DOB:  [birth year]  TIME RECEIVED:  11:32 PHYSICIAN: LYNDSEY J KILGORE DATE OF  REPORT: 08/24/2019 COPY TO: ERIC C LAI, MD DATE OF PRINTING: 08/24/2019   Procedures/Addenda  Addendum     Date Ordered:     08/24/2019     Status:  Signed Out  Date Complete:     08/24/2019  By: Sol Blazing, MD  Date Reported:     08/24/2019             Addendum Comment  Breast Cancer Prognostic Panel by Immunohistochemistry (IHC) using  Quantitative Computer-Assisted Image Analysis    Testing performed on block Number: A13  Estrogen Receptor (ER):          96%     Positive     Stain Intensity:  Strong  Progesterone Receptor (PR):     99%     Positive     Stain Intensity:  Strong  Her2:                    0     Negative    The original image analysis report is scanned in Epic under Results  Review.    Disclaimer: The [facility] is certified  under the Clinical Laboratory Improvement Amendments of 1988 (CLIA) to  perform high complexity clinical laboratory testing. The Food and Drug  Administration (FDA) cleared estrogen receptor/progesterone receptor  (ER/PR) assays and the FDA approved Her2 assay are performed on  formalin-fixed, paraffin-embedded tissue sections using the Ventana  Benchmark Ultraview Universal DAB detection kit with Ventana Confirm ER  antibody (clone [id number]), Ventana Confirm PR antibody (clone [id number]), and Ventana  Pathway Her2/neu antibody (clone [id number]). Ki-67 is performed using Dako Flex  Ki-67 antibody (clone MIB-1) with Dako Flex Dab detection kit on  autostainer Link 48. Interpretations of the ER/PR and Her2 results follow  the most current American Society of Clinical Oncology/College of American  Pathologists (ASCO/CAP) guidelines. Estrogen receptor and progesterone  receptor studies are reported as a percentage of positive tumor nuclei  that express immunoreactivity and intensity of staining. For breast  cancer, estrogen receptor and progesterone receptor positivity 1% or  greater is considered positive. HER-2/neu that demonstrates incomplete  membrane staining that is faint/barely perceptible and within d10% of  tumor cells is scored as 0 and within >10% of tumor cells is scored as 1+.  A score of 0 or 1+ is considered negative. Weak to moderate complete  membrane staining in >10% of tumor cells or complete membrane staining  that is intense but within d10% of tumor cells is scored as 2+ equivocal  and will be reflexed to FISH. A score of 3+ requires strong and complete  membrane staining in greater than 10% of tumor cells and is considered  positive. Known positive and negative control tissues show appropriate  staining. Cold ischemic time and formalin fixation time of the tissue meet  the ASCO/CAP requirements unless noted in the pathology report. This assay  has not been validated on decalcified tissues. The image analysis is  performed using Ventana Virtuoso Image Analysis system (v5.6.2). ER  Negative, Low Positive, and Weak staining results are adjudicated  according to the steps in the Standard Operating Procedure of the [facility].                                                         ########################################################################  Final Diagnosis:    A. Breast, right breast skin sparing mastectomy, short stitch superior,  long stitch lateral, mastectomy:  Invasive ductal carcinoma, histologic grade 2, see comment.  Ductal carcinoma, nuclear grade 2, solid and cribriform patterns.  Fibroadenoma.       B. Lymph nodes, right axillary sentinel lymph node #1, biopsy:    There is no evidence of malignancy in two lymph nodes.  (0/2)  Deeper sections and a pancytokeratin immunostain are negative in  support of the above diagnosis.      C. Lymph node, right axillary sentinel lymph node #2, biopsy:    There is no evidence of malignancy in one lymph node.  (0/1)  Deeper sections and a pancytokeratin immunostain are negative in  support of the above diagnosis.      D. Lymph node, right axillary sentinel lymph node #3, biopsy:    Metastatic carcinoma in one lymph node (1/1), 5 mm in greatest  dimension.  Deeper sections and a pancytokeratin immunostain are positive in  support of the above  diagnosis.      E. Lymph node, right axillary sentinel lymph node #4:    There is no evidence of malignancy in one lymph node.  (0/1)  Deeper sections and a pancytokeratin immunostain are negative in  support of the above diagnosis.      F. Lymph node, right axillary sentinel lymph node #5, biopsy:    There is no evidence of malignancy in one lymph node.  (0/1)  Deeper sections and a pancytokeratin immunostain are negative in  support of the above diagnosis.      G. Fibroadipose tissue, right additional anterior lateral margin clip  marks new true margin, excision: Unremarkable fibroadipose tissue.    H. Lymph node, right low axillary lymph node, excision:    There is no evidence of malignancy in one lymph node.  (0/1)    I. Breast, left breast skin sparing mastectomy, short stitch superior,  long stitch lateral, mastectomy:    Fibrocystic changes.  There is no evidence of malignancy.      J. Lymph nodes, right axillary contents level I and II, axillary  dissection:    Negative for malignancy in twenty-seven lymph nodes (0/27).  Biopsy site present in one lymph node.              K. Skin, right mastectomy skin, excision:  Dermal fibrosis.    Comment:  INVASIVE CARCINOMA OF THE BREAST: RESECTION  CAP Version: Invasive Breast 4.4.0.0    Procedure: Skin sparing mastectomy   Laterality: Right  Tumor Site: 9:30, 8 cm FTN  Histologic Type: Invasive ductal carcinoma  Size of Invasive Component: 1.8 x 1.7 x 1.2 cm     Surgical Margins:  Mastectomy  For invasive carcinoma: All margins are greater than 2 cm  For DCIS: All margins are greater than 2 cm              Histologic Grade (Nottingham Histologic Score): II/III  Tubule Formation: 3  Nuclear Grade: 3  Mitotic Count (40x objective): 1  Total Nottingham Score: 7/9  Ductal Carcinoma In-situ (DCIS): Present  Extensive intraductal component (>25%): Absent  DCIS within and/or adjacent to invasive carcinoma: Yes   DCIS separate from invasive carcinoma: No    Lobular Carcinoma In-situ (LCIS): Absent  Lymph-Vascular Invasion: Not identified  Nipple Involvement: Absent   Skin Involvement: Absent   Skeletal Muscle: Not involved   Lymph Node Sampling:  Sentinel lymph node with axillary dissection  Total number of lymph nodes examined: 34  Number of lymph nodes with macrometastases (>2 mm): 1  Number of lymph nodes with micrometastases (>0.2 mm to 2 mm): 0  Number of lymph nodes with isolated tumor cells (<0.2 mm and  <200 cells): 0  Size of largest metastatic deposit (millimeters): 5 mm  Extranodal extension: Not identified  Treatment Effect in the Breast: No known presurgical therapy  Prognostic markers: The results will be issued as an addendum.  Time between tumor removal and placement into formalin < 1 hour: Yes  Fixation Time between 6-72 hours: Yes    Pathologic Staging (pTNM, AJCC 8th edition):  pT1c N1a    Primary Tumor (Invasive Carcinoma) (pT)   pT1c:     Tumor >10 mm but d20 mm in greatest dimension    Regional Lymph Nodes (pN)  pN1a:     Metastases in 1 to 3 axillary lymph nodes, at least 1 metastasis  larger than 2.0 mm    Distant Metastasis (M)    Not applicable  The pathologic stage assigned here should be regarded as provisional, as  it reflects only current pathologic data and does not incorporate full  knowledge of the patient's clinical status and/or prior pathology.    Block for Biomarker Testing: A13       Attestation:  By this signature, I attest that I have personally formu...   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 ?She is status post bilateral skin sparing mastectomy with temporary expander placement, right sentinel lymph node biopsy, axillary lymph node dissection and 1 of 34 lymph nodes positive for a 5 mm metastatic deposit, no LVI, no ENE, no EIC. ?Adjuvant TC chemotherapy x4 cycles is scheduled to be initiated on 09/26/2019. ?Fourth cycle completed 11/28/2019. ?She is a candidate for adjuvant postmastectomy radiation therapy to improve local regional control and breast cancer specific mortality.??She enrolled in MA.39?(ZOX#[id number])?Tailor RT, and was randomized to the treatment arm. ?She received a total of 4256 cGy in sixteen treatments which she completed between 02/19/2020 and 03/11/2020.    The patient underwent surgical reconstruction of the bilateral breasts on 07/02/2020.  She is still healing.    - The patient will keep her upcoming appointments with her plastic surgeon and continue the antibiotics per his direction.  - The patient will keep her upcoming appointments for medical oncology.  - The patient will return to our clinic in approximately 6 months for continued follow-up.  She may contact us in the interim if there is questions or concerns requiring earlier assessment.  ?                      Elby Showers, MD    I spent a total of 35 minutes today in direct patient evaluation with more than 50% spent in treatment plan formulation, counseling, symptom management discussion and consultation as outlined above.     Parts of this note were created using voice recognition software.  Please excuse any grammatical or typographical errors.

## 2020-08-04 ENCOUNTER — Encounter: Admit: 2020-08-04 | Discharge: 2020-08-04 | Payer: BC Managed Care – PPO

## 2020-08-04 ENCOUNTER — Ambulatory Visit: Admit: 2020-08-04 | Discharge: 2020-08-04 | Payer: BC Managed Care – PPO

## 2020-08-04 DIAGNOSIS — C50411 Malignant neoplasm of upper-outer quadrant of right female breast: Secondary | ICD-10-CM

## 2020-08-04 DIAGNOSIS — Z9889 Other specified postprocedural states: Secondary | ICD-10-CM

## 2020-08-05 ENCOUNTER — Encounter: Admit: 2020-08-05 | Discharge: 2020-08-05 | Payer: BC Managed Care – PPO

## 2020-08-05 ENCOUNTER — Ambulatory Visit: Admit: 2020-08-05 | Discharge: 2020-08-05 | Payer: BC Managed Care – PPO

## 2020-08-05 DIAGNOSIS — I10 Essential (primary) hypertension: Secondary | ICD-10-CM

## 2020-08-05 DIAGNOSIS — C50919 Malignant neoplasm of unspecified site of unspecified female breast: Secondary | ICD-10-CM

## 2020-08-05 DIAGNOSIS — E282 Polycystic ovarian syndrome: Secondary | ICD-10-CM

## 2020-08-05 DIAGNOSIS — Z923 Personal history of irradiation: Secondary | ICD-10-CM

## 2020-08-05 DIAGNOSIS — J302 Other seasonal allergic rhinitis: Secondary | ICD-10-CM

## 2020-08-05 DIAGNOSIS — Z789 Other specified health status: Secondary | ICD-10-CM

## 2020-08-05 DIAGNOSIS — E8881 Metabolic syndrome: Secondary | ICD-10-CM

## 2020-08-05 DIAGNOSIS — R12 Heartburn: Secondary | ICD-10-CM

## 2020-08-05 MED ORDER — CEFAZOLIN 1 GRAM IJ SOLR
INTRAVENOUS | 0 refills | Status: DC
Start: 2020-08-05 — End: 2020-08-05
  Administered 2020-08-05: 21:00:00 3 g via INTRAVENOUS

## 2020-08-05 MED ORDER — HALOPERIDOL LACTATE 5 MG/ML IJ SOLN
INTRAVENOUS | 0 refills | Status: DC
Start: 2020-08-05 — End: 2020-08-05
  Administered 2020-08-05: 20:00:00 1 mg via INTRAVENOUS

## 2020-08-05 MED ORDER — PROPOFOL INJ 10 MG/ML IV VIAL
INTRAVENOUS | 0 refills | Status: DC
Start: 2020-08-05 — End: 2020-08-05
  Administered 2020-08-05: 20:00:00 200 mg via INTRAVENOUS

## 2020-08-05 MED ORDER — ONDANSETRON HCL (PF) 4 MG/2 ML IJ SOLN
INTRAVENOUS | 0 refills | Status: DC
Start: 2020-08-05 — End: 2020-08-05
  Administered 2020-08-05: 21:00:00 4 mg via INTRAVENOUS

## 2020-08-05 MED ORDER — MIDAZOLAM 1 MG/ML IJ SOLN
INTRAVENOUS | 0 refills | Status: DC
Start: 2020-08-05 — End: 2020-08-05
  Administered 2020-08-05: 20:00:00 2 mg via INTRAVENOUS

## 2020-08-05 MED ORDER — LIDOCAINE (PF) 200 MG/10 ML (2 %) IJ SYRG
INTRAVENOUS | 0 refills | Status: DC
Start: 2020-08-05 — End: 2020-08-05
  Administered 2020-08-05: 20:00:00 100 mg via INTRAVENOUS

## 2020-08-05 MED ORDER — FAMOTIDINE (PF) 20 MG/2 ML IV SOLN
INTRAVENOUS | 0 refills | Status: DC
Start: 2020-08-05 — End: 2020-08-05
  Administered 2020-08-05: 20:00:00 20 mg via INTRAVENOUS

## 2020-08-05 MED ORDER — DEXAMETHASONE SODIUM PHOSPHATE 4 MG/ML IJ SOLN
INTRAVENOUS | 0 refills | Status: DC
Start: 2020-08-05 — End: 2020-08-05
  Administered 2020-08-05: 20:00:00 4 mg via INTRAVENOUS

## 2020-08-05 MED ORDER — ACETAMINOPHEN 1,000 MG/100 ML (10 MG/ML) IV SOLN
INTRAVENOUS | 0 refills | Status: DC
Start: 2020-08-05 — End: 2020-08-05
  Administered 2020-08-05: 21:00:00 1000 mg via INTRAVENOUS

## 2020-08-05 MED ORDER — FENTANYL CITRATE (PF) 50 MCG/ML IJ SOLN
INTRAVENOUS | 0 refills | Status: DC
Start: 2020-08-05 — End: 2020-08-05
  Administered 2020-08-05: 21:00:00 25 ug via INTRAVENOUS
  Administered 2020-08-05: 20:00:00 50 ug via INTRAVENOUS
  Administered 2020-08-05: 21:00:00 25 ug via INTRAVENOUS

## 2020-08-05 MED ADMIN — THROMBIN (BOVINE) 20,000 UNIT TP SOLR [79799]: 1 | TOPICAL | @ 21:00:00 | Stop: 2020-08-05 | NDC 60793021720

## 2020-08-05 MED ADMIN — LACTATED RINGERS IV SOLP [4318]: 1000.000 mL | INTRAVENOUS | @ 19:00:00 | Stop: 2020-08-06 | NDC 00338011704

## 2020-08-05 MED ADMIN — SODIUM CHLORIDE 0.9% IRRIGATION BAG [210330]: 3000 mL | @ 21:00:00 | Stop: 2020-08-05 | NDC 00338004747

## 2020-08-05 MED FILL — OXYCODONE 5 MG PO TAB: 5 mg | ORAL | 3 days supply | Qty: 25 | Fill #1 | Status: CP

## 2020-08-05 NOTE — Anesthesia Pre-Procedure Evaluation
Anesthesia Pre-Procedure Evaluation    Name: Jennifer Durham      MRN: [id number]     DOB: [birth year]     Age: 45 y.o.     Sex: female   _________________________________________________________________________     Procedure Info:   Procedure Information     Date/Time: 08/05/20 1418    Procedure: INCISION AND DRAINAGE HEMATOMA/ SEROMA - TORSO (N/A ) - CASE LENGTH 1.5 HOURS    Location: [facility] / [facility]    Surgeons: Marlinda Mike, MD          Physical Assessment  Vital Signs (last filed in past 24 hours):  BP: 113/78 (07/12 1303)  Temp: 36.7 ?C (98.1 ?F) (07/12 1303)  Pulse: 100 (07/12 1303)  Respirations: 14 PER MINUTE (07/12 1303)  SpO2: 99 % (07/12 1303)  O2 Device: None (Room air) (07/12 1303)  Height: 182.9 cm (6') (07/12 1303)  Weight: 130.1 kg (286 lb 12.8 oz) (07/12 1303)      Patient History   Allergies   Allergen Reactions   ? Tegaderm RASH        Current Medications    Medication Directions   aspirin 81 mg chewable tablet Chew one tablet by mouth daily. Take with food.   escitalopram oxalate (LEXAPRO) 10 mg tablet TAKE ONE TABLET BY MOUTH EVERY DAY   metFORMIN (GLUCOPHAGE) 1,000 mg tablet Take 1,000 mg by mouth twice daily with meals.   montelukast (SINGULAIR) 10 mg tablet Take 10 mg by mouth at bedtime daily.   ondansetron HCL (ZOFRAN) 4 mg tablet Take one tablet by mouth every 8 hours as needed for Nausea or Vomiting.   oxyCODONE (ROXICODONE) 5 mg tablet Take one tablet to two tablets by mouth every 4 hours as needed.   prochlorperazine maleate (COMPAZINE) 10 mg tablet Take one tablet by mouth every 6 hours as needed.   tamoxifen (NOLVADEX) 20 mg tablet Take one tablet by mouth daily.  Patient taking differently: Take 20 mg by mouth at bedtime daily.   triamterene-hydrochlorothiazide (MAXZIDE) 75-50 mg tablet Take 1 tablet by mouth daily.   trimethoprim/sulfamethoxazole (BACTRIM DS) 160/800 mg tablet Take one tablet by mouth twice daily.         Review of Systems/Medical History      Patient summary reviewed  Nursing notes reviewed  Pertinent labs reviewed  Difficult IV access    PONV Screening: Female gender and Non-smoker  No history of anesthetic complications  No family history of anesthetic complications      Airway - negative        No TMJ      Pulmonary - negative      Not a current smoker        No indications/hx of asthma    no COPD      No recent URI      No sleep apnea      Cardiovascular         Exercise tolerance: >4 METS (pt able to achieve 8.23 METs per [facility])      Beta Blocker therapy: No      Beta blockers within 24 hours: n/a        Hypertension, well controlled      No valvular problems/murmurs      No palpitations      No dysrhythmias      No angina      No hyperlipidemia      No orthopnea  No dyspnea on exertion      No syncope      GI/Hepatic/Renal           GERD ( occasional), well controlled      No hx of liver disease     No renal disease      Neuro/Psych       No seizures      No CVA      No headaches      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)        Psychiatric history ( controlled)          Anxiety      Musculoskeletal - negative        No neck pain      No back pain      Endocrine/Other       No diabetes      No hypothyroidism      No hyperthyroidism      No anemia      No blood dyscrasia      No history of blood transfusion      Malignancy (right breast cancer- s/p XRT, chemotx, mastectomy)      Obesity (BMI 42)      PCOS  Insulin resistance    Constitution - negative   Physical Exam    Airway Findings      Mallampati: II      TM distance: <3 FB      Neck ROM: full      Mouth opening: good      Comments: 07/02/20; 0800; Ventilated by mask (1); Direct laryngoscopy; Single-Lumen; ETT Size: 7mm; GlideScope; Blade Size: 3; Cricoid Pressure: No; Oral; 1-Full view of the glottis; 1 insertion attempt; Auscultation, ETCO2 Detector; Vol of Air in Cuff: 6 mL; Taped at Gums: 22 centimeters; 07/02/20; 1916    Dental Findings:       Upper dentures and partials    Cardiovascular Findings:       Rhythm: regular      Rate: normal      No murmur, no carotid bruit, no peripheral edema    Pulmonary Findings:       Breath sounds clear to auscultation.    Abdominal Findings:       Obese      Abdomen soft      Bowel sounds normal.      Comments: abdominal drain with 600 ml drainage per 24 hours serosanquinous    Neurological Findings:       Alert and oriented x 3    Normal mental status    Constitutional findings:       No acute distress      Well-developed      Well-nourished       Diagnostic Tests  Hematology:   Lab Results   Component Value Date    HGB 8.6 07/04/2020    HCT 25.4 07/04/2020    PLTCT 201 07/04/2020    WBC 9.4 07/04/2020    NEUT 71 06/17/2020    ANC 4.50 06/17/2020    ALC 0.93 06/17/2020    MONA 7 06/17/2020    AMC 0.44 06/17/2020    EOSA 6 06/17/2020    ABC 0.05 06/17/2020    MCV 90.4 07/04/2020    MCH 30.6 07/04/2020    MCHC 33.8 07/04/2020    MPV 7.6 07/04/2020    RDW 13.7 07/04/2020         General Chemistry:   Lab  Results   Component Value Date    NA 139 07/04/2020    K 3.4 07/04/2020    CL 103 07/04/2020    CO2 27 07/04/2020    GAP 9 07/04/2020    BUN 11 07/04/2020    CR 1.00 07/04/2020    GLU 121 07/04/2020    CA 7.4 07/04/2020    ALBUMIN 4.2 11/28/2019    TOTBILI 0.4 11/28/2019      Coagulation: No results found for: PT, PTT, INR      Anesthesia Plan    ASA score: 3   Plan: general  Induction method: intravenous  NPO status: acceptable      Informed Consent  Anesthetic plan and risks discussed with patient.  Use of blood products discussed with patient  Blood Consent: consented      Plan discussed with: CRNA and surgeon/proceduralist.     Labs: CBC, BMP, T&S, DOS: T&C  ROI: none  Consults: none

## 2020-08-07 ENCOUNTER — Encounter: Admit: 2020-08-07 | Discharge: 2020-08-07 | Payer: BC Managed Care – PPO

## 2020-08-07 DIAGNOSIS — E8881 Metabolic syndrome: Secondary | ICD-10-CM

## 2020-08-07 DIAGNOSIS — E282 Polycystic ovarian syndrome: Secondary | ICD-10-CM

## 2020-08-07 DIAGNOSIS — C50919 Malignant neoplasm of unspecified site of unspecified female breast: Secondary | ICD-10-CM

## 2020-08-07 DIAGNOSIS — Z923 Personal history of irradiation: Secondary | ICD-10-CM

## 2020-08-07 DIAGNOSIS — I10 Essential (primary) hypertension: Secondary | ICD-10-CM

## 2020-08-07 DIAGNOSIS — R12 Heartburn: Secondary | ICD-10-CM

## 2020-08-07 DIAGNOSIS — Z789 Other specified health status: Secondary | ICD-10-CM

## 2020-08-07 DIAGNOSIS — J302 Other seasonal allergic rhinitis: Secondary | ICD-10-CM

## 2020-08-10 ENCOUNTER — Encounter: Admit: 2020-08-10 | Discharge: 2020-08-10 | Payer: BC Managed Care – PPO

## 2020-08-10 MED ORDER — ESCITALOPRAM OXALATE 10 MG PO TAB
ORAL_TABLET | Freq: Every day | 1 refills
Start: 2020-08-10 — End: ?

## 2020-08-14 ENCOUNTER — Encounter: Admit: 2020-08-14 | Discharge: 2020-08-14 | Payer: BC Managed Care – PPO

## 2020-08-14 ENCOUNTER — Ambulatory Visit: Admit: 2020-08-14 | Discharge: 2020-08-15 | Payer: BC Managed Care – PPO

## 2020-08-14 DIAGNOSIS — E8881 Metabolic syndrome: Secondary | ICD-10-CM

## 2020-08-14 DIAGNOSIS — J302 Other seasonal allergic rhinitis: Secondary | ICD-10-CM

## 2020-08-14 DIAGNOSIS — E282 Polycystic ovarian syndrome: Secondary | ICD-10-CM

## 2020-08-14 DIAGNOSIS — R12 Heartburn: Secondary | ICD-10-CM

## 2020-08-14 DIAGNOSIS — Z789 Other specified health status: Secondary | ICD-10-CM

## 2020-08-14 DIAGNOSIS — Z923 Personal history of irradiation: Secondary | ICD-10-CM

## 2020-08-14 DIAGNOSIS — I10 Essential (primary) hypertension: Secondary | ICD-10-CM

## 2020-08-14 DIAGNOSIS — C50411 Malignant neoplasm of upper-outer quadrant of right female breast: Secondary | ICD-10-CM

## 2020-08-14 DIAGNOSIS — Z9889 Other specified postprocedural states: Secondary | ICD-10-CM

## 2020-08-14 DIAGNOSIS — C50919 Malignant neoplasm of unspecified site of unspecified female breast: Secondary | ICD-10-CM

## 2020-08-14 MED ORDER — FLUCONAZOLE 150 MG PO TAB
150 mg | ORAL_TABLET | ORAL | 0 refills | 3.00000 days | Status: AC
Start: 2020-08-14 — End: ?

## 2020-08-14 NOTE — Progress Notes
Date of Service: 08/14/2020    Subjective:             Jennifer Durham is a 45 y.o. female.  Cancer Staging  Malignant neoplasm of upper-outer quadrant of right breast in female, estrogen receptor positive (HCC)  Staging form: Breast, AJCC 8th Edition  - Clinical stage from 07/04/2019: Stage IA (cT1c, cN0(f), cM0, G2, ER+, PR+, HER2-) - Signed by Guy Begin, PA-C on 07/12/2019  - Pathologic stage from 08/21/2019: Stage IA (pT1c, pN1a, cM0, G2, ER+, PR+, HER2-) - Signed by Guy Begin, PA-C on 08/30/2019    History of Present Illness  Patient returns to clinic postoperatively. She notes pain has been tolerable but the fatigue has been persistent. Patient notes the JP output has been much more reasonable and they are trending downwards.   Allergies   Allergen Reactions   ? Tegaderm RASH        Review of Systems   Constitutional: Negative.    HENT: Negative.    Eyes: Negative.    Respiratory: Negative.    Cardiovascular: Negative.    Gastrointestinal: Negative.    Endocrine: Negative.    Genitourinary: Negative.    Musculoskeletal: Negative.    Skin: Negative.    Allergic/Immunologic: Negative.    Neurological: Negative.    Hematological: Negative.    Psychiatric/Behavioral: Negative.          Objective:         ? aspirin 81 mg chewable tablet Chew one tablet by mouth daily. Take with food.   ? escitalopram oxalate (LEXAPRO) 10 mg tablet TAKE ONE TABLET BY MOUTH EVERY DAY   ? fluconazole (DIFLUCAN) 150 mg tablet Take one tablet by mouth every 7 days.   ? metFORMIN (GLUCOPHAGE) 1,000 mg tablet Take 1,000 mg by mouth twice daily with meals.   ? montelukast (SINGULAIR) 10 mg tablet Take 10 mg by mouth at bedtime daily.   ? ondansetron HCL (ZOFRAN) 4 mg tablet Take one tablet by mouth every 8 hours as needed for Nausea or Vomiting.   ? oxyCODONE (ROXICODONE) 5 mg tablet Take one tablet to two tablets by mouth every 4 hours as needed.   ? prochlorperazine maleate (COMPAZINE) 10 mg tablet Take one tablet by mouth every 6 hours as needed.   ? tamoxifen (NOLVADEX) 20 mg tablet Take one tablet by mouth daily. (Patient taking differently: Take 20 mg by mouth at bedtime daily.)   ? triamterene-hydrochlorothiazide (MAXZIDE) 75-50 mg tablet Take 1 tablet by mouth daily.   ? trimethoprim/sulfamethoxazole (BACTRIM DS) 160/800 mg tablet Take one tablet by mouth twice daily for 10 days.     Vitals:    08/14/20 0807   Temp: 36.4 ?C (97.5 ?F)   PainSc: Zero   Weight: 130.6 kg (288 lb)   Height: 182.9 cm (6')     Body mass index is 39.06 kg/m?Marland Kitchen     Physical Exam  Vitals reviewed.   Constitutional:       Appearance: Normal appearance. She is well-groomed.   HENT:      Head: Normocephalic.   Pulmonary:      Effort: Pulmonary effort is normal.   Chest:      Comments: bilateral breast DIEP healing well. Spititng suture from left superior breast.  Abdominal:      Palpations: Abdomen is soft.      Tenderness: There is abdominal tenderness.      Comments: Abdominal incision healing appropriately.    Skin:  General: Skin is warm and dry.   Neurological:      Mental Status: She is alert.   Psychiatric:         Mood and Affect: Mood normal.         Behavior: Behavior normal. Behavior is cooperative.         Thought Content: Thought content normal.         Judgment: Judgment normal.          Assessment and Plan:  Patient s/p abdominal I & D due to seroma accumulation.  Redressed JP drains. Output should be less than 30cc daily for two consecutive days.  Continue abdominal compression.     RTC in one week.

## 2020-08-20 ENCOUNTER — Encounter: Admit: 2020-08-20 | Discharge: 2020-08-20 | Payer: BC Managed Care – PPO

## 2020-08-20 ENCOUNTER — Ambulatory Visit: Admit: 2020-08-20 | Discharge: 2020-08-21 | Payer: BC Managed Care – PPO

## 2020-08-20 DIAGNOSIS — Z9889 Other specified postprocedural states: Secondary | ICD-10-CM

## 2020-08-20 DIAGNOSIS — J302 Other seasonal allergic rhinitis: Secondary | ICD-10-CM

## 2020-08-20 DIAGNOSIS — C50411 Malignant neoplasm of upper-outer quadrant of right female breast: Secondary | ICD-10-CM

## 2020-08-20 DIAGNOSIS — I10 Essential (primary) hypertension: Secondary | ICD-10-CM

## 2020-08-20 DIAGNOSIS — R12 Heartburn: Secondary | ICD-10-CM

## 2020-08-20 DIAGNOSIS — E8881 Metabolic syndrome: Secondary | ICD-10-CM

## 2020-08-20 DIAGNOSIS — C50919 Malignant neoplasm of unspecified site of unspecified female breast: Secondary | ICD-10-CM

## 2020-08-20 DIAGNOSIS — E282 Polycystic ovarian syndrome: Secondary | ICD-10-CM

## 2020-08-20 DIAGNOSIS — Z789 Other specified health status: Secondary | ICD-10-CM

## 2020-08-20 DIAGNOSIS — Z923 Personal history of irradiation: Secondary | ICD-10-CM

## 2020-08-20 DIAGNOSIS — S301XXD Contusion of abdominal wall, subsequent encounter: Secondary | ICD-10-CM

## 2020-08-20 NOTE — Progress Notes
Date of Service: 08/20/2020    Subjective:             Jennifer Durham is a 45 y.o. female.  Cancer Staging  Malignant neoplasm of upper-outer quadrant of right breast in female, estrogen receptor positive (HCC)  Staging form: Breast, AJCC 8th Edition  - Clinical stage from 07/04/2019: Stage IA (cT1c, cN0(f), cM0, G2, ER+, PR+, HER2-) - Signed by Guy Begin, PA-C on 07/12/2019  - Pathologic stage from 08/21/2019: Stage IA (pT1c, pN1a, cM0, G2, ER+, PR+, HER2-) - Signed by Guy Begin, PA-C on 08/30/2019    History of Present Illness  Patient returns to clinic postoperatively. She notes her left abdominal JP drain has an output of ~30cc total daily and her right abdominal JP is greater than 30cc daily. She has no other concerns at this time.  Allergies   Allergen Reactions   ? Tegaderm RASH        Review of Systems   Constitutional: Negative.    HENT: Negative.    Eyes: Negative.    Respiratory: Negative.    Cardiovascular: Negative.    Gastrointestinal: Negative.    Endocrine: Negative.    Genitourinary: Negative.    Musculoskeletal: Negative.    Skin: Negative.    Allergic/Immunologic: Negative.    Neurological: Negative.    Hematological: Negative.    Psychiatric/Behavioral: Negative.      Medical History:   Diagnosis Date   ? Breast cancer (HCC) 07/04/2019    right   ? Heartburn    ? Hx of radiation therapy 2022   ? Hypertension    ? Insulin resistance    ? Limb alert care status     No venipuncture/BP's to RUE   ? PCOS (polycystic ovarian syndrome)    ? Seasonal allergies     Frequent sinusitis     Surgical History:   Procedure Laterality Date   ? HX TONSILLECTOMY  2001    turbinates/adenoids 2015   ? HX TONSIL AND ADENOIDECTOMY  2011   ? LEFT PERONEAL TENDON REPAIR Left 12/15/2016    Performed by Vopat, Lowry Ram, MD at [facility]   ? Left Skin Sparing Mastectomy Left 08/21/2019    Performed by Renford Dills, MD at [facility]   ? IDENTIFICATION SENTINEL LYMPH NODE Right 08/21/2019    Performed by Renford Dills, MD at [facility]   ? INJECTION RADIOACTIVE TRACER FOR SENTINEL NODE IDENTIFICATION Right 08/21/2019    Performed by Renford Dills, MD at [facility]   ? RADIOLOGICAL EXAM SURGICAL SPECIMEN Right 08/21/2019    Performed by Renford Dills, MD at [facility]   ? MASTECTOMY - MODIFIED RADICAL WITH AXILLARY LYMPH NODE DISSECTION Right 08/21/2019    Performed by Renford Dills, MD at [facility]   ? RECONSTRUCTION BREAST WITH TISSUE EXPANDER AND SUBSEQUENT EXPANSION - IMMEDIATE/ DELAYED Bilateral 08/21/2019    Performed by Marlinda Mike, MD at [facility]   ? IMPLANTATION BIOLOGIC IMPLANT FOR SOFT TISSUE REINFORCEMENT Bilateral 08/21/2019    Performed by Marlinda Mike, MD at [facility]   ? RECONSTRUCTION BREAST WITH DEEP INFERIOR EPIGASTRIC PERFORATOR FLAP/ SUPERFICIAL INFERIOR EPIGASTRIC ARTERY FLAP Bilateral 07/02/2020    Performed by Marlinda Mike, MD at [facility]   ? RECONSTRUCTION BREAST WITH FREE FLAP - request 22 modifier, complicated procedure due to obesity and intramuscular course Bilateral 07/02/2020    Performed by Marlinda Mike, MD at [facility] OR   ?  INTRAVENOUS INJECTION AGENT TO TEST VASCULAR FLOW IN FLAP/ GRAFT Bilateral 07/02/2020    Performed by Marlinda Mike, MD at [facility] OR   ? APPLICATION NEGATIVE PRESSURE WOUND THERAPY Bilateral 07/02/2020    Performed by Marlinda Mike, MD at [facility] OR   ? IMPLANTATION MESH CLOSURE OF DEBRIDEMENT FOR NECROTIZING SOFT TISSUE INFECTION Bilateral 07/02/2020    Performed by Marlinda Mike, MD at [facility] OR   ? REMOVAL TISSUE EXPANDER Bilateral 07/02/2020    Performed by Marlinda Mike, MD at [facility] OR   ? INCISION AND DRAINAGE HEMATOMA/ SEROMA - TORSO N/A 08/05/2020    Performed by Marlinda Mike, MD at [facility] OR   ? REPAIR INTERMEDIATE WOUND 12.6 CM TO 20.0 CM - TRUNK - total length 24cm  08/05/2020    Performed by Marlinda Mike, MD at [facility] OR     Family History   Problem Relation Age of Onset   ? Heart Attack Mother    ? Cancer Mother    ? Diabetes Mother    ? Cancer-Breast Mother 32   ? Heart Disease Mother    ? Hypertension Mother    ? Heart Disease Father    ? Hypothyroid Father    ? Hypertension Father    ? Thyroid Disease Father    ? Hypertension Brother    ? Cancer-Breast Maternal Aunt 50   ? Cancer-Ovarian Maternal Aunt 50   ? Diabetes Maternal Aunt    ? Arthritis-rheumatoid Maternal Aunt    ? Diabetes Maternal Grandmother    ? Heart Disease Maternal Grandfather      Social History     Socioeconomic History   ? Marital status: Married   Tobacco Use   ? Smoking status: Never Smoker   ? Smokeless tobacco: Never Used   Vaping Use   ? Vaping Use: Never used   Substance and Sexual Activity   ? Alcohol use: Yes     Alcohol/week: 1.0 standard drink     Types: 1 Shots of liquor per week     Comment: seldom   ? Drug use: No   ? Sexual activity: Yes     Partners: Male     Birth control/protection: I.U.D.     Vaping/E-liquid Use   ? Vaping Use Never User      Objective:         ? aspirin 81 mg chewable tablet Chew one tablet by mouth daily. Take with food.   ? escitalopram oxalate (LEXAPRO) 10 mg tablet TAKE ONE TABLET BY MOUTH EVERY DAY   ? fluconazole (DIFLUCAN) 150 mg tablet Take one tablet by mouth every 7 days.   ? metFORMIN (GLUCOPHAGE) 1,000 mg tablet Take 1,000 mg by mouth twice daily with meals.   ? montelukast (SINGULAIR) 10 mg tablet Take 10 mg by mouth at bedtime daily.   ? ondansetron HCL (ZOFRAN) 4 mg tablet Take one tablet by mouth every 8 hours as needed for Nausea or Vomiting.   ? prochlorperazine maleate (COMPAZINE) 10 mg tablet Take one tablet by mouth every 6 hours as needed.   ? tamoxifen (NOLVADEX) 20 mg tablet Take one tablet by mouth daily. (Patient taking differently: Take 20 mg by mouth at bedtime daily.)   ? triamterene-hydrochlorothiazide (MAXZIDE) 75-50 mg tablet Take 1 tablet by mouth daily.     Vitals:    08/20/20 0805   BP: 124/85   Pulse: (!) 130   Weight: 128.8 kg (284 lb)   Height:  177.8 cm (5' 10)     Body mass index is 40.75 kg/m?Marland Kitchen     Physical Exam  Vitals reviewed.   Constitutional:       Appearance: Normal appearance. She is well-groomed.   HENT:      Head: Normocephalic.   Pulmonary:      Effort: Pulmonary effort is normal.   Chest:      Comments: Bilateral DIEP well approximated and healing appropriately.   Abdominal:      Palpations: Abdomen is soft.      Comments: Abdominal incision and umbilicus healing appropriately. Spitting suture of right lateral abdomen. No palpable fluid accumulation.    Skin:     General: Skin is warm and dry.   Neurological:      Mental Status: She is alert.   Psychiatric:         Mood and Affect: Mood normal.         Behavior: Behavior normal. Behavior is cooperative.         Thought Content: Thought content normal.         Judgment: Judgment normal.          Assessment and Plan:  Patient s/p bilateral DIEP with abdominal washout due to abdominal seroma > 600cc   Left abdominal JP drain removed without issue.   Right abdominal JP drain redressed with CHG tegaderm.   Encouraged patient to reach out to our office if her JP output is less than 30cc daily later this week.

## 2020-08-22 ENCOUNTER — Encounter: Admit: 2020-08-22 | Discharge: 2020-08-22 | Payer: BC Managed Care – PPO

## 2020-09-12 ENCOUNTER — Encounter: Admit: 2020-09-12 | Discharge: 2020-09-12 | Payer: BC Managed Care – PPO

## 2020-09-15 ENCOUNTER — Ambulatory Visit: Admit: 2020-09-15 | Discharge: 2020-09-15 | Payer: BC Managed Care – PPO

## 2020-09-15 ENCOUNTER — Encounter: Admit: 2020-09-15 | Discharge: 2020-09-15 | Payer: BC Managed Care – PPO

## 2020-09-15 DIAGNOSIS — C50411 Malignant neoplasm of upper-outer quadrant of right female breast: Secondary | ICD-10-CM

## 2020-09-15 NOTE — Progress Notes
Pt arrived for right abdominal drain removal. Removed chg dressing, clipped suture, pulled drainage tube and applied bandage.

## 2020-10-28 ENCOUNTER — Encounter: Admit: 2020-10-28 | Discharge: 2020-10-28 | Payer: BC Managed Care – PPO

## 2020-10-28 DIAGNOSIS — R5383 Other fatigue: Secondary | ICD-10-CM

## 2020-10-28 DIAGNOSIS — R12 Heartburn: Secondary | ICD-10-CM

## 2020-10-28 DIAGNOSIS — J302 Other seasonal allergic rhinitis: Secondary | ICD-10-CM

## 2020-10-28 DIAGNOSIS — Z9189 Other specified personal risk factors, not elsewhere classified: Secondary | ICD-10-CM

## 2020-10-28 DIAGNOSIS — E282 Polycystic ovarian syndrome: Secondary | ICD-10-CM

## 2020-10-28 DIAGNOSIS — C50911 Malignant neoplasm of unspecified site of right female breast: Secondary | ICD-10-CM

## 2020-10-28 DIAGNOSIS — Z923 Personal history of irradiation: Secondary | ICD-10-CM

## 2020-10-28 DIAGNOSIS — C50411 Malignant neoplasm of upper-outer quadrant of right female breast: Secondary | ICD-10-CM

## 2020-10-28 DIAGNOSIS — Z789 Other specified health status: Secondary | ICD-10-CM

## 2020-10-28 DIAGNOSIS — I10 Essential (primary) hypertension: Secondary | ICD-10-CM

## 2020-10-28 DIAGNOSIS — E8881 Metabolic syndrome: Secondary | ICD-10-CM

## 2020-10-28 DIAGNOSIS — C50919 Malignant neoplasm of unspecified site of unspecified female breast: Secondary | ICD-10-CM

## 2020-10-28 LAB — COMPREHENSIVE METABOLIC PANEL
ALBUMIN: 4 g/dL (ref 3.5–5.0)
ALK PHOSPHATASE: 44 U/L — ABNORMAL LOW (ref 25–110)
AST: 18 U/L (ref 7–40)
BLD UREA NITROGEN: 14 mg/dL (ref 7–25)
CALCIUM: 9.7 mg/dL — ABNORMAL HIGH (ref 8.5–10.6)
CHLORIDE: 105 MMOL/L — ABNORMAL LOW (ref 98–110)
CREATININE: 1.2 mg/dL — ABNORMAL HIGH (ref 0.4–1.00)
GLUCOSE,PANEL: 121 mg/dL — ABNORMAL HIGH (ref 70–100)
POTASSIUM: 3.7 MMOL/L — ABNORMAL LOW (ref 3.5–5.1)
SODIUM: 146 MMOL/L (ref 137–147)
TOTAL BILIRUBIN: 0.4 mg/dL (ref 0.3–1.2)
TOTAL PROTEIN: 7.3 g/dL (ref 6.0–8.0)

## 2020-10-28 LAB — CBC AND DIFF: WBC COUNT: 6.9 K/UL (ref 4.5–11.0)

## 2020-10-28 LAB — ESTRADIOL (E2): ESTRADIOL: <15 pg/mL

## 2020-10-28 NOTE — Progress Notes
Lymphedema Prevention Bioimpedance Spectroscopy (BIS) Monitoring    Notified patient via Phone result was elevated.     Reviewed BIS testing results from today.  Results:   RIGHT  Current: 15.3  Baseline: 2.4  Change from Baseline: 12.9    Elevated, greater than 3 standard deviation increase from baseline.    Called Jennifer Durham to discuss BIS elevated BIS results. Jennifer Durham denies experiencing heaviness, aching, fullness, or visible swelling. She states that she has been wearing her sleeve with activities. Sent MyChart message to review recommendations.

## 2020-10-28 NOTE — Progress Notes
Name: Jennifer Durham          MRN: [id number]      DOB: 02/08/1975      AGE: 45 y.o.   DATE OF SERVICE: 10/28/2020    Subjective:             Reason for Visit:  Follow Up      Jennifer Durham is a 45 y.o. female.     Cancer Staging  Malignant neoplasm of upper-outer quadrant of right breast in female, estrogen receptor positive (HCC)  Staging form: Breast, AJCC 8th Edition  - Clinical stage from 07/04/2019: Stage IA (cT1c, cN0(f), cM0, G2, ER+, PR+, HER2-) - Signed by Guy Begin, PA-C on 07/12/2019  - Pathologic stage from 08/21/2019: Stage IA (pT1c, pN1a, cM0, G2, ER+, PR+, HER2-) - Signed by Guy Begin, PA-C on 08/30/2019      History of Present Illness    DIAGNOSIS: right breast cancer   PAST ONCOLOGY HISTORY: Jennifer Durham is a 45 year old pre menopausal female who noted right breast dimpling in May 2021. She was sent for diagnostic imaging at an outside facility on 07/04/19. At that time a new right breast mass was seen as well as a lymph node with cortical thickening and biopsy was recommended. Right breast biopsy 07/04/2019 [name]) showed invasive carcinoma with mixed ductal and lobular features, grade II, ER 91-100%, PR 91-100%, HER2 1+ IHC. Right axilla biopsy showed fragments of polymorphous lymphoid tissue and no malignancy seen. Jennifer Durham underwent right modified radical mastectomy on 08/21/2019.  Tumor size was 1.8 cm, invasive ductal carcinoma.  Grade 2.  1 out of 34 lymph nodes was positive.  Jennifer size of LN metastasis was 5 mm.  ER was 96%, PR 99%,, HER-2 0+ by IHC.  Oncotype DX score 11. We recommended adjuvant adjuvant TC every 3 weeks x4 started 09/26/2019. She completed 4 cycles 11/28/2019.   ?  BREAST IMAGING:  Mammogram:    -- Bilateral diagnostic mammogram 07/04/19 [name]) revealed heterogeneously dense breast tissue. Spiculated mass with surrounding architectural distortion measuring up to 2 cm in Jennifer upper right medial breast at 10-11:00, 7.7 cm posterior to Jennifer nipple. Slightly prominent asymmetric right axillary lymph nodes.  -- Right diagnostic mammogram 07/16/19 ([facility]) revealed an Jennifer outside right MLO view from 07/04/2019, there was a small mass in Jennifer probable 9:00 position of Jennifer right breast at posterior depth, 2 cm inferior and 2 cm posterior to Jennifer known malignancy. Additional mammographic images will be performed to determine location. 2-D and 3-D images of Jennifer right breast were obtained.   In Jennifer 9:30 to 10:00 position of Jennifer right breast at middle to posterior depth, there was a 1.7 x 1.9 cm spiculated mass with internal tissue marker clip corresponding to biopsy-proven   malignancy. Jennifer biopsy-proven malignancy is approximately 8 cm posterior to Jennifer right nipple. In Jennifer 8:30 to 9:00 position of Jennifer right breast at posterior depth, there was an 0.8 cm lobular equal density mass which was not definitively seen on 2-D mammogram images from 03/21/2013.   ?  Ultrasound:    -- Right breast ultrasound 07/04/19 [facility]) revealed hypoechoic spiculated mass with surrounding architectural distortion with in Jennifer right upper breast at 10:00, 6 cm FTN. Significant posterior acoustic shadowing. Overall extent 1.3 x 1.7 x 1.6 cm. Increased cortical mantle thickening of a right axillary lymph node measuring up to 4 mm. BI-RADS 5.   -- Targeted right breast ultrasound 07/16/19 ([facility]) revealed at 9:30, 9  cm from Jennifer nipple, demonstrated a 1.4 x 1.8 x 1.6 cm irregular hypoechoic mass with internal tissue marker clip corresponding to Jennifer biopsy-proven malignancy. In Jennifer right breast at 9:00, 12 cm from Jennifer nipple, there ws an 0.8 cm morphologically normal small  intramammary lymph node corresponding to Jennifer additional mammographic mass identified. No suspicious right axillary lymph nodes were seen. 3 morphologically normal right axillary lymph nodes were identified. Jennifer lymph node labeled #3, likely contains Jennifer internal tissue marker clip from outside biopsy.     Jennifer Durham underwent right modified radical mastectomy on 08/21/2019.  Tumor size was 1.8 cm, invasive ductal carcinoma.  Grade 2.  1 out of 34 lymph nodes was positive.  Jennifer size of LN metastasis was 5 mm.  ER was 96%, PR 99%,, HER-2 0+ by IHC. Oncotype Dx RS 11.   She completed 4 cycles of adjuvant TC 11/28/2019.    She completed radiation with Dr Isabella Stalling. She is enrolled on Jennifer MA39 trail.       She completed radiation with Dr Isabella Stalling.            OB/GYN HISTORY: Age at onset of menstruation 13. G2P2. She had her first child at age 33. LMP 03/2016 due to Mirena placement. Uterus and ovaries intact.     PRESENT THERAPY: Tamoxifen 20mg  daily started 12/2019    Jennifer Durham is here today for continued follow up.      Review of Systems   Constitutional: Positive for fatigue.        + hot flashes    Endocrine: Positive for heat intolerance.   Genitourinary:        Pre-menopausal   LMP 03/2016  Copper IUD in place   Uterus and ovaries intact    Psychiatric/Behavioral: Positive for dysphoric mood. Jennifer Durham is nervous/anxious.        Allergies   Allergen Reactions   ? Tegaderm RASH       Objective:         ? aspirin 81 mg chewable tablet Chew one tablet by mouth daily. Take with food.   ? escitalopram oxalate (LEXAPRO) 10 mg tablet TAKE ONE TABLET BY MOUTH EVERY DAY   ? fluconazole (DIFLUCAN) 150 mg tablet Take one tablet by mouth every 7 days.   ? metFORMIN (GLUCOPHAGE) 1,000 mg tablet Take 1,000 mg by mouth twice daily with meals.   ? montelukast (SINGULAIR) 10 mg tablet Take 10 mg by mouth at bedtime daily.   ? ondansetron HCL (ZOFRAN) 4 mg tablet Take one tablet by mouth every 8 hours as needed for Nausea or Vomiting.   ? prochlorperazine maleate (COMPAZINE) 10 mg tablet Take one tablet by mouth every 6 hours as needed.   ? tamoxifen (NOLVADEX) 20 mg tablet Take one tablet by mouth daily. (Durham taking differently: Take 20 mg by mouth at bedtime daily.)   ? triamterene-hydrochlorothiazide (MAXZIDE) 75-50 mg tablet Take 1 tablet by mouth daily. Vitals:    10/28/20 1334   BP: (!) 134/91   BP Source: Arm, Left Upper   Pulse: 94   Temp: 36.4 ?C (97.5 ?F)   Resp: 21   SpO2: 100%   TempSrc: Temporal   PainSc: Zero   Weight: 135.4 kg (298 lb 9.6 oz)   Height: 181 cm (5' 11.26)     Body mass index is 41.34 kg/m?Marland Kitchen     Pain Score: Zero       Fatigue Scale: 5    Pain Addressed:  N/A  Durham Evaluated for a Clinical Trial: No treatment clinical trial available for this Durham.     [place] Cooperative Oncology Group performance status is 0, Fully active, able to carry on all pre-disease performance without restriction.Marland Kitchen     Physical Exam  Vitals and nursing note reviewed.   Pulmonary:      Effort: No respiratory distress.   Chest:   Breasts:      Right: No mass, skin change or tenderness.      Left: No mass, skin change or tenderness.         Lymphadenopathy:      Cervical: No cervical adenopathy.      Upper Body:      Right upper body: No supraclavicular or axillary adenopathy.      Left upper body: No supraclavicular or axillary adenopathy.       LABS:  Estradiol 04/07/2020: <15.0            Assessment and Plan:    1. 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   2.  Jennifer Durham underwent right modified radical mastectomy on 08/21/2019.  Tumor size was 1.8 cm, invasive ductal carcinoma.  Grade 2.  1 out of 34 lymph nodes was positive.  Jennifer size of LN metastasis was 5 mm.  ER was 96%, PR 99%,, HER-2 0+ by IHC. Oncotype Dx RS 11.   3.  Since Jennifer Durham is premenopausal and has node positive disease, we recommend adjuvant chemotherapy.  After discussing Jennifer options of AC-T chemotherapy versus TC chemotherapy, we decided to proceed with TC for four cycles.  We discussed Jennifer data based on ABC trials and breast cancer.  Jennifer Durham has significant history of heart disease in Jennifer family and is very concerned about Adriamycin causing heart damage.  Therefore TC seemed like better choice with equivalent efficacy based on trials.  She completed 4 cycles of TC 11/28/2019.    4. She completed radiation with Dr Isabella Stalling. She is enrolled on Jennifer MA39 trail.   5. Due to her cancer being ER+; we receommended endocrine therapy for 5-10 years. She started Tamoxifen 20mg  daily 12/2019. She was told Jennifer side effects including hot flashes, uterine cancer, blood clots and cataracts. She will continue to see GYN annually for pelvis exam. We will continue to check her E2 and if it is elevated then we will start ovarian function suppression.  She has a copper IUD.      6. Hot flashes and mood changes; continue Lexapro 10mg .  7. Fatigue; we will draw cbc, chem 12, TSH and iron panel today.   8. RTC in 6 months for a breast cancer follow-up visit and E2.      My collaborating MD Dr Raul Del.     For up-to-date information on COVID-19, visit Jennifer [facility] website. [email or web address]  ? General supportive care during cold and flu season and infection prevention reminders:   o Wash hands often with soap and water for at least 20 seconds or use hand sanitizer with at least 60% alcohol.   o Cover your mouth and nose.  o Practice social distancing: Try to maintain 6 feet between you and people outside your household.  o Avoid crowds and poorly ventilated spaces.  o Stay home if sick and symptoms are mild or manageable. If you must be around other people, wear a mask and practice social distancing.     ?  If you are having symptoms of a lower respiratory infection (cough, shortness of breath) and/or fever or have been exposed to someone with COVID-19:    o Call your primary care provider for questions or health needs.   - Tell your doctor about your recent travel and your symptoms          In a medical emergency, call 911 or go to Jennifer nearest emergency room.    Total Time Today was 30 minutes in Jennifer following activities: Preparing to see Jennifer Durham, Obtaining and/or reviewing separately obtained history, Performing a medically appropriate examination and/or evaluation, Counseling and educating Jennifer Durham/family/caregiver, Ordering medications, tests, or procedures and Documenting clinical information in Jennifer electronic or other health record    Myrla Halsted, APRN-NP

## 2020-10-30 ENCOUNTER — Encounter: Admit: 2020-10-30 | Discharge: 2020-10-30 | Payer: BC Managed Care – PPO

## 2020-10-31 ENCOUNTER — Encounter: Admit: 2020-10-31 | Discharge: 2020-10-31 | Payer: BC Managed Care – PPO

## 2020-10-31 NOTE — Progress Notes
Notified Whitnie that Colletta Maryland would like her to follow up with her primary care regarding lab results from [facility] -    Lab reports faxed to Dr Jaclyn Shaggy    Fax  [phone]  Phone [phone]

## 2020-11-08 ENCOUNTER — Encounter: Admit: 2020-11-08 | Discharge: 2020-11-08 | Payer: BC Managed Care – PPO

## 2020-11-08 MED ORDER — ESCITALOPRAM OXALATE 10 MG PO TAB
ORAL_TABLET | Freq: Every day | 1 refills
Start: 2020-11-08 — End: ?

## 2021-01-11 ENCOUNTER — Encounter: Admit: 2021-01-11 | Discharge: 2021-01-11 | Payer: BC Managed Care – PPO

## 2021-01-11 MED ORDER — ESCITALOPRAM OXALATE 10 MG PO TAB
ORAL_TABLET | Freq: Every day | 1 refills
Start: 2021-01-11 — End: ?

## 2021-01-16 ENCOUNTER — Encounter: Admit: 2021-01-16 | Discharge: 2021-01-16 | Payer: BC Managed Care – PPO

## 2021-01-16 MED ORDER — TAMOXIFEN 20 MG PO TAB
20 mg | ORAL_TABLET | Freq: Every evening | ORAL | 3 refills | Status: AC
Start: 2021-01-16 — End: ?

## 2021-01-22 ENCOUNTER — Encounter: Admit: 2021-01-22 | Discharge: 2021-01-22 | Payer: BC Managed Care – PPO

## 2021-01-22 ENCOUNTER — Ambulatory Visit: Admit: 2021-01-22 | Discharge: 2021-01-22 | Payer: BC Managed Care – PPO

## 2021-01-22 DIAGNOSIS — R12 Heartburn: Secondary | ICD-10-CM

## 2021-01-22 DIAGNOSIS — E039 Hypothyroidism, unspecified: Secondary | ICD-10-CM

## 2021-01-22 DIAGNOSIS — E282 Polycystic ovarian syndrome: Secondary | ICD-10-CM

## 2021-01-22 DIAGNOSIS — C50919 Malignant neoplasm of unspecified site of unspecified female breast: Secondary | ICD-10-CM

## 2021-01-22 DIAGNOSIS — Z923 Personal history of irradiation: Secondary | ICD-10-CM

## 2021-01-22 DIAGNOSIS — Z9889 Other specified postprocedural states: Secondary | ICD-10-CM

## 2021-01-22 DIAGNOSIS — Z789 Other specified health status: Secondary | ICD-10-CM

## 2021-01-22 DIAGNOSIS — C50411 Malignant neoplasm of upper-outer quadrant of right female breast: Secondary | ICD-10-CM

## 2021-01-22 DIAGNOSIS — I10 Essential (primary) hypertension: Secondary | ICD-10-CM

## 2021-01-22 DIAGNOSIS — N650 Deformity of reconstructed breast: Secondary | ICD-10-CM

## 2021-01-22 DIAGNOSIS — E8881 Metabolic syndrome: Secondary | ICD-10-CM

## 2021-01-22 DIAGNOSIS — J302 Other seasonal allergic rhinitis: Secondary | ICD-10-CM

## 2021-01-22 DIAGNOSIS — T7840XA Allergy, unspecified, initial encounter: Secondary | ICD-10-CM

## 2021-01-22 DIAGNOSIS — F419 Anxiety disorder, unspecified: Secondary | ICD-10-CM

## 2021-01-22 NOTE — Progress Notes
Subjective:       History of Present Illness  Jennifer Durham is a 45 y.o. female.  Cancer Staging  Malignant neoplasm of upper-outer quadrant of right breast in female, estrogen receptor positive (HCC)  Staging form: Breast, AJCC 8th Edition  - Clinical stage from 07/04/2019: Stage IA (cT1c, cN0(f), cM0, G2, ER+, PR+, HER2-) - Signed by Guy Begin, PA-C on 07/12/2019  - Pathologic stage from 08/21/2019: Stage IA (pT1c, pN1a, cM0, G2, ER+, PR+, HER2-) - Signed by Guy Begin, PA-C on 08/30/2019    Patient returns to clinic to discuss revisions. She is overall doing well and has no concerns. She notes an asymmetry of her reconstructed breasts and excess abdominal skin due to her DIEP flaps. She notes difficulty with clothing fitting properly. She notes the right side of her abdomen is tender at times.   Allergies   Allergen Reactions   ? Tegaderm RASH        Review of Systems   Constitutional: Negative.    HENT: Negative.    Eyes: Negative.    Respiratory: Negative.    Cardiovascular: Negative.    Gastrointestinal: Negative.    Endocrine: Negative.    Genitourinary: Negative.    Musculoskeletal: Negative.    Skin: Negative.    Allergic/Immunologic: Negative.    Neurological: Negative.    Hematological: Negative.    Psychiatric/Behavioral: Negative.      Medical History:   Diagnosis Date   ? Allergy    ? Anxiety disorder    ? Breast cancer (HCC) 07/04/2019    right   ? Heartburn    ? Hx of radiation therapy 2022   ? Hypertension    ? Hypothyroidism 09/2020   ? Insulin resistance    ? Limb alert care status     No venipuncture/BP's to RUE   ? PCOS (polycystic ovarian syndrome)    ? Seasonal allergies     Frequent sinusitis     Surgical History:   Procedure Laterality Date   ? HX TONSILLECTOMY  2001    turbinates/adenoids 2015   ? HX TONSIL AND ADENOIDECTOMY  2011   ? LEFT PERONEAL TENDON REPAIR Left 12/15/2016    Performed by Vopat, Lowry Ram, MD at [facility]   ? Left Skin Sparing Mastectomy Left 08/21/2019    Performed by Renford Dills, MD at [facility]   ? IDENTIFICATION SENTINEL LYMPH NODE Right 08/21/2019    Performed by Renford Dills, MD at [facility]   ? INJECTION RADIOACTIVE TRACER FOR SENTINEL NODE IDENTIFICATION Right 08/21/2019    Performed by Renford Dills, MD at [facility]   ? RADIOLOGICAL EXAM SURGICAL SPECIMEN Right 08/21/2019    Performed by Renford Dills, MD at [facility]   ? MASTECTOMY - MODIFIED RADICAL WITH AXILLARY LYMPH NODE DISSECTION Right 08/21/2019    Performed by Renford Dills, MD at [facility]   ? RECONSTRUCTION BREAST WITH TISSUE EXPANDER AND SUBSEQUENT EXPANSION - IMMEDIATE/ DELAYED Bilateral 08/21/2019    Performed by Marlinda Mike, MD at [facility]   ? IMPLANTATION BIOLOGIC IMPLANT FOR SOFT TISSUE REINFORCEMENT Bilateral 08/21/2019    Performed by Marlinda Mike, MD at [facility]   ? RECONSTRUCTION BREAST WITH DEEP INFERIOR EPIGASTRIC PERFORATOR FLAP/ SUPERFICIAL INFERIOR EPIGASTRIC ARTERY FLAP Bilateral 07/02/2020    Performed by Marlinda Mike, MD at [facility] OR   ? RECONSTRUCTION BREAST WITH FREE FLAP - request 22 modifier, complicated procedure due to obesity  and intramuscular course Bilateral 07/02/2020    Performed by Marlinda Mike, MD at [facility] OR   ? INTRAVENOUS INJECTION AGENT TO TEST VASCULAR FLOW IN FLAP/ GRAFT Bilateral 07/02/2020    Performed by Marlinda Mike, MD at [facility] OR   ? APPLICATION NEGATIVE PRESSURE WOUND THERAPY Bilateral 07/02/2020    Performed by Marlinda Mike, MD at [facility] OR   ? IMPLANTATION MESH CLOSURE OF DEBRIDEMENT FOR NECROTIZING SOFT TISSUE INFECTION Bilateral 07/02/2020    Performed by Marlinda Mike, MD at [facility] OR   ? REMOVAL TISSUE EXPANDER Bilateral 07/02/2020    Performed by Marlinda Mike, MD at [facility] OR   ? INCISION AND DRAINAGE HEMATOMA/ SEROMA - TORSO N/A 08/05/2020    Performed by Marlinda Mike, MD at [facility] OR   ? REPAIR INTERMEDIATE WOUND 12.6 CM TO 20.0 CM - TRUNK - total length 24cm  08/05/2020    Performed by Marlinda Mike, MD at [facility] OR     Family History   Problem Relation Age of Onset   ? Heart Attack Mother    ? Cancer Mother    ? Diabetes Mother    ? Cancer-Breast Mother 50   ? Heart Disease Mother    ? Hypertension Mother    ? Heart Disease Father    ? Hypothyroid Father    ? Hypertension Father    ? Thyroid Disease Father    ? Hypertension Brother    ? Thyroid Disease Brother    ? Cancer-Breast Maternal Aunt 50   ? Cancer-Ovarian Maternal Aunt 50   ? Diabetes Maternal Aunt    ? Arthritis-rheumatoid Maternal Aunt    ? Diabetes Maternal Grandmother    ? Heart Disease Maternal Grandfather    ? Thyroid Disease Brother      Social History     Socioeconomic History   ? Marital status: Married   Tobacco Use   ? Smoking status: Never   ? Smokeless tobacco: Never   Vaping Use   ? Vaping Use: Never used   Substance and Sexual Activity   ? Alcohol use: Yes     Alcohol/week: 2.0 standard drinks     Types: 2 Drinks containing 0.5 oz of alcohol per week     Comment: seldom   ? Drug use: No   ? Sexual activity: Yes     Partners: Male     Birth control/protection: I.U.D.     Vaping/E-liquid Use   ? Vaping Use Never User                   Objective:         ? escitalopram oxalate (LEXAPRO) 10 mg tablet TAKE ONE TABLET BY MOUTH EVERY DAY   ? levothyroxine (SYNTHROID) 50 mcg tablet Take 50 mcg by mouth daily.   ? metFORMIN (GLUCOPHAGE) 1,000 mg tablet Take 1,000 mg by mouth twice daily with meals.   ? montelukast (SINGULAIR) 10 mg tablet Take 10 mg by mouth at bedtime daily.   ? tamoxifen (NOLVADEX) 20 mg tablet Take one tablet by mouth at bedtime daily.   ? triamterene-hydrochlorothiazide (MAXZIDE) 75-50 mg tablet Take 1 tablet by mouth daily.     Vitals:    01/22/21 1131   BP: 114/83   Pulse: 105   Temp: 36.4 ?C (97.6 ?F)   TempSrc: Skin   PainSc: Zero   Weight: 131.5 kg (290 lb)   Height: 185.4 cm (6' 1)  Body mass index is 38.26 kg/m?Marland Kitchen     Physical Exam  Vitals reviewed.   Constitutional:       Appearance: Normal appearance. She is well-groomed.   HENT:      Head: Normocephalic.   Pulmonary:      Effort: Pulmonary effort is normal.   Chest:      Comments: Bilateral breast DIEPs well healed. Left breast is larger and lower due to right breast radiation.   Abdominal:      Palpations: Abdomen is soft.      Tenderness: There is abdominal tenderness.      Comments: Bilateral abdominal dogears presents. Right side tender to palpation.    Skin:     General: Skin is warm and dry.   Neurological:      Mental Status: She is alert.   Psychiatric:         Mood and Affect: Mood normal.         Behavior: Behavior normal. Behavior is cooperative.         Thought Content: Thought content normal.         Judgment: Judgment normal.          Assessment and Plan:  Patient s/p bilateral breast reconstruction with DIEP flaps.      Plan  1) Fat grafting to bilateral reconstructed breasts  2) Revision of left reconstructed breast - lift left breast and reduce to match right breast  3) Excision of abdominal dogears.  Will coordinate OR date

## 2021-01-28 ENCOUNTER — Encounter: Admit: 2021-01-28 | Discharge: 2021-01-28 | Payer: BC Managed Care – PPO

## 2021-01-28 ENCOUNTER — Ambulatory Visit: Admit: 2021-01-28 | Discharge: 2021-01-28 | Payer: BC Managed Care – PPO

## 2021-01-28 DIAGNOSIS — C50411 Malignant neoplasm of upper-outer quadrant of right female breast: Secondary | ICD-10-CM

## 2021-01-29 ENCOUNTER — Encounter: Admit: 2021-01-29 | Discharge: 2021-01-29 | Payer: BC Managed Care – PPO

## 2021-01-29 ENCOUNTER — Ambulatory Visit: Admit: 2021-01-29 | Discharge: 2021-01-29 | Payer: BC Managed Care – PPO

## 2021-01-29 DIAGNOSIS — J302 Other seasonal allergic rhinitis: Secondary | ICD-10-CM

## 2021-01-29 DIAGNOSIS — E039 Hypothyroidism, unspecified: Secondary | ICD-10-CM

## 2021-01-29 DIAGNOSIS — E282 Polycystic ovarian syndrome: Secondary | ICD-10-CM

## 2021-01-29 DIAGNOSIS — C50411 Malignant neoplasm of upper-outer quadrant of right female breast: Secondary | ICD-10-CM

## 2021-01-29 DIAGNOSIS — E8881 Metabolic syndrome: Secondary | ICD-10-CM

## 2021-01-29 DIAGNOSIS — F419 Anxiety disorder, unspecified: Secondary | ICD-10-CM

## 2021-01-29 DIAGNOSIS — I10 Essential (primary) hypertension: Secondary | ICD-10-CM

## 2021-01-29 DIAGNOSIS — C50919 Malignant neoplasm of unspecified site of unspecified female breast: Secondary | ICD-10-CM

## 2021-01-29 DIAGNOSIS — Z923 Personal history of irradiation: Secondary | ICD-10-CM

## 2021-01-29 DIAGNOSIS — Z789 Other specified health status: Secondary | ICD-10-CM

## 2021-01-29 DIAGNOSIS — R12 Heartburn: Secondary | ICD-10-CM

## 2021-01-29 DIAGNOSIS — T7840XA Allergy, unspecified, initial encounter: Secondary | ICD-10-CM

## 2021-01-29 NOTE — Research Notes
Study Title:Tailor RT: A Randomized Trial of Regional Radiotherapy in Biomarker Low Risk Node Positive and T3N0 Breast Cancer  HSC: [id number]  Treatment ARM: 2 Regional RT    Participant presents for 1 year follow up visit. Participant completed QOL's and assessments were done per protocol.      Participant reviewed solicited and unsolicited adverse events and concomitant medications with investigator and coordinator during this visit. Participant reports willingness to continue with research protocol. Participant denies having any recent admissions to the hospital since their last study visit. Provider completed physical exam. See the provider note and additional chart documentation for assessment of AEs. Grading, attributions, and expectedness for adverse events were determined by provider.   Solicited adverse events collected at this visit were as follows:  1)  Breast pain - none  2)  Chest pain -cardiac- none  3)  Chest wall pain - none  4)  Fatigue: none  5)  Lymphedema- arm: 1  6)  Myocardial Infarction - none  7)  Pericarditis -none  8)  Pneumonitis -none  9)   Telangieactasia -none  10)  Joint range of motion decreased: decrease range of motion on R arm  11) Skin induration - none  12) Dermatitis Radiation - none  13) Brachial plexopathy -none  Participant meets treatment parameters and will proceed with study treatment.   Next scheduled visit is 24 months on 01/29/2022.    Participant will not have a bilateral mammogram or breast MRI due to bilateral mastectomy. Will have ultrasound of the upper outer left breast just to confirm fat necrosis versus scar tissue so that it can be followed for stability purposes in the future.    Rondel Jumbo  [phone]

## 2021-01-29 NOTE — Progress Notes
Radiation Oncology Follow Up Note  Date: 01/29/2021       Jennifer Durham is a 46 y.o. female.     The encounter diagnosis was Malignant neoplasm of upper-outer quadrant of right breast in female, estrogen receptor positive (HCC).  Staging: Cancer Staging  Malignant neoplasm of upper-outer quadrant of right breast in female, estrogen receptor positive (HCC)  Staging form: Breast, AJCC 8th Edition  - Clinical stage from 07/04/2019: Stage IA (cT1c, cN0(f), cM0, G2, ER+, PR+, HER2-) - Signed by Guy Begin, PA-C on 07/12/2019  - Pathologic stage from 08/21/2019: Stage IA (pT1c, pN1a, cM0, G2, ER+, PR+, HER2-) - Signed by Guy Begin, PA-C on 08/30/2019        History of Present Illness  Jennifer Durham?was diagnosed with?Stage IA (cT1c, cN0(f), cM0, G2, ER+, PR+, HER2-).??Stage IA (pT1c, pN1a, cM0, G2, ER+, PR+, HER2-) at age 18 years.  I personally reviewed?her?previous medical records during imaging, pathology, and other provider records,?to obtain some of this information.?Her?oncologic history is?briefly?as follows:  ?  The patient is a 46 year old female?with family history of breast cancer in her mother and maternal aunts.??She reports that she first?noticed some dimpling/changes in the right breast in May 2021.??She had already been scheduled to have a screening mammogram at that time and it was converted to a bilateral diagnostic mammogram.the diagnostic mammogram?was performed in [place], [place], on July 04, 2019.??It identified a spiculated mass with architectural distortion measuring up to 2 cm in the upper medial right breast, 10-11 o'clock, 7.7 cm posterior to the nipple there were also noted to be some slightly prominent asymmetric right axillary lymph nodes. ?On same-day ultrasound, the tumor was measured at 1.3 x 1.7 x 1.6 cm. ?There was increased cortical mantle thickening of the right axillary lymph node measuring up to 4 mm. ?BI-RADS 5. ?A right breast and right axillary biopsy performed in [place] on July 04, 2019 carcinoma. ?Outside path review was performed at [facility] ?which confirmed grade 2 IDC with a focal lobular growth pattern and lymphoid tissue with no evidence of carcinoma in the right axillary specimen. ?She was seen by Dr. Robina Ade, and Dr. Raul Del, in the [facility] on July 16, 2019 for evaluation. ?New imaging was performed the same day at [facility]. ?It revealed a small mass in the probable 9 o'clock position right breast posterior depth, 2 cm inferior and 2 cm posterior to the known malignancy. ?In the 930 to 10 o'clock position of the right breast middle to posterior depth a 1.7 x 1.9 cm spiculated mass with internal tissue marker clip corresponding to biopsy-proven malignancy. ?In the 830 to 9 o'clock position of the right breast at posterior depth there was a 0.8 cm lobular equal density mass which is not definitively seen on the 2D mammogram images from [facility] 25th 2015. ?Targeted ultrasound at [facility] on July 16, 2019 showed a 1.4 x 1.8 x 1.6 cm irregular hypoechoic mass at the 930 location 9 cm from the nipple with internal tissue marker clip. ?It also noted in the right breast 9:00, 12 cm from the nipple, a 0.8 cm morphologically normal small intramammary lymph node corresponding to the additional mammographic mass identified. ?No suspicious right axillary lymph nodes were seen. ?She was recommended for upfront surgery. ?MRI of the bilateral breasts and genetic testing were ordered.  -July 16, 2019?[facility] genetic testing did not identify a clinically significant mutation.  -07/20/2019 MRI bilateral breasts confirmed the presence of an irregular spiculated mass in the right upper  outer breast compatible with previous biopsy of malignancy and no enlarged or morphologically suspicious lymph nodes.  -On August 21, 2019, the patient underwent bilateral skin sparing mastectomy and right sentinel lymph node biopsy, axilla lymph node dissection, and reconstruction. 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 ?A 5 mm focus of metastatic carcinoma was found in 1 of 4 sentinel lymph nodes. ?0 of 27 additional axillary nodes were involved. pT1c N1a M0 (1 of 34 LN+) M0. No LVI, No EIC, No ENE.  ?  -Oncotype DX score 11.?Dr. Raul Del recommended adjuvant chemotherapy to consist of?TC every 3 weeks x4 started 09/26/2019. ?She completed 4 cycles 11/28/2019.?  ?  -She was enrolled in the following clinical trial:  MA.39?([id number])?Tailor RT: A randomized trial of regional radiotherapy and biomarker low risk node positive breast cancer. ?To compare the breast cancer recurrence-free interval?(BCRFI)?between patients that received regional RT or not, defined as time from randomization to time of invasive recurrent disease in the ipsilateral chest wall, breast, regional nodes, distant sites or death due to breast cancer.  ?  She was randomized to the treatment arm and received adjuvant radiation therapy to the right reconstructed breast, chest wall, and regional nodes to a total of 4256 cGy in sixteen treatments. ?A brief description of her radiation treatment course is provided here:  ?  Treatment start date: 02/19/2020  Complete date: 03/11/2020  Dose per fraction: 2.66 Gray  Number fractions:?16  Total dose: 4256 Gray  Number of elapsed days:?16  ?     Review of Systems   Constitutional: Positive for appetite change and fatigue.   HENT: Negative.    Eyes: Negative.    Respiratory: Negative.    Cardiovascular: Negative.    Gastrointestinal: Positive for nausea and vomiting.   Endocrine: Negative.    Genitourinary: Negative.    Musculoskeletal: Positive for myalgias.   Skin: Positive for wound.   Allergic/Immunologic: Negative.    Neurological: Negative.    Hematological: Negative.    Psychiatric/Behavioral: The patient is nervous/anxious.          Subjective: 01/29/2021  Jennifer Durham is seen today for routine follow up.  She completed adjuvant radiation therapy for her right breast cancer on March 11, 2020.  She enrolled in the following clinical trial: MA.39?([id number])?Tailor RT: A randomized trial of regional radiotherapy and biomarker low risk node positive breast cancer. ?To compare the breast cancer recurrence-free interval?(BCRFI)?between patients that received regional RT or not, defined as time from randomization to time of invasive recurrent disease in the ipsilateral chest wall, breast, regional nodes, distant sites or death due to breast cancer.  She was randomized to the treatment arm and received adjuvant radiation therapy to the right reconstructed breast, chest wall, and regional nodes to a total of 4256 cGy in sixteen treatments.  On evaluation today, she reports that she is doing generally well.  She has noted an area of firm lumpiness in the left upper outer breast that she believes is likely scar tissue or fat necrosis from her Diep flap reconstruction.  She says its not causing her any trouble nor is it problematic for her.  She reports stability of the symmetry of the breast bilaterally with the left breast larger than the right.  No new lumps, bumps, nodules or areas of concern.  She has follow-up appointments with medical oncology in April 2023, she will see the surgeons clinic in May 2023, and follow-up with  the plastic surgeon in July 2023.  No new issues or concerns to report.  She does have occasional stabbing pains within right axilla but it is rare and not problematic.    07/31/2020       In the interim, she has undergone her reconstruction with Dr. Fran Lowes.  Her surgery date was 07/02/2020.  Over the last week or so she has had some issues with the drain on her right hip working its way out and it did come out completely yesterday.  It began hurting and she started developing fevers on Monday of this week.  She has been started on antibiotics and will be following up with him in the near future.  She does still have the drain in the left hip.  She reports that she has been healing up pretty well otherwise and is very pleased with the cosmetic appearance and symmetry.  She denies any other issues or concerns.  She reports no breast pain, no cardiac related chest pain, no chest wall pain.  She does report that overall she has been feeling very good since the surgery.  She does have a little bit of fatigue this week that she thinks is related to the fevers and not sleeping well.  She reports some mild edema at the top of her right arm and occasionally in her right hand but overall no significant lymphedema.  No history of MI or pericarditis.  No pneumonitis symptoms.  No telangiectasias of the skin or skin changes.  He reports a little bit of tightness in the right upper extremity joint since her surgery.  No numbness or tingling in the upper extremity or neck no induration or radiation changes in the skin tissues.    Objective:         ? escitalopram oxalate (LEXAPRO) 10 mg tablet TAKE ONE TABLET BY MOUTH EVERY DAY   ? levothyroxine (SYNTHROID) 50 mcg tablet Take 50 mcg by mouth daily.   ? metFORMIN (GLUCOPHAGE) 1,000 mg tablet Take 1,000 mg by mouth twice daily with meals.   ? montelukast (SINGULAIR) 10 mg tablet Take 10 mg by mouth at bedtime daily.   ? tamoxifen (NOLVADEX) 20 mg tablet Take one tablet by mouth at bedtime daily.   ? triamterene-hydrochlorothiazide (MAXZIDE) 75-50 mg tablet Take 1 tablet by mouth daily.     Vitals:    01/29/21 0820 01/29/21 0827   BP: 133/80    BP Source: Arm, Left Upper    Pulse: 97    Temp: 36.2 ?C (97.2 ?F)    Resp: 18    SpO2: 100%    PainSc:  Zero   Weight: 134.6 kg (296 lb 12.8 oz)      Body mass index is 39.16 kg/m?Marland Kitchen     Pain Score: Zero        Fatigue Scale: 0-None    KARNOFSKY PERFORMANCE SCORE:  100% Normal, no complaints     Physical Exam  Vitals and nursing note reviewed.   Constitutional:       General: She is not in acute distress.     Appearance: Normal appearance. She is obese. She is not ill-appearing.   HENT:      Head: Normocephalic and atraumatic.   Eyes:      Extraocular Movements: Extraocular movements intact.      Pupils: Pupils are equal, round, and reactive to light.   Cardiovascular:      Rate and Rhythm: Normal rate and regular rhythm.  Pulses: Normal pulses.   Pulmonary:      Effort: Pulmonary effort is normal. No respiratory distress.      Breath sounds: Normal breath sounds.   Chest:   Breasts:     Right: Absent.      Left: Absent.          Comments: Patient is status post bilateral breast reconstruction with autologous tissue transplant/Diep flap.  There is mild hyperpigmentation of the skin over the right reconstructed breast.  No discrete nodularity appreciated in the grafts are intact and well-healed.  Area mentioned by the patient in the upper outer left reconstructed breast likely fat necrosis or scar tissue.  No discrete nodularity appreciated  Abdominal:      Palpations: Abdomen is soft.   Musculoskeletal:      Cervical back: Normal range of motion. No rigidity.      Comments: No obvious lymphedema in either extremity.  Mild limitation of the right upper extremity range of motion   Lymphadenopathy:      Cervical: No cervical adenopathy.      Upper Body:      Right upper body: No supraclavicular, axillary or pectoral adenopathy.      Left upper body: No supraclavicular, axillary or pectoral adenopathy.   Skin:     General: Skin is warm and dry.   Neurological:      General: No focal deficit present.      Mental Status: She is alert and oriented to person, place, and time. Mental status is at baseline.      Cranial Nerves: No cranial nerve deficit.   Psychiatric:         Mood and Affect: Mood normal.         Behavior: Behavior normal.         Thought Content: Thought content normal.         Judgment: Judgment normal.              Laboratory:    Comprehensive Metabolic Profile    Lab Results Component Value Date/Time    NA 146 10/28/2020 02:28 PM    K 3.7 10/28/2020 02:28 PM    CL 105 10/28/2020 02:28 PM    CO2 30 10/28/2020 02:28 PM    GAP 11 10/28/2020 02:28 PM    BUN 14 10/28/2020 02:28 PM    CR 1.22 (H) 10/28/2020 02:28 PM    GLU 121 (H) 10/28/2020 02:28 PM    Lab Results   Component Value Date/Time    CA 9.7 10/28/2020 02:28 PM    ALBUMIN 4.0 10/28/2020 02:28 PM    TOTPROT 7.3 10/28/2020 02:28 PM    ALKPHOS 44 10/28/2020 02:28 PM    AST 18 10/28/2020 02:28 PM    ALT 11 10/28/2020 02:28 PM    TOTBILI 0.4 10/28/2020 02:28 PM    GFR 57 (L) 11/28/2019 08:10 AM    GFRAA >60 11/28/2019 08:10 AM        CBC w diff    Lab Results   Component Value Date/Time    WBC 6.9 10/28/2020 02:28 PM    RBC 4.08 10/28/2020 02:28 PM    HGB 11.3 (L) 10/28/2020 02:28 PM    HCT 35.3 (L) 10/28/2020 02:28 PM    MCV 86.5 10/28/2020 02:28 PM    MCH 27.6 10/28/2020 02:28 PM    MCHC 31.9 (L) 10/28/2020 02:28 PM    RDW 18.2 (H) 10/28/2020 02:28 PM    PLTCT 353 10/28/2020 02:28 PM  MPV 7.0 10/28/2020 02:28 PM    Lab Results   Component Value Date/Time    NEUT 69 10/28/2020 02:28 PM    ANC 4.80 10/28/2020 02:28 PM    LYMA 16 (L) 10/28/2020 02:28 PM    ALC 1.10 10/28/2020 02:28 PM    MONA 6 10/28/2020 02:28 PM    AMC 0.40 10/28/2020 02:28 PM    EOSA 8 (H) 10/28/2020 02:28 PM    AEC 0.60 (H) 10/28/2020 02:28 PM    BASA 1 10/28/2020 02:28 PM    ABC 0.10 10/28/2020 02:28 PM            Imaging:   US PELVIS NON OB LIMITED  Narrative: FOCUSED ULTRASOUND OF THE LOWER ABDOMINAL WALL    CLINICAL INDICATION: 46 year old female, bilateral mastectomy status post bilateral breast reconstruction with flap graft. Persistent fluid in the lower abdominal wall. Evaluate for possible aspiration/drainage.    TECHNIQUE: Focused grayscale and color Doppler imaging was performed of lower abdominal wall in the area of interest as directed by the patient.    COMPARISON: CTA abdomen/pelvis 04/21/20     FINDINGS:    There are two surgical drainage catheters, one within the right lower quadrant and the other within the left lower quadrant. Minimal thin, nonencapsulated fluid tracks along the superficial muscular fascia. Both of the surgical drains lie within this minimal fluid collection.    Moderate subcutaneous edema along the visualized lower ventral abdominal wall. No organizing fluid collection is visualized. There is no hypervascularity within the right lower quadrant region on color Doppler interrogation.  Impression: 1.  Moderate subcutaneous edema throughout the lower ventral abdominal wall.   2.  Minimal postoperative fluid along the superficial muscular fascia, associated with the surgical drainage catheters. No organized fluid collections to target for additional drainage.    By my electronic signature, I attest that I have personally reviewed the images for this examination and formulated the interpretations and opinions expressed in this report     Finalized by Judie Petit, M.D. on 07/30/2020 3:10 PM. Dictated by Dion Body, M.D. on 07/30/2020 1:58 PM.       Path:  PATHOLOGY REPORT   Date Value Ref Range Status   08/21/2019   Final    [facility]  [email or web address]    Department of Pathology and Laboratory Medicine  [address] [place], [place] [address]  Surgical Pathology Office:  [phone]  Fax:  [phone]  SURGICAL PATHOLOGY REPORT    NAME: Jennifer, Durham PATH #: [id number] MR #: [id number] SPECIMEN  CLASS: SI BILLING #: [id number] ALT ID #:  LOCATION: [facility] DATE OF  PROCEDURE: 08/21/2019 AGE:  43 SEX: F DATE RECEIVED: 08/21/2019 DOB:  10/12/1975  TIME RECEIVED:  11:32 PHYSICIAN: LYNDSEY J KILGORE DATE OF  REPORT: 08/24/2019 COPY TO: ERIC C LAI, MD DATE OF PRINTING: 08/24/2019   Procedures/Addenda  Addendum     Date Ordered:     08/24/2019     Status:  Signed Out  Date Complete:     08/24/2019  By: Sol Blazing, MD  Date Reported:     08/24/2019             Addendum Comment  Breast Cancer Prognostic Panel by Immunohistochemistry (IHC) using  Quantitative Computer-Assisted Image Analysis    Testing performed on block Number: A13  Estrogen Receptor (ER):          96%     Positive     Stain Intensity:  Strong  Progesterone Receptor (PR):     99%     Positive     Stain Intensity:  Strong  Her2:                    0     Negative    The original image analysis report is scanned in Epic under Results  Review.    Disclaimer: The [facility] is certified  under the Clinical Laboratory Improvement Amendments of 1988 (CLIA) to  perform high complexity clinical laboratory testing. The Food and Drug  Administration (FDA) cleared estrogen receptor/progesterone receptor  (ER/PR) assays and the FDA approved Her2 assay are performed on  formalin-fixed, paraffin-embedded tissue sections using the Ventana  Benchmark Ultraview Universal DAB detection kit with Ventana Confirm ER  antibody (clone [id number]), Ventana Confirm PR antibody (clone [id number]), and Ventana  Pathway Her2/neu antibody (clone [id number]). Ki-67 is performed using Dako Flex  Ki-67 antibody (clone MIB-1) with Dako Flex Dab detection kit on  autostainer Link 48. Interpretations of the ER/PR and Her2 results follow  the most current American Society of Clinical Oncology/College of American  Pathologists (ASCO/CAP) guidelines. Estrogen receptor and progesterone  receptor studies are reported as a percentage of positive tumor nuclei  that express immunoreactivity and intensity of staining. For breast  cancer, estrogen receptor and progesterone receptor positivity 1% or  greater is considered positive. HER-2/neu that demonstrates incomplete  membrane staining that is faint/barely perceptible and within d10% of  tumor cells is scored as 0 and within >10% of tumor cells is scored as 1+.  A score of 0 or 1+ is considered negative. Weak to moderate complete  membrane staining in >10% of tumor cells or complete membrane staining  that is intense but within d10% of tumor cells is scored as 2+ equivocal  and will be reflexed to FISH. A score of 3+ requires strong and complete  membrane staining in greater than 10% of tumor cells and is considered  positive. Known positive and negative control tissues show appropriate  staining. Cold ischemic time and formalin fixation time of the tissue meet  the ASCO/CAP requirements unless noted in the pathology report. This assay  has not been validated on decalcified tissues. The image analysis is  performed using Ventana Virtuoso Image Analysis system (v5.6.2). ER  Negative, Low Positive, and Weak staining results are adjudicated  according to the steps in the Standard Operating Procedure of the [facility].                                                         ########################################################################  Final Diagnosis:    A. Breast, right breast skin sparing mastectomy, short stitch superior,  long stitch lateral, mastectomy:  Invasive ductal carcinoma, histologic grade 2, see comment.  Ductal carcinoma, nuclear grade 2, solid and cribriform patterns.  Fibroadenoma.       B. Lymph nodes, right axillary sentinel lymph node #1, biopsy:    There is no evidence of malignancy in two lymph nodes.  (0/2)  Deeper sections and a pancytokeratin immunostain are negative in  support of the above diagnosis.      C. Lymph node, right axillary sentinel lymph node #2, biopsy:    There is no evidence of malignancy in one lymph  node.  (0/1)  Deeper sections and a pancytokeratin immunostain are negative in  support of the above diagnosis.      D. Lymph node, right axillary sentinel lymph node #3, biopsy:    Metastatic carcinoma in one lymph node (1/1), 5 mm in greatest  dimension.  Deeper sections and a pancytokeratin immunostain are positive in  support of the above diagnosis.      E. Lymph node, right axillary sentinel lymph node #4:    There is no evidence of malignancy in one lymph node. (0/1)  Deeper sections and a pancytokeratin immunostain are negative in  support of the above diagnosis.      F. Lymph node, right axillary sentinel lymph node #5, biopsy:    There is no evidence of malignancy in one lymph node.  (0/1)  Deeper sections and a pancytokeratin immunostain are negative in  support of the above diagnosis.      G. Fibroadipose tissue, right additional anterior lateral margin clip  marks new true margin, excision:    Unremarkable fibroadipose tissue.    H. Lymph node, right low axillary lymph node, excision:    There is no evidence of malignancy in one lymph node.  (0/1)    I. Breast, left breast skin sparing mastectomy, short stitch superior,  long stitch lateral, mastectomy:    Fibrocystic changes.  There is no evidence of malignancy.      J. Lymph nodes, right axillary contents level I and II, axillary  dissection:    Negative for malignancy in twenty-seven lymph nodes (0/27).  Biopsy site present in one lymph node.              K. Skin, right mastectomy skin, excision:  Dermal fibrosis.    Comment:  INVASIVE CARCINOMA OF THE BREAST: RESECTION  CAP Version: Invasive Breast 4.4.0.0    Procedure: Skin sparing mastectomy   Laterality: Right  Tumor Site: 9:30, 8 cm FTN  Histologic Type: Invasive ductal carcinoma  Size of Invasive Component: 1.8 x 1.7 x 1.2 cm     Surgical Margins:  Mastectomy  For invasive carcinoma: All margins are greater than 2 cm  For DCIS: All margins are greater than 2 cm              Histologic Grade (Nottingham Histologic Score): II/III  Tubule Formation: 3  Nuclear Grade: 3  Mitotic Count (40x objective): 1  Total Nottingham Score: 7/9  Ductal Carcinoma In-situ (DCIS): Present  Extensive intraductal component (>25%): Absent  DCIS within and/or adjacent to invasive carcinoma: Yes   DCIS separate from invasive carcinoma: No    Lobular Carcinoma In-situ (LCIS): Absent  Lymph-Vascular Invasion: Not identified  Nipple Involvement: Absent   Skin Involvement: Absent   Skeletal Muscle: Not involved   Lymph Node Sampling:  Sentinel lymph node with axillary dissection  Total number of lymph nodes examined: 34  Number of lymph nodes with macrometastases (>2 mm): 1  Number of lymph nodes with micrometastases (>0.2 mm to 2 mm): 0  Number of lymph nodes with isolated tumor cells (<0.2 mm and  <200 cells): 0  Size of largest metastatic deposit (millimeters): 5 mm  Extranodal extension: Not identified  Treatment Effect in the Breast: No known presurgical therapy  Prognostic markers: The results will be issued as an addendum.  Time between tumor removal and placement into formalin < 1 hour: Yes  Fixation Time between 6-72 hours: Yes    Pathologic Staging (pTNM, AJCC 8th edition):  pT1c N1a  Primary Tumor (Invasive Carcinoma) (pT)   pT1c:     Tumor >10 mm but d20 mm in greatest dimension    Regional Lymph Nodes (pN)  pN1a:     Metastases in 1 to 3 axillary lymph nodes, at least 1 metastasis  larger than 2.0 mm    Distant Metastasis (M)    Not applicable    The pathologic stage assigned here should be regarded as provisional, as  it reflects only current pathologic data and does not incorporate full  knowledge of the patient's clinical status and/or prior pathology.    Block for Biomarker Testing: A13       Attestation:  By this signature, I attest that I have personally formu...   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 ?She is status post bilateral skin sparing mastectomy with temporary expander placement, right sentinel lymph node biopsy, axillary lymph node dissection and 1 of 34 lymph nodes positive for a 5 mm metastatic deposit, no LVI, no ENE, no EIC. ?Adjuvant TC chemotherapy x4 cycles is scheduled to be initiated on 09/26/2019. ?Fourth cycle completed 11/28/2019. ?She is a candidate for adjuvant postmastectomy radiation therapy to improve local regional control and breast cancer specific mortality.??She enrolled in MA.39?(HQI#[id number])?Tailor RT, and was randomized to the treatment arm. ?She received a total of 4256 cGy in sixteen treatments which she completed between 02/19/2020 and 03/11/2020.    The patient underwent surgical reconstruction of the bilateral breasts on 07/02/2020.  Clinically well-healed.  No clinical evidence of disease recurrence or progression.  We will order an ultrasound of the upper outer left breast just to confirm fat necrosis versus scar tissue so that it can be followed for stability purposes in the future.  - Patient is participating in a.39.  Measurements of our mobility taken today for purposes of the clinical trial.  - The patient will keep her upcoming appointments with her plastic surgeon and continue the antibiotics per his direction.  - The patient will keep her upcoming appointments for medical oncology.  - The patient will return to our clinic in approximately 6 months for continued follow-up.  She may contact us in the interim if there is questions or concerns requiring earlier assessment.  ?                      Elby Showers, MD    I spent a total of 35 minutes today in direct patient evaluation with more than 50% spent in treatment plan formulation, counseling, symptom management discussion and consultation as outlined above.     Parts of this note were created using voice recognition software.  Please excuse any grammatical or typographical errors.

## 2021-02-11 ENCOUNTER — Encounter: Admit: 2021-02-11 | Discharge: 2021-02-11 | Payer: BC Managed Care – PPO

## 2021-02-11 DIAGNOSIS — C50411 Malignant neoplasm of upper-outer quadrant of right female breast: Secondary | ICD-10-CM

## 2021-02-18 ENCOUNTER — Encounter: Admit: 2021-02-18 | Discharge: 2021-02-18 | Payer: BC Managed Care – PPO

## 2021-03-05 ENCOUNTER — Encounter: Admit: 2021-03-05 | Discharge: 2021-03-05 | Payer: BC Managed Care – PPO

## 2021-03-05 MED ORDER — ESCITALOPRAM OXALATE 10 MG PO TAB
ORAL_TABLET | Freq: Every day | 1 refills | Status: AC
Start: 2021-03-05 — End: ?

## 2021-05-01 ENCOUNTER — Encounter: Admit: 2021-05-01 | Discharge: 2021-05-01 | Payer: BC Managed Care – PPO

## 2021-05-01 DIAGNOSIS — F419 Anxiety disorder, unspecified: Secondary | ICD-10-CM

## 2021-05-01 DIAGNOSIS — E8881 Metabolic syndrome: Secondary | ICD-10-CM

## 2021-05-01 DIAGNOSIS — T7840XA Allergy, unspecified, initial encounter: Secondary | ICD-10-CM

## 2021-05-01 DIAGNOSIS — E282 Polycystic ovarian syndrome: Secondary | ICD-10-CM

## 2021-05-01 DIAGNOSIS — C50919 Malignant neoplasm of unspecified site of unspecified female breast: Secondary | ICD-10-CM

## 2021-05-01 DIAGNOSIS — J302 Other seasonal allergic rhinitis: Secondary | ICD-10-CM

## 2021-05-01 DIAGNOSIS — I10 Essential (primary) hypertension: Secondary | ICD-10-CM

## 2021-05-01 DIAGNOSIS — C50411 Malignant neoplasm of upper-outer quadrant of right female breast: Secondary | ICD-10-CM

## 2021-05-01 DIAGNOSIS — C50911 Malignant neoplasm of unspecified site of right female breast: Secondary | ICD-10-CM

## 2021-05-01 DIAGNOSIS — E039 Hypothyroidism, unspecified: Secondary | ICD-10-CM

## 2021-05-01 DIAGNOSIS — R12 Heartburn: Secondary | ICD-10-CM

## 2021-05-01 DIAGNOSIS — Z789 Other specified health status: Secondary | ICD-10-CM

## 2021-05-01 DIAGNOSIS — Z923 Personal history of irradiation: Secondary | ICD-10-CM

## 2021-05-01 LAB — ESTRADIOL (E2): ESTRADIOL: <15 pg/mL

## 2021-05-21 ENCOUNTER — Encounter: Admit: 2021-05-21 | Discharge: 2021-05-21 | Payer: BC Managed Care – PPO

## 2021-05-21 MED ORDER — ESCITALOPRAM OXALATE 10 MG PO TAB
ORAL_TABLET | 1 refills | Status: AC
Start: 2021-05-21 — End: ?

## 2021-06-15 ENCOUNTER — Encounter: Admit: 2021-06-15 | Discharge: 2021-06-15 | Payer: BC Managed Care – PPO

## 2021-06-15 DIAGNOSIS — Z9189 Other specified personal risk factors, not elsewhere classified: Secondary | ICD-10-CM

## 2021-06-15 DIAGNOSIS — Z08 Encounter for follow-up examination after completed treatment for malignant neoplasm: Secondary | ICD-10-CM

## 2021-06-15 DIAGNOSIS — C50919 Malignant neoplasm of unspecified site of unspecified female breast: Secondary | ICD-10-CM

## 2021-06-15 DIAGNOSIS — I10 Essential (primary) hypertension: Secondary | ICD-10-CM

## 2021-06-15 DIAGNOSIS — F419 Anxiety disorder, unspecified: Secondary | ICD-10-CM

## 2021-06-15 DIAGNOSIS — R12 Heartburn: Secondary | ICD-10-CM

## 2021-06-15 DIAGNOSIS — J302 Other seasonal allergic rhinitis: Secondary | ICD-10-CM

## 2021-06-15 DIAGNOSIS — E8881 Metabolic syndrome: Secondary | ICD-10-CM

## 2021-06-15 DIAGNOSIS — Z789 Other specified health status: Secondary | ICD-10-CM

## 2021-06-15 DIAGNOSIS — E282 Polycystic ovarian syndrome: Secondary | ICD-10-CM

## 2021-06-15 DIAGNOSIS — T7840XA Allergy, unspecified, initial encounter: Secondary | ICD-10-CM

## 2021-06-15 DIAGNOSIS — E039 Hypothyroidism, unspecified: Secondary | ICD-10-CM

## 2021-06-15 DIAGNOSIS — Z923 Personal history of irradiation: Secondary | ICD-10-CM

## 2021-06-15 DIAGNOSIS — C50411 Malignant neoplasm of upper-outer quadrant of right female breast: Secondary | ICD-10-CM

## 2021-06-15 NOTE — Progress Notes
Lymphedema Prevention Bioimpedance Spectroscopy (BIS) Monitoring    Notified patient via MyChart result was normal and to continue with routine follow up as scheduled.  Provided clinic contact information for any questions or concerns.     Reviewed BIS testing results from today.  Results:  RIGHT  Current: 7.3  Baseline: 2.4  Change from Baseline: 4.9    WNL less than 3 standard deviation increase from baseline.

## 2021-06-15 NOTE — Progress Notes
Bioimpedance Spectroscopy performed.  Advised patient that additional information will be sent via Mychart (preferred) or phone if indicated by the lymphedema nurse within 24 hours.

## 2021-07-08 ENCOUNTER — Encounter: Admit: 2021-07-08 | Discharge: 2021-07-08 | Payer: BC Managed Care – PPO

## 2021-07-08 MED ORDER — ESCITALOPRAM OXALATE 10 MG PO TAB
ORAL_TABLET | 1 refills | Status: AC
Start: 2021-07-08 — End: ?

## 2021-07-17 ENCOUNTER — Encounter: Admit: 2021-07-17 | Discharge: 2021-07-17 | Payer: BC Managed Care – PPO

## 2021-07-17 DIAGNOSIS — Z923 Personal history of irradiation: Secondary | ICD-10-CM

## 2021-07-17 DIAGNOSIS — I10 Essential (primary) hypertension: Secondary | ICD-10-CM

## 2021-07-17 DIAGNOSIS — R12 Heartburn: Secondary | ICD-10-CM

## 2021-07-17 DIAGNOSIS — J302 Other seasonal allergic rhinitis: Secondary | ICD-10-CM

## 2021-07-17 DIAGNOSIS — T7840XA Allergy, unspecified, initial encounter: Secondary | ICD-10-CM

## 2021-07-17 DIAGNOSIS — Z789 Other specified health status: Secondary | ICD-10-CM

## 2021-07-17 DIAGNOSIS — E282 Polycystic ovarian syndrome: Secondary | ICD-10-CM

## 2021-07-17 DIAGNOSIS — E039 Hypothyroidism, unspecified: Secondary | ICD-10-CM

## 2021-07-17 DIAGNOSIS — C50919 Malignant neoplasm of unspecified site of unspecified female breast: Secondary | ICD-10-CM

## 2021-07-17 DIAGNOSIS — E8881 Metabolic syndrome: Secondary | ICD-10-CM

## 2021-07-17 DIAGNOSIS — F419 Anxiety disorder, unspecified: Secondary | ICD-10-CM

## 2021-07-17 NOTE — Anesthesia Pre-Procedure Evaluation
Anesthesia Pre-Procedure Evaluation    Name: Jennifer Durham      MRN: [id number]     DOB: 08-25-1975     Age: 46 y.o.     Sex: female   _________________________________________________________________________     Procedure Info:   Procedure Information     Date/Time: 08/10/21 1415    Procedures:       GRAFTING AUTOLOGOUS FAT BY LIPOSUCTION TO TRUNK/ BREASTS/ SCALP/ ARMS/ LEGS - 50 CC OR LESS (Bilateral) - CASE LENGTH 2 HOURS      GRAFTING AUTOLOGOUS FAT BY LIPOSUCTION TO TRUNK/ BREASTS/ SCALP/ ARMS/ LEGS - EACH ADDITIONAL 50 CC (Bilateral)      REVISION RECONSTRUCTED BREAST (Left)      EXCISION BENIGN LESION 1.1 TO 2.0 CM - TORSO abdominal dogear removal      REPAIR INTERMEDIATE WOUND  2.5 CM OR LESS - TORSO abdominal dogear removal    Location: [facility] / [facility]    Surgeons: Marlinda Mike, MD          Physical Assessment  Vital Signs (last filed in past 24 hours):         Patient History   Allergies   Allergen Reactions   ? Tegaderm RASH        Current Medications    Medication Directions   escitalopram oxalate (LEXAPRO) 10 mg tablet TAKE ONE TABLET BY MOUTH EVERY DAY   levothyroxine (SYNTHROID) 50 mcg tablet Take one tablet by mouth daily.   metFORMIN (GLUCOPHAGE) 1,000 mg tablet Take one tablet by mouth twice daily with meals.   montelukast (SINGULAIR) 10 mg tablet Take one tablet by mouth at bedtime daily.   tamoxifen (NOLVADEX) 20 mg tablet Take one tablet by mouth at bedtime daily.   triamterene-hydrochlorothiazide (MAXZIDE) 75-50 mg tablet Take one tablet by mouth daily.         Review of Systems/Medical History      Patient summary reviewed  Nursing notes reviewed  Pertinent labs reviewed  Difficult IV access    PONV Screening: Non-smoker and Female sex  No history of anesthetic complications  No family history of anesthetic complications      Airway - negative        No TMJ      Pulmonary - negative     Not a current smoker        No indications/hx of asthma    no COPD      No recent URI      Cardiovascular         Exercise tolerance: >4 METS (pt able to achieve 8.23 METs per [facility])      Beta Blocker therapy: No      Beta blockers within 24 hours: n/a        Hypertension, well controlled      No valvular problems/murmurs      No palpitations      No dysrhythmias      No angina        No hyperlipidemia      No orthopnea      No dyspnea on exertion      No syncope      GI/Hepatic/Renal           GERD ( occasional), well controlled      No liver disease:        No renal disease:        Neuro/Psych       No seizures  No CVA      No headaches      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)        Psychiatric history ( controlled)          Anxiety      Musculoskeletal - negative        No neck pain      No back pain      Endocrine/Other       No diabetes      No hypothyroidism      No hyperthyroidism      No anemia      No blood dyscrasia      No history of blood transfusion      Malignancy (right breast cancer- s/p XRT, chemotx, mastectomy):      Obesity (BMI 42)        PCOS  Insulin resistance - Metformin    Constitution - negative   PHYSICAL EXAM     Diagnostic Tests  Hematology:   Lab Results   Component Value Date    HGB 11.3 10/28/2020    HCT 35.3 10/28/2020    PLTCT 353 10/28/2020    WBC 6.9 10/28/2020    NEUT 69 10/28/2020    ANC 4.80 10/28/2020    ALC 1.10 10/28/2020    MONA 6 10/28/2020    AMC 0.40 10/28/2020    EOSA 8 10/28/2020    ABC 0.10 10/28/2020    MCV 86.5 10/28/2020    MCH 27.6 10/28/2020    MCHC 31.9 10/28/2020    MPV 7.0 10/28/2020    RDW 18.2 10/28/2020         General Chemistry:   Lab Results   Component Value Date    NA 146 10/28/2020    K 3.7 10/28/2020    CL 105 10/28/2020    CO2 30 10/28/2020    GAP 11 10/28/2020    BUN 14 10/28/2020    CR 1.22 10/28/2020    GLU 121 10/28/2020    CA 9.7 10/28/2020    ALBUMIN 4.0 10/28/2020    TOTBILI 0.4 10/28/2020      Coagulation: No results found for: PT, PTT, INR      PLAN   Labs: CBC, BMP, T&S, DOS: T&C  ROI: none  Consults: none

## 2021-07-30 ENCOUNTER — Encounter: Admit: 2021-07-30 | Discharge: 2021-07-30 | Payer: BC Managed Care – PPO

## 2021-07-30 ENCOUNTER — Ambulatory Visit: Admit: 2021-07-30 | Discharge: 2021-07-30 | Payer: BC Managed Care – PPO

## 2021-07-30 ENCOUNTER — Ambulatory Visit: Admit: 2021-07-30 | Discharge: 2021-07-31 | Payer: BC Managed Care – PPO

## 2021-07-30 DIAGNOSIS — T7840XA Allergy, unspecified, initial encounter: Secondary | ICD-10-CM

## 2021-07-30 DIAGNOSIS — E8881 Metabolic syndrome: Secondary | ICD-10-CM

## 2021-07-30 DIAGNOSIS — E039 Hypothyroidism, unspecified: Secondary | ICD-10-CM

## 2021-07-30 DIAGNOSIS — F419 Anxiety disorder, unspecified: Secondary | ICD-10-CM

## 2021-07-30 DIAGNOSIS — Z923 Personal history of irradiation: Secondary | ICD-10-CM

## 2021-07-30 DIAGNOSIS — E282 Polycystic ovarian syndrome: Secondary | ICD-10-CM

## 2021-07-30 DIAGNOSIS — F32A Depression: Secondary | ICD-10-CM

## 2021-07-30 DIAGNOSIS — C50919 Malignant neoplasm of unspecified site of unspecified female breast: Secondary | ICD-10-CM

## 2021-07-30 DIAGNOSIS — I10 Essential (primary) hypertension: Secondary | ICD-10-CM

## 2021-07-30 DIAGNOSIS — R12 Heartburn: Secondary | ICD-10-CM

## 2021-07-30 DIAGNOSIS — J302 Other seasonal allergic rhinitis: Secondary | ICD-10-CM

## 2021-07-30 DIAGNOSIS — Z789 Other specified health status: Secondary | ICD-10-CM

## 2021-07-30 DIAGNOSIS — C50411 Malignant neoplasm of upper-outer quadrant of right female breast: Secondary | ICD-10-CM

## 2021-07-31 ENCOUNTER — Encounter: Admit: 2021-07-31 | Discharge: 2021-07-31 | Payer: BC Managed Care – PPO

## 2021-07-31 DIAGNOSIS — J302 Other seasonal allergic rhinitis: Secondary | ICD-10-CM

## 2021-07-31 DIAGNOSIS — R12 Heartburn: Secondary | ICD-10-CM

## 2021-07-31 DIAGNOSIS — E039 Hypothyroidism, unspecified: Secondary | ICD-10-CM

## 2021-07-31 DIAGNOSIS — E8881 Metabolic syndrome: Secondary | ICD-10-CM

## 2021-07-31 DIAGNOSIS — C50919 Malignant neoplasm of unspecified site of unspecified female breast: Secondary | ICD-10-CM

## 2021-07-31 DIAGNOSIS — Z923 Personal history of irradiation: Secondary | ICD-10-CM

## 2021-07-31 DIAGNOSIS — T7840XA Allergy, unspecified, initial encounter: Secondary | ICD-10-CM

## 2021-07-31 DIAGNOSIS — F419 Anxiety disorder, unspecified: Secondary | ICD-10-CM

## 2021-07-31 DIAGNOSIS — I10 Essential (primary) hypertension: Secondary | ICD-10-CM

## 2021-07-31 DIAGNOSIS — E282 Polycystic ovarian syndrome: Secondary | ICD-10-CM

## 2021-07-31 DIAGNOSIS — F32A Depression: Secondary | ICD-10-CM

## 2021-07-31 DIAGNOSIS — Z789 Other specified health status: Secondary | ICD-10-CM

## 2021-08-10 ENCOUNTER — Encounter: Admit: 2021-08-10 | Discharge: 2021-08-10 | Payer: BC Managed Care – PPO

## 2021-08-10 ENCOUNTER — Ambulatory Visit: Admit: 2021-08-10 | Discharge: 2021-08-10 | Payer: BC Managed Care – PPO

## 2021-08-10 DIAGNOSIS — Z923 Personal history of irradiation: Secondary | ICD-10-CM

## 2021-08-10 DIAGNOSIS — R12 Heartburn: Secondary | ICD-10-CM

## 2021-08-10 DIAGNOSIS — E8881 Metabolic syndrome: Secondary | ICD-10-CM

## 2021-08-10 DIAGNOSIS — F32A Depression: Secondary | ICD-10-CM

## 2021-08-10 DIAGNOSIS — E039 Hypothyroidism, unspecified: Secondary | ICD-10-CM

## 2021-08-10 DIAGNOSIS — I10 Essential (primary) hypertension: Secondary | ICD-10-CM

## 2021-08-10 DIAGNOSIS — Z789 Other specified health status: Secondary | ICD-10-CM

## 2021-08-10 DIAGNOSIS — J302 Other seasonal allergic rhinitis: Secondary | ICD-10-CM

## 2021-08-10 DIAGNOSIS — T7840XA Allergy, unspecified, initial encounter: Secondary | ICD-10-CM

## 2021-08-10 DIAGNOSIS — F419 Anxiety disorder, unspecified: Secondary | ICD-10-CM

## 2021-08-10 DIAGNOSIS — C50919 Malignant neoplasm of unspecified site of unspecified female breast: Secondary | ICD-10-CM

## 2021-08-10 DIAGNOSIS — E282 Polycystic ovarian syndrome: Secondary | ICD-10-CM

## 2021-08-10 MED ORDER — ARTIFICIAL TEARS (PF) SINGLE DOSE DROPS GROUP
OPHTHALMIC | 0 refills | Status: DC
Start: 2021-08-10 — End: 2021-08-10
  Administered 2021-08-10: 19:00:00 2 [drp] via OPHTHALMIC

## 2021-08-10 MED ORDER — ONDANSETRON HCL (PF) 4 MG/2 ML IJ SOLN
INTRAVENOUS | 0 refills | Status: DC
Start: 2021-08-10 — End: 2021-08-10
  Administered 2021-08-10: 20:00:00 4 mg via INTRAVENOUS

## 2021-08-10 MED ORDER — FENTANYL CITRATE (PF) 50 MCG/ML IJ SOLN
INTRAVENOUS | 0 refills | Status: DC
Start: 2021-08-10 — End: 2021-08-10
  Administered 2021-08-10 (×3): 50 ug via INTRAVENOUS

## 2021-08-10 MED ORDER — DEXAMETHASONE SODIUM PHOSPHATE 4 MG/ML IJ SOLN
INTRAVENOUS | 0 refills | Status: DC
Start: 2021-08-10 — End: 2021-08-10
  Administered 2021-08-10: 19:00:00 10 mg via INTRAVENOUS

## 2021-08-10 MED ORDER — SUCCINYLCHOLINE CHLORIDE 20 MG/ML IJ SOLN
INTRAVENOUS | 0 refills | Status: DC
Start: 2021-08-10 — End: 2021-08-10
  Administered 2021-08-10: 18:00:00 200 mg via INTRAVENOUS

## 2021-08-10 MED ORDER — LIDOCAINE (PF) 200 MG/10 ML (2 %) IJ SYRG
INTRAVENOUS | 0 refills | Status: DC
Start: 2021-08-10 — End: 2021-08-10
  Administered 2021-08-10: 18:00:00 100 mg via INTRAVENOUS

## 2021-08-10 MED ORDER — PROPOFOL INJ 10 MG/ML IV VIAL
INTRAVENOUS | 0 refills | Status: DC
Start: 2021-08-10 — End: 2021-08-10
  Administered 2021-08-10: 18:00:00 200 mg via INTRAVENOUS

## 2021-08-10 MED ORDER — PROPOFOL 10 MG/ML IV EMUL 100 ML (INFUSION)(AM)(OR)
INTRAVENOUS | 0 refills | Status: DC
Start: 2021-08-10 — End: 2021-08-10
  Administered 2021-08-10: 18:00:00 100 ug/kg/min via INTRAVENOUS

## 2021-08-10 MED ORDER — ROCURONIUM 10 MG/ML IV SOLN
INTRAVENOUS | 0 refills | Status: DC
Start: 2021-08-10 — End: 2021-08-10
  Administered 2021-08-10: 19:00:00 50 mg via INTRAVENOUS

## 2021-08-10 MED ORDER — MIDAZOLAM 1 MG/ML IJ SOLN
INTRAVENOUS | 0 refills | Status: DC
Start: 2021-08-10 — End: 2021-08-10
  Administered 2021-08-10: 18:00:00 2 mg via INTRAVENOUS

## 2021-08-10 MED ORDER — SUGAMMADEX 100 MG/ML IV SOLN
INTRAVENOUS | 0 refills | Status: DC
Start: 2021-08-10 — End: 2021-08-10
  Administered 2021-08-10: 20:00:00 280 mg via INTRAVENOUS

## 2021-08-10 MED ORDER — CEFAZOLIN 1 GRAM IJ SOLR
INTRAVENOUS | 0 refills | Status: DC
Start: 2021-08-10 — End: 2021-08-10
  Administered 2021-08-10: 18:00:00 3 g via INTRAVENOUS

## 2021-08-10 MED ADMIN — OXYCODONE 5 MG PO TAB [10814]: 10 mg | ORAL | @ 20:00:00 | Stop: 2021-08-10 | NDC 00406055223

## 2021-08-10 MED ADMIN — LACTATED RINGERS IV SOLP [4318]: 2000 mL | @ 19:00:00 | Stop: 2021-08-10 | NDC 00338011704

## 2021-08-10 MED ADMIN — LACTATED RINGERS IV SOLP [4318]: 1000.000 mL | INTRAVENOUS | @ 19:00:00 | Stop: 2021-08-10 | NDC 00338011704

## 2021-08-10 MED ADMIN — ACETAMINOPHEN 500 MG PO TAB [102]: 1000 mg | ORAL | @ 16:00:00 | Stop: 2021-08-10 | NDC 00904673061

## 2021-08-10 MED ADMIN — LACTATED RINGERS IV SOLP [4318]: 1000 mL | INTRAVENOUS | @ 15:00:00 | Stop: 2021-08-10 | NDC 00338011704

## 2021-08-10 MED ADMIN — CELECOXIB 200 MG PO CAP [76958]: 200 mg | ORAL | @ 16:00:00 | Stop: 2021-08-10 | NDC 00904650361

## 2021-08-10 MED ADMIN — LIDOCAINE HCL 10 MG/ML (1 %) IJ SOLN [4452]: 100 mL | INTRAMUSCULAR | @ 19:00:00 | Stop: 2021-08-10 | NDC 00409427602

## 2021-08-10 MED ADMIN — EPINEPHRINE 1 MG/ML (1 ML) IJ SOLN [307529]: 2000 mL | @ 19:00:00 | Stop: 2021-08-10 | NDC 00409724110

## 2021-08-10 MED FILL — DOXYCYCLINE HYCLATE 100 MG PO TAB: 100 mg | ORAL | 10 days supply | Qty: 20 | Fill #1 | Status: CP

## 2021-08-10 MED FILL — TRAMADOL 50 MG PO TAB: 50 mg | ORAL | 4 days supply | Qty: 20 | Fill #1 | Status: CP

## 2021-08-12 ENCOUNTER — Encounter: Admit: 2021-08-12 | Discharge: 2021-08-12 | Payer: BC Managed Care – PPO

## 2021-08-12 DIAGNOSIS — E282 Polycystic ovarian syndrome: Secondary | ICD-10-CM

## 2021-08-12 DIAGNOSIS — I10 Essential (primary) hypertension: Secondary | ICD-10-CM

## 2021-08-12 DIAGNOSIS — T7840XA Allergy, unspecified, initial encounter: Secondary | ICD-10-CM

## 2021-08-12 DIAGNOSIS — R12 Heartburn: Secondary | ICD-10-CM

## 2021-08-12 DIAGNOSIS — J302 Other seasonal allergic rhinitis: Secondary | ICD-10-CM

## 2021-08-12 DIAGNOSIS — Z789 Other specified health status: Secondary | ICD-10-CM

## 2021-08-12 DIAGNOSIS — E039 Hypothyroidism, unspecified: Secondary | ICD-10-CM

## 2021-08-12 DIAGNOSIS — E8881 Metabolic syndrome: Secondary | ICD-10-CM

## 2021-08-12 DIAGNOSIS — F419 Anxiety disorder, unspecified: Secondary | ICD-10-CM

## 2021-08-12 DIAGNOSIS — F32A Depression: Secondary | ICD-10-CM

## 2021-08-12 DIAGNOSIS — C50919 Malignant neoplasm of unspecified site of unspecified female breast: Secondary | ICD-10-CM

## 2021-08-12 DIAGNOSIS — Z923 Personal history of irradiation: Secondary | ICD-10-CM

## 2021-08-20 ENCOUNTER — Encounter: Admit: 2021-08-20 | Discharge: 2021-08-20 | Payer: BC Managed Care – PPO

## 2021-08-20 ENCOUNTER — Ambulatory Visit: Admit: 2021-08-20 | Discharge: 2021-08-21 | Payer: BC Managed Care – PPO

## 2021-08-20 DIAGNOSIS — C50919 Malignant neoplasm of unspecified site of unspecified female breast: Secondary | ICD-10-CM

## 2021-08-20 DIAGNOSIS — F419 Anxiety disorder, unspecified: Secondary | ICD-10-CM

## 2021-08-20 DIAGNOSIS — Z789 Other specified health status: Secondary | ICD-10-CM

## 2021-08-20 DIAGNOSIS — E8881 Metabolic syndrome: Secondary | ICD-10-CM

## 2021-08-20 DIAGNOSIS — E282 Polycystic ovarian syndrome: Secondary | ICD-10-CM

## 2021-08-20 DIAGNOSIS — Z923 Personal history of irradiation: Secondary | ICD-10-CM

## 2021-08-20 DIAGNOSIS — Z9889 Other specified postprocedural states: Secondary | ICD-10-CM

## 2021-08-20 DIAGNOSIS — R12 Heartburn: Secondary | ICD-10-CM

## 2021-08-20 DIAGNOSIS — I10 Essential (primary) hypertension: Secondary | ICD-10-CM

## 2021-08-20 DIAGNOSIS — T7840XA Allergy, unspecified, initial encounter: Secondary | ICD-10-CM

## 2021-08-20 DIAGNOSIS — C50411 Malignant neoplasm of upper-outer quadrant of right female breast: Secondary | ICD-10-CM

## 2021-08-20 DIAGNOSIS — F32A Depression: Secondary | ICD-10-CM

## 2021-08-20 DIAGNOSIS — N650 Deformity of reconstructed breast: Secondary | ICD-10-CM

## 2021-08-20 DIAGNOSIS — E039 Hypothyroidism, unspecified: Secondary | ICD-10-CM

## 2021-08-20 DIAGNOSIS — J302 Other seasonal allergic rhinitis: Secondary | ICD-10-CM

## 2021-08-20 NOTE — Progress Notes
Subjective:       History of Present Illness  Jennifer Durham is a 46 y.o. female.   Cancer Staging   Malignant neoplasm of upper-outer quadrant of right breast in female, estrogen receptor positive (HCC)  Staging form: Breast, AJCC 8th Edition  - Clinical stage from 07/04/2019: Stage IA (cT1c, cN0(f), cM0, G2, ER+, PR+, HER2-) - Signed by Guy Begin, PA-C on 07/12/2019  - Pathologic stage from 08/21/2019: Stage IA (pT1c, pN1a, cM0, G2, ER+, PR+, HER2-) - Signed by Guy Begin, PA-C on 08/30/2019    Patient returns to clinic postoperatively. She is overall doing well and notes her pain has been tolerable. She has been wearing abdominal compression and postop brasserie regularly. She is interested in NAC tattoos in the future.   Allergies   Allergen Reactions   ? Tegaderm RASH        Review of Systems   Constitutional: Positive for fatigue.   HENT: Negative.    Eyes: Negative.    Respiratory: Negative.    Cardiovascular: Negative.    Gastrointestinal: Negative.    Endocrine: Negative.    Genitourinary: Negative.    Musculoskeletal: Negative.    Skin: Negative.    Allergic/Immunologic: Negative.    Neurological: Negative.    Hematological: Negative.    Psychiatric/Behavioral: Negative.      Medical History:   Diagnosis Date   ? Allergy    ? Anxiety disorder    ? Breast cancer (HCC) 07/04/2019    right   ? Depression 11/25/2021   ? Heartburn    ? Hx of radiation therapy 2022   ? Hypertension    ? Hypothyroidism 09/2020   ? Insulin resistance    ? Limb alert care status     No venipuncture/BP's to RUE   ? PCOS (polycystic ovarian syndrome)    ? Seasonal allergies     Frequent sinusitis     Surgical History:   Procedure Laterality Date   ? HX TONSILLECTOMY  2001    turbinates/adenoids 2015   ? HX TONSIL AND ADENOIDECTOMY  2011   ? LEFT PERONEAL TENDON REPAIR Left 12/15/2016    Performed by Vopat, Lowry Ram, MD at [facility]   ? Left Skin Sparing Mastectomy Left 08/21/2019    Performed by Renford Dills, MD at [facility]   ? IDENTIFICATION SENTINEL LYMPH NODE Right 08/21/2019    Performed by Renford Dills, MD at [facility]   ? INJECTION RADIOACTIVE TRACER FOR SENTINEL NODE IDENTIFICATION Right 08/21/2019    Performed by Renford Dills, MD at [facility]   ? RADIOLOGICAL EXAM SURGICAL SPECIMEN Right 08/21/2019    Performed by Renford Dills, MD at [facility]   ? MASTECTOMY - MODIFIED RADICAL WITH AXILLARY LYMPH NODE DISSECTION Right 08/21/2019    Performed by Renford Dills, MD at [facility]   ? RECONSTRUCTION BREAST WITH TISSUE EXPANDER AND SUBSEQUENT EXPANSION - IMMEDIATE/ DELAYED Bilateral 08/21/2019    Performed by Marlinda Mike, MD at [facility]   ? IMPLANTATION BIOLOGIC IMPLANT FOR SOFT TISSUE REINFORCEMENT Bilateral 08/21/2019    Performed by Marlinda Mike, MD at [facility]   ? RECONSTRUCTION BREAST WITH DEEP INFERIOR EPIGASTRIC PERFORATOR FLAP/ SUPERFICIAL INFERIOR EPIGASTRIC ARTERY FLAP Bilateral 07/02/2020    Performed by Marlinda Mike, MD at [facility] OR   ? RECONSTRUCTION BREAST WITH FREE FLAP - request 22 modifier, complicated procedure due to obesity and intramuscular course Bilateral 07/02/2020  Performed by Marlinda Mike, MD at [facility] OR   ? INTRAVENOUS INJECTION AGENT TO TEST VASCULAR FLOW IN FLAP/ GRAFT Bilateral 07/02/2020    Performed by Marlinda Mike, MD at [facility] OR   ? APPLICATION NEGATIVE PRESSURE WOUND THERAPY Bilateral 07/02/2020    Performed by Marlinda Mike, MD at [facility] OR   ? IMPLANTATION MESH CLOSURE OF DEBRIDEMENT FOR NECROTIZING SOFT TISSUE INFECTION Bilateral 07/02/2020    Performed by Marlinda Mike, MD at [facility] OR   ? REMOVAL TISSUE EXPANDER Bilateral 07/02/2020    Performed by Marlinda Mike, MD at [facility] OR   ? INCISION AND DRAINAGE HEMATOMA/ SEROMA - TORSO N/A 08/05/2020    Performed by Marlinda Mike, MD at [facility] OR   ? REPAIR INTERMEDIATE WOUND 12.6 CM TO 20.0 CM - TRUNK - total length 24cm  08/05/2020    Performed by Marlinda Mike, MD at [facility] OR   ? GRAFTING AUTOLOGOUS FAT BY LIPOSUCTION TO TRUNK/ BREASTS/ SCALP/ ARMS/ LEGS - 50 CC OR LESS 60cc to the R, 90cc to the L Bilateral 08/10/2021    Performed by Marlinda Mike, MD at [facility]   ? GRAFTING AUTOLOGOUS FAT BY LIPOSUCTION TO TRUNK/ BREASTS/ SCALP/ ARMS/ LEGS - EACH ADDITIONAL 50 CC Bilateral 08/10/2021    Performed by Marlinda Mike, MD at [facility]   ? REVISION RECONSTRUCTED BREAST Left 08/10/2021    Performed by Marlinda Mike, MD at [facility]   ? REPAIR INTERMEDIATE WOUND  2.5 CM OR LESS - TORSO abdominal dogear removal R side - 9cm, L side - 10cm N/A 08/10/2021    Performed by Marlinda Mike, MD at [facility] OR     Family History   Problem Relation Age of Onset   ? Heart Attack Mother    ? Cancer Mother    ? Diabetes Mother    ? Cancer-Breast Mother 58   ? Heart Disease Mother    ? Hypertension Mother    ? Heart Disease Father    ? Hypothyroid Father    ? Hypertension Father    ? Thyroid Disease Father    ? Hypertension Brother    ? Thyroid Disease Brother    ? Cancer-Breast Maternal Aunt 50   ? Cancer-Ovarian Maternal Aunt 50   ? Diabetes Maternal Aunt    ? Arthritis-rheumatoid Maternal Aunt    ? Diabetes Maternal Grandmother    ? Heart Disease Maternal Grandfather    ? Thyroid Disease Brother      Social History     Socioeconomic History   ? Marital status: Married   Tobacco Use   ? Smoking status: Never     Passive exposure: Never   ? Smokeless tobacco: Never   Vaping Use   ? Vaping Use: Never used   Substance and Sexual Activity   ? Alcohol use: Yes     Alcohol/week: 2.0 standard drinks of alcohol     Types: 2 Drinks containing 0.5 oz of alcohol per week     Comment: M   ? Drug use: No   ? Sexual activity: Yes     Partners: Male     Birth control/protection: I.U.D.     Vaping/E-liquid Use   ? Vaping Use Never User                   Objective:         ? escitalopram oxalate (LEXAPRO) 10 mg tablet TAKE ONE  TABLET BY MOUTH EVERY DAY   ? levothyroxine (SYNTHROID) 50 mcg tablet Take one tablet by mouth daily.   ? metFORMIN (GLUCOPHAGE) 1,000 mg tablet Take one tablet by mouth twice daily with meals.   ? montelukast (SINGULAIR) 10 mg tablet Take one tablet by mouth at bedtime daily.   ? tamoxifen (NOLVADEX) 20 mg tablet Take one tablet by mouth at bedtime daily.   ? traMADoL (ULTRAM) 50 mg tablet Take one tablet to two tablets by mouth every 8 hours as needed for Pain.   ? triamterene-hydrochlorothiazide (MAXZIDE) 75-50 mg tablet Take one tablet by mouth daily.     Vitals:    08/20/21 1021   BP: 119/86   Pulse: 109   Temp: 37.3 ?C (99.1 ?F)   TempSrc: Skin   PainSc: Two   Weight: (!) 140.6 kg (309 lb 14.4 oz)   Height: 182.9 cm (6')     Body mass index is 42.03 kg/m?Marland Kitchen     Physical Exam  Vitals reviewed.   Constitutional:       Appearance: Normal appearance. She is well-groomed.   HENT:      Head: Normocephalic.   Pulmonary:      Effort: Pulmonary effort is normal.   Chest:      Comments: Improved symmetry of reconstructed breasts. Left lateral breast incision healing appropriately. Left breast is larger than right breast.   Abdominal:      Palpations: Abdomen is soft.      Tenderness: There is abdominal tenderness.      Comments: Abdominal dogear excision sites healing well.    Skin:     General: Skin is warm and dry.      Findings: Bruising present.   Neurological:      Mental Status: She is alert.   Psychiatric:         Mood and Affect: Mood normal.         Behavior: Behavior normal. Behavior is cooperative.         Thought Content: Thought content normal.         Judgment: Judgment normal.          Assessment and Plan:  Patient s/p bilateral breast fat grafting, revision of left breast and abdominal dogear excisions.   Continue compression and restrictions until week 4 postop.    RTC ~ 6 weeks.

## 2021-09-17 ENCOUNTER — Encounter: Admit: 2021-09-17 | Discharge: 2021-09-17 | Payer: BC Managed Care – PPO

## 2021-09-17 MED ORDER — ESCITALOPRAM OXALATE 10 MG PO TAB
ORAL_TABLET | 1 refills | Status: AC
Start: 2021-09-17 — End: ?

## 2021-09-18 ENCOUNTER — Encounter: Admit: 2021-09-18 | Discharge: 2021-09-18 | Payer: BC Managed Care – PPO

## 2021-09-18 DIAGNOSIS — C50411 Malignant neoplasm of upper-outer quadrant of right female breast: Secondary | ICD-10-CM

## 2021-10-01 ENCOUNTER — Ambulatory Visit: Admit: 2021-10-01 | Discharge: 2021-10-02 | Payer: BC Managed Care – PPO

## 2021-10-01 ENCOUNTER — Encounter: Admit: 2021-10-01 | Discharge: 2021-10-01 | Payer: BC Managed Care – PPO

## 2021-10-01 DIAGNOSIS — R12 Heartburn: Secondary | ICD-10-CM

## 2021-10-01 DIAGNOSIS — C50919 Malignant neoplasm of unspecified site of unspecified female breast: Secondary | ICD-10-CM

## 2021-10-01 DIAGNOSIS — Z923 Personal history of irradiation: Secondary | ICD-10-CM

## 2021-10-01 DIAGNOSIS — C50411 Malignant neoplasm of upper-outer quadrant of right female breast: Secondary | ICD-10-CM

## 2021-10-01 DIAGNOSIS — Z789 Other specified health status: Secondary | ICD-10-CM

## 2021-10-01 DIAGNOSIS — E8881 Metabolic syndrome: Secondary | ICD-10-CM

## 2021-10-01 DIAGNOSIS — T7840XA Allergy, unspecified, initial encounter: Secondary | ICD-10-CM

## 2021-10-01 DIAGNOSIS — J302 Other seasonal allergic rhinitis: Secondary | ICD-10-CM

## 2021-10-01 DIAGNOSIS — Z9889 Other specified postprocedural states: Secondary | ICD-10-CM

## 2021-10-01 DIAGNOSIS — I10 Essential (primary) hypertension: Secondary | ICD-10-CM

## 2021-10-01 DIAGNOSIS — E282 Polycystic ovarian syndrome: Secondary | ICD-10-CM

## 2021-10-01 DIAGNOSIS — F419 Anxiety disorder, unspecified: Secondary | ICD-10-CM

## 2021-10-01 DIAGNOSIS — E039 Hypothyroidism, unspecified: Secondary | ICD-10-CM

## 2021-10-01 DIAGNOSIS — F32A Depression: Secondary | ICD-10-CM

## 2021-10-01 NOTE — Progress Notes
Subjective:       History of Present Illness  Jennifer Durham is a 46 y.o. female.   Cancer Staging   Malignant neoplasm of upper-outer quadrant of right breast in female, estrogen receptor positive (HCC)  Staging form: Breast, AJCC 8th Edition  - Clinical stage from 07/04/2019: Stage IA (cT1c, cN0(f), cM0, G2, ER+, PR+, HER2-) - Signed by Guy Begin, PA-C on 07/12/2019  - Pathologic stage from 08/21/2019: Stage IA (pT1c, pN1a, cM0, G2, ER+, PR+, HER2-) - Signed by Guy Begin, PA-C on 08/30/2019    Patient returns to clinic for follow-up. She is overall doing well and is pleased with her result at this time. She feels that her breasts are symmetrical in clothing. She is interested in NAC tattoos in the future.   Allergies   Allergen Reactions   ? Tegaderm RASH        Review of Systems   Constitutional: Negative.    HENT: Negative.    Eyes: Negative.    Respiratory: Negative.    Cardiovascular: Negative.    Gastrointestinal: Negative.    Endocrine: Negative.    Genitourinary: Negative.    Musculoskeletal: Negative.    Skin: Negative.    Allergic/Immunologic: Negative.    Neurological: Negative.    Hematological: Negative.    Psychiatric/Behavioral: Negative.      Medical History:   Diagnosis Date   ? Allergy    ? Anxiety disorder    ? Breast cancer (HCC) 07/04/2019    right   ? Depression 11/25/2021   ? Heartburn    ? Hx of radiation therapy 2022   ? Hypertension    ? Hypothyroidism 09/2020   ? Insulin resistance    ? Limb alert care status     No venipuncture/BP's to RUE   ? PCOS (polycystic ovarian syndrome)    ? Seasonal allergies     Frequent sinusitis     Surgical History:   Procedure Laterality Date   ? HX TONSILLECTOMY  2001    turbinates/adenoids 2015   ? HX TONSIL AND ADENOIDECTOMY  2011   ? LEFT PERONEAL TENDON REPAIR Left 12/15/2016    Performed by Vopat, Lowry Ram, MD at [facility]   ? Left Skin Sparing Mastectomy Left 08/21/2019    Performed by Renford Dills, MD at [facility]   ? IDENTIFICATION SENTINEL LYMPH NODE Right 08/21/2019    Performed by Renford Dills, MD at [facility]   ? INJECTION RADIOACTIVE TRACER FOR SENTINEL NODE IDENTIFICATION Right 08/21/2019    Performed by Renford Dills, MD at [facility]   ? RADIOLOGICAL EXAM SURGICAL SPECIMEN Right 08/21/2019    Performed by Renford Dills, MD at [facility]   ? MASTECTOMY - MODIFIED RADICAL WITH AXILLARY LYMPH NODE DISSECTION Right 08/21/2019    Performed by Renford Dills, MD at [facility]   ? RECONSTRUCTION BREAST WITH TISSUE EXPANDER AND SUBSEQUENT EXPANSION - IMMEDIATE/ DELAYED Bilateral 08/21/2019    Performed by Marlinda Mike, MD at [facility]   ? IMPLANTATION BIOLOGIC IMPLANT FOR SOFT TISSUE REINFORCEMENT Bilateral 08/21/2019    Performed by Marlinda Mike, MD at [facility]   ? RECONSTRUCTION BREAST WITH DEEP INFERIOR EPIGASTRIC PERFORATOR FLAP/ SUPERFICIAL INFERIOR EPIGASTRIC ARTERY FLAP Bilateral 07/02/2020    Performed by Marlinda Mike, MD at [facility] OR   ? RECONSTRUCTION BREAST WITH FREE FLAP - request 22 modifier, complicated procedure due to obesity and intramuscular course Bilateral 07/02/2020  Performed by Marlinda Mike, MD at [facility] OR   ? INTRAVENOUS INJECTION AGENT TO TEST VASCULAR FLOW IN FLAP/ GRAFT Bilateral 07/02/2020    Performed by Marlinda Mike, MD at [facility] OR   ? APPLICATION NEGATIVE PRESSURE WOUND THERAPY Bilateral 07/02/2020    Performed by Marlinda Mike, MD at [facility] OR   ? IMPLANTATION MESH CLOSURE OF DEBRIDEMENT FOR NECROTIZING SOFT TISSUE INFECTION Bilateral 07/02/2020    Performed by Marlinda Mike, MD at [facility] OR   ? REMOVAL TISSUE EXPANDER Bilateral 07/02/2020    Performed by Marlinda Mike, MD at [facility] OR   ? INCISION AND DRAINAGE HEMATOMA/ SEROMA - TORSO N/A 08/05/2020    Performed by Marlinda Mike, MD at [facility]   ? REPAIR INTERMEDIATE WOUND 12.6 CM TO 20.0 CM - TRUNK - total length 24cm  08/05/2020    Performed by Marlinda Mike, MD at [facility]   ? GRAFTING AUTOLOGOUS FAT BY LIPOSUCTION TO TRUNK/ BREASTS/ SCALP/ ARMS/ LEGS - 50 CC OR LESS 60cc to the R, 90cc to the L Bilateral 08/10/2021    Performed by Marlinda Mike, MD at [facility]   ? GRAFTING AUTOLOGOUS FAT BY LIPOSUCTION TO TRUNK/ BREASTS/ SCALP/ ARMS/ LEGS - EACH ADDITIONAL 50 CC Bilateral 08/10/2021    Performed by Marlinda Mike, MD at [facility]   ? REVISION RECONSTRUCTED BREAST Left 08/10/2021    Performed by Marlinda Mike, MD at [facility]   ? REPAIR INTERMEDIATE WOUND  2.5 CM OR LESS - TORSO abdominal dogear removal R side - 9cm, L side - 10cm N/A 08/10/2021    Performed by Marlinda Mike, MD at [facility]     Family History   Problem Relation Age of Onset   ? Heart Attack Mother    ? Cancer Mother    ? Diabetes Mother    ? Cancer-Breast Mother 58   ? Heart Disease Mother    ? Hypertension Mother    ? Heart Disease Father    ? Hypothyroid Father    ? Hypertension Father    ? Thyroid Disease Father    ? Hypertension Brother    ? Thyroid Disease Brother    ? Cancer-Breast Maternal Aunt 50   ? Cancer-Ovarian Maternal Aunt 50   ? Diabetes Maternal Aunt    ? Arthritis-rheumatoid Maternal Aunt    ? Diabetes Maternal Grandmother    ? Heart Disease Maternal Grandfather    ? Thyroid Disease Brother      Social History     Socioeconomic History   ? Marital status: Married   Tobacco Use   ? Smoking status: Never     Passive exposure: Never   ? Smokeless tobacco: Never   Vaping Use   ? Vaping Use: Never used   Substance and Sexual Activity   ? Alcohol use: Yes     Alcohol/week: 2.0 standard drinks of alcohol     Types: 2 Drinks containing 0.5 oz of alcohol per week     Comment: M   ? Drug use: No   ? Sexual activity: Yes     Partners: Male     Birth control/protection: I.U.D.     Vaping/E-liquid Use   ? Vaping Use Never User                   Objective:         ? escitalopram oxalate (LEXAPRO) 10 mg tablet TAKE ONE  TABLET BY MOUTH EVERY DAY   ? levothyroxine (SYNTHROID) 50 mcg tablet Take one tablet by mouth daily.   ? metFORMIN (GLUCOPHAGE) 1,000 mg tablet Take one tablet by mouth twice daily with meals.   ? montelukast (SINGULAIR) 10 mg tablet Take one tablet by mouth at bedtime daily.   ? tamoxifen (NOLVADEX) 20 mg tablet Take one tablet by mouth at bedtime daily.   ? traMADoL (ULTRAM) 50 mg tablet Take one tablet to two tablets by mouth every 8 hours as needed for Pain.   ? triamterene-hydrochlorothiazide (MAXZIDE) 75-50 mg tablet Take one tablet by mouth daily.     Vitals:    10/01/21 0807   BP: 129/86   Pulse: 98   Temp: 36.6 ?C (97.9 ?F)   TempSrc: Skin   PainSc: Zero   Weight: (!) 143.5 kg (316 lb 4.8 oz)   Height: 182.9 cm (6')     Body mass index is 42.9 kg/m?Marland Kitchen     Physical Exam  Vitals reviewed.   Constitutional:       Appearance: Normal appearance. She is well-groomed.   HENT:      Head: Normocephalic.   Pulmonary:      Effort: Pulmonary effort is normal.   Chest:      Comments: Bilateral breast DIEP well healed.  Abdominal:      Palpations: Abdomen is soft.   Skin:     General: Skin is warm and dry.   Neurological:      Mental Status: She is alert.   Psychiatric:         Mood and Affect: Mood normal.         Behavior: Behavior normal. Behavior is cooperative.         Thought Content: Thought content normal.         Judgment: Judgment normal.          Assessment and Plan:  Patient hx of bilateral breast DIEP reconstruction with revisions.   Encouraged patient to proceed with NAC tattoos as she would like.    Updated photos in mirror.   RTC as needed.

## 2021-10-13 ENCOUNTER — Encounter: Admit: 2021-10-13 | Discharge: 2021-10-13 | Payer: BC Managed Care – PPO

## 2021-10-13 ENCOUNTER — Ambulatory Visit: Admit: 2021-10-13 | Discharge: 2021-10-14 | Payer: BC Managed Care – PPO

## 2021-10-13 DIAGNOSIS — Z853 Personal history of malignant neoplasm of breast: Secondary | ICD-10-CM

## 2021-10-13 DIAGNOSIS — D229 Melanocytic nevi, unspecified: Secondary | ICD-10-CM

## 2021-10-13 DIAGNOSIS — D1801 Hemangioma of skin and subcutaneous tissue: Secondary | ICD-10-CM

## 2021-10-13 DIAGNOSIS — L853 Xerosis cutis: Secondary | ICD-10-CM

## 2021-10-13 DIAGNOSIS — D239 Other benign neoplasm of skin, unspecified: Secondary | ICD-10-CM

## 2021-10-13 DIAGNOSIS — B351 Tinea unguium: Secondary | ICD-10-CM

## 2021-10-13 DIAGNOSIS — L589 Radiodermatitis, unspecified: Secondary | ICD-10-CM

## 2021-10-13 MED ORDER — CICLOPIROX 8 % TP SOLN
6 refills | Status: AC
Start: 2021-10-13 — End: ?

## 2021-10-13 NOTE — Progress Notes
Date of Service: 10/13/2021    Subjective:             Jennifer Durham is a 46 y.o. female.    History of Present Illness    Return patient. LV 03/2020 w/ Dr. Evie Lacks.    # Brown spots on the head, trunk, arms, and legs  - Patient has a history of brown and tan spots distributed over the head, trunk, arms and legs.    - These have been present for many years.   - There is history of blistering sunburns. Remote history of tanning bed use 20 years ago, 2 x per week.   - None are itching, bleeding, or painful  - none are changing    # Dry skin  - Has been experiencing dry skin recently, especially on lower extremities  - Usually flares in colder weather  - Uses CeraVe moisturizer once daily    # Radiation rash  - Upper mid chest  - sometimes it can be itchy  Current Tx:  - Triamcinolone 0.1% ointment BID PRN  Interval Hx:  - Area now much improved, rarely uses TAC    Personal Hx: no history of skin cancer. history of breast cancer (ER+, PR+, HER2-).   Family Hx: no history of melanoma skin cancer  Social Hx: Runner, broadcasting/film/video. Nonsmoker.        Review of Systems   Constitutional: Negative for appetite change and unexpected weight change.   Gastrointestinal: Negative for diarrhea, nausea and vomiting.         Objective:         ? escitalopram oxalate (LEXAPRO) 10 mg tablet TAKE ONE TABLET BY MOUTH EVERY DAY   ? levothyroxine (SYNTHROID) 50 mcg tablet Take one tablet by mouth daily.   ? metFORMIN (GLUCOPHAGE) 1,000 mg tablet Take one tablet by mouth twice daily with meals.   ? montelukast (SINGULAIR) 10 mg tablet Take one tablet by mouth at bedtime daily.   ? tamoxifen (NOLVADEX) 20 mg tablet Take one tablet by mouth at bedtime daily.   ? traMADoL (ULTRAM) 50 mg tablet Take one tablet to two tablets by mouth every 8 hours as needed for Pain.   ? triamterene-hydrochlorothiazide (MAXZIDE) 75-50 mg tablet Take one tablet by mouth daily.     Vitals:    10/13/21 0837   PainSc: Zero   Weight: (!) 143.3 kg (316 lb)   Height: 182.9 cm (6')     Body mass index is 42.86 kg/m?Marland Kitchen     Physical Exam  Gen: No acute distress, awake and alert  Eyes: No conjunctival injection, EOM grossly normal    Areas Examined (all normal unless noted below):  Head/Face  Neck  Chest/axillae  Back  Abdomen  R upper ext  L upper ext  R lower ext  L lower ext    Breast/Groin exam declined    Pertinent findings include:    Multiple brown and tan evenly pigmented macules are distributed over the examined areas.  All have symmetric similar dermoscopic findings with primarily globular and reticular patterns.    Pink, violaceous papules with positive dimple sign and central starburst pattern with peripheral fine tan reticular network on dermoscopy    Bright red, pink macules and papules distributed over torso and extremities    Skin-colored, exophytic papules located on neck    Yellowish green, dystrophic toenail of right 1st digit    Some fine scaling noted throughout scalp    Xerosis noted of bilateral lower legs  Assessment and Plan:  # Onychomycosis  - Discussed diagnosis and treatment options including observation, topical therapy and oral therapy  - Start Rx: Topical Ciclopirox, apply to entire affected toenail(s) once daily  - Discussed that the whole toenail needs to recycle to be cured, this may take 1-1.5 years    # Xerosis of skin  - Gentle moisturizer BID to entire body, provided with recommendations  - decrease shower time and use lukewarm water  - only use body wash to axillae, groin, feet    # History of breast cancer  # Melanocytic nevi  - Will cont to monitor  - RTC for new/changing lesions  - provided patient with information on ABCD's of melanoma and photoprotection (including OTC sunscreen SPF 30+), and vitamin D    # Dermatofibroma  - reassurance of benign nature  - discussed excision if pt bothered by lesion    # Cherry Angiomas  - reassurance   - explained benign nature    # Radiation dermatitis  - Patient reports mostly resolved  - if symptomatic with itching, may use triamcinolone 0.1% cream BID prn up to 2 weeks per month. Do not use on face/armpits/groin/normal skin.     RTC in 12 months or sooner prn

## 2021-10-13 NOTE — Progress Notes
ATTESTATION    I personally performed the key portions of the E/M visit, discussed case with resident and concur with resident documentation of history, physical exam, assessment, and treatment plan unless otherwise noted.    Staff name:  Edwen Mclester Date:  10/13/2021

## 2021-10-16 ENCOUNTER — Encounter: Admit: 2021-10-16 | Discharge: 2021-10-16 | Payer: BC Managed Care – PPO

## 2021-10-19 ENCOUNTER — Encounter: Admit: 2021-10-19 | Discharge: 2021-10-19 | Payer: BC Managed Care – PPO

## 2021-10-19 ENCOUNTER — Ambulatory Visit: Admit: 2021-10-19 | Discharge: 2021-10-19 | Payer: BC Managed Care – PPO

## 2021-10-19 DIAGNOSIS — R5383 Other fatigue: Secondary | ICD-10-CM

## 2021-10-19 DIAGNOSIS — R000 Tachycardia, unspecified: Secondary | ICD-10-CM

## 2021-10-19 DIAGNOSIS — R0600 Dyspnea, unspecified: Secondary | ICD-10-CM

## 2021-10-22 ENCOUNTER — Encounter: Admit: 2021-10-22 | Discharge: 2021-10-22 | Payer: BC Managed Care – PPO

## 2021-10-22 NOTE — Telephone Encounter
10-22-2021 Per Task Message, requests faxed to Dr Earvin Hansen,   (F) [phone], and [facility], (F) [phone], I called [facility] to request Images from [facility], the MR [facility] told me they are not able to Cloud/Powershare images and not able to send CD, only the report, STRANGE"!!

## 2021-11-03 ENCOUNTER — Ambulatory Visit: Admit: 2021-11-03 | Discharge: 2021-11-03 | Payer: BC Managed Care – PPO

## 2021-11-03 ENCOUNTER — Encounter: Admit: 2021-11-03 | Discharge: 2021-11-03 | Payer: BC Managed Care – PPO

## 2021-11-03 DIAGNOSIS — R0602 Shortness of breath: Secondary | ICD-10-CM

## 2021-11-03 DIAGNOSIS — I10 Essential (primary) hypertension: Secondary | ICD-10-CM

## 2021-11-05 ENCOUNTER — Encounter: Admit: 2021-11-05 | Discharge: 2021-11-05 | Payer: BC Managed Care – PPO

## 2021-11-05 MED ORDER — ESCITALOPRAM OXALATE 10 MG PO TAB
ORAL_TABLET | 1 refills | Status: AC
Start: 2021-11-05 — End: ?

## 2021-11-06 ENCOUNTER — Encounter: Admit: 2021-11-06 | Discharge: 2021-11-06 | Payer: BC Managed Care – PPO

## 2021-11-06 DIAGNOSIS — Z79810 Long term (current) use of selective estrogen receptor modulators (SERMs): Secondary | ICD-10-CM

## 2021-11-06 DIAGNOSIS — E282 Polycystic ovarian syndrome: Secondary | ICD-10-CM

## 2021-11-06 DIAGNOSIS — E039 Hypothyroidism, unspecified: Secondary | ICD-10-CM

## 2021-11-06 DIAGNOSIS — F32A Depression: Secondary | ICD-10-CM

## 2021-11-06 DIAGNOSIS — C50919 Malignant neoplasm of unspecified site of unspecified female breast: Secondary | ICD-10-CM

## 2021-11-06 DIAGNOSIS — C50411 Malignant neoplasm of upper-outer quadrant of right female breast: Secondary | ICD-10-CM

## 2021-11-06 DIAGNOSIS — F419 Anxiety disorder, unspecified: Secondary | ICD-10-CM

## 2021-11-06 DIAGNOSIS — T7840XA Allergy, unspecified, initial encounter: Secondary | ICD-10-CM

## 2021-11-06 DIAGNOSIS — R000 Tachycardia, unspecified: Secondary | ICD-10-CM

## 2021-11-06 DIAGNOSIS — I10 Essential (primary) hypertension: Secondary | ICD-10-CM

## 2021-11-06 DIAGNOSIS — Z789 Other specified health status: Secondary | ICD-10-CM

## 2021-11-06 DIAGNOSIS — J302 Other seasonal allergic rhinitis: Secondary | ICD-10-CM

## 2021-11-06 DIAGNOSIS — Z923 Personal history of irradiation: Secondary | ICD-10-CM

## 2021-11-06 DIAGNOSIS — Z08 Encounter for follow-up examination after completed treatment for malignant neoplasm: Secondary | ICD-10-CM

## 2021-11-06 DIAGNOSIS — R12 Heartburn: Secondary | ICD-10-CM

## 2021-11-06 DIAGNOSIS — E88819 Insulin resistance: Secondary | ICD-10-CM

## 2021-11-06 LAB — ESTRADIOL (E2): ESTRADIOL: 80 pg/mL

## 2021-11-06 MED ORDER — GOSERELIN 10.8 MG SC IMPL
10.8 mg | Freq: Once | SUBCUTANEOUS | 0 refills
Start: 2021-11-06 — End: ?

## 2021-11-06 MED ORDER — LIDOCAINE (PF) 10 MG/ML (1 %) IJ SOLN
2 mL | Freq: Once | SUBCUTANEOUS | 0 refills
Start: 2021-11-06 — End: ?

## 2021-11-06 NOTE — Progress Notes
Name: Jennifer Durham          MRN: [id number]      DOB: May 24, 1975      AGE: 46 y.o.   DATE OF SERVICE: 11/06/2021    Subjective:             Reason for Visit:  Follow Up      Jennifer Durham is a 46 y.o. female.      Cancer Staging   Malignant neoplasm of upper-outer quadrant of right breast in female, estrogen receptor positive   Staging form: Breast, AJCC 8th Edition  - Clinical stage from 07/04/2019: Stage IA (cT1c, cN0(f), cM0, G2, ER+, PR+, HER2-) - Signed by Guy Begin, PA-C on 07/12/2019  - Pathologic stage from 08/21/2019: Stage IA (pT1c, pN1a, cM0, G2, ER+, PR+, HER2-) - Signed by Guy Begin, PA-C on 08/30/2019      History of Present Illness    DIAGNOSIS: right breast cancer   PAST ONCOLOGY HISTORY: Jennifer Durham is a 46 year old pre menopausal female who noted right breast dimpling in May 2021. She was sent for diagnostic imaging at an outside facility on 07/04/19. At that time a new right breast mass was seen as well as a lymph node with cortical thickening and biopsy was recommended. Right breast biopsy 07/04/2019 [name]) showed invasive carcinoma with mixed ductal and lobular features, grade II, ER 91-100%, PR 91-100%, HER2 1+ IHC. Right axilla biopsy showed fragments of polymorphous lymphoid tissue and no malignancy seen. Jennifer Durham underwent right modified radical mastectomy on 08/21/2019.  Tumor size was 1.8 cm, invasive ductal carcinoma.  Grade 2.  1 out of 34 lymph nodes was positive.  The size of LN metastasis was 5 mm.  ER was 96%, PR 99%,, HER-2 0+ by IHC.  Oncotype DX score 11. We recommended adjuvant adjuvant TC every 3 weeks x4 started 09/26/2019. She completed 4 cycles 11/28/2019.   ?  BREAST IMAGING:  Mammogram:    -- Bilateral diagnostic mammogram 07/04/19 [name]) revealed heterogeneously dense breast tissue. Spiculated mass with surrounding architectural distortion measuring up to 2 cm in the upper right medial breast at 10-11:00, 7.7 cm posterior to the nipple. Slightly prominent asymmetric right axillary lymph nodes.  -- Right diagnostic mammogram 07/16/19 ([facility]) revealed an the outside right MLO view from 07/04/2019, there was a small mass in the probable 9:00 position of the right breast at posterior depth, 2 cm inferior and 2 cm posterior to the known malignancy. Additional mammographic images will be performed to determine location. 2-D and 3-D images of the right breast were obtained.   In the 9:30 to 10:00 position of the right breast at middle to posterior depth, there was a 1.7 x 1.9 cm spiculated mass with internal tissue marker clip corresponding to biopsy-proven   malignancy. The biopsy-proven malignancy is approximately 8 cm posterior to the right nipple. In the 8:30 to 9:00 position of the right breast at posterior depth, there was an 0.8 cm lobular equal density mass which was not definitively seen on 2-D mammogram images from 03/21/2013.   ?  Ultrasound:    -- Right breast ultrasound 07/04/19 [facility]) revealed hypoechoic spiculated mass with surrounding architectural distortion with in the right upper breast at 10:00, 6 cm FTN. Significant posterior acoustic shadowing. Overall extent 1.3 x 1.7 x 1.6 cm. Increased cortical mantle thickening of a right axillary lymph node measuring up to 4 mm. BI-RADS 5.   -- Targeted right breast ultrasound 07/16/19 ([facility]) revealed at  9:30, 9 cm from the nipple, demonstrated a 1.4 x 1.8 x 1.6 cm irregular hypoechoic mass with internal tissue marker clip corresponding to the biopsy-proven malignancy. In the right breast at 9:00, 12 cm from the nipple, there ws an 0.8 cm morphologically normal small  intramammary lymph node corresponding to the additional mammographic mass identified. No suspicious right axillary lymph nodes were seen. 3 morphologically normal right axillary lymph nodes were identified. The lymph node labeled #3, likely contains the internal tissue marker clip from outside biopsy.     Jennifer Durham underwent right modified radical mastectomy on 08/21/2019.  Tumor size was 1.8 cm, invasive ductal carcinoma.  Grade 2.  1 out of 34 lymph nodes was positive.  The size of LN metastasis was 5 mm.  ER was 96%, PR 99%,, HER-2 0+ by IHC. Oncotype Dx RS 11.   She completed 4 cycles of adjuvant TC 11/28/2019.    She completed radiation with Dr Isabella Stalling. She is enrolled on the MA39 trail.     She completed radiation with Dr Isabella Stalling 03/11/2020.            OB/GYN HISTORY: Age at onset of menstruation 13. G2P2. She had her first child at age 6. LMP 03/2016 due to Mirena placement. Uterus and ovaries intact.     PRESENT THERAPY: Tamoxifen 20mg  daily started 12/2019    Jennifer Durham is here today for continued follow up. She denies breast concerns today. She has been following with Cardiology for recent tachycardia and had a stress test performed which is pending. She has not been seen by her OB-GYN for a pelvic exam since 12/2019.She was seen by her PCP about one month ago. She is currently on Lexapro 10 mg which is helpful for mood stabilization. Her hot flashes are stable and tolerable at this time.      Review of Systems   Constitutional: Negative for activity change and fatigue.        + hot flashes    HENT: Negative.    Eyes: Negative.    Respiratory: Negative for cough and shortness of breath.    Cardiovascular: Negative for chest pain, palpitations and leg swelling.   Gastrointestinal: Negative for abdominal pain, constipation, diarrhea, nausea and vomiting.   Endocrine: Negative.    Genitourinary: Negative.         Pre-menopausal   LMP 03/2016  Copper IUD in place   Uterus and ovaries intact    Musculoskeletal: Negative for arthralgias and myalgias.   Skin: Negative.    Allergic/Immunologic: Negative.    Neurological: Negative for dizziness, weakness, light-headedness and headaches.   Hematological: Negative.    Psychiatric/Behavioral: The patient is nervous/anxious.        Allergies   Allergen Reactions   ? Tegaderm RASH       Objective:         ? CHOLEcalciferoL (vitamin D3) (OPTIMAL D3) 50,000 units capsule TAKE ONE CAPSULE BY MOUTH EVERY WEEK   ? ciclopirox (PENLAC) 8 % topical solution Apply topically to entire affected nail(s) once daily.  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 escitalopram oxalate (LEXAPRO) 10 mg tablet TAKE ONE TABLET BY MOUTH EVERY DAY   ? levothyroxine (SYNTHROID) 50 mcg tablet Take one tablet by mouth daily.   ? metFORMIN (GLUCOPHAGE) 1,000 mg tablet Take one tablet by mouth twice daily with meals.   ? montelukast (SINGULAIR) 10 mg tablet Take one tablet by mouth at bedtime daily.   ?  tamoxifen (NOLVADEX) 20 mg tablet Take one tablet by mouth at bedtime daily.   ? traMADoL (ULTRAM) 50 mg tablet Take one tablet to two tablets by mouth every 8 hours as needed for Pain.   ? triamterene-hydrochlorothiazide (MAXZIDE) 75-50 mg tablet Take one tablet by mouth daily.     Vitals:    11/06/21 1031   BP: 106/89   BP Source: Arm, Left Upper   Pulse: 103   Temp: 36.7 ?C (98 ?F)   Resp: 16   SpO2: 98%   PainSc: Zero   Weight: (!) 142.9 kg (315 lb)     Body mass index is 43.93 kg/m?Marland Kitchen     Pain Score: Zero       Fatigue Scale: 5    Pain Addressed:  N/A    Patient Evaluated for a Clinical Trial: No treatment clinical trial available for this patient.     [place] Cooperative Oncology Group performance status is 0, Fully active, able to carry on all pre-disease performance without restriction.Marland Kitchen     Physical Exam  Vitals reviewed.   Constitutional:       General: She is not in acute distress.  Eyes:      General:         Right eye: No discharge.         Left eye: No discharge.      Extraocular Movements: Extraocular movements intact.      Conjunctiva/sclera: Conjunctivae normal.      Pupils: Pupils are equal, round, and reactive to light.   Cardiovascular:      Rate and Rhythm: Regular rhythm. Tachycardia present.      Pulses: Normal pulses.      Heart sounds: No murmur heard.  Pulmonary:      Effort: Pulmonary effort is normal. No respiratory distress.   Chest:   Breasts:     Right: No mass, skin change or tenderness.      Left: No mass, skin change or tenderness.          Comments: No palpable abnormalities on examination today.  Musculoskeletal:         General: Normal range of motion.      Cervical back: Normal range of motion.      Right lower leg: No edema.      Left lower leg: No edema.   Lymphadenopathy:      Cervical: No cervical adenopathy.      Upper Body:      Right upper body: No supraclavicular or axillary adenopathy.      Left upper body: No supraclavicular or axillary adenopathy.   Skin:     General: Skin is warm and dry.   Neurological:      General: No focal deficit present.      Mental Status: She is alert and oriented to person, place, and time.   Psychiatric:         Mood and Affect: Mood normal.         Behavior: Behavior normal.         Thought Content: Thought content normal.         Judgment: Judgment normal.       LABS:  Estradiol 04/07/2020: <15.0  Estradiol 10/28/2020: <15.0  Estradiol 05/01/2021: <15.0  Estradiol 11/06/2021: 80.9          Assessment and Plan:    1. 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   2.  Jennifer Durham underwent right modified radical mastectomy on 08/21/2019.  Tumor size was 1.8 cm, invasive ductal carcinoma.  Grade 2.  1 out of 34 lymph nodes was positive.  The size of LN metastasis was 5 mm.  ER was 96%, PR 99%,, HER-2 0+ by IHC. Oncotype Dx RS 11.   3.  Since Jennifer Durham is premenopausal and has node positive disease, we recommend adjuvant chemotherapy.  After discussing the options of AC-T chemotherapy versus TC chemotherapy, we decided to proceed with TC for four cycles.  We discussed the data based on ABC trials and breast cancer.  Jennifer Durham has significant history of heart disease in the family and is very concerned about Adriamycin causing heart damage.  Therefore TC seemed like better choice with equivalent efficacy based on trials.  She completed 4 cycles of TC 11/28/2019.    4. She completed radiation with Dr Isabella Stalling. She is enrolled on the MA39 trail.   5. Due to her cancer being ER+; we recommended endocrine therapy for 5-10 years. She started Tamoxifen 20mg  daily 12/2019. She was told the side effects including hot flashes, uterine cancer, blood clots and cataracts. She will continue to see GYN annually for pelvis exam, last seen 12/2019. Patient plans to make an appointment for a pelvic exam now.  E2 levels checked today and they are elevated. Discussed with the patient that we will start OFS with Zoladex injections every 3 months.   6. Hot flashes and mood changes; continue Lexapro 10mg .  7. Tachycardia; continue to follow with Cardiology for evaluation and management.   8. RTC in 6 months for a breast cancer follow-up visit.     My collaborating MD Dr Raul Del.     Cordie Grice Sistrunk, APRN-NP

## 2021-11-09 ENCOUNTER — Encounter: Admit: 2021-11-09 | Discharge: 2021-11-09 | Payer: BC Managed Care – PPO

## 2021-11-09 MED ORDER — LIDOCAINE (PF) 10 MG/ML (1 %) IJ SOLN
2 mL | Freq: Once | SUBCUTANEOUS | 0 refills
Start: 2021-11-09 — End: ?

## 2021-11-09 MED ORDER — GOSERELIN 10.8 MG SC IMPL
10.8 mg | Freq: Once | SUBCUTANEOUS | 0 refills
Start: 2021-11-09 — End: ?

## 2021-11-11 ENCOUNTER — Encounter: Admit: 2021-11-11 | Discharge: 2021-11-11 | Payer: BC Managed Care – PPO

## 2021-11-19 ENCOUNTER — Encounter: Admit: 2021-11-19 | Discharge: 2021-11-19 | Payer: BC Managed Care – PPO

## 2021-11-19 DIAGNOSIS — C50411 Malignant neoplasm of upper-outer quadrant of right female breast: Secondary | ICD-10-CM

## 2021-11-19 MED ORDER — LIDOCAINE (PF) 10 MG/ML (1 %) IJ SOLN
2 mL | Freq: Once | SUBCUTANEOUS | 0 refills | Status: CP
Start: 2021-11-19 — End: ?
  Administered 2021-11-19: 19:00:00 2 mL via SUBCUTANEOUS

## 2021-11-19 MED ORDER — GOSERELIN 10.8 MG SC IMPL
10.8 mg | Freq: Once | SUBCUTANEOUS | 0 refills | Status: CP
Start: 2021-11-19 — End: ?
  Administered 2021-11-19: 19:00:00 10.8 mg via SUBCUTANEOUS

## 2021-11-19 NOTE — Progress Notes
Urine specimen obtained.  Pt is currently taking Tamoxifen and has an IUD.  Education provided.  Lidocaine and Zoladex administered in LLQ of abdomen.

## 2021-11-25 ENCOUNTER — Encounter: Admit: 2021-11-25 | Discharge: 2021-11-25 | Payer: BC Managed Care – PPO

## 2021-11-30 ENCOUNTER — Encounter: Admit: 2021-11-30 | Discharge: 2021-11-30 | Payer: BC Managed Care – PPO

## 2021-11-30 DIAGNOSIS — F419 Anxiety disorder, unspecified: Secondary | ICD-10-CM

## 2021-11-30 DIAGNOSIS — J302 Other seasonal allergic rhinitis: Secondary | ICD-10-CM

## 2021-11-30 DIAGNOSIS — F32A Depression: Secondary | ICD-10-CM

## 2021-11-30 DIAGNOSIS — E282 Polycystic ovarian syndrome: Secondary | ICD-10-CM

## 2021-11-30 DIAGNOSIS — Z923 Personal history of irradiation: Secondary | ICD-10-CM

## 2021-11-30 DIAGNOSIS — T7840XA Allergy, unspecified, initial encounter: Secondary | ICD-10-CM

## 2021-11-30 DIAGNOSIS — R12 Heartburn: Secondary | ICD-10-CM

## 2021-11-30 DIAGNOSIS — C50919 Malignant neoplasm of unspecified site of unspecified female breast: Secondary | ICD-10-CM

## 2021-11-30 DIAGNOSIS — Z789 Other specified health status: Secondary | ICD-10-CM

## 2021-11-30 DIAGNOSIS — I10 Essential (primary) hypertension: Secondary | ICD-10-CM

## 2021-11-30 DIAGNOSIS — E88819 Insulin resistance: Secondary | ICD-10-CM

## 2021-11-30 DIAGNOSIS — E039 Hypothyroidism, unspecified: Secondary | ICD-10-CM

## 2021-12-01 ENCOUNTER — Encounter: Admit: 2021-12-01 | Discharge: 2021-12-01 | Payer: BC Managed Care – PPO

## 2021-12-01 DIAGNOSIS — F32A Depression: Secondary | ICD-10-CM

## 2021-12-01 DIAGNOSIS — T7840XA Allergy, unspecified, initial encounter: Secondary | ICD-10-CM

## 2021-12-01 DIAGNOSIS — N650 Deformity of reconstructed breast: Secondary | ICD-10-CM

## 2021-12-01 DIAGNOSIS — Z136 Encounter for screening for cardiovascular disorders: Secondary | ICD-10-CM

## 2021-12-01 DIAGNOSIS — R000 Tachycardia, unspecified: Secondary | ICD-10-CM

## 2021-12-01 DIAGNOSIS — F419 Anxiety disorder, unspecified: Secondary | ICD-10-CM

## 2021-12-01 DIAGNOSIS — Z923 Personal history of irradiation: Secondary | ICD-10-CM

## 2021-12-01 DIAGNOSIS — R12 Heartburn: Secondary | ICD-10-CM

## 2021-12-01 DIAGNOSIS — E039 Hypothyroidism, unspecified: Secondary | ICD-10-CM

## 2021-12-01 DIAGNOSIS — Z9889 Other specified postprocedural states: Secondary | ICD-10-CM

## 2021-12-01 DIAGNOSIS — C50919 Malignant neoplasm of unspecified site of unspecified female breast: Secondary | ICD-10-CM

## 2021-12-01 DIAGNOSIS — E282 Polycystic ovarian syndrome: Secondary | ICD-10-CM

## 2021-12-01 DIAGNOSIS — E88819 Insulin resistance: Secondary | ICD-10-CM

## 2021-12-01 DIAGNOSIS — E669 Obesity, unspecified: Secondary | ICD-10-CM

## 2021-12-01 DIAGNOSIS — R0609 Other forms of dyspnea: Secondary | ICD-10-CM

## 2021-12-01 DIAGNOSIS — R55 Syncope and collapse: Secondary | ICD-10-CM

## 2021-12-01 DIAGNOSIS — Z789 Other specified health status: Secondary | ICD-10-CM

## 2021-12-01 DIAGNOSIS — I10 Essential (primary) hypertension: Secondary | ICD-10-CM

## 2021-12-01 DIAGNOSIS — J302 Other seasonal allergic rhinitis: Secondary | ICD-10-CM

## 2021-12-01 MED ORDER — NEBIVOLOL 10 MG PO TAB
ORAL_TABLET | ORAL | 0 refills | 60.00000 days | Status: AC
Start: 2021-12-01 — End: ?

## 2021-12-01 NOTE — Progress Notes
Date of Service: 12/01/2021    Jennifer Durham is a 46 y.o. female.       Chief Complaint: Heart racing/dyspnea on exertion    History of Present Illness:      Dictation on: 12/01/2021  2:09 PM by: Jennifer Durham [[email or web address]]            Past Medical History:  Patient Active Problem List    Diagnosis Date Noted   ? Hypertension 11/30/2021     11/03/2021 Carotid duplex: Impression: No evidence of a hemodynamically significant stenosis or plaque formation was identified in the carotid vessels. Bilateral vertebral antegrade flow is identified.      ? PCOS (polycystic ovarian syndrome) 11/30/2021   ? Acquired absence of bilateral breasts and nipples 08/02/2021   ? Deformity and disproportion of reconstructed breast 01/22/2021   ? Abdominal wall seroma 08/05/2020   ? S/P flap graft 07/10/2020   ? AKI (acute kidney injury) (HCC) 07/04/2020   ? Morbid obesity (HCC) 07/04/2020   ? Tachycardia 07/04/2020   ? S/P breast reconstruction 07/02/2020   ? Obesity (BMI 35.0-39.9 without comorbidity) 05/06/2020   ? History of external beam radiation therapy 05/06/2020   ? Breast cancer in female [facility]) 08/21/2019   ? 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 is a Caucasian female who presented to the [facility] on 07/16/2019 at age 32 for evaluation of right breast cancer. [name] noted right breast dimpling in May 2021. She was sent for diagnostic imaging at an outside facility on 07/04/19. At that time a new right breast mass was seen as well as a lymph node with cortical thickening and biopsy was recommended. Right breast sono-guided biopsy 07/04/19 [name]) revealed grade 2 invasive cancer with mixed ductal and lobular features. Right axillary sono-guided biopsy 07/04/19 [name]) revealed fragments of polymorphous lymphoid tissue. No evidence of malignancy. [name] underwent bilateral skin sparing mastectomy/right ALND/TEs on 08/21/19. She finished radiation with Dr. Isabella Stalling on 03/11/20.    PATHOLOGY:  Tumor:  1.8 cm IDC  Margins Free From Tumor:  Yes  ER:  positive   PR:  positive    Her 2:  negative  Grade:  2  Lymph Nodes:  1/34  LVSI:  no  Extranodal extension:  no      BREAST IMAGING:  Mammogram:    -- Bilateral diagnostic mammogram 07/04/19 [name]) revealed heterogeneously dense breast tissue. Spiculated mass with surrounding architectural distortion measuring up to 2 cm in the upper right medial breast at 10-11:00, 7.7 cm posterior to the nipple. Slightly prominent asymmetric right axillary lymph nodes.  -- Right diagnostic mammogram 07/16/19 ([place]) revealed an the outside right MLO view from 07/04/2019, there was a small mass in the probable 9:00 position of the right breast at posterior depth, 2 cm inferior and 2 cm posterior to the known malignancy. Additional mammographic images will be performed to determine location. 2-D and 3-D images of the right breast were obtained.   In the 9:30 to 10:00 position of the right breast at middle to posterior depth, there was a 1.7 x 1.9 cm spiculated mass with internal tissue marker clip corresponding to biopsy-proven   malignancy. The biopsy-proven malignancy is approximately 8 cm posterior to the right nipple. In the 8:30 to 9:00  position of the right breast at posterior depth, there was an 0.8 cm lobular equal density mass which was not definitively seen on 2-D mammogram images from 03/21/2013.       Ultrasound:    -- Right breast ultrasound 07/04/19 [facility]) revealed hypoechoic spiculated mass with surrounding architectural distortion with in the right upper breast at 10:00, 6 cm FTN. Significant posterior acoustic shadowing. Overall extent 1.3 x 1.7 x 1.6 cm. Increased cortical mantle thickening of a right axillary lymph node measuring up to 4 mm. BI-RADS 5.   -- Targeted right breast ultrasound 07/16/19 ([place]) revealed at 9:30, 9 cm from the nipple, demonstrated a 1.4 x 1.8 x 1.6 cm irregular hypoechoic mass with internal tissue marker clip corresponding to the biopsy-proven malignancy. In the right breast at 9:00, 12 cm from the nipple, there ws an 0.8 cm morphologically normal small  intramammary lymph node corresponding to the additional mammographic mass identified. No suspicious right axillary lymph nodes were seen. 3 morphologically normal right axillary lymph nodes were identified. The lymph node labeled #3, likely contains the internal tissue marker clip from outside biopsy.     REPRODUCTIVE HEALTH:  Age at first Menarche:   51  Age at First Live Birth:  43  Age at Menopause:  Premenopausal - has mirena  Gravida:  2  Para: 2  Breastfeeding:  Yes    PROCEDURE:   1. Bilateral skin sparing mastectomy/right ALND/TEs, 08/21/19  2. DIEP 07/02/20  3. Revision scheduled 08/10/21  PERTINENT PMH:  Insulin resistance, HTN  FAMILY HISTORY:  Mother-Breast cancer diagnosed in her early to mid 74's. Maternal Aunt-Breast cancer diagnosed in her 53's. Maternal Aunt-Ovarian cancer diagnosed in her early 7's  PHYSICAL EXAM on PRESENTATION: Right -  2cm palpable mass at 9:30 w/ associated skin dimpling. No nipple discharge. Left - No palpable breast masses. No skin, nipple, or areolar change. No supraclavicular or axillary adenopathy.   MEDICAL ONCOLOGY:  Dr. Welton Flakes  PRESENT THERAPY: Oncotype DX 11; Adjuvant TC finished 09/26/19; tamoxifen started 12/2019  REFERRED BY:  [facility]       ? Tear of peroneal tendon 12/15/2016       Family History: Mother passed away from a heart attack in her early 14s.  Her maternal grandfather passed away from a heart attack in his early 41s.  Her father has a bicuspid aortic valve which underwent replacement.    Social History: She is originally from a small town in [address] [place].  [place] too far from [place], [place].  She lives in [place], [place].  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  She teaches Immunologist and coding.  She is married.  2 children.  A son who is 36 years old and currently at [employer] and a daughter who is 59 years old and is a Biochemist, clinical in high school.  This is as of November 2023.  She drinks 3 glasses of unsweetened tea a week.  She gets 64 to 96 ounces of water a day.  Lifelong non-smoker.  2 alcoholic beverages a month.    Review of Systems   Constitutional: Negative.   HENT: Negative.    Eyes: Negative.    Cardiovascular: Positive for claudication, irregular heartbeat and palpitations.   Respiratory: Positive for shortness of breath.    Endocrine: Negative.    Hematologic/Lymphatic: Negative.    Skin: Positive for dry skin.   Musculoskeletal: Positive for back pain.   Gastrointestinal: Negative.    Genitourinary: Negative.  Neurological: Negative.    Psychiatric/Behavioral: Negative.    Allergic/Immunologic: Negative.        Vitals:    12/01/21 1207   BP: 126/78   BP Source: Arm, Left Lower   Pulse: 119   SpO2: 98%   O2 Device: None (Room air)   PainSc: Zero   Weight: (!) 140.2 kg (309 lb)   Height: 180.3 cm (5' 11)     Body mass index is 43.1 kg/m?Marland Kitchen    Physical Examination:  General Appearance: No acute distress. Fully alert and oriented.  Skin: Warm. No ulcers or xanthomas.   HEENT: Grossly unremarkable. Lips and oral mucosa without pallor or cyanosis. Moist mucous membranes.   Neck Veins: Normal jugular venous pressure. Neck veins are not distended.  Carotid Arteries: Normal carotid upstroke bilaterally. No bruits.  Chest Inspection: Chest is normal in appearance.  Auscultation/Percussion: Normal respiratory effort. Lungs clear to auscultation bilaterally. No wheezes, rales, or rhonchi.    Cardiac Rhythm: Regular rhythm. Normal rate.  Cardiac Auscultation: Normal S1 & S2. No S3 or S4. No rub.  Murmurs: No cardiac murmurs.  Peripheral Circulation: Normal peripheral circulation.   Abdominal Aorta: No abdominal aortic bruit.  Extremities: Appropriately warm to touch. No lower extremity edema.  Abdominal Exam: Soft, non-tender. No masses, no organomegaly. Normal bowel sounds.  Neurologic Exam: Neurological assessment grossly intact.       Assessment and Plan:   Dictation on: 12/01/2021  2:13 PM by: Jennifer Durham [[email or web address]]              Total time spent on today's office visit was 55 minutes. This includes face-to-face in person visit with patient as well as non face-to-face time including review of the electronic medical record, outside records, labs, radiologic studies, cardiovascular studies, formulation of treatment plan, after visit summary, future disposition, personal discussions, and documentation.    Current Medications (including today's revisions)  ? CHOLEcalciferoL (vitamin D3) (OPTIMAL D3) 50,000 units capsule TAKE ONE CAPSULE BY MOUTH EVERY WEEK   ? ciclopirox (PENLAC) 8 % topical solution Apply topically to entire affected nail(s) once daily.  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 escitalopram oxalate (LEXAPRO) 10 mg tablet TAKE ONE TABLET BY MOUTH EVERY DAY   ? levothyroxine (SYNTHROID) 50 mcg tablet Take one tablet by mouth daily.   ? metFORMIN (GLUCOPHAGE) 1,000 mg tablet Take one tablet by mouth twice daily with meals.   ? montelukast (SINGULAIR) 10 mg tablet Take one tablet by mouth at bedtime daily.   ? tamoxifen (NOLVADEX) 20 mg tablet Take one tablet by mouth at bedtime daily.   ? triamterene-hydrochlorothiazide (MAXZIDE) 75-50 mg tablet Take one tablet by mouth daily.

## 2021-12-01 NOTE — Patient Instructions
Coronary CT Angiography Instructions    Jennifer Durham  [id number]  1975-05-24  12/01/2021    ARRIVAL TIME    Please report to the Cardiovascular Medicine Clinic at the [facility] on: 01/05/22  Please arrive at the following time: 10:30 for 11:00 ccta        DO NOT EAT FOR 4 HOURS PRIOR TO YOUR PROCEDURE.MAY HAVE TOAST OR FRUIT BEFORE 7AM  YOU SHOULD DRINK PLENTY OF CLEAR LIQUIDS UP TO ARRIVAL AT OFFICE.    Pre-Procedure Heart Rate Medication Instructions: take all AM meds except do NOT take Metformin the AM of test  -tAKE BYSTOLIC 10MG  ON 12/11 @ 7PM AND 12/12 @ 7am (CALLED TO PHARMACY) It is important to take these medications exactly as written.  These medications prepare you for the procedure.    Do Not Take Metformin The Day Of The Procedure And Do Not Take For 24 Hours After Procedure: Verified                                 Do not take any non-steroidal inflammatory medications, (ibuprofen, Aleve, Advil, etc.) for 48 hours beginning the day of the procedure.    Hold All Diuretics For 24 Hours Beginning The Day Of The Procedure: Verified    You May Take All Other Medications With Water The Morning Of The Procedure: Verified              ALLERGIES    Allergies   Allergen Reactions    Tegaderm RASH       SPECIAL ALLERGY INSTRUCTIONS         CURRENT MEDICATIONS  Outpatient Encounter Medications as of 12/01/2021   Medication Sig Dispense Refill    CHOLEcalciferoL (vitamin D3) (OPTIMAL D3) 50,000 units capsule TAKE ONE CAPSULE BY MOUTH EVERY WEEK      ciclopirox (PENLAC) 8 % topical solution Apply topically to entire affected nail(s) once daily.  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      metFORMIN (GLUCOPHAGE) 1,000 mg tablet Take one tablet by mouth twice daily with meals. montelukast (SINGULAIR) 10 mg tablet Take one tablet by mouth at bedtime daily.      [START ON 01/04/2022] nebivoloL (BYSTOLIC) 10 mg tablet Take Bystolic 10mg  on 12/11 @ 7PM and 12/12 @ 7AM  Indications: ccta prep 2 tablet 0    tamoxifen (NOLVADEX) 20 mg tablet Take one tablet by mouth at bedtime daily. 90 tablet 3     No facility-administered encounter medications on file as of 12/01/2021.       -No caffeine or smoking after midnight before the procedure.  -Please arrange for someone to drive you home after the procedure.  -No driving for 3 hours after the procedure.  -Please leave all valuables at home (wallet/purse).              PRE-PROCEDURE LAB WORK    Other Lab: I[facility]    If you have any questions, please call the [facility] office at [phone] or [phone].    Form Completed By: Kevan Ny rn  Date Completed: 12/01/21

## 2021-12-01 NOTE — Patient Instructions
72 hour monitor    Coronary CCTA    Discontinue diuretic.    Follow up as directed.  Call sooner if issues.  Call the [place] line at [phone].  Leave a detailed message for the nurse in [place] with how we can assist you and we will call you back.

## 2021-12-03 ENCOUNTER — Encounter: Admit: 2021-12-03 | Discharge: 2021-12-03 | Payer: BC Managed Care – PPO

## 2021-12-03 ENCOUNTER — Ambulatory Visit: Admit: 2021-12-03 | Discharge: 2021-12-03 | Payer: BC Managed Care – PPO

## 2021-12-03 NOTE — Progress Notes
To our valued patient,     We have enrolled your heart monitor and requested it to be mailed to your home.  You should receive this within 2-3 business days. Please wear the monitor for 3 days. When you have completed the study, please remove the device, and mail it back to the company. Please call [employer] Customer Service at [phone] with questions about placement, troubleshooting, and insurance coverage. You can reach the ambulatory heart monitor team at [phone].        ? Please write your NAME, PHYSICIAN, START DATE/TIME on the diary.    ? Prep skin by shaving and ensuring there is no lotion on the chest.  Gently abrade for clear ecgs.  ? Showering is okay, but best to shower with back to the water.    ? Please use the diary for symptoms - specific date and times and what you are feeling.  ? Please return the device in the mailer with diary promptly after completing the study.        Your Heart Rhythm Management Team  Cardiovascular Medicine Department at [facility]          Ambulatory (External) Cardiac Monitor Enrollment Record     Placement Location: Home Enrollment  Vendor: [employer] ([employer])  Mobile Cardiac Telemetry (MCOT/MCT)?: No  Duration of Monitor (in days): 3  Monitor Diagnosis: Tachycardia (R00.0)  No data recordedOrdering Provider: Allen Kell, MD  AMB Monitor Serial Number: home  No data recorded    Start Time and Date: 12/03/21 9:33 AM   Patient Name: Jennifer Durham  DOB: [birth year]  MRN: [id number]  Sex: female  Mobile Phone Number: [phone] (mobile)  Home Phone Number: [phone]  Patient Address: [address] [place] [address]  Insurance Coverage: BCBS [place] [employer] [place]  Insurance ID: [id number]  Insurance Group #: [id number]  Insurance Subscriber: [name],[name]  Implanted Cardiac Device Information: No results found for: [email or web address]      Patient instructed to contact company phone number on the monitor box with questions regarding billing, placement, troubleshooting.     Jennifer Durham    ____________________________________________________________    Clinic Staff:    ? Complete additional steps for documentation double check/Co-Sign.  ? In Follow-up, send chart upon closing encounter to [facility] HRM AMBULATORY MONITORS    HRM Ambulatory Monitoring Team:  1. Schedule on appropriate template and check-in.   Clinic Placement Schedule on clinic location [facility] schedule   Home Enrollment Schedule on Home Enrollment schedule ([facility])   Given to patient in clinic for self-placement Schedule on Home Enrollment schedule ([facility])   Inpatient Schedule on [place] [facility] AMBULATORY MONITORING template   2. Please enroll with appropriate vendor.

## 2021-12-07 ENCOUNTER — Encounter: Admit: 2021-12-07 | Discharge: 2021-12-07 | Payer: BC Managed Care – PPO

## 2021-12-31 ENCOUNTER — Encounter: Admit: 2021-12-31 | Discharge: 2021-12-31 | Payer: BC Managed Care – PPO

## 2021-12-31 ENCOUNTER — Ambulatory Visit: Admit: 2021-12-31 | Discharge: 2022-01-01 | Payer: BC Managed Care – PPO

## 2021-12-31 DIAGNOSIS — F32A Depression: Secondary | ICD-10-CM

## 2021-12-31 DIAGNOSIS — F419 Anxiety disorder, unspecified: Secondary | ICD-10-CM

## 2021-12-31 DIAGNOSIS — T7840XA Allergy, unspecified, initial encounter: Secondary | ICD-10-CM

## 2021-12-31 DIAGNOSIS — C50919 Malignant neoplasm of unspecified site of unspecified female breast: Secondary | ICD-10-CM

## 2021-12-31 DIAGNOSIS — E039 Hypothyroidism, unspecified: Secondary | ICD-10-CM

## 2021-12-31 DIAGNOSIS — Z923 Personal history of irradiation: Secondary | ICD-10-CM

## 2021-12-31 DIAGNOSIS — I10 Essential (primary) hypertension: Secondary | ICD-10-CM

## 2021-12-31 DIAGNOSIS — E785 Hyperlipidemia, unspecified: Secondary | ICD-10-CM

## 2021-12-31 DIAGNOSIS — E282 Polycystic ovarian syndrome: Secondary | ICD-10-CM

## 2021-12-31 DIAGNOSIS — R000 Tachycardia, unspecified: Secondary | ICD-10-CM

## 2021-12-31 DIAGNOSIS — R12 Heartburn: Secondary | ICD-10-CM

## 2021-12-31 DIAGNOSIS — Z789 Other specified health status: Secondary | ICD-10-CM

## 2021-12-31 DIAGNOSIS — J302 Other seasonal allergic rhinitis: Secondary | ICD-10-CM

## 2021-12-31 DIAGNOSIS — E88819 Insulin resistance: Secondary | ICD-10-CM

## 2021-12-31 DIAGNOSIS — E669 Obesity, unspecified: Secondary | ICD-10-CM

## 2021-12-31 MED ORDER — FLU VACC QS2023-24 6MOS UP(PF) 60 MCG (15 MCG X 4)/0.5 ML IM SYRG
.5 mL | Freq: Once | INTRAMUSCULAR | 0 refills | Status: CP
Start: 2021-12-31 — End: ?
  Administered 2021-12-31: 20:00:00 0.5 mL via INTRAMUSCULAR

## 2021-12-31 NOTE — Progress Notes
Annual Exam   CC - Well Woman      Date of service: 12/31/2021    Subjective:     History of present illness - Jennifer Durham is a 46 y.o. [phone] here today for Well Woman     She has a medical history significant for ER/PR positive breast cancer.  She is status postsurgical treatment, chemotherapy and radiation.  She is currently maintained on Zoladex followed by the cancer team at [facility].  She had a ParaGard IUD placed in 2021 that she uses for contraception.  She has been amenorrheic since her chemotherapy treatment.  She lives with her husband and feels safe at home.  She has questions about if she needs a hysterectomy for part of her cancer treatment.        Menstrual history  First day of your last menstrual period:: N/A. I have a iud  Length of cycle (days between last day of period and first day of your next period):: Normal  How many days does your period last?: 5  Describe your menstrual flow:: Heavy  Age of first period:: 13  Is your period painful?: Yes  Age of menopause (if applicable):: N/a  Have you had any bleeding after menopause?: No  Are you currently or have you previously taken hormone therapy?: Yes  If yes, what hormones and when did you start them?: 11/21    Pap smear history  When was your last Pap smear (+/- HPV testing)?: 12/22  Result:: Normal  At what office did you get tested?: This one  Do you have a history of an abnormal Pap smear and/or HPV test?: No     Obstetrical history  Total number of pregnancies:: 2  Full term:: 2  Preterm:: 0  Number of vaginal deliveries:: 2  Number of Cesarean sections:: 0  Number of living children:: 2  Miscarriages:: 0  Ectopic pregnancies:: 0  Surgical abortions:: 0  Any complications during pregnancy?: Other  If other, please explain:: None    Social History  Do you wear sunscreen?: Yes  How often do you wear a seat belt?: Always  Do you exercise?: Yes  If yes, how often?: 2-3 times per week  Do you consider your diet healthy?: Yes     Social History     Substance and Sexual Activity   Sexual Activity Yes    Partners: Male    Birth control/protection: I.U.D.       Over the past 2 weeks, how often have you been bothered by any of the following problems?  Little interest or pleasure in doing things: Not at All  Feeling down, depressed or hopeless: Not at All  PHQ-2 Score: 0  PHQ-2 Score less than 3: No follow-up or recommendations are necessary at this time    Annual Exam Questions  Date of your last mammogram:: 06/21  Result:: Abnormal. Mascetamy on 7/21  Have you ever had an abnormal mammogram?: Yes  Date of last colonoscopy:: N/A  Result:: N/a  Date of last bone density scan (DEXA):: N/a  Result:: N/a  Have you received the Gardasil HPV vaccine?: No  Have you received a flu vaccine this year?: No  When was your last tetanus/TDAP vaccine?: Unknown  Have you received a COVID-19 vaccine?: Yes    Medical History:   Diagnosis Date    Allergy     Anxiety disorder     Breast cancer (HCC) 07/04/2019    right    Depression 11/25/2021  Heartburn     Hx of radiation therapy 2022    Hyperlipemia 11/23    Hypertension     Hypothyroidism 09/2020    Insulin resistance     Limb alert care status     No venipuncture/BP's to RUE    Obesity     PCOS (polycystic ovarian syndrome)     Seasonal allergies     Frequent sinusitis    Tachyarrhythmia        STD History              No specialty history recorded             Surgical History:   Procedure Laterality Date    HX TONSILLECTOMY  2001    turbinates/adenoids 2015    HX TONSIL AND ADENOIDECTOMY  2011    LEFT PERONEAL TENDON REPAIR Left 12/15/2016    Performed by Tanja Port, MD at [facility] [place]    Left Skin Sparing Mastectomy Left 08/21/2019    Performed by Renford Dills, MD at [facility] OR    IDENTIFICATION SENTINEL LYMPH NODE Right 08/21/2019    Performed by Renford Dills, MD at [facility] OR    INJECTION RADIOACTIVE TRACER FOR SENTINEL NODE IDENTIFICATION Right 08/21/2019    Performed by Renford Dills, MD at [facility] OR RADIOLOGICAL EXAM SURGICAL SPECIMEN Right 08/21/2019    Performed by Renford Dills, MD at [facility] OR    MASTECTOMY - MODIFIED RADICAL WITH AXILLARY LYMPH NODE DISSECTION Right 08/21/2019    Performed by Renford Dills, MD at [facility] OR    RECONSTRUCTION BREAST WITH TISSUE EXPANDER AND SUBSEQUENT EXPANSION - IMMEDIATE/ DELAYED Bilateral 08/21/2019    Performed by Marlinda Mike, MD at [facility] OR    IMPLANTATION BIOLOGIC IMPLANT FOR SOFT TISSUE REINFORCEMENT Bilateral 08/21/2019    Performed by Marlinda Mike, MD at [facility] OR    RECONSTRUCTION BREAST WITH DEEP INFERIOR EPIGASTRIC PERFORATOR FLAP/ SUPERFICIAL INFERIOR EPIGASTRIC ARTERY FLAP Bilateral 07/02/2020    Performed by Marlinda Mike, MD at [facility] OR    RECONSTRUCTION BREAST WITH FREE FLAP - request 22 modifier, complicated procedure due to obesity and intramuscular course Bilateral 07/02/2020    Performed by Marlinda Mike, MD at [facility] OR    INTRAVENOUS INJECTION AGENT TO TEST VASCULAR FLOW IN FLAP/ GRAFT Bilateral 07/02/2020    Performed by Marlinda Mike, MD at [facility] OR    APPLICATION NEGATIVE PRESSURE WOUND THERAPY Bilateral 07/02/2020    Performed by Marlinda Mike, MD at [facility] [place]    IMPLANTATION MESH CLOSURE OF DEBRIDEMENT FOR NECROTIZING SOFT TISSUE INFECTION Bilateral 07/02/2020    Performed by Marlinda Mike, MD at [facility] OR    REMOVAL TISSUE EXPANDER Bilateral 07/02/2020    Performed by Marlinda Mike, MD at [facility] OR    INCISION AND DRAINAGE HEMATOMA/ SEROMA - TORSO N/A 08/05/2020    Performed by Marlinda Mike, MD at [facility] OR    REPAIR INTERMEDIATE WOUND 12.6 CM TO 20.0 CM - TRUNK - total length 24cm  08/05/2020    Performed by Marlinda Mike, MD at [facility] OR    GRAFTING AUTOLOGOUS FAT BY LIPOSUCTION TO TRUNK/ BREASTS/ SCALP/ ARMS/ LEGS - 50 CC OR LESS 60cc to the R, 90cc to the L Bilateral 08/10/2021    Performed by Marlinda Mike, MD at [facility] OR    GRAFTING AUTOLOGOUS FAT BY LIPOSUCTION TO TRUNK/ BREASTS/ SCALP/ ARMS/ LEGS - EACH ADDITIONAL 50 CC Bilateral 08/10/2021  Performed by Marlinda Mike, MD at [facility] OR    REVISION RECONSTRUCTED BREAST Left 08/10/2021    Performed by Marlinda Mike, MD at [facility] OR    REPAIR INTERMEDIATE WOUND  2.5 CM OR LESS - TORSO abdominal dogear removal R side - 9cm, L side - 10cm N/A 08/10/2021    Performed by Marlinda Mike, MD at [facility] OR    CARDIOVASCULAR STRESS TEST      ECHOCARDIOGRAM PROCEDURE      ELECTROCARDIOGRAM         Family History   Problem Relation Age of Onset    Heart Attack Mother     Cancer Mother     Diabetes Mother     Cancer-Breast Mother 49    Heart Disease Mother     Hypertension Mother     High Cholesterol Mother     Sudden Cardiac Death Mother     Heart Disease Father     Hypothyroid Father     Hypertension Father     Thyroid Disease Father     High Cholesterol Father     Hypertension Brother     Thyroid Disease Brother     Cancer-Breast Maternal Aunt 26    Cancer-Ovarian Maternal Aunt 50    Diabetes Maternal Aunt     Arthritis-rheumatoid Maternal Aunt     Diabetes Maternal Grandmother     Heart Disease Maternal Grandfather     Sudden Cardiac Death Maternal Grandfather     Thyroid Disease Brother        Allergies  Tegaderm     Medications  Current Outpatient Medications   Medication Sig Dispense Refill    CHOLEcalciferoL (vitamin D3) (OPTIMAL D3) 50,000 units capsule TAKE ONE CAPSULE BY MOUTH EVERY WEEK      ciclopirox (PENLAC) 8 % topical solution Apply topically to entire affected nail(s) once daily.  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      metFORMIN (GLUCOPHAGE) 1,000 mg tablet Take one tablet by mouth twice daily with meals.      montelukast (SINGULAIR) 10 mg tablet Take one tablet by mouth at bedtime daily.      [START ON 01/04/2022] nebivoloL (BYSTOLIC) 10 mg tablet Take Bystolic 10mg  on 12/11 @ 7PM and 12/12 @ 7AM  Indications: ccta prep 2 tablet 0    tamoxifen (NOLVADEX) 20 mg tablet Take one tablet by mouth at bedtime daily. 90 tablet 3     No current facility-administered medications for this visit.       Review of Systems  Per HPI    Objective:     Physical Exam  BP (!) 141/84 (BP Source: Arm, Left Upper, Patient Position: Sitting)  - Pulse (!) 132  - Temp 37.3 ?C (99.2 ?F) (Oral)  - Ht 182.9 cm (6')  - Wt (!) 145 kg (319 lb 9.6 oz)  - LMP  (LMP Unknown) Comment: copper IUD - BMI 43.35 kg/m?     Physical Exam  Constitutional:       Appearance: She is well-developed.   HENT:      Head: Normocephalic and atraumatic.   Eyes:      Conjunctiva/sclera: Conjunctivae normal.      Pupils: Pupils are equal, round, and reactive to light.  Abdominal:      General: There is no distension.      Palpations: Abdomen is soft.      Tenderness: There is no abdominal tenderness. There is no guarding or rebound.   Genitourinary:     Comments: Normal external genitalia.  Normal vaginal epithelium.  Cervix visualized with IUD strings seen at the os.  Bimanual exam: no CMT, uterus regular and non-tender.  No adnexal masses or fullness.           Assessment:     46 y.o. [id number] here for a well woman exam      Plan:     Well Woman Exam  Cervical cancer screening: Pap last done in 2021, [facility].  Next pap 2026.  Screening for breast cancer: per cancer center  Contraception plan: copper IUD  Screening for colon cancer if >45: colonoscopy ordered.  Counseling: Reviewed diet and exercise recommendations. Safe sex practices.  Breast self-awareness.  Vitamin D/Calcium recommendations  Flu vaccine today   Discussion of hysterectomy   We discussed that in general and oophorectomy is not recommended for premenopausal patients as this will shorten her life span.  We reviewed that sometimes patients with a history of breast cancer are recommended for an oophorectomy and this is done at the recommendation of her cancer team.  We reviewed that Zoladex and the decisions around it would be made by her cancer doctors.  She was encouraged to contact them for further questions about their recommendations regarding an oophorectomy.  At this time there is no reason to pursue hysterectomy.  RTC annually for WWE or as needed       Myrtie Hawk, MD     Future Appointments   Date Time Provider Department Center   12/31/2021  2:45 PM Myrtie Hawk, MD [facility] OB/GYN   01/05/2022 10:30 AM [facility] CVM Procedur   01/05/2022 11:00 AM CT-HOSPITAL ROOM 1 (FLASH) CAT Radiology   01/26/2022  8:30 AM Reola Mosher, MD [facility] [place] [facility]   04/26/2022  9:15 AM PHLEBOTOMIST IN LAB LEVEL 2 [facility] [place] Exam   04/26/2022 10:00 AM Sistrunk, Cordie Grice, APRN-NP [facility] [place] Exam   06/08/2022  3:40 PM Love, Teresita Madura, MD [facility] CVM Exam   07/14/2022  8:30 AM Guy Begin, PA-C [id number] [place] Exam   07/14/2022  8:30 AM BIS [facility] - BIOIMPEDENCE SPECTROSCOPY [id number] [place] Exam

## 2021-12-31 NOTE — Patient Instructions
For up to date information on the COVID-19 virus, visit the [facility] website. [email or web address]   General supportive care during cold and flu season and infection prevention reminders:    o Wash hands often with soap and water for at least 20 seconds   o Cover your mouth and nose   o Social distancing: try to maintain 6 feet between you and other people   o Stay home if sick and symptoms mild or manageable?  If you must be around people wear a mask     If you are having symptoms of a lower respiratory infection (cough, shortness of breath) and/or fever AND either traveled in last 30 days (internationally or to region of exposure) OR known exposure to patient with COVID19:     o Call your primary care provider for questions or health needs.   Tell your doctor about your recent travel and your symptoms     o In a medical emergency, call 911 or go to the nearest emergency room.  General Instructions  For prompt and efficient communication, MyChart is preferred. Please sign up.  [email or web address]           To Contact Me:     Please send a MyChart message to your doctor or call [phone] to reach your doctor's nurse team. Please only call once in a 24-hour period. Leave a voicemail for the nurse to respond.  Calls or messages received after 4pm will be returned the next business day.  To Have Medication Refilled:   Please use the MyChart Refill request or contact your pharmacy directly to request medication refills. Please allow 72 hours.  To Receive Your Test Results:  Please allow 2 business day for labs to result in MyChart.  Please allow 4 business days for other tests, including cardiac studies, blood bank, and radiology results.  It can take up to 10 days for pathology  Our Lab (Location, Hours, and Fasting Information)  Lab Location: the 1st floor of the Medical Office Building, directly to the left of the Information Desk.  Lab Hours: Monday 6:30 am-6 pm, Tuesday-Friday 7 am-6 pm, and Saturday 7 am-noon.  Fasting for Lab: For fasting labs, please:   Do not eat for at least 8-10 hours before having your labs drawn, drink plenty of water, and make sure to continue your medications as prescribed (unless otherwise specified).  Radiology is on the 2nd floor of the Medical Office Building. Please call [phone], option #1; Mammogram at [facility] location, please call [phone], option #1.  To Schedule an Appointment:   Contact our schedulers at [phone] and select #1 to make an appointment. At this time, you will be encouraged to signed up to MyChart if you not active.     Communication preferences can be managed in MyChart to ensure you receive important appointment notifications    For Urgent Questions:   For medical emergencies, call 911.  On nights, weekends or holidays, call the Hospital operator at [phone], and ask for the ?Ob/Gyn Physician on call or Certified Nurse Midwife on call.?              We value your feedback and you may receive a survey about your experience with our office. If you had a favorable experience, the only positive survey results that we will receive are if we are rated in the 9 or 10 range out of 10 points. Please let us know why we did not earn 9-10 rating.  Thank  you.

## 2022-01-01 DIAGNOSIS — Z01419 Encounter for gynecological examination (general) (routine) without abnormal findings: Secondary | ICD-10-CM

## 2022-01-04 ENCOUNTER — Encounter: Admit: 2022-01-04 | Discharge: 2022-01-04 | Payer: BC Managed Care – PPO

## 2022-01-04 MED ORDER — TAMOXIFEN 20 MG PO TAB
20 mg | ORAL_TABLET | Freq: Every evening | ORAL | 3 refills | Status: AC
Start: 2022-01-04 — End: ?

## 2022-01-05 ENCOUNTER — Ambulatory Visit: Admit: 2022-01-05 | Discharge: 2022-01-05 | Payer: BC Managed Care – PPO

## 2022-01-05 ENCOUNTER — Encounter: Admit: 2022-01-05 | Discharge: 2022-01-05 | Payer: BC Managed Care – PPO

## 2022-01-05 DIAGNOSIS — R55 Syncope and collapse: Secondary | ICD-10-CM

## 2022-01-05 DIAGNOSIS — Z923 Personal history of irradiation: Secondary | ICD-10-CM

## 2022-01-05 DIAGNOSIS — E669 Obesity, unspecified: Secondary | ICD-10-CM

## 2022-01-05 DIAGNOSIS — R0609 Other forms of dyspnea: Secondary | ICD-10-CM

## 2022-01-05 DIAGNOSIS — I10 Essential (primary) hypertension: Secondary | ICD-10-CM

## 2022-01-05 DIAGNOSIS — Z136 Encounter for screening for cardiovascular disorders: Secondary | ICD-10-CM

## 2022-01-05 DIAGNOSIS — R000 Tachycardia, unspecified: Secondary | ICD-10-CM

## 2022-01-05 DIAGNOSIS — Z9889 Other specified postprocedural states: Secondary | ICD-10-CM

## 2022-01-05 DIAGNOSIS — N650 Deformity of reconstructed breast: Secondary | ICD-10-CM

## 2022-01-05 MED ORDER — SODIUM CHLORIDE 0.9 % IV SOLP
250 mL | INTRAVENOUS | 0 refills | Status: AC | PRN
Start: 2022-01-05 — End: ?

## 2022-01-05 MED ORDER — IVABRADINE 5 MG PO TAB
15 mg | Freq: Once | ORAL | 0 refills | Status: AC | PRN
Start: 2022-01-05 — End: ?

## 2022-01-05 MED ORDER — NITROGLYCERIN 0.4 MG SL SUBL
.4 mg | SUBLINGUAL | 0 refills | Status: AC | PRN
Start: 2022-01-05 — End: ?
  Administered 2022-01-05: 18:00:00 0.4 mg via SUBLINGUAL

## 2022-01-05 MED ORDER — METOPROLOL TARTRATE 5 MG/5 ML IV SOLN
5 mg | INTRAVENOUS | 0 refills | Status: AC | PRN
Start: 2022-01-05 — End: ?

## 2022-01-05 MED ORDER — METHYLPREDNISOLONE SOD SUC(PF) 125 MG/2 ML IJ SOLR
125 mg | Freq: Once | INTRAVENOUS | 0 refills | Status: AC | PRN
Start: 2022-01-05 — End: ?

## 2022-01-05 MED ORDER — SODIUM CHLORIDE 0.9 % IJ SOLN
50 mL | Freq: Once | INTRAVENOUS | 0 refills | Status: CP
Start: 2022-01-05 — End: ?
  Administered 2022-01-05: 18:00:00 50 mL via INTRAVENOUS

## 2022-01-05 MED ORDER — DIPHENHYDRAMINE HCL 50 MG PO CAP
50 mg | Freq: Once | ORAL | 0 refills | Status: AC | PRN
Start: 2022-01-05 — End: ?

## 2022-01-05 MED ORDER — DIPHENHYDRAMINE HCL 50 MG/ML IJ SOLN
50 mg | Freq: Once | INTRAVENOUS | 0 refills | Status: AC | PRN
Start: 2022-01-05 — End: ?

## 2022-01-05 MED ORDER — SODIUM CHLORIDE 0.9 % IV SOLP
250 mL | INTRAVENOUS | 0 refills | Status: AC
Start: 2022-01-05 — End: ?

## 2022-01-05 MED ORDER — IOHEXOL 350 MG IODINE/ML IV SOLN
100 mL | Freq: Once | INTRAVENOUS | 0 refills | Status: CP
Start: 2022-01-05 — End: ?
  Administered 2022-01-05: 18:00:00 100 mL via INTRAVENOUS

## 2022-01-14 ENCOUNTER — Encounter: Admit: 2022-01-14 | Discharge: 2022-01-14 | Payer: BC Managed Care – PPO

## 2022-01-14 NOTE — Progress Notes
Radiation Oncology Follow Up Note  Date: 01/20/2022       Jennifer Durham is a 46 y.o. female.     The encounter diagnosis was Malignant neoplasm of upper-outer quadrant of right breast in female, estrogen receptor positive .  Staging:  Cancer Staging   Malignant neoplasm of upper-outer quadrant of right breast in female, estrogen receptor positive   Staging form: Breast, AJCC 8th Edition  - Clinical stage from 07/04/2019: Stage IA (cT1c, cN0(f), cM0, G2, ER+, PR+, HER2-) - Signed by Guy Begin, PA-C on 07/12/2019  - Pathologic stage from 08/21/2019: Stage IA (pT1c, pN1a, cM0, G2, ER+, PR+, HER2-) - Signed by Guy Begin, PA-C on 08/30/2019      History of Present Illness  Ms. Jennifer Durham is a 46 y.o. female with hx of stage IA ER/PR+, HER2- IDC of the right breast diagnosed in 06/2019 after detecting dimpling of her breast in 05/2019. She presented for mammogram which identified a spiculated mass in the upper right medial breast. She underwent ultrasound and biopsy that confirmed carcinoma at that time. She presented to [facility] for further evaluation. Pathology review at [facility] confirmed grade 2 IDC with a focal lobular growth pattern and lymphoid tissue with no evidence of carcinoma in the right axillary specimen. She was recommended to undergo upfront surgery. On 08/01/2019, she underwent bilateral skin sparing mastectomy and right sentinel lymph node biopsy, axilla lymph node dissection, and reconstruction.  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  A 5 mm focus of metastatic carcinoma was found in 1 of 4 sentinel lymph nodes.  0 of 27 additional axillary nodes were involved. pT1c N1a M0 (1 of 34 LN+) M0. No LVI, No EIC, No ENE. She was recommended to receive adjuvant chemotherapy with TC x4 cycles, which she completed 11/28/2019. She was recommended to receive adjuvant radiation therapy. Diagnosis:   Stage 1a ER/PR+, HER2- IDC of the right breast s/p bilateral SSM, right ALND and reconstruction    Treatment History:   Patient underwent adjuvant radiotherapy to her right reconstructed breast, chest wall and regional nodes to a dose of 4526 cGy in 16 fractions from 02/19/2020 - 03/11/2020. She tolerated treatment well and had no treatment interruptions.    Subjective:       Jennifer Durham returns today for routine follow-up. She is unaccompanied to today's visit. She is doing well overall. She notes her fatigue is improving. She denies pain or tenderness in her breast. She does have some discomfort with range of motion and feels some tightness in her axilla/lateral chest wall. She does not find that this limits her activities. She does do some stretches to help with this along with her lymphedema exercises. She has some shortness of air with exertion, which is chronic in nature and unchanged recently. She denies associated chest pain, cough or fevers. She has experienced a decrease in visual acuity over the past year but denies other neurologic symptoms, such as headaches or incoordination. She does occasionally experience some lightheadedness with rapid position changes. She has experienced tachycardia and saw cardiology for this; her HCTZ was stopped as it was thought her symptoms may be related to dehydration. She has had some improvement in symptoms since this was stopped. She has experienced itching of her left inframammary fold recently. This started around Thanksgiving and has persisted, though occurs intermittently. She has not changed any skin care products. She has some  joint pain with tamoxifen but otherwise tolerate this well. 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        Review of Systems   Constitutional:  Positive for fatigue. Negative for fever.   HENT:  Negative for sore throat and trouble swallowing.    Respiratory:  Negative for cough and shortness of breath. Musculoskeletal:  Positive for arthralgias. Negative for myalgias.        Tight ROM right arm/shoulder   Skin:  Negative for color change.        Pruritus left inframammary fold   Neurological:  Negative for light-headedness and headaches.   Hematological:  Negative for adenopathy.   Psychiatric/Behavioral:  The patient is not nervous/anxious.    All other systems reviewed and are negative.      Objective:          azithromycin (ZITHROMAX) 250 mg tablet Take  by mouth.    CHOLEcalciferoL (vitamin D3) (OPTIMAL D3) 50,000 units capsule TAKE ONE CAPSULE BY MOUTH EVERY WEEK    ciclopirox (PENLAC) 8 % topical solution Apply topically to entire affected nail(s) once daily.  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    losartan (COZAAR) 50 mg tablet Take  by mouth.    metFORMIN (GLUCOPHAGE) 1,000 mg tablet Take one tablet by mouth twice daily with meals.    miconazole nitrate (ZEASORB AF) 2 % powder Apply  topically to affected area twice daily as needed. Apply to breast folds  Indications: a skin infection due to the fungus Candida    montelukast (SINGULAIR) 10 mg tablet Take one tablet by mouth at bedtime daily.    rosuvastatin (CRESTOR) 5 mg tablet Take  by mouth.    tamoxifen (NOLVADEX) 20 mg tablet TAKE ONE TABLET BY MOUTH EVERY NIGHT AT BEDTIME     Vitals:    01/20/22 0825   BP: 126/85   BP Source: Arm, Left Upper   Pulse: 97   Temp: 36.6 ?C (97.8 ?F)   SpO2: 92%   TempSrc: Temporal   PainSc: Zero   Weight: (!) 146.7 kg (323 lb 6.4 oz)     Body mass index is 43.86 kg/m?Marland Kitchen     Pain Score: Zero        Fatigue Scale: 0-None    KARNOFSKY PERFORMANCE SCORE:  100% Normal, no complaints     Physical Exam  Vitals reviewed.   Constitutional:       General: She is not in acute distress.     Appearance: Normal appearance. She is well-developed. She is obese. She is not ill-appearing.   HENT:      Head: Normocephalic and atraumatic.   Eyes:      General: Lids are normal.      Extraocular Movements: Extraocular movements intact.      Conjunctiva/sclera: Conjunctivae normal.   Cardiovascular:      Rate and Rhythm: Normal rate and regular rhythm.      Heart sounds: Normal heart sounds.   Pulmonary:      Effort: Pulmonary effort is normal.      Breath sounds: Normal breath sounds.   Chest:      Comments: S/p bilateral mastectomy with flap reconstruction. No palpable abnormalities, mild hyperpigmentation of left inframammary fold noted.     Musculoskeletal:  Cervical back: Neck supple.   Lymphadenopathy:      Cervical: No cervical adenopathy.      Upper Body:      Right upper body: No supraclavicular or axillary adenopathy.      Left upper body: No supraclavicular or axillary adenopathy.   Skin:     General: Skin is warm and dry.      Coloration: Skin is not pale.   Neurological:      Mental Status: She is alert and oriented to person, place, and time. Mental status is at baseline.      Cranial Nerves: Cranial nerves 2-12 are intact.      Sensory: Sensation is intact.      Motor: Motor function is intact.   Psychiatric:         Attention and Perception: Attention normal.         Mood and Affect: Mood and affect normal.         Speech: Speech normal.         Behavior: Behavior normal.         Cognition and Memory: Cognition and memory normal.            Laboratory:    Comprehensive Metabolic Profile    Lab Results   Component Value Date/Time    NA 139 10/15/2021 12:00 AM    K 3.5 10/15/2021 12:00 AM    CL 103 10/15/2021 12:00 AM    CO2 23.0 10/15/2021 12:00 AM    GAP 11 10/28/2020 02:28 PM    BUN 18.0 10/15/2021 12:00 AM    CR 1.12 (H) 10/15/2021 12:00 AM    GLU 114 (H) 10/15/2021 12:00 AM    Lab Results   Component Value Date/Time    CA 8.9 10/15/2021 12:00 AM    ALBUMIN 3.6 10/15/2021 12:00 AM    TOTPROT 6.5 10/15/2021 12:00 AM    ALKPHOS 49 10/15/2021 12:00 AM    AST 20 10/15/2021 12:00 AM    ALT 14 10/15/2021 12:00 AM    TOTBILI 0.35 10/15/2021 12:00 AM    GFR 57 (L) 11/28/2019 08:10 AM    GFRAA >60 11/28/2019 08:10 AM        CBC w diff    Lab Results   Component Value Date/Time    WBC 7.99 10/15/2021 12:00 AM    RBC 4.11 10/15/2021 12:00 AM    HGB 11.8 10/15/2021 12:00 AM    HCT 35.9 10/15/2021 12:00 AM    MCV 87.3 10/15/2021 12:00 AM    MCH 28.7 10/15/2021 12:00 AM    MCHC 32.9 10/15/2021 12:00 AM    RDW 43.8 10/15/2021 12:00 AM    PLTCT 303 10/15/2021 12:00 AM    MPV 9.0 (L) 10/15/2021 12:00 AM    Lab Results   Component Value Date/Time    NEUT 69 10/28/2020 02:28 PM    ANC 4.80 10/28/2020 02:28 PM    LYMA 16 (L) 10/28/2020 02:28 PM    ALC 1.10 10/28/2020 02:28 PM    MONA 6 10/28/2020 02:28 PM    AMC 0.40 10/28/2020 02:28 PM    EOSA 8 (H) 10/28/2020 02:28 PM    AEC 0.60 (H) 10/28/2020 02:28 PM    BASA 1 10/28/2020 02:28 PM    ABC 0.10 10/28/2020 02:28 PM          Imaging:   CT CORONARY CTA W CONTRAST  Narrative: Primary reading physician: Alda Lea, DO  Secondary reading physician: Ralph Dowdy, MBBS  Exam date: 01/05/2022    INDICATIONS: 46 year old female past medical history of hypertension, right-sided breast cancer status post mastectomy and chemotherapy and a family history of bicuspid aortic valve. Due to exertional symptoms, a coronary CTA was requested for further evaluation.    PROCEDURAL DETAILS: 128 slice, dual source, computed tomography of the heart, without contrast material followed by contrast material and further sections, including cardiac gating and 3-D image postprocessing; cardiac structure and morphology, and computed tomographic angiography of the coronary arteries.    FINDINGS:    General Cardiac Morphology and Structure:  The pericardium is normal in thickness and no significant pericardial effusion is seen.     Right ventricular free wall morphology appears unremarkable. Ventricular chamber sizes appear to be within normal limits. Left ventricular wall thickness appears to be within normal limits. No ventricular septal defect. No intracardiac masses or thrombi are visualized.     The tricuspid and pulmonic valves are not seen well, but the mitral and aortic valves otherwise appear unremarkable. The valve appears trileaflet.    Atrial and Pulmonary Venous Anatomy:  The left atrium is normal. The right atrium is normal.  The left atrial appendage appears free of filling defect to suggest thrombus. The interatrial septum appears intact without evidence to suggest interatrial shunting.    There is a normal anatomic pulmonary vein anatomy with 3 left and 3 right pulmonary veins.    Aorta and Pulmonary Arteries:  The visualized portions of the pulmonary artery and its branches appear unremarkable without filling defect to suggest thrombus. The main pulmonary artery proximally measures 30 mm.     The visualized portions of the ascending aorta is mildly dilated in size and there is no evidence of dissection, coarctation or significant atheromatous plaquing. The sinus of Valsalva measures 37 mm. The proximal ascending aorta measures 41 mm. The descending thoracic aorta measures 22 mm.     Coronary Angiography:  Left main: Arises from the left coronary cusp. It is a medium size and short vessel. It bifurcates into the LAD and LCx. No radiographic significant disease.    Left anterior descending: This is a medium size vessel. It gives rise to at least 3 diagonal branches. It is a type III vessel. There is minimal luminal irregularities.    Left circumflex: This is a medium size vessel. It gives rise to at least 2 obtuse marginal branches. The second obtuse marginal is a medium size vessel which portends a large portion of the lateral and is tortuous in course. This is free of angiographically significant disease.    Right coronary artery: This emerges from the right coronary cusp. This is a right dominant vessel. It gives rise to at least 2 acute marginal branches before further bifurcates into the PDA and PLV branches. This vessel is free of angiographically significant disease.    Stenoses are reported in accordance with the CAD-RADS Coronary Artery Disease Reporting and Data System, a consensus statement of the Society of Cardiovascular Computed Tomography, American College of Radiology, and Ryder System of Cardiovascular Imaging (Journal of Cardiovascular Computed Tomography 2016):  CAD-RADS 0 - (0% maximal stenosis): No plaque or stenosis  CAD-RADS 1 - (1-24%): Minimal stenosis or plaque with no stenosis  CAD-RADS 2 - (25-49%): Mild stenosis  CAD-RADS 3 - (50-69%): Moderate stenosis  CAD-RADS 4A - (70-99%): Severe stenosis  CAD-RADS 4B - (Left main >50% or 3-vessel obstructive > 70% disease)  CAD-RADS 5 - (100%): Total coronary occlusion  CAD-RADS N- Non-diagnostic study;  obstructive CAD cannot be excluded  Modifiers: S (stent), G (graft), V (vulnerability)    Noncardiac Findings:  Noncardiac findings are reported separately under the Limited CT Chest Report of the same date.  Impression: 1.  CAD-RADS 1. No significant obstructive coronary artery disease is noted. Coronary artery anatomy as described above with normal coronary artery origins.  2.  Trileaflet aortic valve.  3.  The ascending thoracic aorta is mildly dilated measuring up to 41 mm. No evidence of dissection or coarctation.  4.  No pericardial effusion.     Finalized by Renato Shin, DO on 01/05/2022 5:07 PM. Dictated by Renato Shin, DO on 01/05/2022 4:54 PM.  CT LMTD CHEST W CARDIAC  Narrative: Limited CT chest    Clinical history: Tachycardia near syncope aortic valve    Technique: Multiple contiguous axial images were obtained to portions of the chest with contrast during cardiac CT imaging. The cardiac portions of the study are dictated separately.    Findings:    Postsurgical changes noted in the breasts. Mild radiation fibrosis in the anterior right upper lobe. Tiny sub-5 mm nodule abutting the right major fissure likely representing a benign fissural lymph node (series 8, image 12). No dedicated CT follow-up required. No significant pulmonary or extracardiac abnormalities are identified.   Impression: No significant pulmonary or extracardiac abnormalities are identified. Please see separately dictated cardiac CT report.     Finalized by Katina Dung, M.D. on 01/05/2022 2:47 PM. Dictated by Katina Dung, M.D. on 01/05/2022 2:45 PM.       Path:  PATHOLOGY REPORT   Date Value Ref Range Status   08/10/2021   Final    [facility]  [email or web address]    Department of Pathology and Laboratory Medicine  [address] [place], [place] [address]  Surgical Pathology Office:  [phone]  Fax:  [phone]  SURGICAL PATHOLOGY REPORT    NAME: KRISTIANE, BHANDARI PATH #: [id number] MR #: [id number] SPECIMEN  CLASS: SI BILLING #: [id number] ALT ID #:  LOCATION: [id number] DATE OF  PROCEDURE: 08/10/2021 AGE:  45 SEX: F DATE RECEIVED: 08/10/2021 DOB:  April 10, 1975  TIME RECEIVED:  14:32 PHYSICIAN: ERIC C LAI, MD DATE OF REPORT:  08/12/2021 COPY TO:  DATE OF PRINTING: 08/12/2021         ########################################################################  Final Diagnosis:    A. Fibroadipose tissue, right abdominal tissue, excision:    Fibrosis, fat necrosis, and calcifications.  Negative for malignancy.       Attestation:  By this signature, I attest that I have personally formulated the final  interpretation expressed in this report and that the above diagnosis is  based upon my examination of the slides and/or other material indicated in  this report.    +++Electronically Signed Out By Murvin Natal, MD on 08/12/2021+++             cw/08/11/2021             ########################################################################  Material Received:  A: right abdominal tissue    History:  The patient has a history of malignant neoplasm of upper-outer quadrant of  right breast and female, estrogen receptor positive.        Gross Description:  A. Received in formalin labeled with the patient's name and right  abdominal tissue is a single unoriented fragment of tan-yellow, lobulated  fibroadipose tissue measuring 3.6 x 3.2 x 0.8 cm with a firm nodule  measuring 1.6 x 1.4 x 0.6 cm. The  outer surface of the specimen is inked  black, and the specimen is serially sectioned to reveal tan-white to  tan-yellow, lobulated tissue with a tan-yellow, firm nodule. The nodule is  submitted entirely in cassette A1 remainder of the specimen is submitted  in cassettes A2-A4. (law/brm)    lw/08/10/2021              ]     Assessment and Plan:   Jennifer Durham is a 46 y.o. 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  pT1c pN1a.  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  She is s/p bilateral skin sparing mastectomy with temporary expander placement, right sentinel lymph node biopsy, axillary lymph node dissection and 1 of 34 lymph nodes positive for a 5 mm metastatic deposit, no LVI, no ENE, no EIC.  She underwent adjuvant chemotherapy with TC, completed 4 cycles in 11/2019. She enrolled in MA.39 (BJY#[id number]) [employer] [facility], and was randomized to the treatment arm. She underwent adjuvant radiotherapy to her right reconstructed breast, chest wall and regional nodes to a dose of 4526 cGy in 16 fractions from 02/19/2020 - 03/11/2020. She is now s/p DIEP flap reconstruction of the bilateral breasts in 06/2020.     - Jennifer Durham was seen today for routine follow-up after completion of radiation therapy.  At this time, she is clinically stable with no clinical evidence of disease recurrence or progression.  She has some mild hyperpigmentation (NCI CTCAE grade 1) of the left inframammary fold with some intermittent pruritus (NCI CTCAE grade 1) but otherwise no apparent subacute toxicities of radiation therapy. ECOG Performance status 0.   - She is experiencing NCI CTCAE grade 1 events including sinus tachycardia, dizziness and dyspnea, though these are likely related other comorbidities/medications.    - Itching of the left inframammary fold may be related to cutaneous candidiasis; will trial topical miconazole powder.   - Our plan will be for the patient to return to our clinic in 6 months for continued follow-up.  She may contact us in the interim if there are any questions or concerns requiring earlier assessment.  -The patient will keep their appointments with their other managing providers including medical oncology, Dr. Welton Flakes, she continues on tamoxifen, as well as with breast surgery with next f/u in June.         Total time for today's visit was 40 minutes. Visit time spent on the following: preparing to see the patient, obtaining and/or reviewing separately obtained history/information, performing a physical examination and/or evaluation, counseling and educating the patient/family/caregiver, referring to and communication with other health care professionals, documenting clinical information in the electronic or other health record and care coordination.     Liston Alba AGNP-C  [facility] - Radiation Oncology  [address]  [place], [place] [address]    Collaborating Physician: Elby Showers, MD    Parts of this note were created using voice recognition software.  Please excuse any grammatical or typographical errors.

## 2022-01-14 NOTE — Telephone Encounter
-----   Message from Chriss Driver, RN sent at 01/14/2022  8:12 AM CST -----    ----- Message -----  From: Benna Dunks, MD  Sent: 01/14/2022   6:56 AM CST  To: [facility] Nurse Gen Card Team [employer] guys,    You mind reaching out to [facility] and letting her know that her coronary artery CTA did not demonstrate any significant coronary artery disease.  I think her abnormal stress test was likely a false positive.  There was mention of her aorta being slightly dilated.  I do not think this is anything that she needs to worry about at this time.  We can discuss follow-up for this issue in May when I see her back in clinic.    Thanks!

## 2022-01-14 NOTE — Telephone Encounter
From: Benna Dunks, MD   Sent: 01/14/2022   7:00 AM CST   To: [facility] Nurse Gen Card Team [facility]     Cardiac monitor looks great.  No significant arrhythmia.     Thanks!

## 2022-01-19 ENCOUNTER — Encounter: Admit: 2022-01-19 | Discharge: 2022-01-19 | Payer: BC Managed Care – PPO

## 2022-01-19 MED ORDER — ESCITALOPRAM OXALATE 10 MG PO TAB
ORAL_TABLET | 1 refills | Status: AC
Start: 2022-01-19 — End: ?

## 2022-01-20 ENCOUNTER — Ambulatory Visit: Admit: 2022-01-20 | Discharge: 2022-01-20 | Payer: BC Managed Care – PPO

## 2022-01-20 ENCOUNTER — Encounter: Admit: 2022-01-20 | Discharge: 2022-01-20 | Payer: BC Managed Care – PPO

## 2022-01-20 DIAGNOSIS — I10 Essential (primary) hypertension: Secondary | ICD-10-CM

## 2022-01-20 DIAGNOSIS — E039 Hypothyroidism, unspecified: Secondary | ICD-10-CM

## 2022-01-20 DIAGNOSIS — F32A Depression: Secondary | ICD-10-CM

## 2022-01-20 DIAGNOSIS — E282 Polycystic ovarian syndrome: Secondary | ICD-10-CM

## 2022-01-20 DIAGNOSIS — E785 Hyperlipidemia, unspecified: Secondary | ICD-10-CM

## 2022-01-20 DIAGNOSIS — C50411 Malignant neoplasm of upper-outer quadrant of right female breast: Secondary | ICD-10-CM

## 2022-01-20 DIAGNOSIS — T7840XA Allergy, unspecified, initial encounter: Secondary | ICD-10-CM

## 2022-01-20 DIAGNOSIS — J302 Other seasonal allergic rhinitis: Secondary | ICD-10-CM

## 2022-01-20 DIAGNOSIS — E88819 Insulin resistance: Secondary | ICD-10-CM

## 2022-01-20 DIAGNOSIS — E669 Obesity, unspecified: Secondary | ICD-10-CM

## 2022-01-20 DIAGNOSIS — F419 Anxiety disorder, unspecified: Secondary | ICD-10-CM

## 2022-01-20 DIAGNOSIS — Z923 Personal history of irradiation: Secondary | ICD-10-CM

## 2022-01-20 DIAGNOSIS — R000 Tachycardia, unspecified: Secondary | ICD-10-CM

## 2022-01-20 DIAGNOSIS — Z789 Other specified health status: Secondary | ICD-10-CM

## 2022-01-20 DIAGNOSIS — C50919 Malignant neoplasm of unspecified site of unspecified female breast: Secondary | ICD-10-CM

## 2022-01-20 DIAGNOSIS — R12 Heartburn: Secondary | ICD-10-CM

## 2022-01-20 MED ORDER — MICONAZOLE NITRATE 2 % TP POWD
Freq: Two times a day (BID) | TOPICAL | 0 refills | Status: AC | PRN
Start: 2022-01-20 — End: ?

## 2022-01-27 ENCOUNTER — Encounter: Admit: 2022-01-27 | Discharge: 2022-01-27 | Payer: BC Managed Care – PPO

## 2022-02-11 ENCOUNTER — Ambulatory Visit: Admit: 2022-02-11 | Discharge: 2022-02-11 | Payer: BC Managed Care – PPO

## 2022-02-11 DIAGNOSIS — Z01419 Encounter for gynecological examination (general) (routine) without abnormal findings: Secondary | ICD-10-CM

## 2022-03-09 ENCOUNTER — Encounter: Admit: 2022-03-09 | Discharge: 2022-03-09 | Payer: BC Managed Care – PPO

## 2022-03-09 MED ORDER — ESCITALOPRAM OXALATE 10 MG PO TAB
ORAL_TABLET | 1 refills | Status: AC
Start: 2022-03-09 — End: ?

## 2022-04-13 ENCOUNTER — Encounter: Admit: 2022-04-13 | Discharge: 2022-04-13 | Payer: BC Managed Care – PPO

## 2022-04-13 MED ORDER — ESCITALOPRAM OXALATE 10 MG PO TAB
ORAL_TABLET | 3 refills | Status: AC
Start: 2022-04-13 — End: ?

## 2022-04-26 IMAGING — MG MAMMOGRAM 3D DX, BILATERAL
6 series · 8 of 8 positions shown · non-contrast
Comparison: none

[R tomo (1 of 2)]
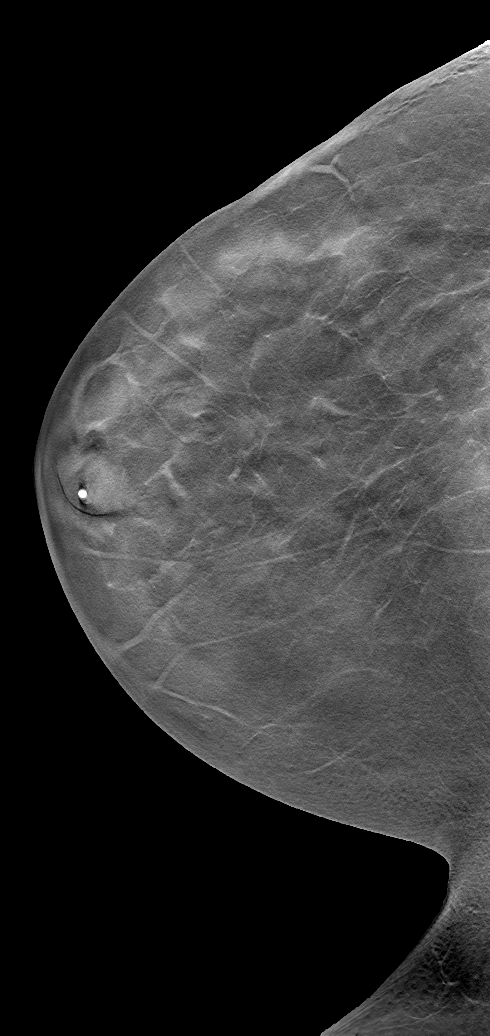

[L tomo (1 of 2)]
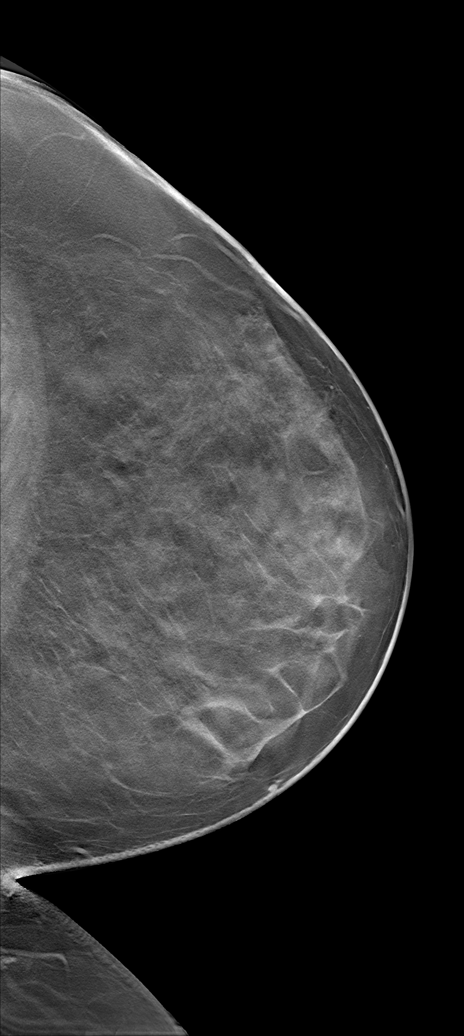

[L (1 of 2)]
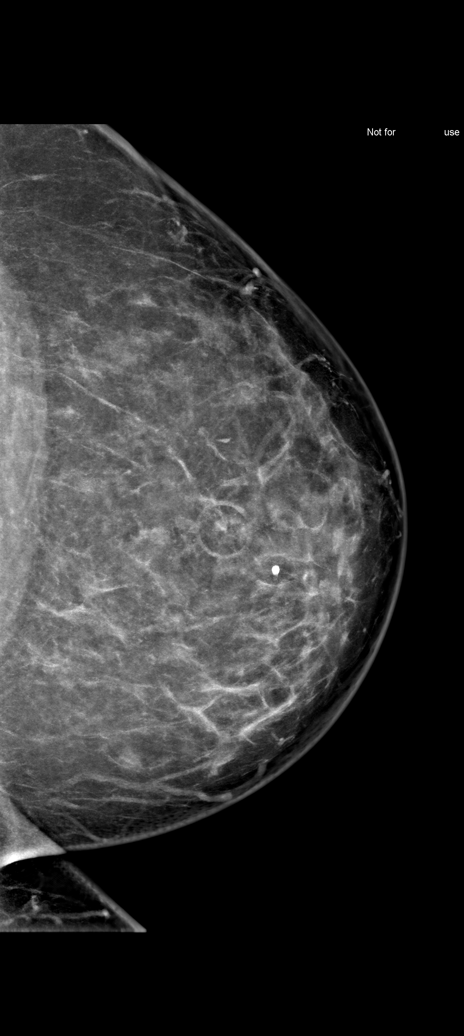

[R tomo (2 of 2)]
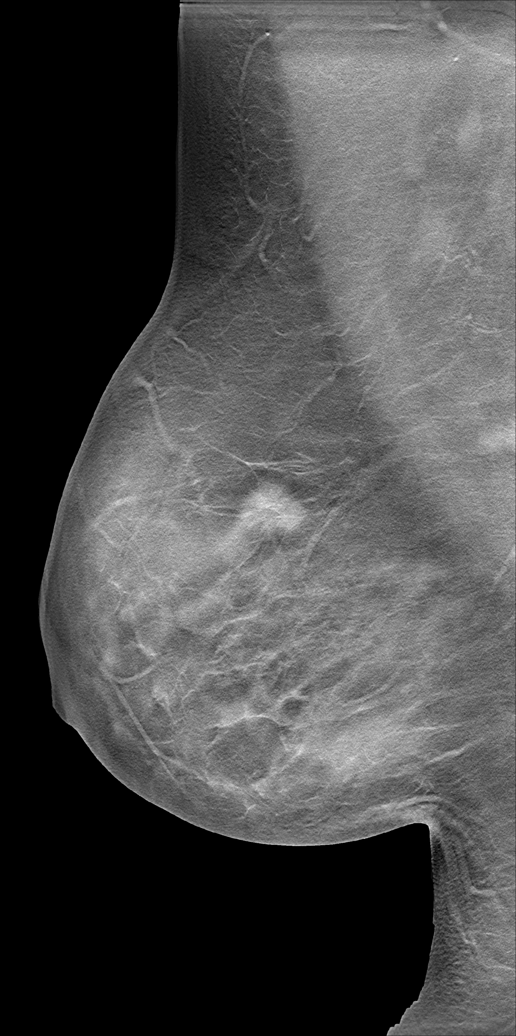

[Series 19: L tomo · oblique · left · 1.0mm · 0.09mm/px · 3 of 12 slices shown (2 of 2)]
[im 1/12]
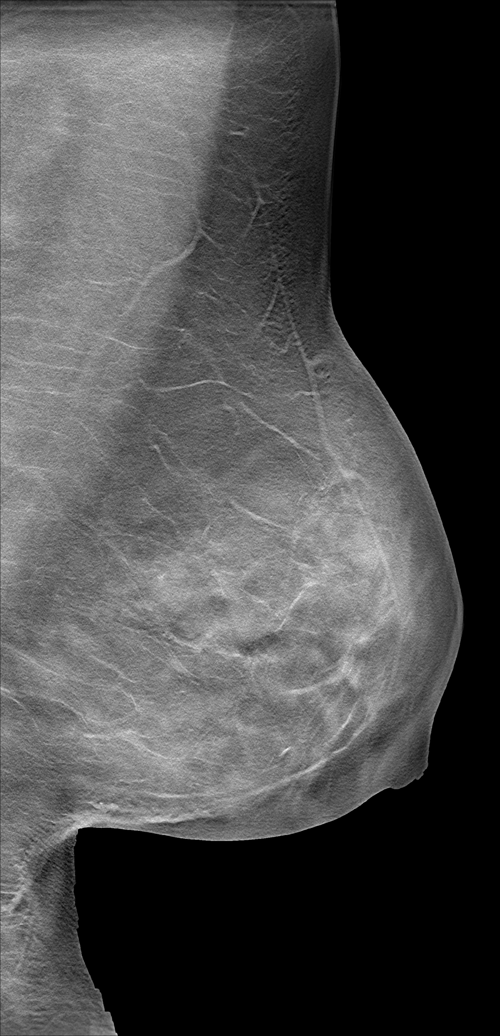
[im 6/12]
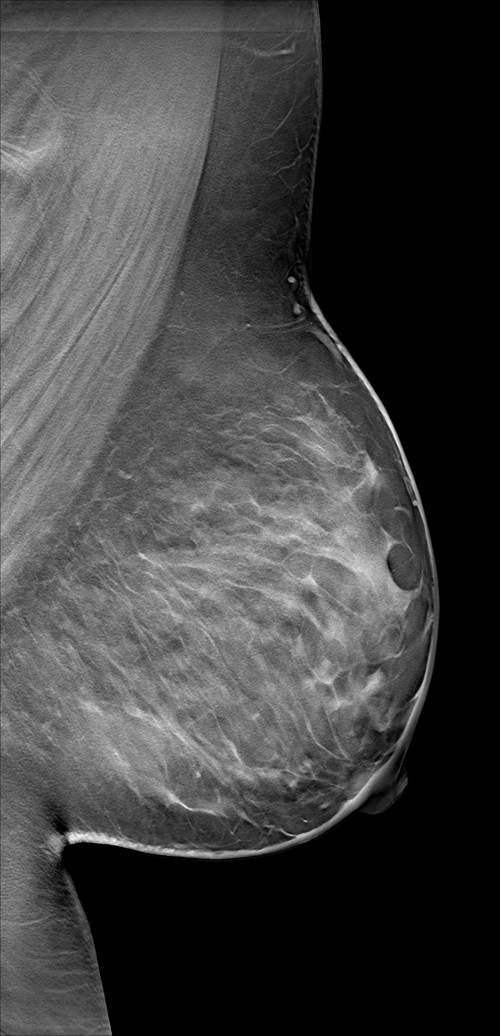
[im 12/12]
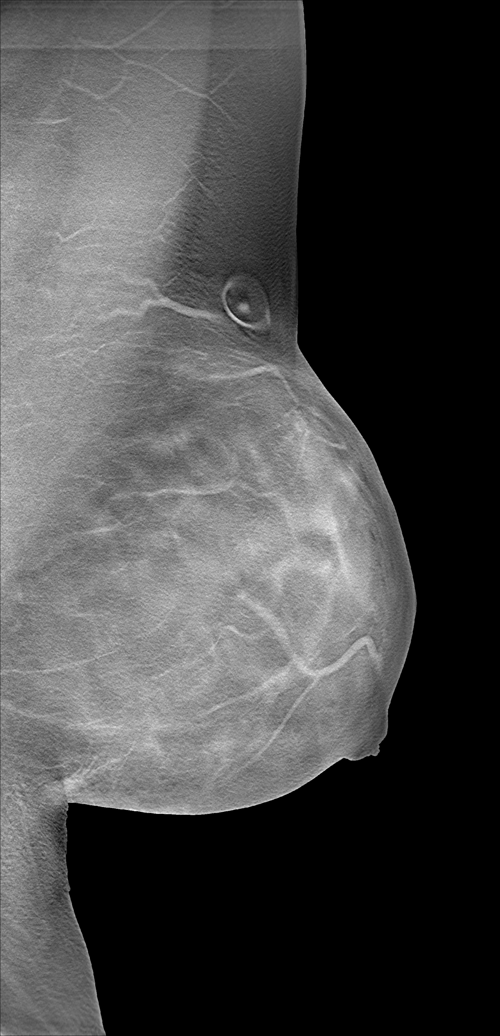

[L (2 of 2)]
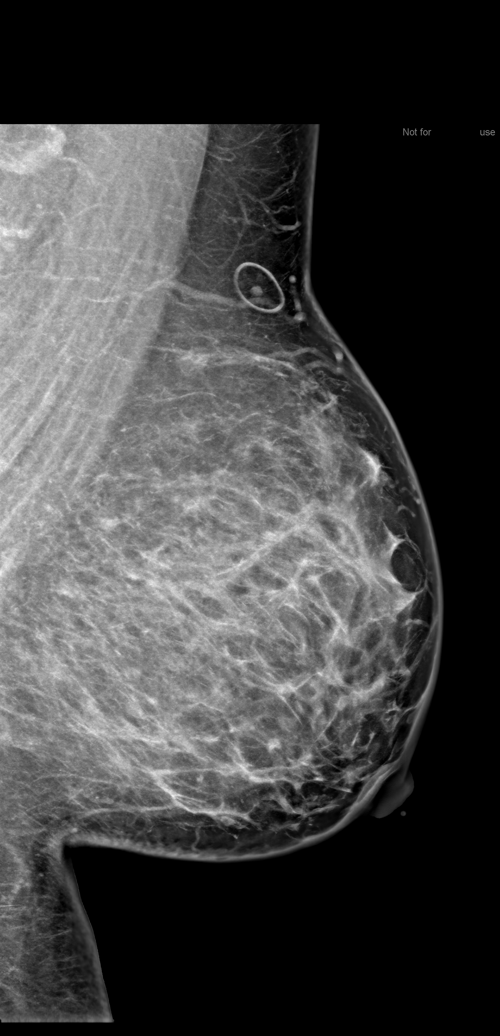

[8 of 8 positions shown; findings below may reference images not displayed]

EXAM

MAMMOGRAM

INDICATION

ATH ABNORMAL MAMMOGRAM
PT REPORTS DIMPLING AT [DATE] X 1 MO.  MOTHER DX @ EARLY 50s., MAT AUNT IN EARLY 50s.  DX BIL.  AB

TECHNIQUE

2D and tomosynthesis digital craniocaudal and mediolateral oblique views were obtained of both
breasts. Computer aided detection software was utilized.

COMPARISONS

None available at the time of dictation.

FINDINGS

Heterogenous breast tissue density, which may obscure small masses.

[Spiculated mass with surrounding architectural distortion measuring up to 2 cm in the right upper
medial breast, 10-11 o'clock, 7.7 cm posterior to the right nipple. Slightly prominent asymmetric
right axillary lymph nodes.]

No suspicious microcalcification.

IMPRESSION
1. BI-RADS 0, INCOMPLETE: Need Additional Imaging Evaluation. Spiculated mass in the right upper
outer breast.  Possible asymmetric right axillary lymph nodes.
2. Further evaluation with ultrasound is recommend.
3. A reminder letter will be scheduled at the appropriate time pending additional views.

Tech Notes:

## 2022-05-14 ENCOUNTER — Encounter: Admit: 2022-05-14 | Discharge: 2022-05-14 | Payer: BC Managed Care – PPO

## 2022-05-14 NOTE — Progress Notes
Spoke with Jennifer Durham- she states she did nt complete her January zoladex because no one ever called her to schedule it ad she did ot now if she was supposed to be doing it.  Cycle 2 to be schedule for first availble

## 2022-05-14 NOTE — Progress Notes
Left VM for patient to inquire if she is still interested in continuing her zoladex injections - call back number provided

## 2022-05-26 ENCOUNTER — Encounter: Admit: 2022-05-26 | Discharge: 2022-05-26 | Payer: BC Managed Care – PPO

## 2022-05-26 DIAGNOSIS — C50411 Malignant neoplasm of upper-outer quadrant of right female breast: Secondary | ICD-10-CM

## 2022-05-26 MED ORDER — LIDOCAINE (PF) 10 MG/ML (1 %) IJ SOLN
2 mL | Freq: Once | SUBCUTANEOUS | 0 refills | Status: CP
Start: 2022-05-26 — End: ?
  Administered 2022-05-26: 22:00:00 2 mL via SUBCUTANEOUS

## 2022-05-26 MED ORDER — GOSERELIN 10.8 MG SC IMPL
10.8 mg | Freq: Once | SUBCUTANEOUS | 0 refills | Status: CP
Start: 2022-05-26 — End: ?
  Administered 2022-05-26: 22:00:00 10.8 mg via SUBCUTANEOUS

## 2022-05-26 NOTE — Progress Notes
Patient arrived to CC treatment for injection. Zoladex given to RLQ and tolerated without difficulty.  No pertinent changes since last assessment. All questions and concerns addressed. Pt. left CC treatment in stable condition.

## 2022-06-03 ENCOUNTER — Encounter: Admit: 2022-06-03 | Discharge: 2022-06-03 | Payer: BC Managed Care – PPO

## 2022-06-08 ENCOUNTER — Encounter: Admit: 2022-06-08 | Discharge: 2022-06-08 | Payer: BC Managed Care – PPO

## 2022-06-10 ENCOUNTER — Encounter: Admit: 2022-06-10 | Discharge: 2022-06-10 | Payer: BC Managed Care – PPO

## 2022-06-10 DIAGNOSIS — R12 Heartburn: Secondary | ICD-10-CM

## 2022-06-10 DIAGNOSIS — R000 Tachycardia, unspecified: Secondary | ICD-10-CM

## 2022-06-10 DIAGNOSIS — Z789 Other specified health status: Secondary | ICD-10-CM

## 2022-06-10 DIAGNOSIS — E88819 Insulin resistance: Secondary | ICD-10-CM

## 2022-06-10 DIAGNOSIS — E282 Polycystic ovarian syndrome: Secondary | ICD-10-CM

## 2022-06-10 DIAGNOSIS — E669 Obesity, unspecified: Secondary | ICD-10-CM

## 2022-06-10 DIAGNOSIS — F32A Depression: Secondary | ICD-10-CM

## 2022-06-10 DIAGNOSIS — J302 Other seasonal allergic rhinitis: Secondary | ICD-10-CM

## 2022-06-10 DIAGNOSIS — Z923 Personal history of irradiation: Secondary | ICD-10-CM

## 2022-06-10 DIAGNOSIS — F419 Anxiety disorder, unspecified: Secondary | ICD-10-CM

## 2022-06-10 DIAGNOSIS — I10 Essential (primary) hypertension: Secondary | ICD-10-CM

## 2022-06-10 DIAGNOSIS — T7840XA Allergy, unspecified, initial encounter: Secondary | ICD-10-CM

## 2022-06-10 DIAGNOSIS — E039 Hypothyroidism, unspecified: Secondary | ICD-10-CM

## 2022-06-10 DIAGNOSIS — C50919 Malignant neoplasm of unspecified site of unspecified female breast: Secondary | ICD-10-CM

## 2022-06-10 DIAGNOSIS — E785 Hyperlipidemia, unspecified: Secondary | ICD-10-CM

## 2022-06-14 ENCOUNTER — Encounter: Admit: 2022-06-14 | Discharge: 2022-06-14 | Payer: BC Managed Care – PPO

## 2022-06-18 ENCOUNTER — Encounter: Admit: 2022-06-18 | Discharge: 2022-06-18 | Payer: BC Managed Care – PPO

## 2022-06-18 DIAGNOSIS — Z08 Encounter for follow-up examination after completed treatment for malignant neoplasm: Secondary | ICD-10-CM

## 2022-06-18 DIAGNOSIS — R000 Tachycardia, unspecified: Secondary | ICD-10-CM

## 2022-06-18 DIAGNOSIS — F419 Anxiety disorder, unspecified: Secondary | ICD-10-CM

## 2022-06-18 DIAGNOSIS — E785 Hyperlipidemia, unspecified: Secondary | ICD-10-CM

## 2022-06-18 DIAGNOSIS — Z79810 Long term (current) use of selective estrogen receptor modulators (SERMs): Secondary | ICD-10-CM

## 2022-06-18 DIAGNOSIS — F3289 Other specified depressive episodes: Secondary | ICD-10-CM

## 2022-06-18 DIAGNOSIS — R12 Heartburn: Secondary | ICD-10-CM

## 2022-06-18 DIAGNOSIS — Z9013 Acquired absence of bilateral breasts and nipples: Secondary | ICD-10-CM

## 2022-06-18 DIAGNOSIS — E669 Obesity, unspecified: Secondary | ICD-10-CM

## 2022-06-18 DIAGNOSIS — R5383 Other fatigue: Secondary | ICD-10-CM

## 2022-06-18 DIAGNOSIS — J302 Other seasonal allergic rhinitis: Secondary | ICD-10-CM

## 2022-06-18 DIAGNOSIS — Z789 Other specified health status: Secondary | ICD-10-CM

## 2022-06-18 DIAGNOSIS — E88819 Insulin resistance: Secondary | ICD-10-CM

## 2022-06-18 DIAGNOSIS — C50411 Malignant neoplasm of upper-outer quadrant of right female breast: Secondary | ICD-10-CM

## 2022-06-18 DIAGNOSIS — I10 Essential (primary) hypertension: Secondary | ICD-10-CM

## 2022-06-18 DIAGNOSIS — F32A Depression: Secondary | ICD-10-CM

## 2022-06-18 DIAGNOSIS — E282 Polycystic ovarian syndrome: Secondary | ICD-10-CM

## 2022-06-18 DIAGNOSIS — M2560 Stiffness of unspecified joint, not elsewhere classified: Secondary | ICD-10-CM

## 2022-06-18 DIAGNOSIS — E039 Hypothyroidism, unspecified: Secondary | ICD-10-CM

## 2022-06-18 DIAGNOSIS — Z923 Personal history of irradiation: Secondary | ICD-10-CM

## 2022-06-18 DIAGNOSIS — C50919 Malignant neoplasm of unspecified site of unspecified female breast: Secondary | ICD-10-CM

## 2022-06-18 DIAGNOSIS — T7840XA Allergy, unspecified, initial encounter: Secondary | ICD-10-CM

## 2022-06-18 MED ORDER — LIDOCAINE (PF) 10 MG/ML (1 %) IJ SOLN
2 mL | Freq: Once | SUBCUTANEOUS | 0 refills
Start: 2022-06-18 — End: ?

## 2022-06-18 MED ORDER — GOSERELIN 10.8 MG SC IMPL
10.8 mg | Freq: Once | SUBCUTANEOUS | 0 refills
Start: 2022-06-18 — End: ?

## 2022-06-18 NOTE — Patient Instructions
Joint Stiffness; Recommend over the counter Glucosamine Chondroitin + Extra Strength Omega 3. Recommend increasing activity as tolerated.

## 2022-06-18 NOTE — Progress Notes
Name: Jennifer Durham          MRN: [id number]      DOB: Sep 07, 1975      AGE: 47 y.o.   DATE OF SERVICE: 06/18/2022    Subjective:             Reason for Visit:  No chief complaint on file.      Jennifer Durham is a 47 y.o. female.      Cancer Staging   Malignant neoplasm of upper-outer quadrant of right breast in female, estrogen receptor positive (HCC)  Staging form: Breast, AJCC 8th Edition  - Clinical stage from 07/04/2019: Stage IA (cT1c, cN0(f), cM0, G2, ER+, PR+, HER2-) - Signed by Guy Begin, PA-C on 07/12/2019  - Pathologic stage from 08/21/2019: Stage IA (pT1c, pN1a, cM0, G2, ER+, PR+, HER2-) - Signed by Guy Begin, PA-C on 08/30/2019      History of Present Illness    DIAGNOSIS: right breast cancer   PAST ONCOLOGY HISTORY: Jennifer Durham is a 47 year old pre menopausal female who noted right breast dimpling in May 2021. She was sent for diagnostic imaging at an outside facility on 07/04/19. At that time a new right breast mass was seen as well as a lymph node with cortical thickening and biopsy was recommended. Right breast biopsy 07/04/2019 [name]) showed invasive carcinoma with mixed ductal and lobular features, grade II, ER 91-100%, PR 91-100%, HER2 1+ IHC. Right axilla biopsy showed fragments of polymorphous lymphoid tissue and no malignancy seen. [name] underwent right modified radical mastectomy on 08/21/2019.  Tumor size was 1.8 cm, invasive ductal carcinoma.  Grade 2.  1 out of 34 lymph nodes was positive.  The size of LN metastasis was 5 mm.  ER was 96%, PR 99%,, HER-2 0+ by IHC.  Oncotype DX score 11. We recommended adjuvant adjuvant TC every 3 weeks x4 started 09/26/2019. She completed 4 cycles 11/28/2019.      BREAST IMAGING:  Mammogram:    -- Bilateral diagnostic mammogram 07/04/19 [name]) revealed heterogeneously dense breast tissue. Spiculated mass with surrounding architectural distortion measuring up to 2 cm in the upper right medial breast at 10-11:00, 7.7 cm posterior to the nipple. Slightly prominent asymmetric right axillary lymph nodes.  -- Right diagnostic mammogram 07/16/19 ([place]) revealed an the outside right MLO view from 07/04/2019, there was a small mass in the probable 9:00 position of the right breast at posterior depth, 2 cm inferior and 2 cm posterior to the known malignancy. Additional mammographic images will be performed to determine location. 2-D and 3-D images of the right breast were obtained.   In the 9:30 to 10:00 position of the right breast at middle to posterior depth, there was a 1.7 x 1.9 cm spiculated mass with internal tissue marker clip corresponding to biopsy-proven   malignancy. The biopsy-proven malignancy is approximately 8 cm posterior to the right nipple. In the 8:30 to 9:00 position of the right breast at posterior depth, there was an 0.8 cm lobular equal density mass which was not definitively seen on 2-D mammogram images from 03/21/2013.      Ultrasound:    -- Right breast ultrasound 07/04/19 [facility]) revealed hypoechoic spiculated mass with surrounding architectural distortion with in the right upper breast at 10:00, 6 cm FTN. Significant posterior acoustic shadowing. Overall extent 1.3 x 1.7 x 1.6 cm. Increased cortical mantle thickening of a right axillary lymph node measuring up to 4 mm. BI-RADS 5.   -- Targeted right breast  ultrasound 07/16/19 ([place]) revealed at 9:30, 9 cm from the nipple, demonstrated a 1.4 x 1.8 x 1.6 cm irregular hypoechoic mass with internal tissue marker clip corresponding to the biopsy-proven malignancy. In the right breast at 9:00, 12 cm from the nipple, there ws an 0.8 cm morphologically normal small  intramammary lymph node corresponding to the additional mammographic mass identified. No suspicious right axillary lymph nodes were seen. 3 morphologically normal right axillary lymph nodes were identified. The lymph node labeled #3, likely contains the internal tissue marker clip from outside biopsy.     Jennifer Durham underwent right modified radical mastectomy on 08/21/2019.  Tumor size was 1.8 cm, invasive ductal carcinoma.  Grade 2.  1 out of 34 lymph nodes was positive.  The size of LN metastasis was 5 mm.  ER was 96%, PR 99%,, HER-2 0+ by IHC. Oncotype Dx RS 11.   She completed 4 cycles of adjuvant TC 11/28/2019.    She completed radiation with Dr Isabella Stalling. She is enrolled on the MA39 trail.     She completed radiation with Dr Isabella Stalling 03/11/2020.            OB/GYN HISTORY: Age at onset of menstruation 13. G2P2. She had her first child at age 66. LMP 03/2016 due to Mirena placement. Uterus and ovaries intact.     PRESENT THERAPY: Tamoxifen 20mg  daily started 12/2019 + Zoladex every 3 months    Jennifer Shupert is here today for continued follow up. She denies breast concerns today. She has been following with Cardiology and last had a pelvic exam 02/2022 which was normal. She continues on Zoladex every 3 months, due 07/2022. She is currently on Lexapro 10 mg which is helpful for mood stabilization. Her hot flashes are stable and tolerable at this time. She has been experiencing increased joint pain, which she attributes to the tamoxifen. Her activity level has decreased slightly due to fatigue from [facility], which she is also taking.         Review of Systems   Constitutional:  Negative for activity change and fatigue.        + hot flashes    HENT: Negative.     Eyes: Negative.    Respiratory:  Negative for cough and shortness of breath.    Cardiovascular:  Negative for chest pain, palpitations and leg swelling.   Gastrointestinal:  Negative for abdominal pain, constipation, diarrhea, nausea and vomiting.   Endocrine: Negative.    Genitourinary: Negative.         Pre-menopausal   LMP 03/2016  Copper IUD in place   Uterus and ovaries intact    Musculoskeletal:  Negative for arthralgias and myalgias.   Skin: Negative.    Allergic/Immunologic: Negative.    Neurological:  Negative for dizziness, weakness, light-headedness and headaches. Hematological: Negative.    Psychiatric/Behavioral:  The patient is nervous/anxious.        Allergies   Allergen Reactions    Tegaderm RASH       Objective:          CHOLEcalciferoL (vitamin D3) (OPTIMAL D3) 50,000 units capsule TAKE ONE CAPSULE BY MOUTH EVERY WEEK    ciclopirox (PENLAC) 8 % topical solution Apply topically to entire affected nail(s) once daily.  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    losartan (  COZAAR) 50 mg tablet Take  by mouth.    metFORMIN (GLUCOPHAGE) 1,000 mg tablet Take one tablet by mouth daily with breakfast.    montelukast (SINGULAIR) 10 mg tablet Take one tablet by mouth at bedtime daily.    MOUNJARO 5 mg/0.5 mL injector EN     peg-electrolyte solution (NULYTELY LEMON-LIME) 420 gram oral solution OK to substitute for any generic golytely or Gavilyte-C, or Gavilyte-G, or Gavilyte-N.Mix as directed on package. Refrigerate once mixed. Do not mix greater than 24 hours prior to procedure. Drink 3/4 of bottle between 5pm and 7pm the night before procedure. Drink remaining 1/4 of bottle 5 hours prior to procedure.    rosuvastatin (CRESTOR) 5 mg tablet Take  by mouth.    tamoxifen (NOLVADEX) 20 mg tablet TAKE ONE TABLET BY MOUTH EVERY NIGHT AT BEDTIME     Vitals:    06/18/22 1443 06/18/22 1446   BP: (!) 158/110 117/88   BP Source: Arm, Left Lower Arm, Left Upper   Pulse: 113    Temp: 36.5 ?C (97.7 ?F)    Resp: 18    SpO2: 97%    TempSrc: Temporal    PainSc: Five    Weight: (!) 144.3 kg (318 lb 3.2 oz)      Body mass index is 43.16 kg/m?Marland Kitchen     Pain Score: Five  Pain Loc:  (Joints)    Fatigue Scale: 6    Pain Addressed:  N/A    Patient Evaluated for a Clinical Trial: No treatment clinical trial available for this patient.     [place] Cooperative Oncology Group performance status is 0, Fully active, able to carry on all pre-disease performance without restriction.Marland Kitchen     Physical Exam  Vitals reviewed.   Constitutional:       General: She is not in acute distress.  Eyes:      General:         Right eye: No discharge.         Left eye: No discharge.      Extraocular Movements: Extraocular movements intact.      Conjunctiva/sclera: Conjunctivae normal.      Pupils: Pupils are equal, round, and reactive to light.   Cardiovascular:      Rate and Rhythm: Regular rhythm. Tachycardia present.      Pulses: Normal pulses.      Heart sounds: No murmur heard.  Pulmonary:      Effort: Pulmonary effort is normal. No respiratory distress.   Chest:   Breasts:     Right: No mass, skin change or tenderness.      Left: No mass, skin change or tenderness.          Comments: No palpable abnormalities on examination today.  Musculoskeletal:         General: Normal range of motion.      Cervical back: Normal range of motion.      Right lower leg: No edema.      Left lower leg: No edema.   Lymphadenopathy:      Cervical: No cervical adenopathy.      Upper Body:      Right upper body: No supraclavicular or axillary adenopathy.      Left upper body: No supraclavicular or axillary adenopathy.   Skin:     General: Skin is warm and dry.   Neurological:      General: No focal deficit present.      Mental Status: She is alert and oriented  to person, place, and time.   Psychiatric:         Mood and Affect: Mood normal.         Behavior: Behavior normal.         Thought Content: Thought content normal.         Judgment: Judgment normal.       LABS:  Estradiol 04/07/2020: <15.0  Estradiol 10/28/2020: <15.0  Estradiol 05/01/2021: <15.0  Estradiol 11/06/2021: 80.9          Assessment and Plan:    1. 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   2.  Brelynn underwent right modified radical mastectomy on 08/21/2019.  Tumor size was 1.8 cm, invasive ductal carcinoma.  Grade 2.  1 out of 34 lymph nodes was positive.  The size of LN metastasis was 5 mm.  ER was 96%, PR 99%,, HER-2 0+ by IHC. Oncotype Dx RS 11.   3.  Since Suhaani is premenopausal and has node positive disease, we recommend adjuvant chemotherapy.  After discussing the options of AC-T chemotherapy versus TC chemotherapy, we decided to proceed with TC for four cycles.  We discussed the data based on ABC trials and breast cancer.  Itati has significant history of heart disease in the family and is very concerned about Adriamycin causing heart damage.  Therefore TC seemed like better choice with equivalent efficacy based on trials.  She completed 4 cycles of TC 11/28/2019.    4. She completed radiation with Dr Isabella Stalling. She is enrolled on the MA39 trail.   5. Due to her cancer being ER+; we recommended endocrine therapy for 5-10 years. She started Tamoxifen 20mg  daily 12/2019. She was told the side effects including hot flashes, uterine cancer, blood clots and cataracts. Last pelvic exam 02/2022 normal. Continue Zoladex every 3 months, due 07/2022.  6. Hot flashes and mood changes; continue Lexapro 10mg .  7. Tachycardia; continue to follow with Cardiology for evaluation and management.   8. RTC in 6 months for a breast cancer follow-up visit.     My collaborating MD Dr Raul Del.         Breast Cancer: Hormone positive, right breast cancer diagnosed on 07-12-19. Underwent bilateral mastectomy, adjuvant TC times 4, and adjuvant radiotherapy. Currently on Tamoxifen and Zoladex. Reports increased joint pain likely secondary to Tamoxifen. NED.   -Continue Tamoxifen daily and Zoladex every 3 months (next dose due 08-18-22).  -Last pelvic exam 02/2022 normal; continue annual pelvic exams while on Tamoxifen.   -Encourage regular physical activity to help alleviate joint pain.  -S/p bilateral mastectomy; no further breast surveillance imaging needed.     Follow-up in six months.    Floydene Flock, APRN-NP          Total Time Today was 30 minutes in the following activities: Preparing to see the patient, Obtaining and/or reviewing separately obtained history, Performing a medically appropriate examination and/or evaluation, Counseling and educating the patient/family/caregiver, Ordering medications, tests, or procedures, and Documenting clinical information in the electronic or other health record

## 2022-06-23 ENCOUNTER — Ambulatory Visit: Admit: 2022-06-23 | Discharge: 2022-06-23 | Payer: BC Managed Care – PPO

## 2022-06-23 ENCOUNTER — Encounter: Admit: 2022-06-23 | Discharge: 2022-06-23 | Payer: BC Managed Care – PPO

## 2022-06-23 DIAGNOSIS — E785 Hyperlipidemia, unspecified: Secondary | ICD-10-CM

## 2022-06-23 DIAGNOSIS — I10 Essential (primary) hypertension: Secondary | ICD-10-CM

## 2022-06-23 DIAGNOSIS — E88819 Insulin resistance: Secondary | ICD-10-CM

## 2022-06-23 DIAGNOSIS — C50919 Malignant neoplasm of unspecified site of unspecified female breast: Secondary | ICD-10-CM

## 2022-06-23 DIAGNOSIS — F3289 Other specified depressive episodes: Secondary | ICD-10-CM

## 2022-06-23 DIAGNOSIS — R12 Heartburn: Secondary | ICD-10-CM

## 2022-06-23 DIAGNOSIS — E669 Obesity, unspecified: Secondary | ICD-10-CM

## 2022-06-23 DIAGNOSIS — F419 Anxiety disorder, unspecified: Secondary | ICD-10-CM

## 2022-06-23 DIAGNOSIS — E282 Polycystic ovarian syndrome: Secondary | ICD-10-CM

## 2022-06-23 DIAGNOSIS — Z789 Other specified health status: Secondary | ICD-10-CM

## 2022-06-23 DIAGNOSIS — R000 Tachycardia, unspecified: Secondary | ICD-10-CM

## 2022-06-23 DIAGNOSIS — F32A Depression: Secondary | ICD-10-CM

## 2022-06-23 DIAGNOSIS — T7840XA Allergy, unspecified, initial encounter: Secondary | ICD-10-CM

## 2022-06-23 DIAGNOSIS — E039 Hypothyroidism, unspecified: Secondary | ICD-10-CM

## 2022-06-23 DIAGNOSIS — J302 Other seasonal allergic rhinitis: Secondary | ICD-10-CM

## 2022-06-23 DIAGNOSIS — Z923 Personal history of irradiation: Secondary | ICD-10-CM

## 2022-06-23 MED ORDER — PROPOFOL 10 MG/ML IV EMUL 50 ML (INFUSION)(AM)(OR)
INTRAVENOUS | 0 refills | Status: DC
Start: 2022-06-23 — End: 2022-06-23

## 2022-06-23 MED ORDER — LIDOCAINE (PF) 20 MG/ML (2 %) IJ SOLN
INTRAVENOUS | 0 refills | Status: DC
Start: 2022-06-23 — End: 2022-06-23

## 2022-06-23 NOTE — Anesthesia Post-Procedure Evaluation
Post-Anesthesia Evaluation    Name: Jennifer Durham      MRN: [id number]     DOB: 05-Dec-1975     Age: 47 y.o.     Sex: female   __________________________________________________________________________     Procedure Information       Anesthesia Start Date/Time: 06/23/22 0951    Procedure: COLONOSCOPY DIAGNOSTIC WITH SPECIMEN COLLECTION BY BRUSHING/ WASHING - FLEXIBLE    Location: [facility] / [id number]    Surgeons: Buckles, Vinnie Level, MD            Post-Anesthesia Vitals  BP: 149/74 (05/29 1035)  Temp: 36.5 ?C (97.7 ?F) (05/29 1017)  Pulse: 72 (05/29 1035)  Respirations: 17 PER MINUTE (05/29 1035)  SpO2: 99 % (05/29 1035)  O2 Device: None (Room air) (05/29 1035)  Height: 182.9 cm (6') (05/29 0925)   Vitals Value Taken Time   BP 149/74 06/23/22 1035   Temp 36.5 ?C (97.7 ?F) 06/23/22 1017   Pulse 72 06/23/22 1035   Respirations 17 PER MINUTE 06/23/22 1035   SpO2 99 % 06/23/22 1035   O2 Device None (Room air) 06/23/22 1035   ABP     ART BP           Post Anesthesia Evaluation Note    Evaluation location: Pre/Post  Patient participation: recovered; patient participated in evaluation  Level of consciousness: alert  Pain management: adequate    Hydration: normovolemia  Airway patency: adequate    Perioperative Events       Post-op nausea and vomiting: no PONV    Postoperative Status  Cardiovascular status: hemodynamically stable  Respiratory status: spontaneous ventilation  Follow-up needed: none        Perioperative Events  There were no known complications for this encounter.

## 2022-06-23 NOTE — Anesthesia Pre-Procedure Evaluation
Anesthesia Pre-Procedure Evaluation    Name: Jennifer Durham      MRN: [id number]     DOB: May 02, 1975     Age: 47 y.o.     Sex: female   _________________________________________________________________________     Procedure Info:   Procedure Information       Date/Time: 06/23/22 1000    Procedure: COLONOSCOPY DIAGNOSTIC WITH SPECIMEN COLLECTION BY BRUSHING/ WASHING - FLEXIBLE    Location: [id number]    Surgeons: Buckles, Vinnie Level, MD            Physical Assessment  Vital Signs (last filed in past 24 hours):    BP: 133/106 (05/29 0925)  Temp: 36.5 ?C (97.7 ?F) (05/29 8119)  Pulse: 83 (05/29 0925)  Respirations: 18 PER MINUTE (05/29 0925)  SpO2: 100 % (05/29 0925)  O2 Device: None (Room air) (05/29 0925)  Height: 182.9 cm (6') (05/29 0925)       Patient History   Allergies   Allergen Reactions    Tegaderm RASH        Current Medications    Medication Directions   CHOLEcalciferoL (vitamin D3) (OPTIMAL D3) 50,000 units capsule TAKE ONE CAPSULE BY MOUTH EVERY WEEK   ciclopirox (PENLAC) 8 % topical solution Apply topically to entire affected nail(s) once daily.  Indications: a fungal disease of the nails called dermatophyte onychomycosis   escitalopram oxalate (LEXAPRO) 10 mg tablet TAKE ONE TABLET BY MOUTH EVERY DAY   levothyroxine (SYNTHROID) 50 mcg tablet Take one tablet by mouth daily.   losartan (COZAAR) 50 mg tablet Take  by mouth.   metFORMIN (GLUCOPHAGE) 1,000 mg tablet Take one tablet by mouth daily with breakfast.   montelukast (SINGULAIR) 10 mg tablet Take one tablet by mouth at bedtime daily.   MOUNJARO 5 mg/0.5 mL injector EN    peg-electrolyte solution (NULYTELY LEMON-LIME) 420 gram oral solution OK to substitute for any generic golytely or Gavilyte-C, or Gavilyte-G, or Gavilyte-N.Mix as directed on package. Refrigerate once mixed. Do not mix greater than 24 hours prior to procedure. Drink 3/4 of bottle between 5pm and 7pm the night before procedure. Drink remaining 1/4 of bottle 5 hours prior to procedure.   rosuvastatin (CRESTOR) 5 mg tablet Take  by mouth.   tamoxifen (NOLVADEX) 20 mg tablet TAKE ONE TABLET BY MOUTH EVERY NIGHT AT BEDTIME         Review of Systems/Medical History      Patient summary reviewed  Nursing notes reviewed  Pertinent labs reviewed  Difficult IV access    PONV Screening: Non-smoker and Female sex    No history of anesthetic complications    No family history of anesthetic complications      Airway - negative        No TMJ      Pulmonary - negative      Not a current smoker        No indications/hx of asthma      no COPD         No recent URI       Obstructive Sleep Apnea: snoring.      Cardiovascular         Exercise tolerance: >4 METS (pt able to achieve 8.23 METs per [facility])      Beta Blocker therapy: No      Beta blockers within 24 hours: n/a      Hypertension, well controlled    No valvular problems/murmurs  No palpitations          Dysrhythmias (hx prior tachyarrhythmias in the periop setting; 11/2021 Holter unremarkable)    No angina      Hyperlipidemia      No orthopnea      No dyspnea on exertion      No syncope      GI/Hepatic/Renal             GERD ( occasional), well controlled        No liver disease:         No renal disease:         Bowel prep      Nausea      Gastroparesis: Mounjaro > 7 days ago.      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  A1c 6.2 on 03/12/22.        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    Abdominal Findings:       Obese      Abdomen soft    Neurological Findings:       Alert and oriented x 3    Constitutional findings:       No acute distress      Well-developed       Diagnostic Tests  Hematology:   Lab Results   Component Value Date    HGB 11.8 10/15/2021    HCT 35.9 10/15/2021    PLTCT 303 10/15/2021    WBC 7.99 10/15/2021    NEUT 69 10/28/2020    ANC 4.80 10/28/2020    ALC 1.10 10/28/2020    MONA 6 10/28/2020    AMC 0.40 10/28/2020    EOSA 8 10/28/2020    ABC 0.10 10/28/2020    MCV 87.3 10/15/2021  MCH 28.7 10/15/2021    MCHC 32.9 10/15/2021    MPV 9.0 10/15/2021    RDW 43.8 10/15/2021         General Chemistry:   Lab Results   Component Value Date    NA 140 01/05/2022    K 4.1 01/05/2022    CL 109 01/05/2022    CO2 25.0 01/05/2022    GAP 6 01/05/2022    BUN 13.0 01/05/2022    CR 0.98 01/05/2022    GLU 99 01/05/2022    CA 8.9 01/05/2022    ALBUMIN 3.6 10/15/2021    TOTBILI 0.35 10/15/2021      Coagulation: No results found for: PT, PTT, INR      Anesthesia Plan    ASA score: 3   Plan: MAC  Induction method: intravenous  NPO status: acceptable      Informed Consent  Anesthetic plan and risks discussed with patient.        Plan discussed with: anesthesiologist and CRNA.     11/04/21 Stress:  Left Ventricular Ejection Fraction (post stress, in the resting state) =  69 %.      Left Ventricular End Diastolic Volume: 81.19 mL      SUMMARY/OPINION:       1. Myocardial perfusion imaging is mildly abnormal.  No high risk features.  2. Pharmocological stress EKG shows no significant ST depression  3.  Small sized reversible defect of mild intensity, consistent with possible ischemia is present in the mid to distal anteroseptal wall.  Possibility of this defect being due to shifting breast attenuation cannot be excluded.  4.  Overall left ventricular systolic function is normal with calculated EF of 69%.  5.  Prior studies available for comparison: None available     In aggregate the current study is low risk in regards to predicted annual cardiovascular mortality rate.  This low predicted annual cardiovascular mortality rate is contingent upon the assumption that the patient has had adequate vasodilation with regadenoson by being compliant with the recommended caffeine free interval prior to the stress test. Low predicted risk is based on findings of the myocardial perfusion imaging test only and does not refer to overall patient risk which is determined by clinical factors in addition to the myocardial perfusion imaging findings.    Staff name:  Enos Fling, MD Date:  06/23/2022     [facility] Plan  Alerts

## 2022-06-24 ENCOUNTER — Encounter: Admit: 2022-06-24 | Discharge: 2022-06-24 | Payer: BC Managed Care – PPO

## 2022-06-24 DIAGNOSIS — R12 Heartburn: Secondary | ICD-10-CM

## 2022-06-24 DIAGNOSIS — Z789 Other specified health status: Secondary | ICD-10-CM

## 2022-06-24 DIAGNOSIS — E669 Obesity, unspecified: Secondary | ICD-10-CM

## 2022-06-24 DIAGNOSIS — R000 Tachycardia, unspecified: Secondary | ICD-10-CM

## 2022-06-24 DIAGNOSIS — E039 Hypothyroidism, unspecified: Secondary | ICD-10-CM

## 2022-06-24 DIAGNOSIS — F3289 Other specified depressive episodes: Secondary | ICD-10-CM

## 2022-06-24 DIAGNOSIS — J302 Other seasonal allergic rhinitis: Secondary | ICD-10-CM

## 2022-06-24 DIAGNOSIS — F32A Depression: Secondary | ICD-10-CM

## 2022-06-24 DIAGNOSIS — E282 Polycystic ovarian syndrome: Secondary | ICD-10-CM

## 2022-06-24 DIAGNOSIS — E88819 Insulin resistance: Secondary | ICD-10-CM

## 2022-06-24 DIAGNOSIS — Z923 Personal history of irradiation: Secondary | ICD-10-CM

## 2022-06-24 DIAGNOSIS — T7840XA Allergy, unspecified, initial encounter: Secondary | ICD-10-CM

## 2022-06-24 DIAGNOSIS — C50919 Malignant neoplasm of unspecified site of unspecified female breast: Secondary | ICD-10-CM

## 2022-06-24 DIAGNOSIS — F419 Anxiety disorder, unspecified: Secondary | ICD-10-CM

## 2022-06-24 DIAGNOSIS — I10 Essential (primary) hypertension: Secondary | ICD-10-CM

## 2022-06-24 DIAGNOSIS — E785 Hyperlipidemia, unspecified: Secondary | ICD-10-CM

## 2022-06-25 ENCOUNTER — Encounter: Admit: 2022-06-25 | Discharge: 2022-06-25 | Payer: BC Managed Care – PPO

## 2022-06-25 DIAGNOSIS — C186 Malignant neoplasm of descending colon: Secondary | ICD-10-CM

## 2022-06-28 ENCOUNTER — Encounter: Admit: 2022-06-28 | Discharge: 2022-06-28 | Payer: BC Managed Care – PPO

## 2022-06-28 DIAGNOSIS — C189 Malignant neoplasm of colon, unspecified: Secondary | ICD-10-CM

## 2022-06-30 ENCOUNTER — Encounter: Admit: 2022-06-30 | Discharge: 2022-06-30 | Payer: BC Managed Care – PPO

## 2022-06-30 ENCOUNTER — Ambulatory Visit: Admit: 2022-06-30 | Discharge: 2022-07-01 | Payer: BC Managed Care – PPO

## 2022-06-30 DIAGNOSIS — D229 Melanocytic nevi, unspecified: Secondary | ICD-10-CM

## 2022-06-30 DIAGNOSIS — D489 Neoplasm of uncertain behavior, unspecified: Secondary | ICD-10-CM

## 2022-06-30 NOTE — Patient Instructions
Shave Biopsy Site Care:  - Please keep bandage on for 24 hours  - You may remove bandage after 24 hours and clean with gentle soap (i.e. Dove, Cetaphil) and water  - Keep covered with vaseline and a bandage until healed, approximately 2-3 weeks  - Please call us if you develop swelling, pain, redness at biopsy site     - with any questions or concerns you can message Dr. Arieonna Medine or my nurse Adam (phone number: [phone] ). If it is after hours or the weekend please call the dermatology resident on call ([phone] and asking the page operator to contact the on-call Dermatology resident).

## 2022-06-30 NOTE — Progress Notes
Date of Service: 06/30/2022    Subjective:             Jennifer Durham is a 47 y.o. female.    History of Present Illness    Return patient. LV 03/2020 w/ Dr. Evie Lacks.    # Spot of concern   - on right upper abdomen  - present for 3-4 days ago but only noticed because a pimple was present and shelooked at the pimple and saw this  - does not hurt or bleed  - no past treatments or biopsies     # Brown spots on the head, trunk, arms, and legs  - Patient has a history of brown and tan spots distributed over the head, trunk, arms and legs.    - These have been present for many years.   - There is history of blistering sunburns. Remote history of tanning bed use 20 years ago, 2 x per week.   - None are itching, bleeding, or painful  - none are changing      Not addressed today:  # Dry skin  - Has been experiencing dry skin recently, especially on lower extremities  - Usually flares in colder weather  - Uses CeraVe moisturizer once daily    # Radiation rash  - Upper mid chest  - sometimes it can be itchy  Current Tx:  - Triamcinolone 0.1% ointment BID PRN  Interval Hx:  - Area now much improved, rarely uses TAC    Personal Hx: no history of skin cancer. history of breast cancer (ER+, PR+, HER2-).   Family Hx: no history of melanoma skin cancer  Social Hx: Runner, broadcasting/film/video. Nonsmoker.        Review of Systems   Constitutional:  Negative for appetite change and unexpected weight change.   Gastrointestinal:  Negative for diarrhea, nausea and vomiting.         Objective:          CHOLEcalciferoL (vitamin D3) (OPTIMAL D3) 50,000 units capsule TAKE ONE CAPSULE BY MOUTH EVERY WEEK    ciclopirox (PENLAC) 8 % topical solution Apply topically to entire affected nail(s) once daily.  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    losartan (COZAAR) 50 mg tablet Take  by mouth.    metFORMIN (GLUCOPHAGE) 1,000 mg tablet Take one tablet by mouth daily with breakfast.    montelukast (SINGULAIR) 10 mg tablet Take one tablet by mouth at bedtime daily.    MOUNJARO 5 mg/0.5 mL injector EN     rosuvastatin (CRESTOR) 5 mg tablet Take  by mouth.    tamoxifen (NOLVADEX) 20 mg tablet TAKE ONE TABLET BY MOUTH EVERY NIGHT AT BEDTIME     Vitals:    06/30/22 1304   Weight: 136.1 kg (300 lb)   Height: 182.9 cm (6')     Body mass index is 40.69 kg/m?Marland Kitchen     Physical Exam  Gen: No acute distress, awake and alert  Eyes: No conjunctival injection, EOM grossly normal    Areas Examined (all normal unless noted below):  Head/Face  Neck  Chest/axillae  Back  Abdomen  Declined FBSE today     Breast/Groin exam declined    Pertinent findings include:    Multiple brown and tan evenly pigmented macules are distributed over the examined areas.  All have symmetric similar dermoscopic findings with primarily globular and reticular patterns.    Pink, violaceous papules with positive dimple sign and central starburst pattern with peripheral fine tan reticular network on dermoscopy    Right inferior lateral chest is a 1.3x1.2cm pink and brown atrophic patch (biopsied today)       Assessment and Plan:    # Neoplasm of uncertain behavior   - shave biopsy done today without destruction of base     TANGENTIAL/SHAVE BIOPSY  Written consent obtained. Time out taken according to policy.   Anesthetic: 1% lidocaine with 1:100,000 epinephrine injected.   PROCEDURE: total lesion shave removal   Diagnosis: rule out atypical nevus vs PIH vs extragenital LSnA  Location of lesion: Right inferior lateral chest  Lesion size: see above   Hemostasis: aluminum chloride  Dressing applied.  Wound care instructions given.    # Melanocytic nevi  - ABCD negative as documented in physical examination above.  - Continue to monitor.  - Counseled extensively on photoprotection and sun protective behavior (wearing sun protective clothing, wearing broad brimmed hat, applying sunscreen SPF 30+ with reapplication every 2 hours, seeking shade/avoiding midday sun), ABCDEs of melanoma, and routine self skin examinations. Encouraged to call/contact us with any new/changing lesions/concerns.        Not addressed today, kept in for historical context   # Onychomycosis  - Discussed diagnosis and treatment options including observation, topical therapy and oral therapy  - Start Rx: Topical Ciclopirox, apply to entire affected toenail(s) once daily  - Discussed that the whole toenail needs to recycle to be cured, this may take 1-1.5 years    # Xerosis of skin  - Gentle moisturizer BID to entire body, provided with recommendations  - decrease shower time and use lukewarm water  - only use body wash to axillae, groin, feet    # History of breast cancer  # Melanocytic nevi  - Will cont to monitor  - RTC for new/changing lesions  - provided patient with information on ABCD's of melanoma and photoprotection (including OTC sunscreen SPF 30+), and vitamin D    # Dermatofibroma  - reassurance of benign nature  - discussed excision if pt bothered by lesion    # Cherry Angiomas  - reassurance   - explained benign nature    # Radiation dermatitis  - Patient reports mostly resolved  - if symptomatic with itching, may use triamcinolone 0.1% cream BID prn up to 2 weeks per month. Do not use on face/armpits/groin/normal skin.     RTCin 17mo for FBSE or sooner pending biopsy results

## 2022-07-01 ENCOUNTER — Encounter: Admit: 2022-07-01 | Discharge: 2022-07-01 | Payer: BC Managed Care – PPO

## 2022-07-01 DIAGNOSIS — R000 Tachycardia, unspecified: Secondary | ICD-10-CM

## 2022-07-01 DIAGNOSIS — F32A Depression: Secondary | ICD-10-CM

## 2022-07-01 DIAGNOSIS — F419 Anxiety disorder, unspecified: Secondary | ICD-10-CM

## 2022-07-01 DIAGNOSIS — F3289 Other specified depressive episodes: Secondary | ICD-10-CM

## 2022-07-01 DIAGNOSIS — E785 Hyperlipidemia, unspecified: Secondary | ICD-10-CM

## 2022-07-01 DIAGNOSIS — R12 Heartburn: Secondary | ICD-10-CM

## 2022-07-01 DIAGNOSIS — J302 Other seasonal allergic rhinitis: Secondary | ICD-10-CM

## 2022-07-01 DIAGNOSIS — Z789 Other specified health status: Secondary | ICD-10-CM

## 2022-07-01 DIAGNOSIS — T7840XA Allergy, unspecified, initial encounter: Secondary | ICD-10-CM

## 2022-07-01 DIAGNOSIS — E88819 Insulin resistance: Secondary | ICD-10-CM

## 2022-07-01 DIAGNOSIS — E282 Polycystic ovarian syndrome: Secondary | ICD-10-CM

## 2022-07-01 DIAGNOSIS — I10 Essential (primary) hypertension: Secondary | ICD-10-CM

## 2022-07-01 DIAGNOSIS — Z923 Personal history of irradiation: Secondary | ICD-10-CM

## 2022-07-01 DIAGNOSIS — E039 Hypothyroidism, unspecified: Secondary | ICD-10-CM

## 2022-07-01 DIAGNOSIS — E669 Obesity, unspecified: Secondary | ICD-10-CM

## 2022-07-01 DIAGNOSIS — C50919 Malignant neoplasm of unspecified site of unspecified female breast: Secondary | ICD-10-CM

## 2022-07-01 MED ORDER — CARVEDILOL 6.25 MG PO TAB
6.25 mg | ORAL_TABLET | Freq: Two times a day (BID) | ORAL | 3 refills | 90.00000 days | Status: AC
Start: 2022-07-01 — End: ?

## 2022-07-03 ENCOUNTER — Encounter: Admit: 2022-07-03 | Discharge: 2022-07-03 | Payer: BC Managed Care – PPO

## 2022-07-03 ENCOUNTER — Ambulatory Visit: Admit: 2022-07-03 | Discharge: 2022-07-03 | Payer: BC Managed Care – PPO

## 2022-07-03 DIAGNOSIS — C189 Malignant neoplasm of colon, unspecified: Secondary | ICD-10-CM

## 2022-07-03 LAB — CBC AND DIFF
ABSOLUTE BASO COUNT: 0 K/UL (ref 0–0.20)
ABSOLUTE EOS COUNT: 0.3 K/UL (ref 0–0.45)
ABSOLUTE LYMPH COUNT: 1.3 K/UL (ref 1.0–4.8)
ABSOLUTE MONO COUNT: 0.4 K/UL (ref 0–0.80)
ABSOLUTE NEUTROPHIL: 4.1 K/UL (ref 1.8–7.0)
BASOPHILS %: 1 % (ref 0–2)
EOSINOPHILS %: 5 % — ABNORMAL LOW (ref >60)
HEMATOCRIT: 33 % — ABNORMAL LOW (ref 36–45)
LYMPHOCYTES %: 21 % — ABNORMAL LOW (ref 24–44)
MCH: 26 pg (ref 26–34)
MCHC: 32 g/dL (ref 32.0–36.0)
MCV: 81 FL (ref 80–100)
MONOCYTES %: 7 % (ref 4–12)
MPV: 7.7 FL (ref 7–11)
NEUTROPHILS %: 66 % (ref 41–77)
PLATELET COUNT: 262 K/UL (ref 150–400)
RBC COUNT: 4.1 M/UL (ref 4.0–5.0)
RDW: 16 % — ABNORMAL HIGH (ref 11–15)
WBC COUNT: 6.2 K/UL (ref 4.5–11.0)

## 2022-07-03 LAB — POC CREATININE, RAD: CREATININE, POC: 1.3 mg/dL — ABNORMAL HIGH (ref 0.4–1.00)

## 2022-07-03 LAB — CEA(CARCINOEMBRYONIC AG): CEA: 0.8 ng/mL (ref <3.0)

## 2022-07-03 LAB — COMPREHENSIVE METABOLIC PANEL
POTASSIUM: 4.1 MMOL/L (ref 3.5–5.1)
SODIUM: 142 MMOL/L (ref 137–147)

## 2022-07-03 MED ORDER — SODIUM CHLORIDE 0.9 % IJ SOLN
50 mL | Freq: Once | INTRAVENOUS | 0 refills | Status: CP
Start: 2022-07-03 — End: ?
  Administered 2022-07-03: 18:00:00 50 mL via INTRAVENOUS

## 2022-07-03 MED ORDER — IOHEXOL 350 MG IODINE/ML IV SOLN
130 mL | Freq: Once | INTRAVENOUS | 0 refills | Status: CP
Start: 2022-07-03 — End: ?
  Administered 2022-07-03: 18:00:00 130 mL via INTRAVENOUS

## 2022-07-06 ENCOUNTER — Encounter: Admit: 2022-07-06 | Discharge: 2022-07-06 | Payer: BC Managed Care – PPO

## 2022-07-07 ENCOUNTER — Encounter: Admit: 2022-07-07 | Discharge: 2022-07-07 | Payer: BC Managed Care – PPO

## 2022-07-14 ENCOUNTER — Encounter: Admit: 2022-07-14 | Discharge: 2022-07-14 | Payer: BC Managed Care – PPO

## 2022-07-14 ENCOUNTER — Inpatient Hospital Stay: Admit: 2022-07-14 | Discharge: 2022-07-14 | Payer: BC Managed Care – PPO

## 2022-07-14 DIAGNOSIS — F3289 Other specified depressive episodes: Secondary | ICD-10-CM

## 2022-07-14 DIAGNOSIS — R12 Heartburn: Secondary | ICD-10-CM

## 2022-07-14 DIAGNOSIS — F419 Anxiety disorder, unspecified: Secondary | ICD-10-CM

## 2022-07-14 DIAGNOSIS — R000 Tachycardia, unspecified: Secondary | ICD-10-CM

## 2022-07-14 DIAGNOSIS — C189 Malignant neoplasm of colon, unspecified: Secondary | ICD-10-CM

## 2022-07-14 DIAGNOSIS — E669 Obesity, unspecified: Secondary | ICD-10-CM

## 2022-07-14 DIAGNOSIS — Z08 Encounter for follow-up examination after completed treatment for malignant neoplasm: Secondary | ICD-10-CM

## 2022-07-14 DIAGNOSIS — E785 Hyperlipidemia, unspecified: Secondary | ICD-10-CM

## 2022-07-14 DIAGNOSIS — I10 Essential (primary) hypertension: Secondary | ICD-10-CM

## 2022-07-14 DIAGNOSIS — E88819 Insulin resistance: Secondary | ICD-10-CM

## 2022-07-14 DIAGNOSIS — T7840XA Allergy, unspecified, initial encounter: Secondary | ICD-10-CM

## 2022-07-14 DIAGNOSIS — Z923 Personal history of irradiation: Secondary | ICD-10-CM

## 2022-07-14 DIAGNOSIS — I890 Lymphedema, not elsewhere classified: Secondary | ICD-10-CM

## 2022-07-14 DIAGNOSIS — E039 Hypothyroidism, unspecified: Secondary | ICD-10-CM

## 2022-07-14 DIAGNOSIS — E282 Polycystic ovarian syndrome: Secondary | ICD-10-CM

## 2022-07-14 DIAGNOSIS — Z789 Other specified health status: Secondary | ICD-10-CM

## 2022-07-14 DIAGNOSIS — F32A Depression: Secondary | ICD-10-CM

## 2022-07-14 DIAGNOSIS — J302 Other seasonal allergic rhinitis: Secondary | ICD-10-CM

## 2022-07-14 DIAGNOSIS — C50919 Malignant neoplasm of unspecified site of unspecified female breast: Secondary | ICD-10-CM

## 2022-07-14 DIAGNOSIS — Z9889 Other specified postprocedural states: Secondary | ICD-10-CM

## 2022-07-14 DIAGNOSIS — C50411 Malignant neoplasm of upper-outer quadrant of right female breast: Secondary | ICD-10-CM

## 2022-07-14 MED ORDER — PRESURGERY KIT A
PACK | 0 refills | Status: AC
Start: 2022-07-14 — End: ?
  Filled 2022-07-14: qty 1, 1d supply, fill #1

## 2022-07-14 MED ORDER — NALOXEGOL 12.5 MG PO TAB
25 mg | Freq: Once | ORAL | 0 refills
Start: 2022-07-14 — End: ?

## 2022-07-14 MED ORDER — CEFAZOLIN 2 GRAM IV SOLR
2 g | Freq: Once | INTRAVENOUS | 0 refills
Start: 2022-07-14 — End: ?

## 2022-07-14 MED ORDER — NEOMYCIN 500 MG PO TAB
ORAL_TABLET | 0 refills | Status: AC
Start: 2022-07-14 — End: ?
  Filled 2022-07-14: qty 6, 1d supply, fill #1

## 2022-07-14 MED ORDER — METRONIDAZOLE 500 MG PO TAB
ORAL_TABLET | 0 refills | Status: AC
Start: 2022-07-14 — End: ?
  Filled 2022-07-14: qty 6, 1d supply, fill #1

## 2022-07-14 MED ORDER — ACETAMINOPHEN 500 MG PO TAB
1000 mg | Freq: Once | ORAL | 0 refills
Start: 2022-07-14 — End: ?

## 2022-07-14 MED ORDER — METRONIDAZOLE IN NACL (ISO-OS) 500 MG/100 ML IV PGBK
500 mg | Freq: Once | INTRAVENOUS | 0 refills
Start: 2022-07-14 — End: ?

## 2022-07-14 MED ORDER — GABAPENTIN 300 MG PO CAP
600 mg | Freq: Once | ORAL | 0 refills
Start: 2022-07-14 — End: ?

## 2022-07-14 MED ORDER — CELECOXIB 200 MG PO CAP
200 mg | Freq: Once | ORAL | 0 refills
Start: 2022-07-14 — End: ?

## 2022-07-14 MED ORDER — SODIUM CHLORIDE 0.9 % IV SOLP
250 mL | INTRAVENOUS | 0 refills
Start: 2022-07-14 — End: ?

## 2022-07-14 NOTE — Progress Notes
Bioimpedance Spectroscopy performed.  Advised patient that additional information will be sent via Mychart (preferred) or phone if indicated by the lymphedema nurse within 24 hours.

## 2022-07-14 NOTE — Progress Notes
PATIENT NAME:  Jennifer Durham  DATE OF BIRTH:  June 26, 1975  MRN:   [id number]    Reason for Visit:  Initial visit for elevated BIS in coordination with Hoyle Sauer, PA visit.   Breast Surgery History: 08/21/19 - Bilateral skin sparing mastectomy, Right SLNB to ALND (1/34), reconstruction with Dr. Vear Clock and Dr. Fran Lowes    Risk Factors:  BMI over 30 at diagnosis: Yes, 43.2 on 07/16/19  Change in BMI from initial diagnosis: Decrease - 41.50 at today's visit  Radiation therapy: Yes, completed 03/12/19  Chemotherapy: Yes, TC 09/26/19- 11/28/19   Other contributing factors: Yes, HTN    Social History:  Limitations in performing daily activities: No  Activity level: Light - patient reported walking on a regular basis.     Recent history:   Injuries/ bug bites: No  Infections: No  Change in medical status:  Yes, recently diagnosed colon cancer    Interval Lymphedema History:  History of elevated BIS results: Yes  History of lymphedema interventions: Yes  History of Lymphedema PT/OT: No  Cellulitis: No  LVB or ILR: No    Compression Garments:  Daytime compression sleeve:  garment not available to obtain size  - Recommended Jobst Bella Strong size 10 based on today's measurements.   Daytime compression hand piece: N/A   Nighttime compression: N/A  Compression bra/camisole: N/A  Correct size and fit of compression garments verified: Pending    Current Regimen:  Daytime compression: with activities  Nighttime compression: N/A  Stretching exercises: N/A  Self-Massage (MLD): N/A  Lymphedema pump: N/A        Lymphedema Timeline   Date Lymphedema Status Intervention Outcome Notes   07/14/22 Stage 1 At home  compression  MLD  stretches New Onset Obtain new compression sleeve - Recommended Jobst Bella Strong size 10 CCL1          Patient Reported Lymphedema Signs and Symptoms:  Aching: N/A  Heaviness: intermittent  Fullness: intermittent  Swelling: N/A  Onset of signs and symptoms:  Noticed right upper arm fullness after walking outside. Patient reported that she does not wear her compression sleeve with this activity.   Aggravating factors: heat/humidity, activity  Alleviating factors: N/A    Lymphedema Assessment:    Bioimpedance Spectroscopy 07/14/22   Current 15.5   Baseline 2.4   Change from Baseline 13.1       Circumferential Measurements completed 07/14/22   RUE / LUE cm   Hand:  22.6 /  20.9    Wrist:  19.4 /  18.8     8 cm:  25.0 /  23.5   16 cm:  32.4 /  30.5   Elbow:  34.1 /  32.6   8 cm:  39.6 /  36.6   16 cm:  43.0 /  39.5   24 cm:  44.8 /  42.6   Right axillary crease: 45.5  Right arm length: 47.0  Right mid lower arm: 33.7  Right mid upper arm: 42.0     Right upper extremity Assessment:  Skin: pink, soft, pliable, and intact   Erythema, ulcerations, or rash: Not present   Edema: Present, visible fullness in right arm as compared to left     Fibrosis: Not present   Stemmer's: Not present   Visualization of vasculature and bony prominences:  Slightly decreased on right as compared to left    Radiation skin changes: N/A   Cording:  Patient reported a pulling sensation in her right axilla that extends  in to her upper right arm with arm abduction.    ROM: Full range of motion. No limitations.   Strength: WNL    Lymphedema Diagnosis at today's visit: Stage 1    Education/Interventions:  Reviewed assessment with patient including BIS results and circumferential arm measurements.   Discussed BIS results are elevated and bilateral circumferential arm measurements have areas of significant (>2 cm) variance between arms.  Early lymphedema signs and symptoms, potential aggravating factors, lymphatic drainage pathways, treatment interventions, and risk of not treating reviewed.   Rationale and goal of at home early treatment interventions explained.   Discussed the importance of compression with appropriate fitting compression sleeve.   Printed handout with step-by-step instructions of the interventions discussed during today's visit provided.  Verified patient is aware of worsening signs and symptoms to watch for and when to notify the clinic.     Recommendations:  Obtain new compression sleeve after today's visit.   Wear compression sleeve daily while awake.   Affected side crawl the wall stretch and modified gentle self-massage (MLD) twice daily, morning and night.   Continue with initial ALND risk-reduction precautions and meticulous skin care. Weight management and an active healthy lifestyle are beneficial.     Plan:  Return to clinic in 4. Patient agreeable to recommendations and plan. All questions answered. Verified patient has contact information should any questions arise.      07/14/22

## 2022-07-14 NOTE — Research Notes
DECLINE Note    -----IIT-2022-VIVA Clinical Trial -----   Protocol Name: IIT-2022-VIVA  Study Title: VIVA: Volatile or IV Anesthesia for Cancer  HSC# [id number]  Consent Version Date: Approval Period 01/11/2022 - 01/12/2023     Clinical trial participation and research nature of the trial were discussed with the participant, subject wife, Lawson Fiscal, was present. Participant was alert and oriented during consent discussion. Participant was informed that clinical trial is voluntary and they may withdraw consent at any time for any reason by notifying study team. Study purpose, procedures, tests, samples to be obtained, potential side effects, benefits, foreseeable risks and duration of study were discussed. HIPAA information and compensation were discussed per consent form. Alternative courses of treatment were also discussed per consent. Participant verbalized understanding.       Participant was given time to review the consent form and to discuss participation in this study. Questions asked were answered to his satisfaction and participant voiced desire NOT TO PARTICIPATE, subject did not sign consent.        No research specific procedures took place.      Beryle Beams

## 2022-07-15 ENCOUNTER — Encounter: Admit: 2022-07-15 | Discharge: 2022-07-15 | Payer: BC Managed Care – PPO

## 2022-07-15 NOTE — Progress Notes
Received a call from PAT asking if patient should hold her tamoxifen before colectomy surgery.   Patient can hold her tamoxifen today and resume  2 weeks post surgery per Dr. Milta Deiters recommendation

## 2022-07-19 NOTE — Progress Notes
Radiation Oncology Follow Up Note  Date: 07/21/2022       Jennifer Durham is a 47 y.o. female.     The encounter diagnosis was Malignant neoplasm of upper-outer quadrant of right breast in female, estrogen receptor positive (HCC).  Staging:  Cancer Staging   Malignant neoplasm of upper-outer quadrant of right breast in female, estrogen receptor positive (HCC)  Staging form: Breast, AJCC 8th Edition  - Clinical stage from 07/04/2019: Stage IA (cT1c, cN0(f), cM0, G2, ER+, PR+, HER2-) - Signed by Guy Begin, PA-C on 07/12/2019  - Pathologic stage from 08/21/2019: Stage IA (pT1c, pN1a, cM0, G2, ER+, PR+, HER2-) - Signed by Guy Begin, PA-C on 08/30/2019      History of Present Illness  Ms. Jennifer Durham is a 47 y.o. female with hx of stage IA ER/PR+, HER2- IDC of the right breast diagnosed in 06/2019 after detecting dimpling of her breast in 05/2019. She presented for mammogram which identified a spiculated mass in the upper right medial breast. She underwent ultrasound and biopsy that confirmed carcinoma at that time. She presented to [place] for further evaluation. Pathology review at [place] confirmed grade 2 IDC with a focal lobular growth pattern and lymphoid tissue with no evidence of carcinoma in the right axillary specimen. She was recommended to undergo upfront surgery. On 08/01/2019, she underwent bilateral skin sparing mastectomy and right sentinel lymph node biopsy, axilla lymph node dissection, and reconstruction.  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  A 5 mm focus of metastatic carcinoma was found in 1 of 4 sentinel lymph nodes.  0 of 27 additional axillary nodes were involved. pT1c N1a M0 (1 of 34 LN+) M0. No LVI, No EIC, No ENE. She was recommended to receive adjuvant chemotherapy with TC x4 cycles, which she completed 11/28/2019. She was recommended to receive adjuvant radiation therapy.      Diagnosis:   Stage 1a ER/PR+, HER2- IDC of the right breast s/p bilateral SSM, right ALND and reconstruction     Treatment History:   Patient underwent adjuvant radiotherapy to her right reconstructed breast, chest wall and regional nodes to a dose of 4526 cGy in 16 fractions from 02/19/2020 - 03/11/2020. She tolerated treatment well and had no treatment interruptions.     Subjective:       Jennifer Durham returns today for routine follow-up. She is unaccompanied to today's visit. She is doing well overall. She notes her fatigue is improving. She denies pain or tenderness in her breast. She does have some discomfort with range of motion and feels some tightness in her axilla/lateral chest wall. She does not find that this limits her activities. She does do some stretches to help with this along with her lymphedema exercises. She has some shortness of air with exertion, which is chronic in nature and unchanged recently. She denies associated chest pain, cough or fevers. She has experienced a decrease in visual acuity over the past year but denies other neurologic symptoms, such as headaches or incoordination. She does occasionally experience some lightheadedness with rapid position changes. She has experienced tachycardia and saw cardiology for this; her HCTZ was stopped as it was thought her symptoms may be related to dehydration. She has had some improvement in symptoms since this was stopped. She has experienced itching of her left inframammary fold recently. This started around Thanksgiving and has persisted, though occurs intermittently. She has not changed  any skin care products. She has some joint pain with tamoxifen but otherwise tolerate this well. 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     Subjective:       Jennifer Durham returns today for routine follow-up. She is unaccompanied to today's visit. Doing okay. Did have a skin lesion on the inferior aspect of her right reconstructed breast. 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 No other changes with her reconstructed breasts. No pain or tenderness of her chest wall. Range of motion is somewhat limited in her right shoulder and she has a bit of discomfort when raising her right arm. She does not find that this interferes with her activities. She experiences rare SOA that occurs randomly but is transient. No chest pain, cough or fevers. Patient denies neurologic symptoms such as headache, dizziness or vision changes. Unfortunately, she has recently been found to have colon cancer. Scans fortunately have not shown metastatic disease so she is hopeful that she will only require surgery and no adjuvant treatment. Surgery is scheduled on Friday.      Review of Systems   Constitutional:  Positive for fatigue. Negative for fever.   HENT:  Negative for sore throat and trouble swallowing.    Eyes:  Negative for visual disturbance.   Respiratory:  Negative for cough and shortness of breath.    Cardiovascular:  Negative for chest pain.   Musculoskeletal:  Negative for arthralgias and myalgias.   Skin:  Negative for color change.   Neurological:  Negative for light-headedness and headaches.   Hematological:  Negative for adenopathy.   Psychiatric/Behavioral:  The patient is not nervous/anxious.    All other systems reviewed and are negative.      Objective:          carvediloL (COREG) 6.25 mg tablet Take one tablet by mouth twice daily with meals. Take with food.    CHOLEcalciferoL (vitamin D3) (OPTIMAL D3) 50,000 units capsule sundays    ciclopirox (PENLAC) 8 % topical solution Apply topically to entire affected nail(s) once daily.  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    levothyroxine (SYNTHROID) 50 mcg tablet Take one tablet by mouth daily.    losartan (COZAAR) 50 mg tablet Take  by mouth.    metFORMIN (GLUCOPHAGE) 1,000 mg tablet Take one tablet by mouth daily with breakfast.    metroNIDAZOLE (FLAGYL) 500 mg tablet Please take at the following times:   1pm: Take 2 tablets of Flagyl (500mg  tablets)   3pm: Take 2 tablets of Flagyl (500mg  tablets)   11pm: Take 2 tablets of Flagyl (500mg  tablets)  *Please try to take all the antibiotics.  Indications: ERAS pre-surgical preparation    montelukast (SINGULAIR) 10 mg tablet Take one tablet by mouth at bedtime daily.    MOUNJARO 5 mg/0.5 mL injector EN     neomycin (MYCIFRADIN) 500 mg tablet Please take at the following times:   1pm: Take 2 tablets of Neomycin (500mg  tablets)    3pm: Take 2 tablets of Neomycin (500mg  tablets)    11pm: Take 2 tablets of Neomycin (500mg  tablets)   *Please try to take all the antibiotics.  Indications: ERAS pre-surgical preparation    rosuvastatin (CRESTOR) 5 mg tablet Take  by mouth.    tamoxifen (NOLVADEX)  20 mg tablet TAKE ONE TABLET BY MOUTH EVERY NIGHT AT BEDTIME     Vitals:    07/21/22 0803   BP: 100/55   Pulse: 102   Temp: 36.6 ?C (97.9 ?F)   Resp: 18   SpO2: 97%   PainSc: Zero   Weight: (!) 138.3 kg (304 lb 12.8 oz)     Body mass index is 41.34 kg/m?Marland Kitchen     Pain Score: Zero        Fatigue Scale: 3    KARNOFSKY PERFORMANCE SCORE:  100% Normal, no complaints     Physical Exam  Vitals reviewed.   Constitutional:       General: She is awake. She is not in acute distress.     Appearance: Normal appearance. She is not ill-appearing.   HENT:      Head: Normocephalic and atraumatic.      Right Ear: External ear normal.      Left Ear: External ear normal.   Eyes:      General: Lids are normal.      Extraocular Movements: Extraocular movements intact.      Conjunctiva/sclera: Conjunctivae normal.   Cardiovascular:      Rate and Rhythm: Normal rate and regular rhythm.   Pulmonary:      Effort: Pulmonary effort is normal. No respiratory distress.      Breath sounds: Normal breath sounds.   Chest:      Comments: Chest wall/reconstructed breast exam deferred per pt; had recent exam  Musculoskeletal:      Cervical back: Neck supple.   Skin:     General: Skin is warm and dry.      Coloration: Skin is not pale.   Neurological:      General: No focal deficit present.      Mental Status: She is alert and oriented to person, place, and time. Mental status is at baseline.      Cranial Nerves: No cranial nerve deficit.   Psychiatric:         Attention and Perception: Attention normal.         Mood and Affect: Mood and affect normal.         Speech: Speech normal.         Behavior: Behavior normal.         Cognition and Memory: Cognition and memory normal.            Laboratory:    Comprehensive Metabolic Profile    Lab Results   Component Value Date/Time    NA 142 07/03/2022 12:33 PM    K 4.1 07/03/2022 12:33 PM    CL 105 07/03/2022 12:33 PM    CO2 27 07/03/2022 12:33 PM    GAP 10 07/03/2022 12:33 PM    BUN 12 07/03/2022 12:33 PM    CR 1.3 (H) 07/03/2022 01:04 PM    CR 1.18 (H) 07/03/2022 12:33 PM    GLU 99 07/03/2022 12:33 PM    Lab Results   Component Value Date/Time    CA 8.9 07/03/2022 12:33 PM    ALBUMIN 3.9 07/03/2022 12:33 PM    TOTPROT 6.9 07/03/2022 12:33 PM    ALKPHOS 52 07/03/2022 12:33 PM    AST 21 07/03/2022 12:33 PM    ALT 10 07/03/2022 12:33 PM    TOTBILI 0.4 07/03/2022 12:33 PM    GFR 65.2 01/05/2022 12:00 AM    GFRAA >60 11/28/2019 08:10 AM        CBC w  diff    Lab Results   Component Value Date/Time    WBC 6.2 07/03/2022 12:33 PM    RBC 4.13 07/03/2022 12:33 PM    HGB 11.0 (L) 07/03/2022 12:33 PM    HCT 33.5 (L) 07/03/2022 12:33 PM    MCV 81.0 07/03/2022 12:33 PM    MCH 26.6 07/03/2022 12:33 PM    MCHC 32.8 07/03/2022 12:33 PM    RDW 16.3 (H) 07/03/2022 12:33 PM    PLTCT 262 07/03/2022 12:33 PM    MPV 7.7 07/03/2022 12:33 PM    Lab Results   Component Value Date/Time    NEUT 66 07/03/2022 12:33 PM    ANC 4.10 07/03/2022 12:33 PM    LYMA 21 (L) 07/03/2022 12:33 PM    ALC 1.31 07/03/2022 12:33 PM    MONA 7 07/03/2022 12:33 PM    AMC 0.40 07/03/2022 12:33 PM    EOSA 5 07/03/2022 12:33 PM    AEC 0.33 07/03/2022 12:33 PM    BASA 1 07/03/2022 12:33 PM    ABC 0.04 07/03/2022 12:33 PM          Imaging:   CT CHEST W CONTRAST  Narrative: CT CHEST, ABDOMEN AND PELVIS    Clinical Indication:  Female, 47 years old. Malignant neoplasm of colon, unspecified part of colon. Newly diagnosed colon cancer. Initial staging/surgical and treatment planning. Recent colonoscopy and biopsy of the ascending colon mass demonstrating moderately differentiated adenocarcinoma. Additional large rectosigmoid polyp demonstrating tubulovillous adenoma.    Technique: Multiple contiguous axial images were obtained through the chest, abdomen and pelvis following the administration of IV contrast material. Post processing coronal and sagittal reconstruction images were made from the axial images.      IV contrast: Yes  Bowel contrast:  None    Comparison: CTA abdomen and pelvis on 04/21/2020. Correlation also made with coronary CTA chest on 01/05/2022.    CHEST FINDINGS:    Lower Neck: Unremarkable.    Axilla, Mediastinum and Hila: Axillary surgical clips. No thoracic lymphadenopathy.     Heart and Great Vessels: Unremarkable.    Airway, Lungs and Pleura: Central airways patent. Mild scarring and/or atelectasis. Calcified granuloma left lower lobe. Unchanged sub-5 mm nodular opacity right lower lobe along the major fissure, likely benign fissural lymph node (series 3 image 42). Additional unchanged sub-5 mm nodular opacity in the right lower lobe (series 3 image 61). No additional soft tissue pulmonary mass. No pleural effusion.    Chest Wall and Osseous Structures: Bilateral mastectomies with flap reconstruction. No destructive osseous lesion.     ABDOMEN AND PELVIS FINDINGS:    Liver and Biliary system: Persistent mild hepatomegaly with diffuse steatosis. No enhancing hepatic mass. No biliary ductal dilatation.     Spleen: Unremarkable.    Adrenal Glands and Kidneys: Unremarkable.    Pancreas and Retroperitoneum: Unremarkable.    Aorta and Major Vessels: Normal caliber abdominal aorta. Trace atherosclerotic plaque.     Bowel, Mesentery and Peritoneal space: Short segment circumferential thickening with minimal adjacent stranding at the splenic flexure of the colon, likely corresponding to recently biopsied malignancy (series 3 image 111). Several subcentimeter adjacent pericolonic lymph nodes (for example series 3 image 118).    Large irregular intraluminal masslike soft tissue in the region of the rectosigmoid colon, likely corresponding to reported polyp on recent colonoscopy (series 3 image 192).    No bowel obstruction. Normal appendix. No mesenteric lymphadenopathy or ascites.    Pelvis: IUD in the fundal endometrial canal. Uterus and  adnexa are otherwise within normal limits for age by CT. Unremarkable urinary bladder. No pelvic lymphadenopathy.    Abdominal wall and Osseous Structures: Surgical clips along the ventral abdominal wall from breast flap reconstruction. No destructive osseous lesion.  Impression: CHEST:  1. Bilateral mastectomies with flap construction. No thoracic lymphadenopathy.    2. Unchanged indeterminate sub-5 mm nodular opacity in the right lower lobe since 01/05/2022. No new pulmonary mass or pleural effusion.      ABDOMEN AND PELVIS:  1. Short segment circumferential thickening with minimal adjacent stranding at the splenic flexure of the colon, likely corresponding to recently biopsied malignancy. Several subcentimeter adjacent pericolonic lymph nodes are indeterminate.    2. Large irregular intraluminal masslike soft tissue in the region of the rectosigmoid colon, likely corresponding to reported polyp on recent colonoscopy.    3. No abdominopelvic metastatic disease.    4. Persistent hepatomegaly with diffuse steatosis.     Finalized by Ancil Boozer, M.D. on 07/05/2022 9:44 AM. Dictated by Ancil Boozer, M.D. on 07/05/2022 9:23 AM.  CT ABD/PELV W CONTRAST  Narrative: CT CHEST, ABDOMEN AND PELVIS    Clinical Indication:  Female, 47 years old. Malignant neoplasm of colon, unspecified part of colon. Newly diagnosed colon cancer. Initial staging/surgical and treatment planning. Recent colonoscopy and biopsy of the ascending colon mass demonstrating moderately differentiated adenocarcinoma. Additional large rectosigmoid polyp demonstrating tubulovillous adenoma.    Technique: Multiple contiguous axial images were obtained through the chest, abdomen and pelvis following the administration of IV contrast material. Post processing coronal and sagittal reconstruction images were made from the axial images.      IV contrast: Yes  Bowel contrast:  None    Comparison: CTA abdomen and pelvis on 04/21/2020. Correlation also made with coronary CTA chest on 01/05/2022.    CHEST FINDINGS:    Lower Neck: Unremarkable.    Axilla, Mediastinum and Hila: Axillary surgical clips. No thoracic lymphadenopathy.     Heart and Great Vessels: Unremarkable.    Airway, Lungs and Pleura: Central airways patent. Mild scarring and/or atelectasis. Calcified granuloma left lower lobe. Unchanged sub-5 mm nodular opacity right lower lobe along the major fissure, likely benign fissural lymph node (series 3 image 42). Additional unchanged sub-5 mm nodular opacity in the right lower lobe (series 3 image 61). No additional soft tissue pulmonary mass. No pleural effusion.    Chest Wall and Osseous Structures: Bilateral mastectomies with flap reconstruction. No destructive osseous lesion.     ABDOMEN AND PELVIS FINDINGS:    Liver and Biliary system: Persistent mild hepatomegaly with diffuse steatosis. No enhancing hepatic mass. No biliary ductal dilatation.     Spleen: Unremarkable.    Adrenal Glands and Kidneys: Unremarkable.    Pancreas and Retroperitoneum: Unremarkable.    Aorta and Major Vessels: Normal caliber abdominal aorta. Trace atherosclerotic plaque. Bowel, Mesentery and Peritoneal space: Short segment circumferential thickening with minimal adjacent stranding at the splenic flexure of the colon, likely corresponding to recently biopsied malignancy (series 3 image 111). Several subcentimeter adjacent pericolonic lymph nodes (for example series 3 image 118).    Large irregular intraluminal masslike soft tissue in the region of the rectosigmoid colon, likely corresponding to reported polyp on recent colonoscopy (series 3 image 192).    No bowel obstruction. Normal appendix. No mesenteric lymphadenopathy or ascites.    Pelvis: IUD in the fundal endometrial canal. Uterus and adnexa are otherwise within normal limits for age by CT. Unremarkable urinary bladder. No pelvic lymphadenopathy.    Abdominal wall  and Osseous Structures: Surgical clips along the ventral abdominal wall from breast flap reconstruction. No destructive osseous lesion.  Impression: CHEST:  1. Bilateral mastectomies with flap construction. No thoracic lymphadenopathy.    2. Unchanged indeterminate sub-5 mm nodular opacity in the right lower lobe since 01/05/2022. No new pulmonary mass or pleural effusion.      ABDOMEN AND PELVIS:  1. Short segment circumferential thickening with minimal adjacent stranding at the splenic flexure of the colon, likely corresponding to recently biopsied malignancy. Several subcentimeter adjacent pericolonic lymph nodes are indeterminate.    2. Large irregular intraluminal masslike soft tissue in the region of the rectosigmoid colon, likely corresponding to reported polyp on recent colonoscopy.    3. No abdominopelvic metastatic disease.    4. Persistent hepatomegaly with diffuse steatosis.     Finalized by Ancil Boozer, M.D. on 07/05/2022 9:44 AM. Dictated by Ancil Boozer, M.D. on 07/05/2022 9:23 AM.       Path:  PATHOLOGY REPORT   Date Value Ref Range Status   06/30/2022   Final    [facility]  [email or web address]    Department of Pathology and Laboratory Medicine  [address], [place], [place] [address]  Surgical Pathology Office:  [phone]  Fax:  [phone]  SURGICAL PATHOLOGY REPORT    NAME: DAVY, FAUGHT PATH #: [id number] MR #: [id number] SPECIMEN  CLASS: SR BILLING #: [id number] ALT ID #:  LOCATION: [id number] DATE OF  PROCEDURE: 06/30/2022 AGE:  46 SEX: F DATE RECEIVED: 07/01/2022 DOB: 1975/07/27  TIME RECEIVED:  07:56 PHYSICIAN: Argentina Ponder, MD DATE OF REPORT:  07/05/2022 COPY TO:  DATE OF PRINTING: 07/05/2022         ########################################################################  Final Diagnosis:    A. Right inferior lateral chest, biopsy:    -- Superficial mixed inflammation and scattered melanophages    Comment: The differential includes burned-out lichenoid keratosis and  post-inflammatory hyperpigmentation. MART-1 and PRAME stains are negative  for a melanocytic lesion. Some lymphocytes are present at the  dermoepidermal junction, but definitive hyalinization of the papillary  dermis is not appreciated and there is insufficient evidence to support  lichen sclerosus based on the findings of this biopsy.      Attestation:  By this signature, I attest that I have personally formulated the final  interpretation expressed in this report and that the above diagnosis is  based upon my examination of the slides and/or other material indicated in  this report.    +++Electronically Signed Out By Kellie Simmering, MD on 07/05/2022+++             hd/07/05/2022             ########################################################################  Material Received:  A: Right inferior lateral chest, biopsy    History:  A.  Rule out atypical nevus versus PIH versus extragenital LSNA        Gross Description:  A. Received in NBF labeled right inferior lateral chest is a 1.0 x 0.7 x  0.1 cm shave, trisected, all in A1.  (cg)    cf/07/01/2022           If immunohistochemical stains and/or in situ hybridization are cited in  this report, the performance characteristics were determined by the  Department of Pathology and Laboratory Medicine of the [facility]  [facility]) in compliance with CLIA'88  regulations. Some of these tests rely on the use of analyte specific  reagents and are subject to specific  labeling requirements by the FDA.  The stains are performed on formalin-fixed, paraffin-embedded tissue,  unless otherwise stated. Known positive and negative control tissues  demonstrate appropriate staining. Results should be interpreted with  caution given the likelihood of false negativity on decalcified specimens.  This testing was developed by the Department of Pathology and Laboratory  Medicine of the [facility] [place]. It has not been cleared or approved  by the FDA.  The FDA has determined that such clearance or approval is not  necessary.    Testing performed by the [name] at [address] [place], [place] [address]. CLIA # [id number].       Assessment and Plan:   Jennifer Durham is a 47 y.o. 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  pT1c pN1a.  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  She is s/p bilateral skin sparing mastectomy with temporary expander placement, right sentinel lymph node biopsy, axillary lymph node dissection and 1 of 34 lymph nodes positive for a 5 mm metastatic deposit, no LVI, no ENE, no EIC.  She underwent adjuvant chemotherapy with TC, completed 4 cycles in 11/2019. She enrolled in MA.39 (FAO#[id number]) [employer] [facility], and was randomized to the treatment arm. She underwent adjuvant radiotherapy to her right reconstructed breast, chest wall and regional nodes to a dose of 4526 cGy in 16 fractions from 02/19/2020 - 03/11/2020. She is now s/p DIEP flap reconstruction of the bilateral breasts in 06/2020.     - Jennifer Durham was seen today for routine follow-up after completion of radiation therapy.  At this time, she is clinically stable with no clinical evidence of disease recurrence or progression.  She has no subacute toxicities from having undergone adjuvant radiation, aside from mild limitation in right shoulder range of motion.   - found to have moderately differentiated adenocarcinoma on colonoscopy at the splenic flexure with another large rectosigmoid polyp (TVA). No metastatic disease on imaging. CEA WNL at 0.8. Planned for surgical resection on 07/23/2022.    - Our plan will be for the patient to return to our clinic in 6 months for continued follow-up.  She may contact us in the interim if there are any questions or concerns requiring earlier assessment.  -The patient will keep their appointments with their other managing providers including medical oncology, Dr. Welton Flakes, as well as with breast surgery. She continues on tamoxifen.          Total time for today's visit was 24 minutes. Visit time spent on the following: preparing to see the patient, obtaining and/or reviewing separately obtained history/information, performing a physical examination and/or evaluation, counseling and educating the patient/family/caregiver, referring to and communication with other health care professionals, documenting clinical information in the electronic or other health record and care coordination.     Jennifer Durham AGNP-C  [facility] - Radiation Oncology  [address]  [place], [place] [address]    Collaborating Physician: Elby Showers, MD    Parts of this note were created using voice recognition software.  Please excuse any grammatical or typographical errors.

## 2022-07-21 ENCOUNTER — Encounter: Admit: 2022-07-21 | Discharge: 2022-07-21 | Payer: BC Managed Care – PPO

## 2022-07-21 ENCOUNTER — Ambulatory Visit: Admit: 2022-07-21 | Discharge: 2022-07-21 | Payer: BC Managed Care – PPO

## 2022-07-21 DIAGNOSIS — Z09 Encounter for follow-up examination after completed treatment for conditions other than malignant neoplasm: Secondary | ICD-10-CM

## 2022-07-21 DIAGNOSIS — E282 Polycystic ovarian syndrome: Secondary | ICD-10-CM

## 2022-07-21 DIAGNOSIS — R12 Heartburn: Secondary | ICD-10-CM

## 2022-07-21 DIAGNOSIS — Z923 Personal history of irradiation: Secondary | ICD-10-CM

## 2022-07-21 DIAGNOSIS — I10 Essential (primary) hypertension: Secondary | ICD-10-CM

## 2022-07-21 DIAGNOSIS — E88819 Insulin resistance: Secondary | ICD-10-CM

## 2022-07-21 DIAGNOSIS — T7840XA Allergy, unspecified, initial encounter: Secondary | ICD-10-CM

## 2022-07-21 DIAGNOSIS — F32A Depression: Secondary | ICD-10-CM

## 2022-07-21 DIAGNOSIS — J302 Other seasonal allergic rhinitis: Secondary | ICD-10-CM

## 2022-07-21 DIAGNOSIS — F419 Anxiety disorder, unspecified: Secondary | ICD-10-CM

## 2022-07-21 DIAGNOSIS — R000 Tachycardia, unspecified: Secondary | ICD-10-CM

## 2022-07-21 DIAGNOSIS — Z789 Other specified health status: Secondary | ICD-10-CM

## 2022-07-21 DIAGNOSIS — E669 Obesity, unspecified: Secondary | ICD-10-CM

## 2022-07-21 DIAGNOSIS — E785 Hyperlipidemia, unspecified: Secondary | ICD-10-CM

## 2022-07-21 DIAGNOSIS — C189 Malignant neoplasm of colon, unspecified: Secondary | ICD-10-CM

## 2022-07-21 DIAGNOSIS — F3289 Other specified depressive episodes: Secondary | ICD-10-CM

## 2022-07-21 DIAGNOSIS — C50411 Malignant neoplasm of upper-outer quadrant of right female breast: Secondary | ICD-10-CM

## 2022-07-21 DIAGNOSIS — E039 Hypothyroidism, unspecified: Secondary | ICD-10-CM

## 2022-07-21 DIAGNOSIS — C50919 Malignant neoplasm of unspecified site of unspecified female breast: Secondary | ICD-10-CM

## 2022-07-23 ENCOUNTER — Inpatient Hospital Stay: Admit: 2022-07-23 | Discharge: 2022-07-23 | Payer: BC Managed Care – PPO

## 2022-07-23 ENCOUNTER — Encounter: Admit: 2022-07-23 | Discharge: 2022-07-23 | Payer: BC Managed Care – PPO

## 2022-07-23 DIAGNOSIS — C189 Malignant neoplasm of colon, unspecified: Secondary | ICD-10-CM

## 2022-07-23 DIAGNOSIS — R000 Tachycardia, unspecified: Secondary | ICD-10-CM

## 2022-07-23 DIAGNOSIS — F32A Depression: Secondary | ICD-10-CM

## 2022-07-23 DIAGNOSIS — J302 Other seasonal allergic rhinitis: Secondary | ICD-10-CM

## 2022-07-23 DIAGNOSIS — Z789 Other specified health status: Secondary | ICD-10-CM

## 2022-07-23 DIAGNOSIS — E785 Hyperlipidemia, unspecified: Secondary | ICD-10-CM

## 2022-07-23 DIAGNOSIS — E039 Hypothyroidism, unspecified: Secondary | ICD-10-CM

## 2022-07-23 DIAGNOSIS — E669 Obesity, unspecified: Secondary | ICD-10-CM

## 2022-07-23 DIAGNOSIS — F3289 Other specified depressive episodes: Secondary | ICD-10-CM

## 2022-07-23 DIAGNOSIS — R12 Heartburn: Secondary | ICD-10-CM

## 2022-07-23 DIAGNOSIS — I10 Essential (primary) hypertension: Secondary | ICD-10-CM

## 2022-07-23 DIAGNOSIS — E282 Polycystic ovarian syndrome: Secondary | ICD-10-CM

## 2022-07-23 DIAGNOSIS — E88819 Insulin resistance: Secondary | ICD-10-CM

## 2022-07-23 DIAGNOSIS — C50919 Malignant neoplasm of unspecified site of unspecified female breast: Secondary | ICD-10-CM

## 2022-07-23 DIAGNOSIS — T7840XA Allergy, unspecified, initial encounter: Secondary | ICD-10-CM

## 2022-07-23 DIAGNOSIS — F419 Anxiety disorder, unspecified: Secondary | ICD-10-CM

## 2022-07-23 DIAGNOSIS — Z923 Personal history of irradiation: Secondary | ICD-10-CM

## 2022-07-23 MED ORDER — SUCCINYLCHOLINE CHLORIDE 20 MG/ML IJ SOLN
INTRAVENOUS | 0 refills | Status: DC
Start: 2022-07-23 — End: 2022-07-23

## 2022-07-23 MED ORDER — SUGAMMADEX 100 MG/ML IV SOLN
INTRAVENOUS | 0 refills | Status: DC
Start: 2022-07-23 — End: 2022-07-23

## 2022-07-23 MED ORDER — PHENYLEPHRINE 40 MCG/ML IN NS IV DRIP (STD CONC)
INTRAVENOUS | 0 refills | Status: DC
Start: 2022-07-23 — End: 2022-07-23
  Administered 2022-07-23 (×2): .2 ug/kg/min via INTRAVENOUS

## 2022-07-23 MED ORDER — PROPOFOL 10 MG/ML IV EMUL 100 ML (INFUSION)(AM)(OR)
INTRAVENOUS | 0 refills | Status: DC
Start: 2022-07-23 — End: 2022-07-23
  Administered 2022-07-23: 13:00:00 150 ug/kg/min via INTRAVENOUS

## 2022-07-23 MED ORDER — ALBUMIN, HUMAN 5 % 250 ML IV SOLP (AN)(OSM)
INTRAVENOUS | 0 refills | Status: DC
Start: 2022-07-23 — End: 2022-07-23

## 2022-07-23 MED ORDER — LABETALOL 5 MG/ML IV SYRG
INTRAVENOUS | 0 refills | Status: DC
Start: 2022-07-23 — End: 2022-07-23

## 2022-07-23 MED ORDER — ELECTROLYTE-A IV SOLP
INTRAVENOUS | 0 refills | Status: DC
Start: 2022-07-23 — End: 2022-07-23

## 2022-07-23 MED ORDER — ACETAMINOPHEN 1,000 MG/100 ML (10 MG/ML) IV SOLN
INTRAVENOUS | 0 refills | Status: DC
Start: 2022-07-23 — End: 2022-07-23

## 2022-07-23 MED ORDER — HYDROMORPHONE (PF) 2 MG/ML IJ SYRG
INTRAVENOUS | 0 refills | Status: DC
Start: 2022-07-23 — End: 2022-07-23

## 2022-07-23 MED ORDER — PHENYLEPHRINE HCL IN 0.9% NACL 1 MG/10 ML (100 MCG/ML) IV SYRG
INTRAVENOUS | 0 refills | Status: DC
Start: 2022-07-23 — End: 2022-07-23

## 2022-07-23 MED ORDER — ROCURONIUM 10 MG/ML IV SOLN
INTRAVENOUS | 0 refills | Status: DC
Start: 2022-07-23 — End: 2022-07-23

## 2022-07-23 MED ORDER — INDOCYANINE GREEN 25 MG IJ SOLR
INTRAVENOUS | 0 refills | Status: DC
Start: 2022-07-23 — End: 2022-07-23

## 2022-07-23 MED ORDER — DEXMEDETOMIDINE IN 0.9 % NACL 20 MCG/5 ML (4 MCG/ML) IV SYRG
INTRAVENOUS | 0 refills | Status: DC
Start: 2022-07-23 — End: 2022-07-23

## 2022-07-23 MED ORDER — KETAMINE 10 MG/ML IJ SOLN
INTRAVENOUS | 0 refills | Status: DC
Start: 2022-07-23 — End: 2022-07-23

## 2022-07-23 MED ORDER — PROPOFOL INJ 10 MG/ML IV VIAL
INTRAVENOUS | 0 refills | Status: DC
Start: 2022-07-23 — End: 2022-07-23

## 2022-07-23 MED ORDER — ARTIFICIAL TEARS (PF) SINGLE DOSE DROPS GROUP
OPHTHALMIC | 0 refills | Status: DC
Start: 2022-07-23 — End: 2022-07-23

## 2022-07-23 MED ORDER — LIDOCAINE (PF) 200 MG/10 ML (2 %) IJ SYRG
INTRAVENOUS | 0 refills | Status: DC
Start: 2022-07-23 — End: 2022-07-23

## 2022-07-23 MED ORDER — MIDAZOLAM 1 MG/ML IJ SOLN
INTRAVENOUS | 0 refills | Status: DC
Start: 2022-07-23 — End: 2022-07-23

## 2022-07-23 MED ORDER — DEXAMETHASONE SODIUM PHOSPHATE 4 MG/ML IJ SOLN
INTRAVENOUS | 0 refills | Status: DC
Start: 2022-07-23 — End: 2022-07-23

## 2022-07-23 MED ORDER — FENTANYL CITRATE (PF) 50 MCG/ML IJ SOLN
INTRAVENOUS | 0 refills | Status: DC
Start: 2022-07-23 — End: 2022-07-23

## 2022-07-23 MED ORDER — LACTATED RINGERS IV SOLP
INTRAVENOUS | 0 refills | Status: DC
Start: 2022-07-23 — End: 2022-07-23

## 2022-07-23 MED ORDER — EPINEPHRINE 0.1MG NS 10ML SYR (AM)(OR)
INTRAVENOUS | 0 refills | Status: DC
Start: 2022-07-23 — End: 2022-07-23
  Administered 2022-07-23 (×2): 10 ug via INTRAVENOUS

## 2022-07-23 MED ADMIN — GABAPENTIN 300 MG PO CAP [18308]: 600 mg | ORAL | @ 12:00:00 | Stop: 2022-07-23 | NDC 00904666661

## 2022-07-23 MED ADMIN — CEFAZOLIN 3 GRAM IV SOLR [462610]: 3 g | INTRAVENOUS | @ 13:00:00 | Stop: 2022-07-23 | NDC 00143914001

## 2022-07-23 MED ADMIN — LACTATED RINGERS IV SOLP [4318]: 1000.000 mL | INTRAVENOUS | @ 21:00:00 | Stop: 2022-07-24 | NDC 00338011704

## 2022-07-23 MED ADMIN — ACETAMINOPHEN 500 MG PO TAB [102]: 1000 mg | ORAL | @ 12:00:00 | Stop: 2022-07-23 | NDC 00904673061

## 2022-07-23 MED ADMIN — PIPERACILLIN-TAZOBACTAM 4.5 GRAM IV SOLR [80419]: 4.5 g | INTRAVENOUS | NDC 60505615900

## 2022-07-23 MED ADMIN — SODIUM CHLORIDE 0.9 % IV PGBK (MB+) [95161]: 4.5 g | INTRAVENOUS | NDC 00338915930

## 2022-07-23 MED ADMIN — SODIUM CHLORIDE 0.9 % IV SOLP [27838]: 250 mL | INTRAVENOUS | Stop: 2022-07-23 | NDC 00338004902

## 2022-07-23 MED ADMIN — METRONIDAZOLE IN NACL (ISO-OS) 500 MG/100 ML IV PGBK [5018]: 500 mg | INTRAVENOUS | @ 12:00:00 | Stop: 2022-07-23 | NDC 00409015201

## 2022-07-23 MED ADMIN — CELECOXIB 200 MG PO CAP [76958]: 200 mg | ORAL | @ 12:00:00 | Stop: 2022-07-23 | NDC 00904650361

## 2022-07-23 MED ADMIN — GABAPENTIN 300 MG PO CAP [18308]: 300 mg | ORAL | Stop: 2022-07-26 | NDC 00904666661

## 2022-07-23 MED ADMIN — NALOXEGOL 12.5 MG PO TAB [324272]: 25 mg | ORAL | @ 12:00:00 | Stop: 2022-07-23 | NDC 82625880101

## 2022-07-23 MED ADMIN — CARVEDILOL 6.25 MG PO TAB [77309]: 6.25 mg | ORAL | NDC 00904630161

## 2022-07-23 MED ADMIN — CEFAZOLIN 3 GRAM IV SOLR [462610]: 3 g | INTRAVENOUS | @ 17:00:00 | Stop: 2022-07-23 | NDC 00143914001

## 2022-07-23 MED ADMIN — SODIUM CHLORIDE 0.9 % IV SOLP [27838]: 250 mL | INTRAVENOUS | @ 12:00:00 | Stop: 2022-07-23 | NDC 00338004902

## 2022-07-23 MED ADMIN — TRAMADOL 50 MG PO TAB [14632]: 50 mg | ORAL | NDC 00904717961

## 2022-07-23 NOTE — Anesthesia Post-Procedure Evaluation
Post-Anesthesia Evaluation    Name: Jennifer Durham      MRN: [id number]     DOB: [birth year]     Age: 47 y.o.     Sex: female   __________________________________________________________________________     Procedure Information       Anesthesia Start Date/Time: 07/23/22 0739    Procedures:       ROBOT ASSISTED LEFT COLECTOMY, CONVERTED TO OPEN (Abdomen) - 210 mins      ROBOT ASSISTED SURGERY (Abdomen)    Location: [facility] / [facility]/Periop    Surgeons: Benetta Spar, MD            Post-Anesthesia Vitals  BP: 124/80 (06/28 1630)  Temp: 36.8 ?C (98.3 ?F) (06/28 1630)  Pulse: 91 (06/28 1630)  Respirations: 14 PER MINUTE (06/28 1630)  SpO2: 98 % (06/28 1630)  O2 Device: Nasal cannula (06/28 1630)   Vitals Value Taken Time   BP 124/80 07/23/22 1630   Temp 36.8 ?C (98.3 ?F) 07/23/22 1630   Pulse 91 07/23/22 1630   Respirations 14 PER MINUTE 07/23/22 1630   SpO2 98 % 07/23/22 1630   O2 Device Nasal cannula 07/23/22 1630   ABP     ART BP           Post Anesthesia Evaluation Note    Evaluation location: Pre/Post  Patient participation: recovered; patient participated in evaluation  Level of consciousness: alert and sleepy but conscious    Pain score: 0  Pain management: adequate    Hydration: normovolemia  Temperature: 36.0?C - 38.4?C  Airway patency: adequate    Perioperative Events       Post-op nausea and vomiting: no PONV    Postoperative Status  Cardiovascular status: hemodynamically stable  Respiratory status: spontaneous ventilation and supplemental oxygen (2 lpm O2 via NC)  Follow-up needed: none  Additional comments: Patient found resting comfortably in bed.  Patient in no acute distress and all questions were answered.  Patient safe for discharge to floor from PACU.            Perioperative Events  There were no known complications for this encounter.     Post-Anesthesia Evaluation Attestation: I reviewed and agree the indicated post-anesthesia care was provided. I have reviewed key portions of the indicated post anesthesia care. I have examined the patient's vitals, physical status, and complications and agree with what is documented.    Staff name:  Terrence Dupont, MD Date:  07/23/2022

## 2022-07-23 NOTE — Anesthesia Pre-Procedure Evaluation
Anesthesia Pre-Procedure Evaluation    Name: Jennifer Durham      MRN: [id number]     DOB: 02-03-1975     Age: 47 y.o.     Sex: female   _________________________________________________________________________     Procedure Info:   Procedure Information       Date/Time: 07/23/22 0730    Procedures:       ROBOT ASSISTED LEFT COLECTOMY - 210 mins      ROBOT ASSISTED SURGERY    Location: [facility]    Surgeons: Benetta Spar, MD            Physical Assessment  Vital Signs (last filed in past 24 hours):  BP: 121/79 (06/28 0627)  Temp: 36.4 ?C (97.5 ?F) (06/28 0617)  Pulse: 89 (06/28 0627)  Respirations: 15 PER MINUTE (06/28 0627)  SpO2: 97 % (06/28 0627)  O2 Device: None (Room air) (06/28 0627)  Height: 182.9 cm (6') (06/28 0617)  Weight: 136.1 kg (300 lb 2 oz) (06/28 0617)      Patient History   Allergies   Allergen Reactions    Tegaderm RASH        Current Medications    Medication Directions   carvediloL (COREG) 6.25 mg tablet Take one tablet by mouth twice daily with meals. Take with food.   CHOLEcalciferoL (vitamin D3) (OPTIMAL D3) 50,000 units capsule sundays   ciclopirox (PENLAC) 8 % topical solution Apply topically to entire affected nail(s) once daily.  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   levothyroxine (SYNTHROID) 50 mcg tablet Take one tablet by mouth daily.   losartan (COZAAR) 50 mg tablet Take  by mouth.   metFORMIN (GLUCOPHAGE) 1,000 mg tablet Take one tablet by mouth daily with breakfast.   metroNIDAZOLE (FLAGYL) 500 mg tablet Please take at the following times:   1pm: Take 2 tablets of Flagyl (500mg  tablets)   3pm: Take 2 tablets of Flagyl (500mg  tablets)   11pm: Take 2 tablets of Flagyl (500mg  tablets)  *Please try to take all the antibiotics.  Indications: ERAS pre-surgical preparation   montelukast (SINGULAIR) 10 mg tablet Take one tablet by mouth at bedtime daily.   MOUNJARO 5 mg/0.5 mL injector EN    neomycin (MYCIFRADIN) 500 mg tablet Please take at the following times:   1pm: Take 2 tablets of Neomycin (500mg  tablets)    3pm: Take 2 tablets of Neomycin (500mg  tablets)    11pm: Take 2 tablets of Neomycin (500mg  tablets)   *Please try to take all the antibiotics.  Indications: ERAS pre-surgical preparation   rosuvastatin (CRESTOR) 5 mg tablet Take  by mouth.   tamoxifen (NOLVADEX) 20 mg tablet TAKE ONE TABLET BY MOUTH EVERY NIGHT AT BEDTIME       Review of Systems/Medical History      Patient summary reviewed  Nursing notes reviewed  Pertinent labs reviewed  Difficult IV access    PONV Screening: Non-smoker, Female sex and Postoperative opioids    No history of anesthetic complications    No family history of anesthetic complications      Airway - negative        Pulmonary - negative           Obstructive Sleep Apnea: snoring.      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  A1c 6.2 on 03/12/22.        Hypothyroidism        Malignancy (right breast cancer- s/p XRT, chemotx, mastectomy):    treated, chemotherapy and radiation      Obesity (BMI 43): Class 3 (BMI 40-49.9)        PCOS      Constitution - negative       Physical Exam    Airway Findings      Mallampati: II      TM distance: >3 FB      Neck ROM: full      Mouth opening: good      Airway patency: adequate    Dental Findings: Negative      Cardiovascular Findings:       Rhythm: regular      Rate: normal    Pulmonary Findings:       Breath sounds clear to auscultation. Decreased breath sounds.    Abdominal Findings:       Obese    Neurological Findings:       Alert and oriented x 3      Anxious    Constitutional findings:       No acute distress      Well-developed       Diagnostic Tests  Hematology:   Lab Results   Component Value Date    HGB 11.0 07/03/2022    HCT 33.5 07/03/2022    PLTCT 262 07/03/2022    WBC 6.2 07/03/2022    NEUT 66 07/03/2022    ANC 4.10 07/03/2022    ALC 1.31 07/03/2022    MONA 7 07/03/2022    AMC 0.40 07/03/2022    EOSA 5 07/03/2022    ABC 0.04 07/03/2022    MCV 81.0 07/03/2022    MCH 26.6 07/03/2022    MCHC 32.8 07/03/2022    MPV 7.7 07/03/2022    RDW 16.3 07/03/2022         General Chemistry:   Lab Results   Component Value Date    NA 142 07/03/2022    K 4.1 07/03/2022    CL 105 07/03/2022    CO2 27 07/03/2022    GAP 10 07/03/2022    BUN 12 07/03/2022    CR 1.3 07/03/2022    CR 1.18 07/03/2022    GLU 99 07/03/2022    CA 8.9 07/03/2022    ALBUMIN 3.9 07/03/2022    TOTBILI 0.4 07/03/2022      Coagulation: No results found for:  PT, PTT, INR    PAC Plan    Interview: Phone Screen Interview                                  Patient denies changes to health history since surgery on 06/23/22      Alerts: Yes                  Gastroparesis          Anesthesia Plan    ASA score: 3   Plan: general, regional for postoperative pain and epidural for post operative pain  NPO status: acceptable      Informed Consent  Anesthetic plan and risks discussed with patient and spouse.  Use of blood products discussed with patient  Blood Consent: consented      Plan discussed with: anesthesiologist.

## 2022-07-23 NOTE — Anesthesia Procedure Notes
Procedure: Airway Placement    AIRWAY INSERTION    Date/Time: 07/23/2022 7:50 AM    Patient location: OR  Urgency: elective  Difficult Airway: No            Airway Procedure  Indication(s) for airway management: surgery        Rapid Sequence Induction (RSI) used: yes    Preoxygenated: yes  Patient position: sniffing  Neck stabilization: in-line stabilization    Mask difficulty assessment: 0 - not attempted      Procedure Outcome  Final airway type: endotracheal airway  Endotracheal airway: ETT          ETT size (mm): 7.0  Technique used for successful ETT placement: video laryngoscopy  Devices/methods used in placement: intubating stylet  Insertion site: oral  Blade type: Glide Scope   Laryngoscope/Videolaryngoscope blade size: 3  Cormack-Lehane classification: grade I - full view of glottis      Measured from: teeth   Depth: 22 cm  Amount of Air in Cuff: 8 ml  Number of attempts at approach: 1  Placement verified by auscultation and capnometry            Complications  Cardiovascular:   Pulmonary:   Procedure: airway not difficult  Medication:   Additional notes: Atraumatic, no change to dentition or mucosa.      Performed by: Dimple Casey, CRNA  Authorized by: Jackey Loge, MD

## 2022-07-24 MED ADMIN — PIPERACILLIN-TAZOBACTAM 4.5 GRAM IV SOLR [80419]: 4.5 g | INTRAVENOUS | @ 11:00:00 | NDC 60505615900

## 2022-07-24 MED ADMIN — CELECOXIB 100 MG PO CAP [82263]: 200 mg | ORAL | @ 13:00:00 | Stop: 2022-07-27 | NDC 00904650261

## 2022-07-24 MED ADMIN — NALOXEGOL 12.5 MG PO TAB [324272]: 25 mg | ORAL | @ 11:00:00 | NDC 82625880101

## 2022-07-24 MED ADMIN — LACTATED RINGERS IV SOLP [4318]: 1000.000 mL | INTRAVENOUS | @ 05:00:00 | Stop: 2022-07-24 | NDC 00338011704

## 2022-07-24 MED ADMIN — DEXTROSE 5%-0.45% SODIUM CHLORIDE & POTASSIUM CHLORIDE 20 MEQ/L IV SOLP [14863]: 1000.000 mL | INTRAVENOUS | @ 19:00:00 | Stop: 2022-07-26 | NDC 00338067104

## 2022-07-24 MED ADMIN — SODIUM CHLORIDE 0.9 % IV PGBK (MB+) [95161]: 4.5 g | INTRAVENOUS | @ 22:00:00 | NDC 00338915930

## 2022-07-24 MED ADMIN — GABAPENTIN 300 MG PO CAP [18308]: 300 mg | ORAL | @ 13:00:00 | Stop: 2022-07-26 | NDC 00904666661

## 2022-07-24 MED ADMIN — ROSUVASTATIN 20 MG PO TAB [88504]: 20 mg | ORAL | @ 13:00:00 | NDC 68462026390

## 2022-07-24 MED ADMIN — PIPERACILLIN-TAZOBACTAM 4.5 GRAM IV SOLR [80419]: 4.5 g | INTRAVENOUS | @ 22:00:00 | NDC 60505615900

## 2022-07-24 MED ADMIN — PIPERACILLIN-TAZOBACTAM 4.5 GRAM IV SOLR [80419]: 4.5 g | INTRAVENOUS | @ 05:00:00 | NDC 60505615900

## 2022-07-24 MED ADMIN — CELECOXIB 100 MG PO CAP [82263]: 200 mg | ORAL | @ 02:00:00 | Stop: 2022-07-27 | NDC 00904650261

## 2022-07-24 MED ADMIN — SODIUM CHLORIDE 0.9 % IV PGBK (MB+) [95161]: 4.5 g | INTRAVENOUS | @ 11:00:00 | NDC 00338915930

## 2022-07-24 MED ADMIN — LEVOTHYROXINE 50 MCG PO TAB [4421]: 50 ug | ORAL | @ 11:00:00 | NDC 00904695061

## 2022-07-24 MED ADMIN — ACETAMINOPHEN 500 MG PO TAB [102]: 1000 mg | ORAL | @ 02:00:00 | Stop: 2022-07-27 | NDC 00904673061

## 2022-07-24 MED ADMIN — SODIUM CHLORIDE 0.9 % IV PGBK (MB+) [95161]: 4.5 g | INTRAVENOUS | @ 15:00:00 | NDC 00338915930

## 2022-07-24 MED ADMIN — TRAMADOL 50 MG PO TAB [14632]: 50 mg | ORAL | @ 19:00:00 | NDC 00904717961

## 2022-07-24 MED ADMIN — CARVEDILOL 6.25 MG PO TAB [77309]: 6.25 mg | ORAL | @ 22:00:00 | NDC 00904630161

## 2022-07-24 MED ADMIN — ENOXAPARIN 40 MG/0.4 ML SC SYRG [85052]: 40 mg | SUBCUTANEOUS | @ 13:00:00 | NDC 00781324602

## 2022-07-24 MED ADMIN — GABAPENTIN 300 MG PO CAP [18308]: 300 mg | ORAL | @ 19:00:00 | Stop: 2022-07-26 | NDC 00904666661

## 2022-07-24 MED ADMIN — ACETAMINOPHEN 500 MG PO TAB [102]: 1000 mg | ORAL | @ 11:00:00 | Stop: 2022-07-27 | NDC 00904673061

## 2022-07-24 MED ADMIN — POTASSIUM CHLORIDE 20 MEQ PO TBTQ [35943]: 40 meq | ORAL | @ 15:00:00 | Stop: 2022-07-24 | NDC 00832532510

## 2022-07-24 MED ADMIN — TRAMADOL 50 MG PO TAB [14632]: 50 mg | ORAL | @ 05:00:00 | NDC 00904717961

## 2022-07-24 MED ADMIN — TRAMADOL 50 MG PO TAB [14632]: 50 mg | ORAL | @ 11:00:00 | NDC 00904717961

## 2022-07-24 MED ADMIN — CARVEDILOL 6.25 MG PO TAB [77309]: 6.25 mg | ORAL | @ 13:00:00 | NDC 00904630161

## 2022-07-24 MED ADMIN — ESCITALOPRAM OXALATE 10 MG PO TAB [86689]: 10 mg | ORAL | @ 13:00:00 | NDC 00904642661

## 2022-07-24 MED ADMIN — INSULIN ASPART 100 UNIT/ML SC FLEXPEN [87504]: 2 [IU] | SUBCUTANEOUS | @ 13:00:00 | NDC 00169633910

## 2022-07-24 MED ADMIN — PIPERACILLIN-TAZOBACTAM 4.5 GRAM IV SOLR [80419]: 4.5 g | INTRAVENOUS | @ 15:00:00 | NDC 60505615900

## 2022-07-24 MED ADMIN — ACETAMINOPHEN 500 MG PO TAB [102]: 1000 mg | ORAL | @ 19:00:00 | Stop: 2022-07-27 | NDC 00904673061

## 2022-07-24 MED ADMIN — SODIUM CHLORIDE 0.9 % IV PGBK (MB+) [95161]: 4.5 g | INTRAVENOUS | @ 05:00:00 | NDC 00338915930

## 2022-07-25 ENCOUNTER — Encounter: Admit: 2022-07-25 | Discharge: 2022-07-25 | Payer: BC Managed Care – PPO

## 2022-07-25 DIAGNOSIS — E88819 Insulin resistance: Secondary | ICD-10-CM

## 2022-07-25 DIAGNOSIS — C50919 Malignant neoplasm of unspecified site of unspecified female breast: Secondary | ICD-10-CM

## 2022-07-25 DIAGNOSIS — F3289 Other specified depressive episodes: Secondary | ICD-10-CM

## 2022-07-25 DIAGNOSIS — E669 Obesity, unspecified: Secondary | ICD-10-CM

## 2022-07-25 DIAGNOSIS — J302 Other seasonal allergic rhinitis: Secondary | ICD-10-CM

## 2022-07-25 DIAGNOSIS — F419 Anxiety disorder, unspecified: Secondary | ICD-10-CM

## 2022-07-25 DIAGNOSIS — F32A Depression: Secondary | ICD-10-CM

## 2022-07-25 DIAGNOSIS — C189 Malignant neoplasm of colon, unspecified: Secondary | ICD-10-CM

## 2022-07-25 DIAGNOSIS — R000 Tachycardia, unspecified: Secondary | ICD-10-CM

## 2022-07-25 DIAGNOSIS — I10 Essential (primary) hypertension: Secondary | ICD-10-CM

## 2022-07-25 DIAGNOSIS — E785 Hyperlipidemia, unspecified: Secondary | ICD-10-CM

## 2022-07-25 DIAGNOSIS — Z789 Other specified health status: Secondary | ICD-10-CM

## 2022-07-25 DIAGNOSIS — T7840XA Allergy, unspecified, initial encounter: Secondary | ICD-10-CM

## 2022-07-25 DIAGNOSIS — Z923 Personal history of irradiation: Secondary | ICD-10-CM

## 2022-07-25 DIAGNOSIS — R12 Heartburn: Secondary | ICD-10-CM

## 2022-07-25 DIAGNOSIS — E039 Hypothyroidism, unspecified: Secondary | ICD-10-CM

## 2022-07-25 DIAGNOSIS — E282 Polycystic ovarian syndrome: Secondary | ICD-10-CM

## 2022-07-25 MED ADMIN — SODIUM CHLORIDE 0.9 % IV PGBK (MB+) [95161]: 4.5 g | INTRAVENOUS | @ 17:00:00 | NDC 00338915930

## 2022-07-25 MED ADMIN — ENOXAPARIN 40 MG/0.4 ML SC SYRG [85052]: 40 mg | SUBCUTANEOUS | @ 03:00:00 | NDC 00781324602

## 2022-07-25 MED ADMIN — CARVEDILOL 6.25 MG PO TAB [77309]: 6.25 mg | ORAL | @ 13:00:00 | NDC 00904630161

## 2022-07-25 MED ADMIN — SODIUM CHLORIDE 0.9 % IV PGBK (MB+) [95161]: 4.5 g | INTRAVENOUS | @ 11:00:00 | NDC 00338915930

## 2022-07-25 MED ADMIN — CELECOXIB 100 MG PO CAP [82263]: 200 mg | ORAL | @ 03:00:00 | Stop: 2022-07-27 | NDC 00904650261

## 2022-07-25 MED ADMIN — GABAPENTIN 300 MG PO CAP [18308]: 300 mg | ORAL | @ 03:00:00 | Stop: 2022-07-26 | NDC 00904666661

## 2022-07-25 MED ADMIN — GABAPENTIN 300 MG PO CAP [18308]: 300 mg | ORAL | @ 13:00:00 | Stop: 2022-07-26 | NDC 00904666661

## 2022-07-25 MED ADMIN — PIPERACILLIN-TAZOBACTAM 4.5 GRAM IV SOLR [80419]: 4.5 g | INTRAVENOUS | @ 17:00:00 | NDC 60505615900

## 2022-07-25 MED ADMIN — ACETAMINOPHEN 500 MG PO TAB [102]: 1000 mg | ORAL | @ 03:00:00 | Stop: 2022-07-27 | NDC 00904673061

## 2022-07-25 MED ADMIN — ACETAMINOPHEN 500 MG PO TAB [102]: 1000 mg | ORAL | @ 19:00:00 | Stop: 2022-07-27 | NDC 00904673061

## 2022-07-25 MED ADMIN — ACETAMINOPHEN 500 MG PO TAB [102]: 1000 mg | ORAL | @ 11:00:00 | Stop: 2022-07-27 | NDC 00904673061

## 2022-07-25 MED ADMIN — LEVOTHYROXINE 50 MCG PO TAB [4421]: 50 ug | ORAL | @ 11:00:00 | NDC 00904695061

## 2022-07-25 MED ADMIN — CARVEDILOL 6.25 MG PO TAB [77309]: 6.25 mg | ORAL | @ 23:00:00 | NDC 00904630161

## 2022-07-25 MED ADMIN — PIPERACILLIN-TAZOBACTAM 4.5 GRAM IV SOLR [80419]: 4.5 g | INTRAVENOUS | @ 23:00:00 | NDC 60505615900

## 2022-07-25 MED ADMIN — PIPERACILLIN-TAZOBACTAM 4.5 GRAM IV SOLR [80419]: 4.5 g | INTRAVENOUS | @ 11:00:00 | NDC 60505615900

## 2022-07-25 MED ADMIN — ROSUVASTATIN 20 MG PO TAB [88504]: 20 mg | ORAL | @ 13:00:00 | NDC 68462026390

## 2022-07-25 MED ADMIN — GABAPENTIN 300 MG PO CAP [18308]: 300 mg | ORAL | @ 19:00:00 | Stop: 2022-07-26 | NDC 00904666661

## 2022-07-25 MED ADMIN — ENOXAPARIN 40 MG/0.4 ML SC SYRG [85052]: 40 mg | SUBCUTANEOUS | @ 13:00:00 | NDC 00781324602

## 2022-07-25 MED ADMIN — SODIUM CHLORIDE 0.9 % IV PGBK (MB+) [95161]: 4.5 g | INTRAVENOUS | @ 04:00:00 | NDC 00338915930

## 2022-07-25 MED ADMIN — ESCITALOPRAM OXALATE 10 MG PO TAB [86689]: 10 mg | ORAL | @ 13:00:00 | NDC 00904642661

## 2022-07-25 MED ADMIN — SODIUM CHLORIDE 0.9 % IV PGBK (MB+) [95161]: 4.5 g | INTRAVENOUS | @ 23:00:00 | NDC 00338915930

## 2022-07-25 MED ADMIN — PIPERACILLIN-TAZOBACTAM 4.5 GRAM IV SOLR [80419]: 4.5 g | INTRAVENOUS | @ 04:00:00 | NDC 60505615900

## 2022-07-25 MED ADMIN — DIPHENHYDRAMINE HCL 25 MG PO CAP [2509]: 25 mg | ORAL | @ 13:00:00 | Stop: 2022-07-25 | NDC 00904723761

## 2022-07-26 ENCOUNTER — Encounter: Admit: 2022-07-26 | Discharge: 2022-07-26 | Payer: BC Managed Care – PPO

## 2022-07-26 MED ADMIN — GABAPENTIN 300 MG PO CAP [18308]: 300 mg | ORAL | @ 02:00:00 | Stop: 2022-07-26 | NDC 00904666661

## 2022-07-26 MED ADMIN — ESCITALOPRAM OXALATE 10 MG PO TAB [86689]: 10 mg | ORAL | @ 15:00:00 | Stop: 2022-07-26 | NDC 00904642661

## 2022-07-26 MED ADMIN — ENOXAPARIN 40 MG/0.4 ML SC SYRG [85052]: 40 mg | SUBCUTANEOUS | @ 02:00:00 | NDC 00781324602

## 2022-07-26 MED ADMIN — ACETAMINOPHEN 500 MG PO TAB [102]: 1000 mg | ORAL | @ 12:00:00 | Stop: 2022-07-26 | NDC 00904673061

## 2022-07-26 MED ADMIN — DIPHENHYDRAMINE HCL 25 MG PO CAP [2509]: 25 mg | ORAL | @ 03:00:00 | Stop: 2022-07-26 | NDC 00904723761

## 2022-07-26 MED ADMIN — TRAMADOL 50 MG PO TAB [14632]: 50 mg | ORAL | @ 15:00:00 | Stop: 2022-07-26 | NDC 00904717961

## 2022-07-26 MED ADMIN — ACETAMINOPHEN 500 MG PO TAB [102]: 1000 mg | ORAL | @ 02:00:00 | Stop: 2022-07-27 | NDC 00904673061

## 2022-07-26 MED ADMIN — PIPERACILLIN-TAZOBACTAM 4.5 GRAM IV SOLR [80419]: 4.5 g | INTRAVENOUS | @ 12:00:00 | Stop: 2022-07-26 | NDC 60505615900

## 2022-07-26 MED ADMIN — SODIUM CHLORIDE 0.9 % IV PGBK (MB+) [95161]: 4.5 g | INTRAVENOUS | @ 12:00:00 | Stop: 2022-07-26 | NDC 00338915930

## 2022-07-26 MED ADMIN — LEVOTHYROXINE 50 MCG PO TAB [4421]: 50 ug | ORAL | @ 12:00:00 | Stop: 2022-07-26 | NDC 00904695061

## 2022-07-26 MED ADMIN — GABAPENTIN 300 MG PO CAP [18308]: 300 mg | ORAL | @ 15:00:00 | Stop: 2022-07-26 | NDC 00904666661

## 2022-07-26 MED ADMIN — SODIUM CHLORIDE 0.9 % IV PGBK (MB+) [95161]: 4.5 g | INTRAVENOUS | @ 04:00:00 | NDC 00338915930

## 2022-07-26 MED ADMIN — HYDROCORTISONE 1 % TP CREA [3726]: TOPICAL | @ 05:00:00 | Stop: 2022-07-26 | NDC 45802043803

## 2022-07-26 MED ADMIN — CARVEDILOL 6.25 MG PO TAB [77309]: 6.25 mg | ORAL | @ 15:00:00 | Stop: 2022-07-26 | NDC 00904630161

## 2022-07-26 MED ADMIN — ROSUVASTATIN 20 MG PO TAB [88504]: 20 mg | ORAL | @ 15:00:00 | Stop: 2022-07-26 | NDC 68462026390

## 2022-07-26 MED ADMIN — PIPERACILLIN-TAZOBACTAM 4.5 GRAM IV SOLR [80419]: 4.5 g | INTRAVENOUS | @ 04:00:00 | NDC 60505615900

## 2022-07-26 MED FILL — TRAMADOL 50 MG PO TAB: 50 mg | ORAL | 2 days supply | Qty: 10 | Fill #1 | Status: CP

## 2022-07-27 ENCOUNTER — Encounter: Admit: 2022-07-27 | Discharge: 2022-07-27 | Payer: BC Managed Care – PPO

## 2022-07-28 ENCOUNTER — Encounter: Admit: 2022-07-28 | Discharge: 2022-07-28 | Payer: BC Managed Care – PPO

## 2022-07-28 NOTE — Telephone Encounter
The patient was called to follow up following their hospital discharge. The patient was questioned about the following information:     Are you having pain?   Yes. Do you feel you are able to manage your pain adequately? Yes      Are you taking narcotic pain medication?   Yes. Patient reports taking tramadol  Are you using other non-narcotic measures to manage your pain so as to minimize the use of narcotic medications?  Yes. Patient report taking Tylenol .     Are you having any nausea or vomiting? No.      Are you eating? Yes  If you are experiencing a lack of appetite, we would recommend you try to eat 6 small snacks per day, rather than trying to eat 3 big meals.  Your snack choices should be foods with high proteins (examples: yogurt, cheese, eggs, meats, peanut butter with crackers/toast). We also recommend if you have lack of appetite to try and drink a protein shake once or twice daily (Boost, Ensure, or the Nestle Impact AR (the ones you get in your pre-surgery kit). Please note that the 3 recommended protein shakes are not required. You can use a different brand if you prefer something else. Any source of protein will be helpful while healing from surgery.     When was your last bowel movement?  today  Are you taking your recommended bowel regimen? Yes  Diarrhea can be expected after surgery. It may take 5-7 days for your bowel habits to normalize. After 5-7 days we hope that your bowel are soft, yet formed (similar to soft serve ice cream. If your diarrhea does not resolve after 5-7 days, please give our office a call for further discussions.     Did you go home with a Drain? No.   If yes, are you measuring your drain output? N/A.  What are your 24 hour totals of drain output? N/A.    Are you receiving any tube feeds at this time? N/A  If you are on tube feeds, do you have questions? N/A    Did you go home with an ostomy?: N/A  Do you know your 24 hours total output measurements from your ostomy?  N/A.    Would you like an appointment with the ostomy clinic?   N/A.    Do you have home health nursing?  No.      Are there any concerns related to your incision?   No.     Do you know your post-operative appointment information: date and time and location? Yes      Pt has been informed to contact their primary care provider to inform them of their recent surgery and hospital admission.     The pt was reminded to call should they have any further concerns or needs. Pt reminded to call [phone]. Ask to be connected to Einar Pheasant (Nurse for Dr. Daphine Deutscher).Marland Kitchen

## 2022-08-02 ENCOUNTER — Encounter: Admit: 2022-08-02 | Discharge: 2022-08-02 | Payer: BC Managed Care – PPO

## 2022-08-02 DIAGNOSIS — C50411 Malignant neoplasm of upper-outer quadrant of right female breast: Secondary | ICD-10-CM

## 2022-08-03 NOTE — Patient Instructions
Thank you for visiting me today. If you have questions or needs following our visit, please feel free to send my team a MyChart message or give my nurse a call. I value your needs and to ensure your needs are met while I am operating, I work with a team. My team consists of my nurse practitioner (Emily Connor), and my nurse, (Jonia Oakey). They assist with reviewing and answering messages Monday through Friday 8am to 4pm. If it is after 4pm, your message may be returned the following business day. Our office is closed on weekends and major holidays. If you have an urgent need after hours, please call [phone] and ask for the surgical oncology resident on call to be paged.      If testing was ordered today please know it may automatically be released to your mychart. You may review this before we do.     Please feel free call my nurse Jameyah Fennewald at [phone] with any questions.     Thank you for trusting our team with your care,      Dr. Benjamin Martin MD  Emily Connor MSN, APRN, AGCNS-BC, AGPCNP-BC  Suzane Vanderweide BSN, RN  Surgical Oncology / Colorectal Surgery  [address], [place], [place] [address]  Phone: [phone] / Fax: [phone]

## 2022-08-11 ENCOUNTER — Encounter: Admit: 2022-08-11 | Discharge: 2022-08-11 | Payer: 59

## 2022-08-11 DIAGNOSIS — C186 Malignant neoplasm of descending colon: Secondary | ICD-10-CM

## 2022-08-11 DIAGNOSIS — C189 Malignant neoplasm of colon, unspecified: Secondary | ICD-10-CM

## 2022-08-11 DIAGNOSIS — Z9189 Other specified personal risk factors, not elsewhere classified: Secondary | ICD-10-CM

## 2022-08-11 MED ORDER — FERROUS SULFATE 325 MG (65 MG IRON) PO TAB
325 mg | ORAL_TABLET | Freq: Every day | ORAL | 0 refills | Status: DC
Start: 2022-08-11 — End: 2022-08-11

## 2022-08-11 MED ORDER — DEXAMETHASONE 4 MG PO TAB
4 mg | Freq: Once | ORAL | 0 refills
Start: 2022-08-11 — End: ?

## 2022-08-11 MED ORDER — PALONOSETRON 0.25 MG/5 ML IV SOLN
.25 mg | Freq: Once | INTRAVENOUS | 0 refills
Start: 2022-08-11 — End: ?

## 2022-08-11 MED ORDER — FLUOROURACIL IV AMB PUMP
2000 mg/m2 | Freq: Once | INTRAVENOUS | 0 refills
Start: 2022-08-11 — End: ?

## 2022-08-11 MED ORDER — LOPERAMIDE 2 MG PO TAB
ORAL_TABLET | ORAL | 1 refills | 23.00000 days | Status: AC
Start: 2022-08-11 — End: ?

## 2022-08-11 MED ORDER — FERROUS SULFATE 325 MG (65 MG IRON) PO TAB
325 mg | ORAL_TABLET | Freq: Every day | ORAL | 0 refills | Status: AC
Start: 2022-08-11 — End: ?

## 2022-08-11 MED ORDER — LOPERAMIDE 2 MG PO TAB
ORAL_TABLET | ORAL | 1 refills | 23.00000 days | Status: DC
Start: 2022-08-11 — End: 2022-08-11

## 2022-08-11 NOTE — Patient Instructions
Dr. Weijing Sun and Erin Carroll, APRN-NP  Nurses: Kayti Quintina Hakeem, BSN, RN & Melynn Jones, BSN, RN  Phone: [phone]   Fax: [phone]  Available Monday - Friday 8:00 am - 4:00 pm    SCHEDULING NEEDS: [phone]    On-call number for urgent needs outside of business hours: [phone]  Ask for the on-call Oncology fellow to be paged and they will call you back.    Medication refills: Please contact your pharmacy FIRST for medication refills; if they do not have any refills on file, they will contact the office. Make sure they have our correct contact information on file.  P: [phone], F: [phone]    MyChart Messages: All messages come to the nurse and we discuss with Dr. Sun and Erin Carroll, APRN. Please make sure to select Dr. Sun or Erin when sending a message. Messages are answered 8:00 am - 4:00 pm Monday - Friday, holidays excluded. Please keep in mind we have 4 hours to respond to your messages.    Phone Calls: Our phone number is strictly a nurse voicemail. We check these messages all day and will get back with you. Please state your full name, date of birth, and reason for your call with as much detail as possible. Please keep in mind we have 4 hours to return your call.    FMLA/Insurance/Disability paperwork: Please fill out as much of the paperwork as you are able. We request that you give us 7-10 business days to complete this as we have a high volume of paperwork to complete for our patients.

## 2022-08-11 NOTE — Progress Notes
waiting on consent

## 2022-08-11 NOTE — Progress Notes
Name: Sherette Moschella          MRN: [id number]      DOB: 1975/12/08      AGE: 47 y.o.   DATE OF SERVICE: 08/11/2022    Subjective:             Reason for Visit:  New CA Pt      Neveyah Jeidy Levitan is a 47 y.o. female.      Cancer Staging   Malignant neoplasm of upper-outer quadrant of right breast in female, estrogen receptor positive (HCC)  Staging form: Breast, AJCC 8th Edition  - Clinical stage from 07/04/2019: Stage IA (cT1c, cN0(f), cM0, G2, ER+, PR+, HER2-) - Signed by Guy Begin, PA-C on 07/12/2019  - Pathologic stage from 08/21/2019: Stage IA (pT1c, pN1a, cM0, G2, ER+, PR+, HER2-) - Signed by Guy Begin, PA-C on 08/30/2019      History of Present Illness    47 y.o. female with hx of stage IA ER/PR+, HER2- IDC of the right breast diagnosed in 06/2019 after detecting dimpling of her breast in 05/2019. She presented for mammogram which identified a spiculated mass in the upper right medial breast. She underwent ultrasound and biopsy that confirmed carcinoma . Pathology review at [facility] confirmed grade 2 IDC with a focal lobular growth pattern and lymphoid tissue with no evidence of carcinoma in the right axillary specimen. She was recommended to undergo upfront surgery. On 08/01/2019, she underwent bilateral skin sparing mastectomy and right sentinel lymph node biopsy, axilla lymph node dissection, and reconstruction.  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  A 5 mm focus of metastatic carcinoma was found in 1 of 4 sentinel lymph nodes.  0 of 27 additional axillary nodes were involved. pT1c N1a M0 (1 of 34 LN+) M0. No LVI, No EIC, No ENE. She was recommended to receive adjuvant chemotherapy with TC x4 cycles,  underwent adjuvant radiotherapy to her right reconstructed breast, chest wall and regional nodes to a dose of 4526 cGy in 16 fractions from 02/19/2020 - 03/11/2020.      She is on tamoxifen and zoladex.     history of hypertension, polycystic ovarian disease, and prediabetesa colonoscopy     She had a colonoscopy performed on May 29th, 2024. The colonoscopy revealed a moderately differentiated adenocarcinoma suspected to be at the splenic flexure, 60 cm from the anal verge, and a large rectosigmoid polyp described as pedunculated. The polyp was biopsied but not removed and was identified as a tubulovillous adenoma. CT of the chest, abdomen, and pelvis, which showed no evidence of metastatic disease. The patient's CEA level was 0.8, and she was found to be microsatellite stable.     07/23/2022  Extended left hemi colectomy: pT3N2 (LN 5/48) mod diff adenocarcinoma, Lymphatic and / or Vascular Invasion: Present; Perineural Invasion--Present        Here for consultation about adjuvant chemotherapy. Overall recovering from surgery reasonable well, only complaint is mild intermittent lightheadedness, no F/C/N/V/D, no pain, no SOB, no DOE.     Accompanied by her husband.      Review of Systems 12 system reviewed    Mother had polyps  2 siblings: age 88, 50; 2 biologic children: 58, and 33 yrs old     Objective:          carvediloL (COREG) 6.25 mg tablet Take one tablet by mouth twice daily with meals. Take with  food.    CHOLEcalciferoL (vitamin D3) (OPTIMAL D3) 50,000 units capsule Take one capsule by mouth every 7 days. Sundays    ciclopirox (PENLAC) 8 % topical solution Apply topically to entire affected nail(s) once daily.  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    levothyroxine (SYNTHROID) 50 mcg tablet Take one tablet by mouth daily.    losartan (COZAAR) 50 mg tablet Take one tablet by mouth daily.    metFORMIN (GLUCOPHAGE) 1,000 mg tablet Take one tablet by mouth daily with breakfast.    montelukast (SINGULAIR) 10 mg tablet Take one tablet by mouth at bedtime daily.    rosuvastatin (CRESTOR) 5 mg tablet Take one tablet by mouth daily.    tamoxifen (NOLVADEX) 20 mg tablet TAKE ONE TABLET BY MOUTH EVERY NIGHT AT BEDTIME    tirzepatide (MOUNJARO) 7.5 mg/0.5 mL injector PEN Inject 0.5 mL under the skin every 7 days. wednesday    traMADoL (ULTRAM) 50 mg tablet Take one tablet to two tablets by mouth every 6 hours as needed. Indications: pain     Vitals:    08/11/22 0805   PainSc: Zero     There is no height or weight on file to calculate BMI.     Pain Score: Zero       Fatigue Scale: 5    Pain Addressed:  N/A    Patient Evaluated for a Clinical Trial: Patient currently in screening for a treatment clinical trial.     Eastern Cooperative Oncology Group performance status is 0, Fully active, able to carry on all pre-disease performance without restriction.Marland Kitchen     Physical Exam  Constitutional:       Appearance: Normal appearance.   HENT:      Mouth/Throat:      Mouth: Mucous membranes are moist.   Eyes:      Extraocular Movements: Extraocular movements intact.      Conjunctiva/sclera: Conjunctivae normal.      Pupils: Pupils are equal, round, and reactive to light.   Cardiovascular:      Rate and Rhythm: Normal rate and regular rhythm.      Pulses: Normal pulses.      Heart sounds: Normal heart sounds.   Pulmonary:      Effort: Pulmonary effort is normal.      Breath sounds: Normal breath sounds.   Abdominal:      Comments: See the history   Musculoskeletal:      Cervical back: Normal range of motion and neck supple.      Comments: Right arm lymphedema sleeve    Skin:     General: Skin is warm.   Neurological:      General: No focal deficit present.      Mental Status: She is alert and oriented to person, place, and time.   Psychiatric:         Mood and Affect: Mood normal.         Behavior: Behavior normal.          07/23/2022  Pathology    Final Diagnosis:     A. 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      B. Colon, extended left hemi colectomy:    Adenocarcinoma moderately differentiated. See comment.   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 Additional proximal margin#1, excision:   Colon tissue with Tubular adenomas. See comment.   Three lymph nodes negative for metastatic carcinoma (0/3).     D. Additional proximal margins #2, excision:    Tubulovillous adenoma with focal high-grade dysplasia   Eleven lymph nodes negative for metastatic carcinoma (0/11).       Comment:   Pursuant to the Quality Assurance Program at the [facility] Pathology Department, selected slides from this case have been   concurrently reviewed by the following pathologist: Dr. Juanetta Beets   who   agrees with the final diagnosis.       CASE SUMMARY: (COLON AND RECTUM: Resection)   Standard(s): AJCC-UICC 8   CAP Version: Colon and Rectum Resection 4.3.0.0     SPECIMEN     Procedure   ___ Subtotal (extended) left abdominal colectomy     Macroscopic Evaluation of Mesorectum (required for rectal cancers)   ___ Not applicable     TUMOR     Tumor Site (select all that apply)   ___ Transverse colon/Splenic flexure     Histologic Type   ___ Adenocarcinoma     Histologic Grade   ___ G2, moderately differentiated     Tumor Size   ___ Greatest dimension in Centimeters (cm): 4.0 x 3.8 x 0.7 cm     Multiple Primary Sites (e.g., hepatic flexure and transverse colon)   ___ Not applicable (no additional primary site(s) present)     Tumor Extent   ___ Invades through muscularis propria into the pericolonic or perirectal   tissue     Sub-mucosal Invasion (required only for pT1 tumors)   ___ Not applicable (not a pT1 tumor)     Macroscopic Tumor Perforation   ___ Not identified     Lymphatic and / or Vascular Invasion(select all that apply)   ___ Present (not otherwise specified)     Perineural Invasion   ___ Present     Tumor Budding Score   ___ Intermediate (5-9)     Treatment Effect   ___ No known presurgical therapy     MARGINS     Margin Status for Invasive Carcinoma   ___ All margins negative for invasive carcinoma   +Closest Margin(s) to Invasive Carcinoma (select all that apply)   ___ Radial (circumferential)/Mesenteric   +Distance from Invasive Carcinoma to Closest Margin   Specify in Centimeters (cm)   ___ Exact distance in cm: 3.4 cm   Distance from Invasive Carcinoma to Radial (Circumferential) Margin   (required for rectal   tumors)   ___ Distance already reported as closest margin.   +Distance from Invasive Carcinoma to Distal Margin (recommended for rectal   tumors)   Specify in Centimeters (cm)   ___ Exact distance in cm: 58.0 cm   Margin(s) Involved by Invasive Carcinoma (select all that apply)   ___ Not applicable     Margin Status for Non-Invasive Tumor (select all that apply)   ___ All margins negative for high-grade dysplasia / intramucosal carcinoma   and low-grade dysplasia     REGIONAL LYMPH NODES     Regional Lymph Node Status   ___ Regional lymph nodes present   ___ Tumor present in regional lymph nodes   Number of Lymph Nodes with Tumor   ___ Exact number (specify): 5  Number of Lymph Nodes Examined   ___ Exact number (specify): 34     Tumor Deposits   ___ Not identified     DISTANT METASTASIS     Distant Site(s) Involved, if applicable (select all that apply)   ___ Not applicable     pTNM CLASSIFICATION (AJCC 8th Edition)   Reporting of pT, pN, and (when applicable) pM categories is based on   information available to the pathologist at the time the report is issued.   As per the AJCC (Chapter 1, 8th Ed.) it is the managing physician's   responsibility to establish the final pathologic stage based upon all   pertinent information, including but potentially not limited to this   pathology report.     Modified Classification (required only if applicable) (select all that   apply) ___ Not applicable     pT Category   ___ pT3: Tumor invades through the muscularis propria into pericolorectal   tissues     T Suffix (required only if applicable)   ___ Not applicable     pN Category   pN2: Four or more regional nodes are positive   ___ pN2a: Four to six regional lymph nodes are positive     pM Category (required only if confirmed pathologically)   ___ Not applicable - pM cannot be determined from the submitted   specimen(s)     ADDITIONAL FINDINGS     +Additional Findings (select all that apply)   ___ Adenoma(s) (Tubulovillous adenoma with high grade dysplasia, 6.3 cm.   1.9 cm., rectosigmoid; tubular adenomas)        Assessment and Plan:  47 yr old female with history of right breast cancer (07/2019): ER/PR+, HER2- IDC pT1c N1a M0 (1 of 34 LN+) M0.  on  tamoxifen and zoladex. Who has newly diagnosed left colon adenocarcinoma s/P: pT3N2 (LN 5/48) mod diff , positive for  Lymphatic and / or Vascular Invasion, and Perineural Invasion.   Here for consultation about adjuvant chemotherapy. Overall recovering from surgery reasonable well, only complaint is mild intermittent lightheadedness, no F/C/N/V/D, no pain, no SOB, no DOE.   Accompanied by her husband.     PS ECOG 0    Reviewed images and pathology with the pt and husband about her stage of disease, the risk of MRD/recurrence of disease, the rationale/mechanism of adjuvant treatment, risk vs benefit, regimen of FOLFOX (a schedule, timing, length), and potential side effects. (Info given and consent done)   Also discussed the surveillance   We discussed the study of ctDNA (C-14), the pt is interested in    -- Homer cath  -- genetic consultation and instruction  -- new baseline images  -- plan to start mFOLFOX in 3 weeks  -- continue follow breast cancer care team  -- follow with surgery team  -- call us for issues    The patient and family had a chance to ask questions, all been answered to their satisfaction.       Miguel Rota, MD, Jerrel Ivory

## 2022-08-11 NOTE — Research Notes
Study Title: CORRECT-MRD II: Second Colorectal Cancer Clinical Validation Study to Predict Recurrence Using a Circulating Tumor DNA Assay to Detect Minimal Residual Disease  HSC: [id number]  Consent Version Date: Version 12 Nov 2020 Revised 04 Dec 2020    Clinical trial participation and research nature of the trial were discussed with the participant. Participant was alert and oriented during consent discussion. Participant was informed that clinical trial is voluntary and they may withdraw consent at any time for any reason by notifying study team. Study purpose, procedures, tests, samples to be obtained, potential side effects, benefits, foreseeable risks and duration of study were discussed. HIPAA information, compensation and insurance pre-certification were discussed per consent form. An appointment with the financial counselor will be scheduled for the participant. Alternative courses of treatment were also discussed per consent. Participant verbalized understanding.      Participant was given time to review the consent form and to discuss participation in this study. Questions asked were answered to their satisfaction and participant voiced desire to sign consent today. Participant signed consent without coercion and undue influence. A copy of the signed consent was given to the participant and emailed to [facility] Information Management ([facility]) for scanning into the participant's medical record in [employer]. Contact information for the study team was given to the participant. Participant was informed study participation can be terminated due to disease progression, participant withdrawal, unanticipated toxicity, investigator discretion, sponsor or FDA. Participant was informed that new findings involving potential risk will be discussed for continuous consenting to trial.     Participant agreed to let blood, saliva, and tissue samples to be stored and used for future research.     No research specific procedures took place prior to consenting.

## 2022-08-12 ENCOUNTER — Encounter: Admit: 2022-08-12 | Discharge: 2022-08-12 | Payer: 59

## 2022-08-12 DIAGNOSIS — E88819 Insulin resistance: Secondary | ICD-10-CM

## 2022-08-12 DIAGNOSIS — Z923 Personal history of irradiation: Secondary | ICD-10-CM

## 2022-08-12 DIAGNOSIS — E669 Obesity, unspecified: Secondary | ICD-10-CM

## 2022-08-12 DIAGNOSIS — C189 Malignant neoplasm of colon, unspecified: Secondary | ICD-10-CM

## 2022-08-12 DIAGNOSIS — I10 Essential (primary) hypertension: Secondary | ICD-10-CM

## 2022-08-12 DIAGNOSIS — F3289 Other specified depressive episodes: Secondary | ICD-10-CM

## 2022-08-12 DIAGNOSIS — Z789 Other specified health status: Secondary | ICD-10-CM

## 2022-08-12 DIAGNOSIS — F32A Depression: Secondary | ICD-10-CM

## 2022-08-12 DIAGNOSIS — R12 Heartburn: Secondary | ICD-10-CM

## 2022-08-12 DIAGNOSIS — E039 Hypothyroidism, unspecified: Secondary | ICD-10-CM

## 2022-08-12 DIAGNOSIS — T7840XA Allergy, unspecified, initial encounter: Secondary | ICD-10-CM

## 2022-08-12 DIAGNOSIS — J302 Other seasonal allergic rhinitis: Secondary | ICD-10-CM

## 2022-08-12 DIAGNOSIS — E785 Hyperlipidemia, unspecified: Secondary | ICD-10-CM

## 2022-08-12 DIAGNOSIS — C50919 Malignant neoplasm of unspecified site of unspecified female breast: Secondary | ICD-10-CM

## 2022-08-12 DIAGNOSIS — R000 Tachycardia, unspecified: Secondary | ICD-10-CM

## 2022-08-12 DIAGNOSIS — E282 Polycystic ovarian syndrome: Secondary | ICD-10-CM

## 2022-08-12 DIAGNOSIS — F419 Anxiety disorder, unspecified: Secondary | ICD-10-CM

## 2022-08-12 NOTE — Patient Education
Dear Mrs. Leighton Parody,    Thank you for choosing [facility] Interventional Radiology for your procedure. Your appointment information is listed below:    Appointment Date: 08/16/22  Arrival Time: Please arrive for Check-in at: 2 PM  Appointment Time: 3 PM    Location:     [facility]: [address] [place], [place] [address]   Parking: available in the front of the building      INTERVENTIONAL RADIOLOGY  PRE-PROCEDURE INSTRUCTIONS SEDATION    You are scheduled for a procedure in Interventional Radiology with procedural sedation.  Please follow these instructions and any direction from your Primary Care/Managing Physician.  If you have questions about your procedure or need to reschedule please call [phone].    Medication Instructions:   You may take the following medications with a small sip of water:  Continue taking your normal morning medications as directed.  As for your Diabetic Medication, please follow the guidelines established by your physician for when you are fasting. Hold your [facility] 08/13/22. You may resume after your procedure 08/16/22.    Diet Instructions:  (8) hours 7 AM before your procedure, stop your regular diet and start a clear liquid diet.  (6) hours before your procedure, discontinue tube feedings and chewing tobacco.  (2) hours 1 PM before your procedure discontinue clear liquids.  You should have nothing by mouth. This includes GUM or CANDY.       Clear Liquid Diet  Water  Apple or White Grape Juice  Coffee or Tea without milk, cream, or dairy   Tea  White Cranberry Juice   Chicken Bouillon or Broth (no noodles)  Soda Pop  Popsicles                                      Beef Bouillon or Broth (no noodles)    Day of Exam Instructions:  Bathe or shower with an antibacterial soap prior to your appointment.  If you have a history of Obstructive Sleep Apnea (OSA) bring your CPAP/BIPAP.   Bring a list of your current medications and the dosages.  Wear comfortable clothing and leave valuables at home.  Arrive (1) hour prior to your appointment.  This time will be spent registering, interviewing, assessing, educating and preparing you for the test.  You will be with [place] anywhere from 30 minutes to 6 hours after your exam depending on your procedure.  You will be sedated for the procedure. A responsible adult must drive you home (no Benedetto Goad, taxis or buses are allowed) and stay with you overnight. If you do not have a driver we will be unable to perform your procedure.   You will not be able to return to work or drive the same day if receiving sedation.

## 2022-08-12 NOTE — Progress Notes
Interventional Radiology Outpatient Scheduling Checklist      1.  Name of Procedure(s):   port placement      2.  Date of Procedure:   08/16/22      3.  Arrival Time:   1400      4.  Procedure Time:  1500      5.  Correct Procedural Room Assignment:  ICC rm 1      6.  Blood Thinners Triaged and instructed per protocol: Y/N/NA:  NA  Confirmed accurate instructions sent to patient: Y/N:  NA       7.  Procedure Order Verified: Y/N:  Yes    8.  Patient instructed to have a driver: Y/N/NA:  Yes    9.  Patient instructed on NPO status: Y/N/NA:  Yes  Confirmed accurate instructions sent to patient: Y/N:  Yes    10.  Specimen needed: Y/N/NA:  na  Verified Order placed: Y/N:  na    11.  Allergies Verified:  Y/N:  Yes    12.  Has the patient ever had a procedure with contrast (heart cath, CT scan, IVP, etc)? y    63. Has the patient ever had an adverse reaction to iodine or iodinated contrast, including but not limited to trouble breathing, swelling around the face or throat, itching, or rash? n    14. Has the allergy list been updated? na    15.  Does the patient have labs according to IR Pre-procedure Laboratory Parameter policy: Y/N/NA:  Yes  If No, was the patient instructed to obtain labs prior to procedure: Y/N/NA:  NA     16.  Will the patient need to be admitted or have a possible admission: Y/N:  No  If yes, confirmed accurate instructions sent to patient: Y/N/NA:  NA     17.  Patient States Understanding: Y/N:  Yes    18.  History of OSA:  Y/N:  No  If yes, confirm request to bring CPAP sent to patient: Y/N/NA:  NA    19. Does the patient have an insulin pump or continuous glucose monitor? N   If yes, was the patient instructed that this will need to be removed for this procedure and to bring supplies to reapply once the procedure is complete? Y/N    20. Patient was sent electronic procedure instructions: Y/N:  Yes     21.  Is patient scheduled with anesthesia/sedation?  Y         Taking GLP-1 agonist (ex. Weekly- Ozempic, Mounjaro, Trulicity, Zepbound, Daily- Victoza, Saxenda, Rybelsus) Y         Weekly hold for a week. Last dose was 7/12. Will hold 7/19 dose         Daily hold day of procedure  NA

## 2022-08-13 ENCOUNTER — Encounter: Admit: 2022-08-13 | Discharge: 2022-08-13 | Payer: 59

## 2022-08-16 ENCOUNTER — Encounter: Admit: 2022-08-16 | Discharge: 2022-08-16 | Payer: 59

## 2022-08-16 ENCOUNTER — Ambulatory Visit: Admit: 2022-08-16 | Discharge: 2022-08-16 | Payer: 59

## 2022-08-16 DIAGNOSIS — Z006 Encounter for examination for normal comparison and control in clinical research program: Secondary | ICD-10-CM

## 2022-08-16 DIAGNOSIS — C189 Malignant neoplasm of colon, unspecified: Secondary | ICD-10-CM

## 2022-08-16 MED ORDER — MIDAZOLAM 1 MG/ML IJ SOLN
0 refills | Status: CP
Start: 2022-08-16 — End: ?

## 2022-08-16 MED ORDER — SODIUM CHLORIDE 0.9 % IV SOLP
1000 mL | Freq: Once | INTRAVENOUS | 0 refills | Status: DC
Start: 2022-08-16 — End: 2022-08-16

## 2022-08-16 MED ORDER — MIDAZOLAM 1 MG/ML IJ SOLN
1 mg | Freq: Once | INTRAVENOUS | 0 refills | Status: CP
Start: 2022-08-16 — End: ?
  Administered 2022-08-16: 20:00:00 1 mg via INTRAVENOUS

## 2022-08-16 MED ORDER — HEPARIN, PORCINE (PF) 100 UNIT/ML IV SYRG
0 refills | Status: CP
Start: 2022-08-16 — End: ?

## 2022-08-16 MED ORDER — FLUMAZENIL 0.1 MG/ML IV SOLN
.2 mg | INTRAVENOUS | 0 refills | Status: DC | PRN
Start: 2022-08-16 — End: 2022-08-16

## 2022-08-16 MED ORDER — NALOXONE 0.4 MG/ML IJ SOLN
.08 mg | INTRAVENOUS | 0 refills | Status: DC | PRN
Start: 2022-08-16 — End: 2022-08-16

## 2022-08-16 MED ORDER — FENTANYL CITRATE (PF) 50 MCG/ML IJ SOLN
0 refills | Status: CP
Start: 2022-08-16 — End: ?

## 2022-08-16 MED ORDER — SODIUM CHLORIDE 0.9 % IV SOLP
0 refills | Status: CP
Start: 2022-08-16 — End: ?

## 2022-08-16 NOTE — H&P (View-Only)
Pre Procedure History and Physical/Sedation Plan-OP    Procedure Date: 08/16/2022     Planned Procedure(s):  Port placement     Indication for exam:  Route for treatment   __________________________________________________________________    Chief Complaint:  Colon Ca     History of Present Illness: Jennifer Durham is a 47 y.o. female.    Patient Active Problem List    Diagnosis Date Noted    Colon cancer (HCC) 07/23/2022    Hypertension 11/30/2021    PCOS (polycystic ovarian syndrome) 11/30/2021    Acquired absence of bilateral breasts and nipples 08/02/2021    Deformity and disproportion of reconstructed breast 01/22/2021    Abdominal wall seroma 08/05/2020    S/P flap graft 07/10/2020    AKI (acute kidney injury) (HCC) 07/04/2020    Morbid obesity (HCC) 07/04/2020    Tachycardia 07/04/2020    S/P breast reconstruction 07/02/2020    Obesity (BMI 35.0-39.9 without comorbidity) 05/06/2020    History of external beam radiation therapy 05/06/2020    Breast cancer in female [facility]) 08/21/2019    Malignant neoplasm of upper-outer quadrant of right breast in female, 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    Tear of peroneal tendon 12/15/2016     Past Medical History:   Diagnosis Date    Allergy     Anxiety     Anxiety disorder     Breast cancer (HCC) 07/04/2019    right    Cancer of colon (HCC) 06/23/22    Depression 11/25/2021    Depressive disorder, not elsewhere classified     Heartburn     Hx of radiation therapy 2022    Hyperlipemia 11/23    Hypertension     Hypothyroidism 09/2020    Insulin resistance     Limb alert care status     No venipuncture/BP's to RUE    Obesity     PCOS (polycystic ovarian syndrome)     Personal history of irradiation 02/2020    Seasonal allergies     Frequent sinusitis    Tachyarrhythmia       Surgical History:   Procedure Laterality Date    HX TONSILLECTOMY  2001    turbinates/adenoids 2015    HX TONSIL AND ADENOIDECTOMY  2011    LEFT PERONEAL TENDON REPAIR Left 12/15/2016 Performed by Tanja Port, MD at [facility] OR    Left Skin Sparing Mastectomy Left 08/21/2019    Performed by Renford Dills, MD at [facility] OR    IDENTIFICATION SENTINEL LYMPH NODE Right 08/21/2019    Performed by Renford Dills, MD at [facility] OR    INJECTION RADIOACTIVE TRACER FOR SENTINEL NODE IDENTIFICATION Right 08/21/2019    Performed by Renford Dills, MD at [facility] OR    RADIOLOGICAL EXAM SURGICAL SPECIMEN Right 08/21/2019    Performed by Renford Dills, MD at [facility] OR    MASTECTOMY - MODIFIED RADICAL WITH AXILLARY LYMPH NODE DISSECTION Right 08/21/2019    Performed by Renford Dills, MD at [facility] OR    RECONSTRUCTION BREAST WITH TISSUE EXPANDER AND SUBSEQUENT EXPANSION - IMMEDIATE/ DELAYED Bilateral 08/21/2019    Performed by Marlinda Mike, MD at [facility] OR    IMPLANTATION BIOLOGIC IMPLANT FOR SOFT TISSUE REINFORCEMENT Bilateral 08/21/2019    Performed by Marlinda Mike, MD at [facility] OR    RECONSTRUCTION BREAST WITH DEEP INFERIOR EPIGASTRIC PERFORATOR FLAP/ SUPERFICIAL INFERIOR EPIGASTRIC ARTERY FLAP Bilateral 07/02/2020    Performed by Marlinda Mike, MD at [facility]  OR    RECONSTRUCTION BREAST WITH FREE FLAP - request 22 modifier, complicated procedure due to obesity and intramuscular course Bilateral 07/02/2020    Performed by Marlinda Mike, MD at [facility] OR    INTRAVENOUS INJECTION AGENT TO TEST VASCULAR FLOW IN FLAP/ GRAFT Bilateral 07/02/2020    Performed by Marlinda Mike, MD at [facility] OR    APPLICATION NEGATIVE PRESSURE WOUND THERAPY Bilateral 07/02/2020    Performed by Marlinda Mike, MD at [facility] OR    IMPLANTATION MESH CLOSURE OF DEBRIDEMENT FOR NECROTIZING SOFT TISSUE INFECTION Bilateral 07/02/2020    Performed by Marlinda Mike, MD at [facility] OR    REMOVAL TISSUE EXPANDER Bilateral 07/02/2020    Performed by Marlinda Mike, MD at [facility] OR    INCISION AND DRAINAGE HEMATOMA/ SEROMA - TORSO N/A 08/05/2020    Performed by Marlinda Mike, MD at [facility] OR    REPAIR INTERMEDIATE WOUND 12.6 CM TO 20.0 CM - TRUNK - total length 24cm  08/05/2020    Performed by Marlinda Mike, MD at [facility] OR    GRAFTING AUTOLOGOUS FAT BY LIPOSUCTION TO TRUNK/ BREASTS/ SCALP/ ARMS/ LEGS - 50 CC OR LESS 60cc to the R, 90cc to the L Bilateral 08/10/2021    Performed by Marlinda Mike, MD at [facility] OR    GRAFTING AUTOLOGOUS FAT BY LIPOSUCTION TO TRUNK/ BREASTS/ SCALP/ ARMS/ LEGS - EACH ADDITIONAL 50 CC Bilateral 08/10/2021    Performed by Marlinda Mike, MD at [facility] OR    REVISION RECONSTRUCTED BREAST Left 08/10/2021    Performed by Marlinda Mike, MD at [facility] OR    REPAIR INTERMEDIATE WOUND  2.5 CM OR LESS - TORSO abdominal dogear removal R side - 9cm, L side - 10cm N/A 08/10/2021    Performed by Marlinda Mike, MD at [facility] OR    COLONOSCOPY DIAGNOSTIC WITH SPECIMEN COLLECTION BY BRUSHING/ WASHING - FLEXIBLE N/A 06/23/2022    Performed by Buckles, Vinnie Level, MD at [facility] OR    ROBOT ASSISTED LEFT COLECTOMY, CONVERTED TO OPEN N/A 07/23/2022    Performed by Benetta Spar, MD at [facility] OR    ROBOT ASSISTED SURGERY N/A 07/23/2022    Performed by Benetta Spar, MD at [facility] OR    BREAST SURGERY      CARDIOVASCULAR STRESS TEST      COSMETIC SURGERY      ECHOCARDIOGRAM PROCEDURE      ELECTROCARDIOGRAM        Medications Prior to Admission   Medication Sig Dispense Refill Last Dose    carvediloL (COREG) 6.25 mg tablet Take one tablet by mouth twice daily with meals. Take with food. 180 tablet 3     CHOLEcalciferoL (vitamin D3) (OPTIMAL D3) 50,000 units capsule Take one capsule by mouth every 7 days. Sundays       ciclopirox (PENLAC) 8 % topical solution Apply topically to entire affected nail(s) once daily.  Indications: a fungal disease of the nails called dermatophyte onychomycosis 6.6 mL 6     escitalopram oxalate (LEXAPRO) 10 mg tablet TAKE ONE TABLET BY MOUTH EVERY DAY 90 tablet 3     ferrous sulfate (IRON) 325 mg (65 mg iron) tablet Take one tablet by mouth daily. Take on an empty stomach at least 1 hour before or 2 hours after food. 90 tablet 0     INTRAUTERINE DEVICE (IUD) IU by Intrauterine route.       levothyroxine (SYNTHROID) 50 mcg tablet Take  one tablet by mouth daily.       loperamide (IMODIUM A-D) 2 mg tablet 1 tablet 30-45 minutes prior to breakfast, lunch, and dinner 90 tablet 1     losartan (COZAAR) 50 mg tablet Take one tablet by mouth daily.       metFORMIN (GLUCOPHAGE) 1,000 mg tablet Take one tablet by mouth daily with breakfast.       montelukast (SINGULAIR) 10 mg tablet Take one tablet by mouth at bedtime daily.       rosuvastatin (CRESTOR) 5 mg tablet Take one tablet by mouth daily.       tamoxifen (NOLVADEX) 20 mg tablet TAKE ONE TABLET BY MOUTH EVERY NIGHT AT BEDTIME 90 tablet 3     tirzepatide (MOUNJARO) 7.5 mg/0.5 mL injector PEN Inject 0.5 mL under the skin every 7 days. wednesday       traMADoL (ULTRAM) 50 mg tablet Take one tablet to two tablets by mouth every 6 hours as needed. Indications: pain 10 tablet 0      Allergies   Allergen Reactions    Tegaderm RASH       Social History:   Social History     Tobacco Use    Smoking status: Never     Passive exposure: Never    Smokeless tobacco: Never   Substance Use Topics    Alcohol use: Yes     Alcohol/week: 2.0 standard drinks of alcohol     Types: 2 Drinks containing 0.5 oz of alcohol per week     Comment: socially      Family History   Problem Relation Name Age of Onset    Heart Attack Mother Merri Brunette     Cancer Mother Merri Brunette     Diabetes Mother Merri Brunette     [facility] Mother Merri Brunette 50    Heart Disease Mother Merri Brunette     Hypertension Mother Merri Brunette     High Cholesterol Mother Merri Brunette     Sudden Cardiac Death Mother Merri Brunette     Heart Disease Father [place]     Hypothyroid Father [place]     Hypertension Father [place]     Thyroid Disease Father [place]     High Cholesterol Father Rockwell [place]     Hypertension Brother Royal Hawthorn     Thyroid Disease Brother Royal Hawthorn     Cancer-Breast Maternal Aunt [place] Podzimek 50    Cancer-Ovarian Maternal Aunt [place] Podzimek [place]    Diabetes Maternal Aunt [place] Podzimek Arthritis-rheumatoid Maternal Aunt Wilma Podzimek     Diabetes Maternal Amie Critchley     Heart Disease Maternal Grandfather Jolaine Artist     Sudden Cardiac Death Maternal Bevelyn Ngo     Thyroid Disease Brother Magda Bernheim         Review of Systems  All other systems reviewed and are negative.    Previous Anesthetic/Sedation History:  reviewed    Code Status: Full Code    Physical Exam:  Vital Signs: Last Filed In 24 Hours Vital Signs: 24 Hour Range   BP: 107/64 (07/22 1355)  Temp: 36.8 ?C (98.3 ?F) (07/22 1355)  Pulse: 87 (07/22 1355)  Respirations: 16 PER MINUTE (07/22 1355)  SpO2: 95 % (07/22 1355)  Height: 182.9 cm (6') (07/22 1355) BP: (107)/(64)   Temp:  [36.8 ?C (98.3 ?F)]   Pulse:  [87]   Respirations:  [16 PER MINUTE]   SpO2:  [95 %]  General appearance: alert and no distress  Neurologic: Grossly normal, at baseline  Lungs: Nonlabored with normal effort  Abdomen: soft, non-tender.   Extremities: extremities normal,       Airway:  airway assessment performed  Mallampati II (soft palate, uvula, fauces visible)   Anesthesia Classification:  ASA III (A patient with a severe systemic disease that limits activity, but is not incapacitating)  Pre procedure anxiolysis plan: Midazolam  Sedation/Medication Plan: Fentanyl, Lidocaine, and Midazolam  Personal history of sedation complications: Denies adverse event.   Family history of sedation complications: Denies adverse event.   Medications for Reversal: Naloxone and Flumazenil  Discussion/Reviews:  Physician has discussed risks and alternatives of this type of sedation and above planned procedures with patient  NPO Status: Acceptable  Pregnancy Status: N/A      Pt is not on a therapeutic blood thinner  Lab/Radiology/Other Diagnostic Tests:  Labs:  Hematology:    Lab Results   Component Value Date    HGB 8.3 07/24/2022    HCT 25.2 07/24/2022    PLTCT 186 07/24/2022    WBC 7.9 07/24/2022    NEUT 66 07/03/2022    ANC 4.10 07/03/2022 ALC 1.31 07/03/2022    MONA 7 07/03/2022    AMC 0.40 07/03/2022    EOSA 5 07/03/2022    ABC 0.04 07/03/2022    MCV 80.9 07/24/2022    MCH 26.6 07/24/2022    MCHC 32.9 07/24/2022    MPV 8.1 07/24/2022    RDW 16.1 07/24/2022   , Coagulation:  No results found for: PT, PTT, INR, and General Chemistry:    Lab Results   Component Value Date    NA 142 07/26/2022    K 3.4 07/26/2022    CL 108 07/26/2022    CO2 26 07/26/2022    GAP 8 07/26/2022    BUN 13 07/26/2022    CR 1.03 07/26/2022    GLU 101 07/26/2022    CA 8.4 07/26/2022    ALBUMIN 3.9 07/03/2022    OBSCA 0.94 07/23/2022    TOTBILI 0.4 07/03/2022              Philmore Pali, APRN-NP  Pager [phone]

## 2022-08-16 NOTE — Progress Notes
Sedation physician present in room. Recent vitals and patient condition reviewed between sedating physician and nurse. Reassessment completed. Determination made to proceed with planned sedation.

## 2022-08-16 NOTE — Progress Notes
Jennifer Durham discharged on 08/16/2022.   Marland Kitchen  Discharge instructions reviewed with patient.  Valuables returned:    .  Home medications:    .  Functional assessment at discharge complete: Yes .        AVS reviewed with patient. No further questions asked. PIV taken out. VS stable. No complaints of pain or nausea. Procedure site is clean, dry, and intact. Pt left unit via wheelchair and DC with family.

## 2022-08-18 ENCOUNTER — Encounter: Admit: 2022-08-18 | Discharge: 2022-08-18 | Payer: 59

## 2022-08-18 ENCOUNTER — Ambulatory Visit: Admit: 2022-08-18 | Discharge: 2022-08-18 | Payer: 59

## 2022-08-18 DIAGNOSIS — C189 Malignant neoplasm of colon, unspecified: Secondary | ICD-10-CM

## 2022-08-18 DIAGNOSIS — Z006 Encounter for examination for normal comparison and control in clinical research program: Secondary | ICD-10-CM

## 2022-08-18 DIAGNOSIS — C50411 Malignant neoplasm of upper-outer quadrant of right female breast: Secondary | ICD-10-CM

## 2022-08-18 MED ORDER — LIDOCAINE (PF) 10 MG/ML (1 %) IJ SOLN
2 mL | Freq: Once | SUBCUTANEOUS | 0 refills | Status: AC
Start: 2022-08-18 — End: ?

## 2022-08-18 MED ORDER — GOSERELIN 10.8 MG SC IMPL
10.8 mg | Freq: Once | SUBCUTANEOUS | 0 refills | Status: CP
Start: 2022-08-18 — End: ?
  Administered 2022-08-18: 16:00:00 10.8 mg via SUBCUTANEOUS

## 2022-08-18 MED ORDER — IOHEXOL 350 MG IODINE/ML IV SOLN
100 mL | Freq: Once | INTRAVENOUS | 0 refills | Status: CP
Start: 2022-08-18 — End: ?
  Administered 2022-08-18: 19:00:00 100 mL via INTRAVENOUS

## 2022-08-18 MED ORDER — SODIUM CHLORIDE 0.9 % IJ SOLN
50 mL | Freq: Once | INTRAVENOUS | 0 refills | Status: CP
Start: 2022-08-18 — End: ?
  Administered 2022-08-18: 19:00:00 50 mL via INTRAVENOUS

## 2022-08-18 NOTE — Research Notes
Protocol Name:16-002/NSABP C-14   Study Title: CORRECT-MRD II: Second Colorectal Cancer Clinical Validation Study to Predict Recurrence Using a Circulating Tumor DNA Assay to Detect Minimal Residual Disease  ZOX#[id number]  Baseline Weight: 130.3 kg  Today's Visit: Baseline Blood Draw (V01)        Participant present for Baseline Blood Draw (V01) visit. There were no adverse events associated with the lab draw. Blood was drawn via port.     Participant was provided ClinCard.     Next due lab draw is 11/16/2022 +/- 30 days.     Participant will return to clinic for in 90 days for lab draw.

## 2022-08-18 NOTE — Progress Notes
1040  New port accessed using 1 bent huber needle.  Good blood return noted & study labs drawn.  Patient reports worked herself up too much for the new port accessing.  Reports feels a little nauseated & hot.  Pt given fan with bottle of water & placed in reclined position.  Zoladex injection subcut to LLQ of abd given while reclined & tolerated well. Pt refuses lidocaine injection due to reports abd is numb due to surgery.  Reports is feeling fine after injection.  Pt dismissed with husband to drive pt home.

## 2022-08-31 ENCOUNTER — Encounter: Admit: 2022-08-31 | Discharge: 2022-08-31 | Payer: 59

## 2022-08-31 DIAGNOSIS — T7840XA Allergy, unspecified, initial encounter: Secondary | ICD-10-CM

## 2022-08-31 DIAGNOSIS — F419 Anxiety disorder, unspecified: Secondary | ICD-10-CM

## 2022-08-31 DIAGNOSIS — C50919 Malignant neoplasm of unspecified site of unspecified female breast: Secondary | ICD-10-CM

## 2022-08-31 DIAGNOSIS — E039 Hypothyroidism, unspecified: Secondary | ICD-10-CM

## 2022-08-31 DIAGNOSIS — E669 Obesity, unspecified: Secondary | ICD-10-CM

## 2022-08-31 DIAGNOSIS — Z923 Personal history of irradiation: Secondary | ICD-10-CM

## 2022-08-31 DIAGNOSIS — C186 Malignant neoplasm of descending colon: Secondary | ICD-10-CM

## 2022-08-31 DIAGNOSIS — J302 Other seasonal allergic rhinitis: Secondary | ICD-10-CM

## 2022-08-31 DIAGNOSIS — C189 Malignant neoplasm of colon, unspecified: Secondary | ICD-10-CM

## 2022-08-31 DIAGNOSIS — F3289 Other specified depressive episodes: Secondary | ICD-10-CM

## 2022-08-31 DIAGNOSIS — Z789 Other specified health status: Secondary | ICD-10-CM

## 2022-08-31 DIAGNOSIS — E282 Polycystic ovarian syndrome: Secondary | ICD-10-CM

## 2022-08-31 DIAGNOSIS — E785 Hyperlipidemia, unspecified: Secondary | ICD-10-CM

## 2022-08-31 DIAGNOSIS — R000 Tachycardia, unspecified: Secondary | ICD-10-CM

## 2022-08-31 DIAGNOSIS — I10 Essential (primary) hypertension: Secondary | ICD-10-CM

## 2022-08-31 DIAGNOSIS — F32A Depression: Secondary | ICD-10-CM

## 2022-08-31 DIAGNOSIS — R12 Heartburn: Secondary | ICD-10-CM

## 2022-08-31 DIAGNOSIS — E88819 Insulin resistance: Secondary | ICD-10-CM

## 2022-08-31 LAB — CBC AND DIFF
ABSOLUTE BASO COUNT: 0.1 10*3/uL (ref 0–0.20)
ABSOLUTE EOS COUNT: 0.4 10*3/uL (ref 0–0.45)
ABSOLUTE LYMPH COUNT: 1.1 10*3/uL (ref 1.0–4.8)
ABSOLUTE MONO COUNT: 0.4 10*3/uL (ref 0–0.80)
ABSOLUTE NEUTROPHIL: 4 10*3/uL (ref 1.8–7.0)
BASOPHILS %: 1 % (ref 0–2)
EOSINOPHILS %: 6 % — ABNORMAL HIGH (ref >60)
HEMATOCRIT: 30 % — ABNORMAL LOW (ref 36–45)
HEMOGLOBIN: 9.7 g/dL — ABNORMAL LOW (ref 12.0–15.0)
LYMPHOCYTES %: 18 % — ABNORMAL LOW (ref 24–44)
MCH: 26 pg (ref 26–34)
MCHC: 32 g/dL (ref 32.0–36.0)
MCV: 80 FL (ref 80–100)
MONOCYTES %: 6 % (ref 4–12)
MPV: 6.9 FL — ABNORMAL LOW (ref 7–11)
NEUTROPHILS %: 69 % (ref 41–77)
PLATELET COUNT: 291 10*3/uL (ref 150–400)
RBC COUNT: 3.7 M/UL — ABNORMAL LOW (ref 4.0–5.0)
RDW: 17 % — ABNORMAL HIGH (ref 11–15)
WBC COUNT: 5.9 10*3/uL (ref 4.5–11.0)

## 2022-08-31 LAB — TSH WITH FREE T4 REFLEX: TSH: 2.6 uU/mL (ref 0.35–5.00)

## 2022-08-31 LAB — COMPREHENSIVE METABOLIC PANEL
POTASSIUM: 4 MMOL/L (ref 3.5–5.1)
SODIUM: 139 MMOL/L (ref 137–147)

## 2022-08-31 LAB — CEA(CARCINOEMBRYONIC AG): CEA: 0.3 ng/mL (ref <3.0)

## 2022-08-31 MED ORDER — LORAZEPAM 0.5 MG PO TAB
ORAL_TABLET | ORAL | 3 refills | 12.00000 days | Status: AC
Start: 2022-08-31 — End: ?

## 2022-08-31 MED ORDER — DEXAMETHASONE 4 MG PO TAB
4 mg | Freq: Once | ORAL | 0 refills | Status: CP
Start: 2022-08-31 — End: ?
  Administered 2022-08-31: 19:00:00 4 mg via ORAL

## 2022-08-31 MED ORDER — OXALIPLATIN IVPB
85 mg/m2 | Freq: Once | INTRAVENOUS | 0 refills | Status: CP
Start: 2022-08-31 — End: ?
  Administered 2022-08-31 (×2): 187.85 mg via INTRAVENOUS

## 2022-08-31 MED ORDER — PALONOSETRON 0.25 MG/5 ML IV SOLN
.25 mg | Freq: Once | INTRAVENOUS | 0 refills | Status: CP
Start: 2022-08-31 — End: ?
  Administered 2022-08-31: 19:00:00 0.25 mg via INTRAVENOUS

## 2022-08-31 MED ORDER — SODIUM CHLORIDE 0.9 % IV SOLP
1000 mL | Freq: Once | INTRAVENOUS | 0 refills | Status: CP
Start: 2022-08-31 — End: ?
  Administered 2022-08-31: 18:00:00 1000 mL via INTRAVENOUS

## 2022-08-31 MED ORDER — FLUOROURACIL IV AMB PUMP
2000 mg/m2 | Freq: Once | INTRAVENOUS | 0 refills | Status: CP
Start: 2022-08-31 — End: ?
  Administered 2022-08-31 (×4): 4420 mg via INTRAVENOUS

## 2022-08-31 MED ORDER — LIDOCAINE-PRILOCAINE 2.5-2.5 % TP CREA
3 refills | Status: AC
Start: 2022-08-31 — End: ?

## 2022-08-31 NOTE — Progress Notes
Cancer Therapeutics Note  Verified consent signed and in chart.    Blood return positive via: Port (Single)    BSA and dose double checked (agree with orders as written) with: yes      Labs/applicable tests checked: CBC and Comprehensive Metabolic Panel (CMP)    Treatment regimen: Drug/cycle/day C1D1 Oxaliplatin/ Adrucil Pump    Rate verified and armband double check with second RN: yes     Patient education offered and stated understanding. Denies questions at this time.    Patient educated on pump use and verbalized understanding. Patient given spill kit, educated on use, and verbalized understanding. Patient pump attached and infusing. Patient home with pump.

## 2022-08-31 NOTE — Patient Instructions
[facility]  Chemotherapy Instructions    Jennifer Durham 08/31/2022    Chemotherapy Drugs:    Oxaliplatin  Adrucil pump    Scheduled Medications:  Normal Saline  Dexamethasone       Call Immediately to report the following:  Unexplained bleeding or bleeding that will not stop  Difficulty swallowing  Shortness of breath, wheezing, or trouble breathing  Rapid, irregular heartbeat; chest pain  Dizziness, lightheadedness  Rash or cut that swells or turns red, feels hot or painful, or begin to ooze  Diarrhea   Uncontrolled nausea or vomiting  Fever of 100.4 F or higher, or chills    Important Phone Numbers:  Cancer Center Main Number (answered 24 hours a day) [phone]  Cancer Center Scheduling (appointments) [phone]  Social Worker [phone]  Nutritionist [phone] Nausea and Vomiting  Nausea is having a sick or queasy feeling in the stomach, and vomiting is throwing up food or liquids from the stomach. Nausea can occur even when a person is not thinking about food. A person can vomit even if he or she has not eaten anything and hasn?t had any nausea. Nausea or vomiting can be caused by eating something that disagrees with you, bacteria in food, infections, or by the radiation or chemo treatments for cancer. But many people have little or no nausea and vomiting with these treatments. For others, just thinking about going for one of the treatments can cause nausea or vomiting. Cancer by itself may cause nausea and vomiting.   Frequent vomiting can be dangerous because it can lead to dehydration. It can also cause choking or inhaling food or liquids. Talk with your doctor about what is causing your nausea and vomiting and what you can do about it.   What to look for   Changes in eating habits   Foul mouth odor   Yellow or green foul-smelling fluids on bedclothes   Feeling queasy or stomach upset   Increased saliva, clamminess, and sweating may come before vomiting   What the patient can do   For nausea:   Eat bland foods, such as dry toast and crackers.   If the nausea only happens between meals, eat frequent, small meals and have a snack at bedtime.   Drink clear liquids served cold and sipped slowly. (Clear liquids are those that you can see through, such as ginger ale, apple juice, broth, tea, etc.) Also try popsicles or gelatin.   Seek out the foods you like. Many people develop a dislike for red meat and meat broths during treatment. Try other protein sources such as fish, chicken, beans, and nuts.   Suck on hard candy with pleasant smells, such as lemon drops or mints, to help get rid of bad tastes.   Eat food cold or at room temperature to decrease its smell and taste. Avoid fatty, fried, spicy, or very sweet foods.   Try small amounts of foods high in calories that are easy to eat (pudding, ice cream, sherbets, yogurt, milkshakes) several times a day. Use butter, oils, syrups, sauces, and milk in foods to raise calories. Avoid low-fat foods unless fats upset your stomach or cause other problems.   Tart or sour foods may be easier to keep down (unless you have mouth sores).   Try to rest quietly while sitting upright for at least an hour after each meal.   Distract yourself with soft music, a favorite television program, or the  company of others.   If you have nausea, relax and take slow deep breaths.   Tell your doctor about the nausea because there are several drugs that can help it.   Take your anti-nausea medicine at the first signs of nausea to help prevent vomiting.   If nausea occurs just before chemo or doctor visits, ask about medicines, hypnosis, relaxation or behavioral treatment to lessen this problem.     For vomiting:   If you are in bed, lie on your side so that vomit will not be inhaled.   Request that medicines be prescribed in suppository form, if possible. Take medicine at the first hint of nausea to prevent vomiting.   Try liquids in the form of ice chips or frozen juice chips that can be taken slowly.   After vomiting stops, begin by taking in 1 teaspoon of cool liquid every 10 minutes. Gradually increase to 1 tablespoon every half hour. If you are able to keep that down after an hour or so, try larger amounts.   What caregivers can do   Make meals or ask others to make meals during times the patient is nauseated. Use kitchen vent fans to reduce smells.   Cover or remove foods with strong or unpleasant smells.   Try plastic forks and spoons rather than metal ones, which may cause a bitter taste.   If the patient starts vomiting, weigh him or her at the same time each day, to help decide whether dehydration is getting severe.   Talk to the doctor about medicines to help prevent vomiting.   Watch the patient for dizziness, weakness, or confusion.   Try to help the patient avoid constipation and dehydration. Either of these can make nausea worse.   Call the doctor if the patient:   may have inhaled some of the vomited material   vomits more than 3 times an hour for 3 or more hours   vomits blood or material that looks like coffee grounds   cannot take in more than 4 cups of liquid or ice chips in a day or cannot eat substantial foods for more than 2 days    cannot take his or her medicines   becomes weak, dizzy, or confused   loses 2 or more lbs in 1 to 2 days (This means he or she is losing too much water.)   develops dark yellow urine, and doesn?t have to urinate as often as usual  [employer] www.cancer.org FATIGUE  Fatigue is when a person has less energy to do the things he or she normally does, or wants to do. Fatigue is the most common side effect of cancer treatment. This fatigue is different from that of everyday life. Fatigue related to cancer treatment can appear suddenly and can be overwhelming. It is not relieved by rest. It can last for months after treatment ends. This type of fatigue can affect many aspects of a person?s life, including the ability to do his usual activities.   Cancer fatigue is real and should not be ignored. It can be worse when a person is dehydrated, anemic, in pain, not sleeping well, or has an infection. Recent studies have shown that exercise programs during treatment can help reduce fatigue.   What to Look For   Feeling like you have no energy   Sleeping more than normal   Not wanting to or not being able to do normal activities   Less attention to personal appearance  Feeling tired even after sleeping   Trouble thinking or concentrating   Trouble finding words and speaking   What the Patient Can Do   Balance rest and activities.   Tell the doctor if you?re not able to get around as well as usual.   Plan your important activities for when you have the most energy.   Schedule important activities throughout the day rather than all at once.   Get enough rest and sleep. Short naps and rest breaks may be needed.   Remember that fatigue caused by treatment is short-term and that energy will slowly get better after treatment has ended.   Ask others to help you by cooking meals and doing housework, yard work, and errands.   Eat a balanced diet that includes protein (meat, eggs, cheese, and legumes such as peas and beans) and drink about 8 to 10 glasses of water a day, unless your care team gives you other instructions.   What Caregivers Can Do   Help schedule friends and family members to prepare meals, clean house, do yard work, or run errands for the patient.   Try not to push the patient to do more than he or she is able.   Help the patient to set up a routine for activities during the day.   Call the doctor if the patient:   is too tired to get out of bed for more than a 24-hour period   becomes confused   has fatigue that keeps getting worse   feels out of breath or has a racing heart after just a little activity  [employer] www.cancer.org

## 2022-08-31 NOTE — Progress Notes
Chemotherapy Education    Provided written and verbal education regarding FOLFOX chemotherapy (5-fluorouracil + leucovorin + oxaliplatin).    Reviewed chemotherapy schedule. Patient will receive chemotherapy as follows:  Oxaliplatin as 2 hour infusion on Day 1  5-fluorouracil bolus over 2-4 minutes and continuous infusion over 46 hours (Days 1 & 2)     Potential adverse effects include (but are not limited to):  Low blood counts (explained associated risk for infection, bleeding, bruising, fatigue)   Nausea and vomiting (explained purpose of scheduled antiemetics before chemotherapy and the use of PRNs in the event of breakthrough CINV)   Diarrhea (advised patient to not take any OTC medications without first consulting with the health care team)   Mouth sores   Dermatologic effects such as skin darkening or potential for hand and foot syndrome related to fluorouracil (skin rash, swelling, redness, pain and/or peeling of the skin on the palms of hands and soles of feet - advised patient to maintain open lines of communication with providers so that dose adjustments or interventions can be made if warranted)   Laboratory abnormalities (advised patient that the health care team will be monitoring these values closely)   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    Severe allergic reactions are a rare but serious side effect of oxaliplatin. Informed patient to notify their nurse immediately if they notice any flushing, rash, itching, chest pain, difficulty breathing, or dizziness during the infusion. Delayed reactions may also occur up to an hour or longer after the infusion is complete. Patient should seek medication attention right away by calling 9-1-1 if they notice difficulty breathing, swelling of the mouth or tongue, or a serious rash after leaving the infusion center.    Toxicity of the heart is a rare but serious side effect with fluorouracil. Informed patient to seek medical attention right away if they experience any chest pain or tightness.    A medication history and reconciliation was performed (including prescription medications, supplements, over the counter medications, and herbal products). The medication list was updated. Drug-drug and drug-food interactions with the new therapy were assessed and reviewed with the patient. No significant drug-drug or drug-food interactions were identified.  Minor interaction with fluorouracil and escitalopram for Qtc prolongation; however benefits greatly outweigh the risks and appropriate cardiac monitoring is planned.     Patient voiced understanding about the provided information. All questions/concerns addressed at this time. Medication handout(s) provided.    Jennifer Durham, [facility]

## 2022-09-02 ENCOUNTER — Encounter: Admit: 2022-09-02 | Discharge: 2022-09-02 | Payer: 59

## 2022-09-02 DIAGNOSIS — C186 Malignant neoplasm of descending colon: Secondary | ICD-10-CM

## 2022-09-02 NOTE — Patient Instructions
[facility]  Chemotherapy Instructions    [name] 09/02/2022    Treatment Received Today:  Pump DC        Scheduled Medications:    As Needed Medications:          Call Immediately to report the following:  Unexplained bleeding or bleeding that will not stop  Difficulty swallowing  Shortness of breath, wheezing, or trouble breathing  Rapid, irregular heartbeat; chest pain  Dizziness, lightheadedness  Rash or cut that swells or turns red, feels hot or painful, or begin to ooze  Diarrhea   Uncontrolled nausea or vomiting  Fever of 100.4 F or higher, or chills    Important Phone Numbers:  Cancer Center Main Number (answered 24 hours a day) [phone]  Cancer Center Scheduling (appointments) [phone]  Social Worker [phone]

## 2022-09-02 NOTE — Progress Notes
Patient arrived to CC treatment for pump removal. Assessment completed on patient. See doc flow sheet for full assessment. 0.48mL bolused prior to pump removal. Pump removed. Port positive for blood return, flushed with saline, and then de-accessed. Patient left CC treatment in stable condition.

## 2022-09-09 ENCOUNTER — Encounter: Admit: 2022-09-09 | Discharge: 2022-09-09 | Payer: 59

## 2022-09-09 DIAGNOSIS — C186 Malignant neoplasm of descending colon: Secondary | ICD-10-CM

## 2022-09-10 ENCOUNTER — Encounter: Admit: 2022-09-10 | Discharge: 2022-09-10 | Payer: 59

## 2022-09-12 ENCOUNTER — Encounter: Admit: 2022-09-12 | Discharge: 2022-09-12 | Payer: 59

## 2022-09-12 DIAGNOSIS — C186 Malignant neoplasm of descending colon: Secondary | ICD-10-CM

## 2022-09-12 MED ORDER — DEXAMETHASONE 4 MG PO TAB
4 mg | Freq: Once | ORAL | 0 refills | Status: CN
Start: 2022-09-12 — End: ?

## 2022-09-12 MED ORDER — FLUOROURACIL IV AMB PUMP
2000 mg/m2 | Freq: Once | INTRAVENOUS | 0 refills
Start: 2022-09-12 — End: ?

## 2022-09-12 MED ORDER — PALONOSETRON 0.25 MG/5 ML IV SOLN
.25 mg | Freq: Once | INTRAVENOUS | 0 refills
Start: 2022-09-12 — End: ?

## 2022-09-12 MED ORDER — DEXAMETHASONE 4 MG PO TAB
4 mg | Freq: Once | ORAL | 0 refills
Start: 2022-09-12 — End: ?

## 2022-09-12 MED ORDER — FLUOROURACIL IV AMB PUMP
2000 mg/m2 | Freq: Once | INTRAVENOUS | 0 refills | Status: CN
Start: 2022-09-12 — End: ?

## 2022-09-12 MED ORDER — PALONOSETRON 0.25 MG/5 ML IV SOLN
.25 mg | Freq: Once | INTRAVENOUS | 0 refills | Status: CN
Start: 2022-09-12 — End: ?

## 2022-09-14 ENCOUNTER — Encounter: Admit: 2022-09-14 | Discharge: 2022-09-14 | Payer: 59

## 2022-09-14 ENCOUNTER — Encounter: Admit: 2022-09-14 | Discharge: 2022-09-15 | Payer: 59

## 2022-09-14 DIAGNOSIS — Z79818 Long term (current) use of other agents affecting estrogen receptors and estrogen levels: Secondary | ICD-10-CM

## 2022-09-14 DIAGNOSIS — D127 Benign neoplasm of rectosigmoid junction: Secondary | ICD-10-CM

## 2022-09-14 DIAGNOSIS — C186 Malignant neoplasm of descending colon: Secondary | ICD-10-CM

## 2022-09-14 DIAGNOSIS — Z9013 Acquired absence of bilateral breasts and nipples: Secondary | ICD-10-CM

## 2022-09-14 DIAGNOSIS — F32A Depression: Secondary | ICD-10-CM

## 2022-09-14 DIAGNOSIS — I10 Essential (primary) hypertension: Secondary | ICD-10-CM

## 2022-09-14 DIAGNOSIS — F419 Anxiety disorder, unspecified: Secondary | ICD-10-CM

## 2022-09-14 DIAGNOSIS — J302 Other seasonal allergic rhinitis: Secondary | ICD-10-CM

## 2022-09-14 DIAGNOSIS — R42 Dizziness and giddiness: Secondary | ICD-10-CM

## 2022-09-14 DIAGNOSIS — Z170 Estrogen receptor positive status [ER+]: Secondary | ICD-10-CM

## 2022-09-14 DIAGNOSIS — C50411 Malignant neoplasm of upper-outer quadrant of right female breast: Secondary | ICD-10-CM

## 2022-09-14 DIAGNOSIS — C773 Secondary and unspecified malignant neoplasm of axilla and upper limb lymph nodes: Secondary | ICD-10-CM

## 2022-09-14 DIAGNOSIS — Z79810 Long term (current) use of selective estrogen receptor modulators (SERMs): Secondary | ICD-10-CM

## 2022-09-14 DIAGNOSIS — Z923 Personal history of irradiation: Secondary | ICD-10-CM

## 2022-09-14 DIAGNOSIS — E88819 Insulin resistance: Secondary | ICD-10-CM

## 2022-09-14 DIAGNOSIS — T7840XA Allergy, unspecified, initial encounter: Secondary | ICD-10-CM

## 2022-09-14 DIAGNOSIS — R12 Heartburn: Secondary | ICD-10-CM

## 2022-09-14 DIAGNOSIS — C189 Malignant neoplasm of colon, unspecified: Secondary | ICD-10-CM

## 2022-09-14 DIAGNOSIS — E785 Hyperlipidemia, unspecified: Secondary | ICD-10-CM

## 2022-09-14 DIAGNOSIS — E282 Polycystic ovarian syndrome: Secondary | ICD-10-CM

## 2022-09-14 DIAGNOSIS — E039 Hypothyroidism, unspecified: Secondary | ICD-10-CM

## 2022-09-14 DIAGNOSIS — E669 Obesity, unspecified: Secondary | ICD-10-CM

## 2022-09-14 DIAGNOSIS — Z789 Other specified health status: Secondary | ICD-10-CM

## 2022-09-14 DIAGNOSIS — F3289 Other specified depressive episodes: Secondary | ICD-10-CM

## 2022-09-14 DIAGNOSIS — C50919 Malignant neoplasm of unspecified site of unspecified female breast: Secondary | ICD-10-CM

## 2022-09-14 DIAGNOSIS — R000 Tachycardia, unspecified: Secondary | ICD-10-CM

## 2022-09-14 LAB — CEA(CARCINOEMBRYONIC AG): CEA: 0.3 ng/mL (ref <3.0)

## 2022-09-14 LAB — COMPREHENSIVE METABOLIC PANEL
POTASSIUM: 4.1 MMOL/L (ref 3.5–5.1)
SODIUM: 139 MMOL/L (ref 137–147)

## 2022-09-14 LAB — CBC AND DIFF
RBC COUNT: 3.5 M/UL — ABNORMAL LOW (ref 4.0–5.0)
WBC COUNT: 6.3 10*3/uL (ref 4.5–11.0)

## 2022-09-14 MED ORDER — DEXAMETHASONE 4 MG PO TAB
4 mg | Freq: Once | ORAL | 0 refills | Status: CP
Start: 2022-09-14 — End: ?
  Administered 2022-09-14: 18:00:00 4 mg via ORAL

## 2022-09-14 MED ORDER — SODIUM CHLORIDE 0.9 % IV SOLP
1000 mL | Freq: Once | INTRAVENOUS | 0 refills | Status: CP
Start: 2022-09-14 — End: ?
  Administered 2022-09-14: 18:00:00 1000 mL via INTRAVENOUS

## 2022-09-14 MED ORDER — SODIUM CHLORIDE 0.9 % IV SOLP
1000 mL | Freq: Once | INTRAVENOUS | 0 refills | Status: CN
Start: 2022-09-14 — End: ?

## 2022-09-14 MED ORDER — FLUOROURACIL IV AMB PUMP
2000 mg/m2 | Freq: Once | INTRAVENOUS | 0 refills | Status: CP
Start: 2022-09-14 — End: ?
  Administered 2022-09-14 (×3): 4420 mg via INTRAVENOUS

## 2022-09-14 MED ORDER — PALONOSETRON 0.25 MG/5 ML IV SOLN
.25 mg | Freq: Once | INTRAVENOUS | 0 refills | Status: CP
Start: 2022-09-14 — End: ?
  Administered 2022-09-14: 18:00:00 0.25 mg via INTRAVENOUS

## 2022-09-14 MED ORDER — OXALIPLATIN IVPB
85 mg/m2 | Freq: Once | INTRAVENOUS | 0 refills | Status: CP
Start: 2022-09-14 — End: ?
  Administered 2022-09-14 (×2): 187.85 mg via INTRAVENOUS

## 2022-09-14 NOTE — Progress Notes
Cancer Therapeutics Note  Verified consent signed and in chart.    Blood return positive via: Port (Single, Power Port, and Accessed)    BSA and dose double checked (agree with orders as written) with: yes see MAR    Labs/applicable tests checked: CBC and Comprehensive Metabolic Panel (CMP)    Treatment regimen: C2D1    oxaliplatin (ELOXATIN) 187.85 mg in dextrose 5% (D5W) 287.57 mL IVPB    fluorouraciL (ADRUCIL) 4,420 mg in sodium chloride 0.9% (NS) 92 mL IV amb pump      Rate verified and armband double check with second RN: yes    Patient education offered and stated understanding. Denies questions at this time.    Patient arrived to CC treatment after being seen in clinic with Dr. Wynelle Link; please refer to clinic note for assessment details. Premeds given per treatment plan. Oxaliplatin given w/o complications, patient tolerated well.  Port positive for blood return, flushed with saline, and currently infusing 5FU via ambulatory pump. All connections secure, tubing unclamped, and face of pump reads infusing. Patient declined copy of labs and AVS. All questions and concerns addressed. Patient left CC treatment in stable condition.

## 2022-09-14 NOTE — Progress Notes
Name: Jennifer Durham          MRN: [id number]      DOB: Apr 25, 1975      AGE: 47 y.o.   DATE OF SERVICE: 09/14/2022    Subjective:             Reason for Visit:  Follow Up      Jennifer Durham is a 47 y.o. female.      Cancer Staging   Colon cancer [facility])  Staging form: Colon And Rectum, AJCC 8th Edition  - Clinical stage from 07/23/2022: Stage IIIB (cT3, cN2a, cM0) - Signed by Miguel Rota, MD on 08/11/2022    Malignant neoplasm of upper-outer quadrant of right breast in female, estrogen receptor positive (HCC)  Staging form: Breast, AJCC 8th Edition  - Clinical stage from 07/04/2019: Stage IA (cT1c, cN0(f), cM0, G2, ER+, PR+, HER2-) - Signed by Guy Begin, PA-C on 07/12/2019  - Pathologic stage from 08/21/2019: Stage IA (pT1c, pN1a, cM0, G2, ER+, PR+, HER2-) - Signed by Guy Begin, PA-C on 08/30/2019      History of Present Illness    47 y.o. female with hx of stage IA ER/PR+, HER2- IDC of the right breast diagnosed in 06/2019 after detecting dimpling of her breast in 05/2019. She presented for mammogram which identified a spiculated mass in the upper right medial breast. She underwent ultrasound and biopsy that confirmed carcinoma . Pathology review at [facility] confirmed grade 2 IDC with a focal lobular growth pattern and lymphoid tissue with no evidence of carcinoma in the right axillary specimen. She was recommended to undergo upfront surgery. On 08/01/2019, she underwent bilateral skin sparing mastectomy and right sentinel lymph node biopsy, axilla lymph node dissection, and reconstruction.  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  A 5 mm focus of metastatic carcinoma was found in 1 of 4 sentinel lymph nodes.  0 of 27 additional axillary nodes were involved. pT1c N1a M0 (1 of 34 LN+) M0. No LVI, No EIC, No ENE. She was recommended to receive adjuvant chemotherapy with TC x4 cycles,  underwent adjuvant radiotherapy to her right reconstructed breast, chest wall and regional nodes to a dose of 4526 cGy in 16 fractions from 02/19/2020 - 03/11/2020.      She is on tamoxifen and zoladex.     history of hypertension, polycystic ovarian disease, and prediabetesa colonoscopy     She had a colonoscopy performed on May 29th, 2024. The colonoscopy revealed a moderately differentiated adenocarcinoma suspected to be at the splenic flexure, 60 cm from the anal verge, and a large rectosigmoid polyp described as pedunculated. The polyp was biopsied but not removed and was identified as a tubulovillous adenoma. CT of the chest, abdomen, and pelvis, which showed no evidence of metastatic disease. The patient's CEA level was 0.8, and she was found to be microsatellite stable.     07/23/2022  Extended left hemi colectomy: pT3N2 (LN 5/48) mod diff adenocarcinoma, Lymphatic and / or Vascular Invasion: Present; Perineural Invasion--Present        Here for consultation about adjuvant chemotherapy. Overall recovering from surgery reasonable well, only complaint is mild intermittent lightheadedness, no F/C/N/V/D, no pain, no SOB, no DOE.     Accompanied by her husband.        Interval History, 8/20  Patient presented for consideration of adjuvant mFOLFOX6, cycle 2 today. She doing well, had some mild fatigue,  and dizziness if she stands up from sitting or laying down quickly. Her clinic visit BP is 89/70      Review of Systems 12 system reviewed negative except above stated    Mother had polyps  2 siblings: age 49, 32; 2 biologic children: 55, and 53 yrs old     Objective:          carvediloL (COREG) 6.25 mg tablet Take one tablet by mouth twice daily with meals. Take with food.    CHOLEcalciferoL (vitamin D3) (OPTIMAL D3) 50,000 units capsule Take one capsule by mouth every 7 days. Sundays    ciclopirox (PENLAC) 8 % topical solution Apply topically to entire affected nail(s) once daily.  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    escitalopram oxalate (LEXAPRO) 10 mg tablet TAKE ONE TABLET BY MOUTH EVERY DAY    ferrous sulfate (IRON) 325 mg (65 mg iron) tablet Take one tablet by mouth daily. Take on an empty stomach at least 1 hour before or 2 hours after food.    INTRAUTERINE DEVICE (IUD) IU by Intrauterine route.    levothyroxine (SYNTHROID) 50 mcg tablet Take one tablet by mouth daily.    lidocaine/prilocaine (EMLA) 2.5/2.5 % topical cream Apply pea sized amount of cream in a thick layer over port site 45 minutes prior to access, cover with transparent dressing    loperamide (IMODIUM A-D) 2 mg tablet 1 tablet 30-45 minutes prior to breakfast, lunch, and dinner    LORazepam (ATIVAN) 0.5 mg tablet Take 1 tab by mouth every 6 hrs as needed N/V not controlled by Zofran or Compazine. May also use every 6 hrs as needed anxiety or at bedtime insomnia.    losartan (COZAAR) 50 mg tablet Take one tablet by mouth daily.    metFORMIN (GLUCOPHAGE) 1,000 mg tablet Take one tablet by mouth daily with breakfast.    montelukast (SINGULAIR) 10 mg tablet Take one tablet by mouth at bedtime daily.    ondansetron HCL (ZOFRAN) 8 mg tablet Take one tablet by mouth every 8 hours as needed for Nausea or Vomiting. Avoid on Days 1-2 of each chemotherapy cycle.    prochlorperazine maleate (COMPAZINE) 10 mg tablet Take one tablet by mouth every 6 hours as needed for Nausea or Vomiting.    rosuvastatin (CRESTOR) 5 mg tablet Take one tablet by mouth daily.    tamoxifen (NOLVADEX) 20 mg tablet TAKE ONE TABLET BY MOUTH EVERY NIGHT AT BEDTIME    tirzepatide (MOUNJARO) 7.5 mg/0.5 mL injector PEN Inject 0.5 mL under the skin every 7 days. wednesday    traMADoL (ULTRAM) 50 mg tablet Take one tablet to two tablets by mouth every 6 hours as needed. Indications: pain     Vitals:    09/14/22 0936   BP: (!) 89/70   BP Source: Arm, Left Upper   Pulse: 88   Temp: 36.7 ?C (98 ?F)   Resp: 16   SpO2: 97%   TempSrc: Temporal   PainSc: Zero   Weight: 128.9 kg (284 lb 3.2 oz)   Height: 181.8 cm (5' 11.58)     Body mass index is 39 kg/m?Marland Kitchen     Pain Score: Zero       Fatigue Scale: 5    Pain Addressed:  N/A    Patient Evaluated for a Clinical Trial: Patient  currently in screening for a treatment clinical trial.     [place] Cooperative Oncology Group performance status is 0, Fully active, able to carry on all pre-disease performance without restriction.Marland Kitchen     Physical Exam  Constitutional:       Appearance: Normal appearance.   HENT:      Mouth/Throat:      Mouth: Mucous membranes are moist.   Eyes:      Extraocular Movements: Extraocular movements intact.      Conjunctiva/sclera: Conjunctivae normal.      Pupils: Pupils are equal, round, and reactive to light.   Cardiovascular:      Rate and Rhythm: Normal rate and regular rhythm.      Pulses: Normal pulses.      Heart sounds: Normal heart sounds.   Pulmonary:      Effort: Pulmonary effort is normal.      Breath sounds: Normal breath sounds.   Abdominal:      General: Abdomen is flat. There is no distension.      Palpations: Abdomen is soft.      Tenderness: There is no abdominal tenderness.   Musculoskeletal:      Cervical back: Normal range of motion and neck supple.   Skin:     General: Skin is warm.   Neurological:      General: No focal deficit present.      Mental Status: She is alert and oriented to person, place, and time.   Psychiatric:         Mood and Affect: Mood normal.         Behavior: Behavior normal.          07/23/2022  Pathology    Final Diagnosis:     A. 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      B. Colon, extended left hemi colectomy:    Adenocarcinoma moderately differentiated. See comment.   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 Additional proximal margin#1, excision:   Colon tissue with Tubular adenomas. See comment.   Three lymph nodes negative for metastatic carcinoma (0/3).     D. Additional proximal margins #2, excision:    Tubulovillous adenoma with focal high-grade dysplasia   Eleven lymph nodes negative for metastatic carcinoma (0/11).       Comment:   Pursuant to the Quality Assurance Program at the [facility] Pathology Department, selected slides from this case have been   concurrently reviewed by the following pathologist: Dr. Juanetta Beets   who   agrees with the final diagnosis.       CASE SUMMARY: (COLON AND RECTUM: Resection)   Standard(s): AJCC-UICC 8   CAP Version: Colon and Rectum Resection 4.3.0.0     SPECIMEN     Procedure   ___ Subtotal (extended) left abdominal colectomy     Macroscopic Evaluation of Mesorectum (required for rectal cancers)   ___ Not applicable     TUMOR     Tumor Site (select all that apply)   ___ Transverse colon/Splenic flexure     Histologic Type   ___ Adenocarcinoma     Histologic Grade   ___ G2, moderately differentiated     Tumor Size   ___ Greatest dimension in Centimeters (cm): 4.0 x 3.8 x 0.7 cm  Multiple Primary Sites (e.g., hepatic flexure and transverse colon)   ___ Not applicable (no additional primary site(s) present)     Tumor Extent   ___ Invades through muscularis propria into the pericolonic or perirectal   tissue     Sub-mucosal Invasion (required only for pT1 tumors)   ___ Not applicable (not a pT1 tumor)     Macroscopic Tumor Perforation   ___ Not identified     Lymphatic and / or Vascular Invasion(select all that apply)   ___ Present (not otherwise specified)     Perineural Invasion   ___ Present     Tumor Budding Score   ___ Intermediate (5-9)     Treatment Effect   ___ No known presurgical therapy     MARGINS     Margin Status for Invasive Carcinoma   ___ All margins negative for invasive carcinoma   +Closest Margin(s) to Invasive Carcinoma (select all that apply)   ___ Radial (circumferential)/Mesenteric   +Distance from Invasive Carcinoma to Closest Margin   Specify in Centimeters (cm)   ___ Exact distance in cm: 3.4 cm   Distance from Invasive Carcinoma to Radial (Circumferential) Margin   (required for rectal   tumors)   ___ Distance already reported as closest margin.   +Distance from Invasive Carcinoma to Distal Margin (recommended for rectal   tumors)   Specify in Centimeters (cm)   ___ Exact distance in cm: 58.0 cm   Margin(s) Involved by Invasive Carcinoma (select all that apply)   ___ Not applicable     Margin Status for Non-Invasive Tumor (select all that apply)   ___ All margins negative for high-grade dysplasia / intramucosal carcinoma   and low-grade dysplasia     REGIONAL LYMPH NODES     Regional Lymph Node Status   ___ Regional lymph nodes present   ___ Tumor present in regional lymph nodes   Number of Lymph Nodes with Tumor   ___ Exact number (specify): 5   Number of Lymph Nodes Examined   ___ Exact number (specify): 34     Tumor Deposits   ___ Not identified     DISTANT METASTASIS     Distant Site(s) Involved, if applicable (select all that apply)   ___ Not applicable     pTNM CLASSIFICATION (AJCC 8th Edition)   Reporting of pT, pN, and (when applicable) pM categories is based on   information available to the pathologist at the time the report is issued.   As per the AJCC (Chapter 1, 8th Ed.) it is the managing physician's   responsibility to establish the final pathologic stage based upon all   pertinent information, including but potentially not limited to this   pathology report.     Modified Classification (required only if applicable) (select all that   apply)   ___ Not applicable     pT Category   ___ pT3: Tumor invades through the muscularis propria into pericolorectal   tissues     T Suffix (required only if applicable)   ___ Not applicable     pN Category   pN2: Four or more regional nodes are positive ___ pN2a: Four to six regional lymph nodes are positive     pM Category (required only if confirmed pathologically)   ___ Not applicable - pM cannot be determined from the submitted   specimen(s)     ADDITIONAL FINDINGS     +Additional Findings (select all that apply)   ___ Adenoma(s) (Tubulovillous adenoma with  high grade dysplasia, 6.3 cm.   1.9 cm., rectosigmoid; tubular adenomas)        Assessment and Plan:  47 yr old female with history of right breast cancer (07/2019): ER/PR+, HER2- IDC pT1c N1a M0 (1 of 34 LN+) M0.  on  tamoxifen and zoladex. Who has newly diagnosed left colon adenocarcinoma s/P: pT3N2 (LN 5/48) mod diff , positive for  Lymphatic and / or Vascular Invasion, and Perineural Invasion.   Here for consultation about adjuvant chemotherapy. Overall recovering from surgery reasonable well, only complaint is mild intermittent lightheadedness, no F/C/N/V/D, no pain, no SOB, no DOE.   Accompanied by her husband.     PS ECOG 0    Reviewed images and pathology with the pt and husband about her stage of disease, the risk of MRD/recurrence of disease, the rationale/mechanism of adjuvant treatment, risk vs benefit, regimen of FOLFOX (a schedule, timing, length), and potential side effects. (Info given and consent done)   Also discussed the surveillance   We discussed the study of ctDNA (C-14), the pt is interested in    Patient is currently on mFOLFOX6, today is cycle 2, she is doing well generally, but reported some dizziness if she stands up from sitting or laying down quickly. Her clinic visit BP is 89/70. Her labs CBCD and CMP are within treatment parameter.    -- OK to proceed with mFOLFOX C2D1 today, will give 1L fluid before the treatment  -- continue to follow genetic consultation  -- continue follow breast cancer care team  -- follow with surgery team  -- call us for issues    RTC in 2 week for consideration of next cycle    The patient and family had a chance to ask questions, all been answered to their satisfaction.     Patient seen and discussed with Dr. Miguel Rota, MD, [facility]      Attending Note:    I saw and  examined the patient with Dr. Sena Hitch . I agree with the history, review of system, data,  and I am involved in and guided examination, and assessment/plan.     47 yr old female with history of right breast cancer (07/2019): ER/PR+, HER2- IDC pT1c N1a M0 (1 of 34 LN+) M0.  on  tamoxifen and zoladex. Who has newly diagnosed left colon adenocarcinoma s/P: pT3N2 (LN 5/48) mod diff , positive for  Lymphatic and / or Vascular Invasion, and Perineural Invasion.   Here for consultation about adjuvant chemotherapy. Overall recovering from surgery reasonable well, only complaint is mild intermittent lightheadedness, no F/C/N/V/D, no pain, no SOB, no DOE.   Accompanied by her husband.     PS ECOG 0    Reviewed images and pathology with the pt and husband about her stage of disease, the risk of MRD/recurrence of disease, the rationale/mechanism of adjuvant treatment, risk vs benefit, regimen of FOLFOX (a schedule, timing, length), and potential side effects. (Info given and consent done)   Also discussed the surveillance   We discussed the study of ctDNA (C-14), the pt is interested in    Patient is currently on mFOLFOX6, today is cycle 2, she is doing well generally, but reported some dizziness if she stands up from sitting or laying down quickly. Her clinic visit BP is 89/70. Her labs CBCD and CMP are within treatment parameter.    -- OK to proceed with mFOLFOX  -- will give 1L fluid hydration.  -- continue follow breast  cancer care team  -- follow with surgery team  -- call us for issues    RTC in 2 week for consideration of next cycle      The patient and family had a chance to ask questions, all been answered to their statisfaction.       Miguel Rota, MD, Jerrel Ivory

## 2022-09-16 ENCOUNTER — Encounter: Admit: 2022-09-16 | Discharge: 2022-09-16 | Payer: 59

## 2022-09-16 DIAGNOSIS — C186 Malignant neoplasm of descending colon: Secondary | ICD-10-CM

## 2022-09-16 NOTE — Progress Notes
Patient arrived to CC treatment for pump removal. Assessment completed on patient. See doc flow sheet for full assessment. 0.29mL bolused prior to pump removal. Pump removed. Port positive for blood return, flushed with saline, and then de-accessed. Patient left CC treatment in stable condition.

## 2022-09-16 NOTE — Patient Instructions
 Cancer Center  Chemotherapy Instructions    Jennifer Durham 09/16/2022    Treatment Received Today:  Pump DC        Scheduled Medications:    As Needed Medications:          Call Immediately to report the following:  Unexplained bleeding or bleeding that will not stop  Difficulty swallowing  Shortness of breath, wheezing, or trouble breathing  Rapid, irregular heartbeat; chest pain  Dizziness, lightheadedness  Rash or cut that swells or turns red, feels hot or painful, or begin to ooze  Diarrhea   Uncontrolled nausea or vomiting  Fever of 100.4 F or higher, or chills    Important Phone Numbers:  Cancer Center Main Number (answered 24 hours a day) [phone]  Cancer Center Scheduling (appointments) [phone]  Social Worker [phone]

## 2022-09-23 ENCOUNTER — Encounter: Admit: 2022-09-23 | Discharge: 2022-09-23 | Payer: 59

## 2022-09-28 ENCOUNTER — Encounter: Admit: 2022-09-28 | Discharge: 2022-09-28 | Payer: 59

## 2022-09-28 DIAGNOSIS — E785 Hyperlipidemia, unspecified: Secondary | ICD-10-CM

## 2022-09-28 DIAGNOSIS — E039 Hypothyroidism, unspecified: Secondary | ICD-10-CM

## 2022-09-28 DIAGNOSIS — F419 Anxiety disorder, unspecified: Secondary | ICD-10-CM

## 2022-09-28 DIAGNOSIS — C186 Malignant neoplasm of descending colon: Secondary | ICD-10-CM

## 2022-09-28 DIAGNOSIS — J302 Other seasonal allergic rhinitis: Secondary | ICD-10-CM

## 2022-09-28 DIAGNOSIS — R000 Tachycardia, unspecified: Secondary | ICD-10-CM

## 2022-09-28 DIAGNOSIS — T7840XA Allergy, unspecified, initial encounter: Secondary | ICD-10-CM

## 2022-09-28 DIAGNOSIS — E282 Polycystic ovarian syndrome: Secondary | ICD-10-CM

## 2022-09-28 DIAGNOSIS — I10 Essential (primary) hypertension: Secondary | ICD-10-CM

## 2022-09-28 DIAGNOSIS — C189 Malignant neoplasm of colon, unspecified: Secondary | ICD-10-CM

## 2022-09-28 DIAGNOSIS — Z923 Personal history of irradiation: Secondary | ICD-10-CM

## 2022-09-28 DIAGNOSIS — F32A Depression: Secondary | ICD-10-CM

## 2022-09-28 DIAGNOSIS — E669 Obesity, unspecified: Secondary | ICD-10-CM

## 2022-09-28 DIAGNOSIS — C50919 Malignant neoplasm of unspecified site of unspecified female breast: Secondary | ICD-10-CM

## 2022-09-28 DIAGNOSIS — E88819 Insulin resistance: Secondary | ICD-10-CM

## 2022-09-28 DIAGNOSIS — R12 Heartburn: Secondary | ICD-10-CM

## 2022-09-28 DIAGNOSIS — Z789 Other specified health status: Secondary | ICD-10-CM

## 2022-09-28 DIAGNOSIS — F3289 Other specified depressive episodes: Secondary | ICD-10-CM

## 2022-09-28 LAB — CEA(CARCINOEMBRYONIC AG): CEA: 0.4 ng/mL (ref <3.0)

## 2022-09-28 LAB — CBC AND DIFF
HEMATOCRIT: 30 % — ABNORMAL LOW (ref 36–45)
HEMOGLOBIN: 9.9 g/dL — ABNORMAL LOW (ref 12.0–15.0)
MCH: 26 pg (ref 26–34)
MCHC: 32 g/dL (ref 32.0–36.0)
MCV: 81 FL (ref 80–100)
MPV: 6.8 FL — ABNORMAL LOW (ref 7–11)
PLATELET COUNT: 230 10*3/uL (ref 150–400)
RBC COUNT: 3.7 M/UL — ABNORMAL LOW (ref 4.0–5.0)
RDW: 17 % — ABNORMAL HIGH (ref 11–15)
WBC COUNT: 7.2 10*3/uL (ref 4.5–11.0)

## 2022-09-28 MED ORDER — DEXAMETHASONE 4 MG PO TAB
4 mg | Freq: Once | ORAL | 0 refills | Status: CP
Start: 2022-09-28 — End: ?
  Administered 2022-09-28: 15:00:00 4 mg via ORAL

## 2022-09-28 MED ORDER — PALONOSETRON 0.25 MG/5 ML IV SOLN
.25 mg | Freq: Once | INTRAVENOUS | 0 refills | Status: CP
Start: 2022-09-28 — End: ?
  Administered 2022-09-28: 15:00:00 0.25 mg via INTRAVENOUS

## 2022-09-28 MED ORDER — FLUOROURACIL IV AMB PUMP
2000 mg/m2 | Freq: Once | INTRAVENOUS | 0 refills | Status: CP
Start: 2022-09-28 — End: ?
  Administered 2022-09-28 (×3): 4420 mg via INTRAVENOUS

## 2022-09-28 MED ORDER — OXALIPLATIN IVPB
85 mg/m2 | Freq: Once | INTRAVENOUS | 0 refills | Status: CP
Start: 2022-09-28 — End: ?
  Administered 2022-09-28 (×2): 187.85 mg via INTRAVENOUS

## 2022-09-28 NOTE — Progress Notes
Patient arrived to CC treatment for C3D1 oxaliplatin and adrucil. Patient seen in clinic by Lianne Moris, APRN, see provider notes for assessment details. All concerns and questions addressed with provider. Port flushed with NS, positive for blood return. Labs ok to treat. Pre medications administered per orders. Oxaliplatin administered per orders, patient tolerated without issue. Port flushed with NS post infusion, blood return present. Port remaining accessed for patient to return home on ambulatory pump. Patient left ambulatory in stable condition with 5FU infusing.       Cancer Therapeutics Note  Verified consent signed and in chart.    Blood return positive via: Port (Single, Power Port, and Accessed)    BSA and dose double checked (agree with orders as written) with: yes, see MAR    Labs/applicable tests checked: CBC and Comprehensive Metabolic Panel (CMP)    Treatment regimen: Drug/cycle/day D1C3 Oxaliplatin and adrucil    oxaliplatin (ELOXATIN) 187.85 mg in dextrose 5% (D5W) 287.57 mL IVPB     fluorouraciL (ADRUCIL) 4,420 mg in sodium chloride 0.9% (NS) 92 mL IV amb pump     Rate verified and armband double check with second RN: yes, see MAR    Patient education offered and stated understanding. Denies questions at this time.

## 2022-09-29 ENCOUNTER — Encounter: Admit: 2022-09-29 | Discharge: 2022-09-29 | Payer: 59

## 2022-09-29 MED ORDER — CICLOPIROX 8 % TP SOLN
1 refills | Status: AC
Start: 2022-09-29 — End: ?

## 2022-09-29 NOTE — Telephone Encounter
LOV 06/2022 with RTC 10/2022. At LOV, patient was to cont ciclopirox. Refilled per transcription until follow up.

## 2022-09-30 ENCOUNTER — Encounter: Admit: 2022-09-30 | Discharge: 2022-09-30 | Payer: 59

## 2022-09-30 DIAGNOSIS — C186 Malignant neoplasm of descending colon: Secondary | ICD-10-CM

## 2022-09-30 NOTE — Patient Instructions
Call Immediately to report the following:  Uncontrolled nausea and/or vomiting, uncontrolled pain, or unusual bleeding.  Temperature of 100.4 F or greater and/or any sign/symptom of infection (redness, warmth, tenderness)  Painful mouth or difficulty swallowing  Red, cracked, or painful hands and/or feet  Diarrhea   Swelling of arms or legs  Rash     Important Phone Numbers:  [facility] Main Number (answered 24 hours a day) [phone]  Cancer Center Scheduling (appointments) [phone]  [facility] (for nutritional supplements) [phone] For up to date information on the COVID-19 virus, visit the CDC website. https://www.cdc.gov/coronavirus   General supportive care during cold and flu season and infection prevention reminders:    o Wash hands often with soap and water for at least 20 seconds   o Cover your mouth and nose   o Social distancing: try to maintain 6 feet between you and other people   o Stay home if sick and symptoms mild or manageable?  If you must be around people wear a mask     If you are having symptoms of a lower respiratory infection (cough, shortness of breath) and/or fever AND either traveled in last 30 days (internationally or to region of exposure) OR known exposure to patient with COVID19:     o Call your primary care provider for questions or health needs.   Tell your doctor about your recent travel and your symptoms     o In a medical emergency, call 911 or go to the nearest emergency room.

## 2022-09-30 NOTE — Progress Notes
Patient arrived with 0.0 mL left in reservoir. Flushed port in right chest with 20 ccs saline. Tolerated procedure without complaint. Port access discontinued and patient was discharged in stable condition. She ambulated independently from the clinic.

## 2022-10-08 NOTE — Progress Notes
Name: Jennifer Durham          MRN: [id number]      DOB: [birth year]      AGE: 47 y.o.   DATE OF SERVICE: 10/12/2022    Subjective:             Reason for Visit:  Follow Up      Jennifer Durham is a 47 y.o. female.      Cancer Staging   [facility])  Staging form: Colon And Rectum, AJCC 8th Edition  - Clinical stage from 07/23/2022: Stage IIIB (cT3, cN2a, cM0) - Signed by Miguel Rota, MD on 08/11/2022    Malignant neoplasm of upper-outer quadrant of right breast in female, estrogen receptor positive (HCC)  Staging form: Breast, AJCC 8th Edition  - Clinical stage from 07/04/2019: Stage IA (cT1c, cN0(f), cM0, G2, ER+, PR+, HER2-) - Signed by Guy Begin, PA-C on 07/12/2019  - Pathologic stage from 08/21/2019: Stage IA (pT1c, pN1a, cM0, G2, ER+, PR+, HER2-) - Signed by Guy Begin, PA-C on 08/30/2019      History of Present Illness    47 y.o. female with hx of stage IA ER/PR+, HER2- IDC of the right breast diagnosed in 06/2019 after detecting dimpling of her breast in 05/2019. She presented for mammogram which identified a spiculated mass in the upper right medial breast. She underwent ultrasound and biopsy that confirmed carcinoma . Pathology review at [facility] confirmed grade 2 IDC with a focal lobular growth pattern and lymphoid tissue with no evidence of carcinoma in the right axillary specimen. She was recommended to undergo upfront surgery. On 08/01/2019, she underwent bilateral skin sparing mastectomy and right sentinel lymph node biopsy, axilla lymph node dissection, and reconstruction.  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  A 5 mm focus of metastatic carcinoma was found in 1 of 4 sentinel lymph nodes.  0 of 27 additional axillary nodes were involved. pT1c N1a M0 (1 of 34 LN+) M0. No LVI, No EIC, No ENE. She was recommended to receive adjuvant chemotherapy with TC x4 cycles,  underwent adjuvant radiotherapy to her right reconstructed breast, chest wall and regional nodes to a dose of 4526 cGy in 16 fractions from 02/19/2020 - 03/11/2020.      She is on tamoxifen and zoladex.     history of hypertension, polycystic ovarian disease, and prediabetesa colonoscopy     She had a colonoscopy performed on May 29th, 2024. The colonoscopy revealed a moderately differentiated adenocarcinoma suspected to be at the splenic flexure, 60 cm from the anal verge, and a large rectosigmoid polyp described as pedunculated. The polyp was biopsied but not removed and was identified as a tubulovillous adenoma. CT of the chest, abdomen, and pelvis, which showed no evidence of metastatic disease. The patient's CEA level was 0.8, and she was found to be microsatellite stable.     07/23/2022  Extended left hemi colectomy: pT3N2 (LN 5/48) mod diff adenocarcinoma, Lymphatic and / or Vascular Invasion: Present; Perineural Invasion--Present        Here for consultation about adjuvant chemotherapy. Overall recovering from surgery reasonable well, only complaint is mild intermittent lightheadedness, no F/C/N/V/D, no pain, no SOB, no DOE.     Accompanied by her husband.        Interval History     The patient presents for a follow-up after her last chemotherapy session. She reports feeling tired  but otherwise well. She experienced cold sensitivity for a longer duration than usual after her last chemotherapy session and had some nausea, but it was manageable. She denies having diarrhea or constipation. She has not been vomiting but reports a decreased appetite, which may be contributing to her weight loss of 10 pounds since her last visit. She also reports occasional lightheadedness, especially upon standing, which has been ongoing since her surgery. She has been managing occasional diarrhea with Imodium. She has been seeing a cardiologist, Dr. Sandria Manly, who has her on carvedilol and losartan for her hypertension. The patient also reports some tenderness in her belly, which she attributes to bloating.         Review of Systems   Constitutional:  Positive for fatigue and unexpected weight change. Negative for activity change, appetite change, chills, diaphoresis and fever.   HENT:  Negative for facial swelling, mouth sores, sore throat and trouble swallowing.         Sore in nostril   Eyes: Negative.    Respiratory: Negative.  Negative for cough, chest tightness, shortness of breath and wheezing.    Cardiovascular: Negative.  Negative for chest pain, palpitations and leg swelling.   Gastrointestinal:  Positive for constipation, diarrhea and nausea. Negative for abdominal distention, abdominal pain, anal bleeding, blood in stool, rectal pain and vomiting.   Endocrine: Positive for cold intolerance.   Genitourinary: Negative.    Musculoskeletal: Negative.  Negative for arthralgias, back pain, neck pain and neck stiffness.   Skin: Negative.  Negative for color change, pallor, rash and wound.   Neurological: Negative.  Negative for dizziness, weakness, light-headedness, numbness and headaches.   Psychiatric/Behavioral: Negative.             Objective:          carvediloL (COREG) 6.25 mg tablet Take one tablet by mouth twice daily with meals. Take with food.    CHOLEcalciferoL (vitamin D3) (OPTIMAL D3) 50,000 units capsule Take one capsule by mouth every 7 days. 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    escitalopram oxalate (LEXAPRO) 10 mg tablet TAKE ONE TABLET BY MOUTH EVERY DAY    ferrous sulfate (IRON) 325 mg (65 mg iron) tablet Take one tablet by mouth daily. Take on an empty stomach at least 1 hour before or 2 hours after food.    INTRAUTERINE DEVICE (IUD) IU by Intrauterine route.    levothyroxine (SYNTHROID) 50 mcg tablet Take one tablet by mouth daily.    lidocaine/prilocaine (EMLA) 2.5/2.5 % topical cream Apply pea sized amount of cream in a thick layer over port site 45 minutes prior to access, cover with transparent dressing    loperamide (IMODIUM A-D) 2 mg tablet 1 tablet 30-45 minutes prior to breakfast, lunch, and dinner    LORazepam (ATIVAN) 0.5 mg tablet Take 1 tab by mouth every 6 hrs as needed N/V not controlled by Zofran or Compazine. May also use every 6 hrs as needed anxiety or at bedtime insomnia.    losartan (COZAAR) 50 mg tablet Take one tablet by mouth daily.    metFORMIN (GLUCOPHAGE) 1,000 mg tablet Take one tablet by mouth daily with breakfast.    montelukast (SINGULAIR) 10 mg tablet Take one tablet by mouth at bedtime daily.  ondansetron HCL (ZOFRAN) 8 mg tablet Take one tablet by mouth every 8 hours as needed for Nausea or Vomiting. Avoid on Days 1-2 of each chemotherapy cycle.    prochlorperazine maleate (COMPAZINE) 10 mg tablet Take one tablet by mouth every 6 hours as needed for Nausea or Vomiting.    rosuvastatin (CRESTOR) 5 mg tablet Take one tablet by mouth daily.    tamoxifen (NOLVADEX) 20 mg tablet TAKE ONE TABLET BY MOUTH EVERY NIGHT AT BEDTIME    tirzepatide (MOUNJARO) 7.5 mg/0.5 mL injector PEN Inject 0.5 mL under the skin every 7 days. wednesday    traMADoL (ULTRAM) 50 mg tablet Take one tablet to two tablets by mouth every 6 hours as needed. Indications: pain     Vitals:    10/12/22 0927   BP: 99/67   BP Source: Arm, Left Upper   Pulse: 88   Temp: 36.2 ?C (97.2 ?F)   Resp: 16   SpO2: 97%   TempSrc: Temporal   PainSc: Zero   Weight: 126.4 kg (278 lb 9.6 oz)   Height: 181.8 cm (5' 11.58)         Body mass index is 38.24 kg/m?Marland Kitchen     Pain Score: Zero       Fatigue Scale: 6    Pain Addressed:  N/A    Patient Evaluated for a Clinical Trial: Patient currently in screening for a treatment clinical trial.     Eastern Cooperative Oncology Group performance status is 0, Fully active, able to carry on all pre-disease performance without restriction.Marland Kitchen     Physical Exam  Vitals reviewed.   Constitutional:       General: She is not in acute distress.     Appearance: Normal appearance. She is well-developed. She is not ill-appearing, toxic-appearing or diaphoretic.   HENT:      Head: Normocephalic and atraumatic.      Nose: Nose normal. No rhinorrhea.      Mouth/Throat:      Mouth: Mucous membranes are moist. Mucous membranes are not pale. No oral lesions.      Pharynx: Oropharynx is clear. No oropharyngeal exudate or posterior oropharyngeal erythema.      Tonsils: No tonsillar abscesses.   Eyes:      General: No scleral icterus.        Right eye: No discharge.         Left eye: No discharge.      Extraocular Movements: Extraocular movements intact.      Conjunctiva/sclera: Conjunctivae normal.      Pupils: Pupils are equal, round, and reactive to light.   Cardiovascular:      Rate and Rhythm: Normal rate and regular rhythm.      Pulses: Normal pulses.      Heart sounds: Normal heart sounds. No murmur heard.     No gallop.   Pulmonary:      Effort: Pulmonary effort is normal. No respiratory distress.      Breath sounds: Normal breath sounds. No stridor. No wheezing, rhonchi or rales.   Chest:      Chest wall: No tenderness.   Abdominal:      General: Abdomen is flat. A surgical scar is present. Bowel sounds are normal. There is no distension.      Palpations: Abdomen is soft. There is no mass.      Tenderness: There is abdominal tenderness in the periumbilical area. There is no guarding or rebound.      Hernia: No  hernia is present.      Comments: With palpation   Musculoskeletal:         General: No swelling, tenderness, deformity or signs of injury. Normal range of motion.      Cervical back: Normal range of motion and neck supple. No rigidity. No muscular tenderness.      Right lower leg: No edema.      Left lower leg: No edema.   Lymphadenopathy:      Cervical: No cervical adenopathy.   Skin:     General: Skin is warm and dry.      Coloration: Skin is not jaundiced or pale.      Findings: No bruising, erythema, lesion or rash.   Neurological:      General: No focal deficit present.      Mental Status: She is alert and oriented to person, place, and time. Mental status is at baseline.      Cranial Nerves: No cranial nerve deficit.      Motor: No weakness.      Coordination: Coordination normal.      Gait: Gait normal.   Psychiatric:         Mood and Affect: Mood normal.         Behavior: Behavior normal.         Thought Content: Thought content normal.         Judgment: Judgment normal.          07/23/2022  Pathology    Final Diagnosis:     A. 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      B. Colon, extended left hemi colectomy:    Adenocarcinoma moderately differentiated. See comment.   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 Additional proximal margin#1, excision:   Colon tissue with Tubular adenomas. See comment.   Three lymph nodes negative for metastatic carcinoma (0/3).     D. Additional proximal margins #2, excision:    Tubulovillous adenoma with focal high-grade dysplasia   Eleven lymph nodes negative for metastatic carcinoma (0/11).       Comment:   Pursuant to the Quality Assurance Program at the [facility] Pathology Department, selected slides from this case have been   concurrently reviewed by the following pathologist: Dr. Juanetta Beets   who   agrees with the final diagnosis.       CASE SUMMARY: (COLON AND RECTUM: Resection)   Standard(s): AJCC-UICC 8   CAP Version: Colon and Rectum Resection 4.3.0.0     SPECIMEN     Procedure   ___ Subtotal (extended) left abdominal colectomy     Macroscopic Evaluation of Mesorectum (required for rectal cancers)   ___ Not applicable     TUMOR     Tumor Site (select all that apply)   ___ Transverse colon/Splenic flexure     Histologic Type   ___ Adenocarcinoma     Histologic Grade   ___ G2, moderately differentiated     Tumor Size   ___ Greatest dimension in Centimeters (cm): 4.0 x 3.8 x 0.7 cm     Multiple Primary Sites (e.g., hepatic flexure and transverse colon)   ___ Not applicable (no additional primary site(s) present)     Tumor Extent   ___ Invades through muscularis propria into the pericolonic  or perirectal   tissue     Sub-mucosal Invasion (required only for pT1 tumors)   ___ Not applicable (not a pT1 tumor)     Macroscopic Tumor Perforation   ___ Not identified     Lymphatic and / or Vascular Invasion(select all that apply)   ___ Present (not otherwise specified)     Perineural Invasion   ___ Present     Tumor Budding Score   ___ Intermediate (5-9)     Treatment Effect   ___ No known presurgical therapy     MARGINS     Margin Status for Invasive Carcinoma   ___ All margins negative for invasive carcinoma   +Closest Margin(s) to Invasive Carcinoma (select all that apply)   ___ Radial (circumferential)/Mesenteric   +Distance from Invasive Carcinoma to Closest Margin   Specify in Centimeters (cm)   ___ Exact distance in cm: 3.4 cm   Distance from Invasive Carcinoma to Radial (Circumferential) Margin   (required for rectal   tumors)   ___ Distance already reported as closest margin.   +Distance from Invasive Carcinoma to Distal Margin (recommended for rectal   tumors)   Specify in Centimeters (cm)   ___ Exact distance in cm: 58.0 cm   Margin(s) Involved by Invasive Carcinoma (select all that apply)   ___ Not applicable     Margin Status for Non-Invasive Tumor (select all that apply)   ___ All margins negative for high-grade dysplasia / intramucosal carcinoma   and low-grade dysplasia     REGIONAL LYMPH NODES     Regional Lymph Node Status   ___ Regional lymph nodes present   ___ Tumor present in regional lymph nodes   Number of Lymph Nodes with Tumor   ___ Exact number (specify): 5   Number of Lymph Nodes Examined   ___ Exact number (specify): 34     Tumor Deposits   ___ Not identified     DISTANT METASTASIS     Distant Site(s) Involved, if applicable (select all that apply)   ___ Not applicable     pTNM CLASSIFICATION (AJCC 8th Edition)   Reporting of pT, pN, and (when applicable) pM categories is based on   information available to the pathologist at the time the report is issued.   As per the AJCC (Chapter 1, 8th Ed.) it is the managing physician's   responsibility to establish the final pathologic stage based upon all   pertinent information, including but potentially not limited to this   pathology report.     Modified Classification (required only if applicable) (select all that   apply)   ___ Not applicable     pT Category   ___ pT3: Tumor invades through the muscularis propria into pericolorectal   tissues     T Suffix (required only if applicable)   ___ Not applicable     pN Category   pN2: Four or more regional nodes are positive   ___ pN2a: Four to six regional lymph nodes are positive     pM Category (required only if confirmed pathologically)   ___ Not applicable - pM cannot be determined from the submitted   specimen(s)     ADDITIONAL FINDINGS     +Additional Findings (select all that apply)   ___ Adenoma(s) (Tubulovillous adenoma with high grade dysplasia, 6.3 cm.   1.9 cm., rectosigmoid; tubular adenomas)      Latest Reference Range & Units 10/12/22 08:29   Hemoglobin 12.0 - 15.0 GM/DL 19.1 (L)  Hematocrit 36 - 45 % 30.3 (L)   Platelet Count 150 - 400 K/UL 195   White Blood Cells 4.5 - 11.0 K/UL 5.5   Neutrophils 41 - 77 % 68   Absolute Neutrophil Count 1.8 - 7.0 K/UL 3.80   Lymphocytes 24 - 44 % 19 (L)   Absolute Lymph Count 1.0 - 4.8 K/UL 1.00   Monocytes 4 - 12 % 8   Absolute Monocyte Count 0 - 0.80 K/UL 0.40   Eosinophils 0 - 5 % 4   Absolute Eosinophil Count 0 - 0.45 K/UL 0.20   Absolute Basophil Count 0 - 0.20 K/UL 0.10   Basophils 0 - 2 % 1   RBC 4.0 - 5.0 M/UL 3.74 (L)   MCV 80 - 100 FL 81.0   MCH 26 - 34 PG 27.0   MCHC 32.0 - 36.0 G/DL 19.1   MPV 7 - 11 FL 6.7 (L)   RDW 11 - 15 % 17.1 (H)   Sodium 137 - 147 MMOL/L 140   Potassium 3.5 - 5.1 MMOL/L 3.9   Chloride 98 - 110 MMOL/L 103   CO2 21 - 30 MMOL/L 29   Anion Gap 3 - 12  8   Blood Urea Nitrogen 7 - 25 MG/DL 12   Creatinine 0.4 - 4.78 MG/DL 2.95 (H)   eGFR >62 mL/min 55 (L)   Glucose 70 - 100 MG/DL 130 (H)   Albumin 3.5 - 5.0 G/DL 3.6   Calcium 8.5 - 86.5 MG/DL 9.1   Total Bilirubin 0.2 - 1.3 MG/DL 0.4   Total Protein 6.0 - 8.0 G/DL 6.8   AST (SGOT) 7 - 40 U/L 26   ALT (SGPT) 7 - 56 U/L 14   Alk Phosphatase 25 - 110 U/L 40   (L): Data is abnormally low  (H): Data is abnormally high     Assessment and Plan:  47 yr old female with history of right breast cancer (07/2019): ER/PR+, HER2- IDC pT1c N1a M0 (1 of 34 LN+) M0.  on  tamoxifen and zoladex. Who has newly diagnosed left colon adenocarcinoma s/P: pT3N2 (LN 5/48) mod diff , positive for  Lymphatic and / or Vascular Invasion, and Perineural Invasion.   Here for consultation about adjuvant chemotherapy. Overall recovering from surgery reasonable well, only complaint is mild intermittent lightheadedness, no F/C/N/V/D, no pain, no SOB, no DOE.     Reviewed images and pathology with the pt and husband about her stage of disease, the risk of MRD/recurrence of disease, the rationale/mechanism of adjuvant treatment, risk vs benefit, regimen of FOLFOX (a schedule, timing, length), and potential side effects. (Info given and consent done)   Also discussed the surveillance   We discussed the study of ctDNA (C-14), the pt is interested      Patient is currently on [id number] using adjusted body weight.      Plan:          Tolerating FOLFOX well with manageable nausea and occasional diarrhea with same doses/adjusted body weight  Cold sensitivity lasting longer than previous cycles. Noted weight loss due to decreased appetite, but no vomiting.  -Continue C4 today, continue zofran, compazine, decadron for nausea and 1/2 tab imodium for diarrhea.  -Encourage increased fluid intake to address dehydration and monitor weight closely.  IVF with treatment D1 and on D3, pump unhook.   Blood pressure low, possibly due to weight loss and dehydration. Patient reports feeling dizzy and lightheaded upon standing. Currently on carvedilol and losartan for  hypertension; communicate with cardiologist (Dr. Sandria Manly) for further management.  RTC in 2 weeks with labs and visit for C5  Continue supportive care. Pt to call with any questions, concerns or symptoms at any time and we can see her sooner.           Turner Daniels, APRN-NP        The patient and family were allowed to ask questions and voice concerns; these were addressed to the best of our ability. They expressed understanding of what was explained to them, and they agreed with the present plan. RTC in 2 weeks with labs to see a provider. Patient has the phone numbers for the Cancer Center and was instructed on how to contact us with any questions or concerns. My collaborating physician on this case is Dr. Wynelle Link

## 2022-10-09 ENCOUNTER — Encounter: Admit: 2022-10-09 | Discharge: 2022-10-09 | Payer: 59

## 2022-10-12 ENCOUNTER — Encounter: Admit: 2022-10-12 | Discharge: 2022-10-12 | Payer: 59

## 2022-10-12 DIAGNOSIS — T7840XA Allergy, unspecified, initial encounter: Secondary | ICD-10-CM

## 2022-10-12 DIAGNOSIS — E282 Polycystic ovarian syndrome: Secondary | ICD-10-CM

## 2022-10-12 DIAGNOSIS — C50919 Malignant neoplasm of unspecified site of unspecified female breast: Secondary | ICD-10-CM

## 2022-10-12 DIAGNOSIS — C186 Malignant neoplasm of descending colon: Secondary | ICD-10-CM

## 2022-10-12 DIAGNOSIS — I10 Essential (primary) hypertension: Secondary | ICD-10-CM

## 2022-10-12 DIAGNOSIS — Z789 Other specified health status: Secondary | ICD-10-CM

## 2022-10-12 DIAGNOSIS — E669 Obesity, unspecified: Secondary | ICD-10-CM

## 2022-10-12 DIAGNOSIS — F3289 Other specified depressive episodes: Secondary | ICD-10-CM

## 2022-10-12 DIAGNOSIS — Z923 Personal history of irradiation: Secondary | ICD-10-CM

## 2022-10-12 DIAGNOSIS — R12 Heartburn: Secondary | ICD-10-CM

## 2022-10-12 DIAGNOSIS — R000 Tachycardia, unspecified: Secondary | ICD-10-CM

## 2022-10-12 DIAGNOSIS — E88819 Insulin resistance: Secondary | ICD-10-CM

## 2022-10-12 DIAGNOSIS — E039 Hypothyroidism, unspecified: Secondary | ICD-10-CM

## 2022-10-12 DIAGNOSIS — F32A Depression: Secondary | ICD-10-CM

## 2022-10-12 DIAGNOSIS — J302 Other seasonal allergic rhinitis: Secondary | ICD-10-CM

## 2022-10-12 DIAGNOSIS — C189 Malignant neoplasm of colon, unspecified: Secondary | ICD-10-CM

## 2022-10-12 DIAGNOSIS — F419 Anxiety disorder, unspecified: Secondary | ICD-10-CM

## 2022-10-12 DIAGNOSIS — E785 Hyperlipidemia, unspecified: Secondary | ICD-10-CM

## 2022-10-12 LAB — COMPREHENSIVE METABOLIC PANEL
POTASSIUM: 3.9 MMOL/L (ref 3.5–5.1)
SODIUM: 140 MMOL/L (ref 137–147)

## 2022-10-12 LAB — CBC AND DIFF
ABSOLUTE BASO COUNT: 0.1 10*3/uL (ref 0–0.20)
ABSOLUTE EOS COUNT: 0.2 10*3/uL (ref 0–0.45)
ABSOLUTE LYMPH COUNT: 1 10*3/uL (ref 1.0–4.8)
ABSOLUTE MONO COUNT: 0.4 10*3/uL (ref 0–0.80)
ABSOLUTE NEUTROPHIL: 3.8 10*3/uL (ref 1.8–7.0)
BASOPHILS %: 1 % (ref 0–2)
EOSINOPHILS %: 4 % — ABNORMAL LOW (ref >60)
HEMATOCRIT: 30 % — ABNORMAL LOW (ref 36–45)
HEMOGLOBIN: 10 g/dL — ABNORMAL LOW (ref 12.0–15.0)
LYMPHOCYTES %: 19 % — ABNORMAL LOW (ref 24–44)
MCH: 27 pg (ref 26–34)
MCHC: 33 g/dL (ref 32.0–36.0)
MCV: 81 FL (ref 80–100)
MONOCYTES %: 8 % (ref 4–12)
MPV: 6.7 FL — ABNORMAL LOW (ref 7–11)
NEUTROPHILS %: 68 % (ref 41–77)
PLATELET COUNT: 195 10*3/uL (ref 150–400)
RBC COUNT: 3.7 M/UL — ABNORMAL LOW (ref 4.0–5.0)
RDW: 17 % — ABNORMAL HIGH (ref 11–15)
WBC COUNT: 5.5 10*3/uL (ref 4.5–11.0)

## 2022-10-12 LAB — CEA(CARCINOEMBRYONIC AG): CEA: 0.5 ng/mL (ref <3.0)

## 2022-10-12 MED ORDER — SODIUM CHLORIDE 0.9 % IV SOLP
1000 mL | Freq: Once | INTRAVENOUS | 0 refills
Start: 2022-10-12 — End: ?

## 2022-10-12 MED ORDER — DEXAMETHASONE 4 MG PO TAB
4 mg | Freq: Once | ORAL | 0 refills
Start: 2022-10-12 — End: ?

## 2022-10-12 MED ORDER — PALONOSETRON 0.25 MG/5 ML IV SOLN
.25 mg | Freq: Once | INTRAVENOUS | 0 refills
Start: 2022-10-12 — End: ?

## 2022-10-12 MED ORDER — SODIUM CHLORIDE 0.9 % IV SOLP
1000 mL | Freq: Once | INTRAVENOUS | 0 refills | Status: CP
Start: 2022-10-12 — End: ?
  Administered 2022-10-12: 16:00:00 1000 mL via INTRAVENOUS

## 2022-10-12 MED ORDER — FLUOROURACIL IV AMB PUMP
2000 mg/m2 | Freq: Once | INTRAVENOUS | 0 refills
Start: 2022-10-12 — End: ?

## 2022-10-12 MED ORDER — PALONOSETRON 0.25 MG/5 ML IV SOLN
.25 mg | Freq: Once | INTRAVENOUS | 0 refills | Status: CP
Start: 2022-10-12 — End: ?
  Administered 2022-10-12: 16:00:00 0.25 mg via INTRAVENOUS

## 2022-10-12 MED ORDER — FLUOROURACIL IV AMB PUMP
2000 mg/m2 | Freq: Once | INTRAVENOUS | 0 refills | Status: CP
Start: 2022-10-12 — End: ?
  Administered 2022-10-12 (×3): 4420 mg via INTRAVENOUS

## 2022-10-12 MED ORDER — OXALIPLATIN IVPB
85 mg/m2 | Freq: Once | INTRAVENOUS | 0 refills | Status: CP
Start: 2022-10-12 — End: ?
  Administered 2022-10-12 (×2): 187.85 mg via INTRAVENOUS

## 2022-10-12 MED ORDER — DEXAMETHASONE 4 MG PO TAB
4 mg | Freq: Once | ORAL | 0 refills | Status: CP
Start: 2022-10-12 — End: ?
  Administered 2022-10-12: 16:00:00 4 mg via ORAL

## 2022-10-12 NOTE — Progress Notes
CHEMO NOTE  Verified chemo consent signed and in chart.    Blood return positive via: Port (Single and Accessed)    BSA and dose double checked (agree with orders as written) with: yes     Labs/applicable tests checked: CBC and Comprehensive Metabolic Panel (CMP)    Chemo regimen: Drug/cycle/day FOLFOX IV C4 D1    Rate verified and armband double check with second RN: yes    Patient education offered and stated understanding. Denies questions at this time.    Chemo consent signed on 08/25/22.  Pt seen in exam by Lianne Moris, ARNP, no new questions or concerns at this time.  Pt tolerated oxaliplatin infusion without incident.  Port flushed with good blood return and connected to 5FU ambulatory pump.  Pt denies need for AVS, left ambulatory at 1424.

## 2022-10-12 NOTE — Patient Instructions
Call Immediately to report the following:  Unexplained bleeding or bleeding that will not stop  Difficulty swallowing  Shortness of breath, wheezing, or trouble breathing  Rapid, irregular heartbeat; chest pain  Dizziness, lightheadedness  Rash or cut that swells or turns red, feels hot or painful, or begin to ooze  Diarrhea   Uncontrolled nausea or vomiting  Fever of 100.4 F or higher, or chills    Important Phone Numbers:  Cancer Center Main Number (answered 24 hours a day) [phone]  Cancer Center Scheduling (appointments) [phone]  Social Worker [phone]  Nutritionist [phone]

## 2022-10-13 ENCOUNTER — Encounter: Admit: 2022-10-13 | Discharge: 2022-10-13 | Payer: 59

## 2022-10-13 MED ORDER — LOPERAMIDE 2 MG PO CAP
ORAL_CAPSULE | 1 refills
Start: 2022-10-13 — End: ?

## 2022-10-14 ENCOUNTER — Encounter: Admit: 2022-10-14 | Discharge: 2022-10-14 | Payer: 59

## 2022-10-14 DIAGNOSIS — C186 Malignant neoplasm of descending colon: Secondary | ICD-10-CM

## 2022-10-14 MED ORDER — SODIUM CHLORIDE 0.9 % IV SOLP
1000 mL | Freq: Once | INTRAVENOUS | 0 refills | Status: CP
Start: 2022-10-14 — End: ?
  Administered 2022-10-14: 20:00:00 1000 mL via INTRAVENOUS
  Administered 2022-10-14: 20:00:00

## 2022-10-14 NOTE — Progress Notes
Patient arrived to CC treatment for pump removal. Assessment completed on patient. See doc flow sheet for full assessment. 0mL left in pump. Pump removed. IVF given without issue, patient tolerated well. Port positive for blood return, flushed with saline, and then de-accessed. Patient left CC treatment in stable condition.

## 2022-10-14 NOTE — Patient Instructions
[facility]  Chemotherapy Instructions    Arielis Leonhart 10/14/2022    Treatment Received Today:  pump DC, IVF        Scheduled Medications:    As Needed Medications:          Call Immediately to report the following:  Unexplained bleeding or bleeding that will not stop  Difficulty swallowing  Shortness of breath, wheezing, or trouble breathing  Rapid, irregular heartbeat; chest pain  Dizziness, lightheadedness  Rash or cut that swells or turns red, feels hot or painful, or begin to ooze  Diarrhea   Uncontrolled nausea or vomiting  Fever of 100.4 F or higher, or chills    Important Phone Numbers:  Cancer Center Main Number (answered 24 hours a day) [phone]  Cancer Center Scheduling (appointments) [phone]  Social Worker [phone]

## 2022-10-20 ENCOUNTER — Encounter: Admit: 2022-10-20 | Discharge: 2022-10-20 | Payer: 59

## 2022-10-20 MED ORDER — LOSARTAN 25 MG PO TAB
12.5 mg | ORAL_TABLET | Freq: Every day | ORAL | 1 refills | 90.00000 days | Status: AC
Start: 2022-10-20 — End: ?

## 2022-10-20 NOTE — Telephone Encounter
-----   Message from [place] sent at 10/20/2022  8:07 AM CDT -----  Looks like this is a sun pt  ----- Message -----  From: Turner Daniels, APRN-NP  Sent: 10/19/2022   5:37 PM CDT  To: Vanita Ingles, MD; Gaetano Hawthorne, RN; #    Could we let Meliza know Dr. Imagene Gurney recommendation please? And thank you  ----- Message -----  From: Altamease Oiler, MD  Sent: 10/19/2022   1:24 PM CDT  To: Turner Daniels, APRN-NP    Hi Erin,    Lets decrease the losartan down to 12.5 mg daily.  May need to stop it altogether if symptoms and hypotension persist.  Definitely keep me in the loop if not too much trouble.      Thanks for reaching out!    Feliz Beam  ----- Message -----  From: Turner Daniels, APRN-NP  Sent: 10/12/2022   9:50 AM CDT  To: Rosezetta Schlatter, RN; Altamease Oiler, MD    Hi Dr. Sandria Manly,  I work with Dr. Wynelle Link, [facility] oncologist, and she is a third of the way through her adjuvant chemotherapy. She has been experiencing some dizziness and hypotension (90-100/60-70s) of late, likely due to weight loss. She is still on losartan 50 mg and coreg 6.25 mg. We were wondering if these needed some adjustment and didn't want to do anything without your input. We appreciate your help with this very nice lady.  Thanks,  Lianne Moris NP

## 2022-10-20 NOTE — Telephone Encounter
I sent new RX to pt's preferred pharmacy for 12.5 mg Losartan daily.  I called patient, notified her of the information from staff message, notified her new RX was sent to her pharmacy, notified her she should take half a tablet daily of 25 mg tab to equal 12.5 mg Losartan daily, she verbalized understanding.  I asked her to keep a log of her BP so we can track how she is responding to this new dose of medication, she verbalized understanding.

## 2022-10-26 ENCOUNTER — Encounter: Admit: 2022-10-26 | Discharge: 2022-10-26 | Payer: 59

## 2022-10-26 DIAGNOSIS — I10 Essential (primary) hypertension: Secondary | ICD-10-CM

## 2022-10-26 DIAGNOSIS — C186 Malignant neoplasm of descending colon: Secondary | ICD-10-CM

## 2022-10-26 DIAGNOSIS — R12 Heartburn: Secondary | ICD-10-CM

## 2022-10-26 DIAGNOSIS — E039 Hypothyroidism, unspecified: Secondary | ICD-10-CM

## 2022-10-26 DIAGNOSIS — E282 Polycystic ovarian syndrome: Secondary | ICD-10-CM

## 2022-10-26 DIAGNOSIS — F3289 Other specified depressive episodes: Secondary | ICD-10-CM

## 2022-10-26 DIAGNOSIS — C189 Malignant neoplasm of colon, unspecified: Secondary | ICD-10-CM

## 2022-10-26 DIAGNOSIS — F32A Depression: Secondary | ICD-10-CM

## 2022-10-26 DIAGNOSIS — T7840XA Allergy, unspecified, initial encounter: Secondary | ICD-10-CM

## 2022-10-26 DIAGNOSIS — E785 Hyperlipidemia, unspecified: Secondary | ICD-10-CM

## 2022-10-26 DIAGNOSIS — R000 Tachycardia, unspecified: Secondary | ICD-10-CM

## 2022-10-26 DIAGNOSIS — Z923 Personal history of irradiation: Secondary | ICD-10-CM

## 2022-10-26 DIAGNOSIS — E88819 Insulin resistance: Secondary | ICD-10-CM

## 2022-10-26 DIAGNOSIS — F419 Anxiety disorder, unspecified: Secondary | ICD-10-CM

## 2022-10-26 DIAGNOSIS — E669 Obesity, unspecified: Secondary | ICD-10-CM

## 2022-10-26 DIAGNOSIS — Z789 Other specified health status: Secondary | ICD-10-CM

## 2022-10-26 DIAGNOSIS — J302 Other seasonal allergic rhinitis: Secondary | ICD-10-CM

## 2022-10-26 DIAGNOSIS — C50919 Malignant neoplasm of unspecified site of unspecified female breast: Secondary | ICD-10-CM

## 2022-10-26 LAB — CBC AND DIFF
ABSOLUTE BASO COUNT: 0.1 10*3/uL (ref 0–0.20)
ABSOLUTE EOS COUNT: 0.2 10*3/uL (ref 0–0.45)
ABSOLUTE LYMPH COUNT: 0.9 10*3/uL — ABNORMAL LOW (ref 1.0–4.8)
ABSOLUTE MONO COUNT: 0.5 10*3/uL (ref 0–0.80)
ABSOLUTE NEUTROPHIL: 3.7 10*3/uL (ref 1.8–7.0)
BASOPHILS %: 1 % (ref 0–2)
EOSINOPHILS %: 4 % (ref 0–5)
HEMATOCRIT: 33 % — ABNORMAL LOW (ref 36–45)
HEMOGLOBIN: 11 g/dL — ABNORMAL LOW (ref 12.0–15.0)
LYMPHOCYTES %: 18 % — ABNORMAL LOW (ref 24–44)
MCH: 27 pg (ref 26–34)
MCHC: 33 g/dL (ref 32.0–36.0)
MCV: 81 FL (ref 80–100)
MONOCYTES %: 9 % (ref 4–12)
MPV: 6.7 FL — ABNORMAL LOW (ref 7–11)
NEUTROPHILS %: 68 % (ref >60)
PLATELET COUNT: 198 10*3/uL (ref 150–400)
RBC COUNT: 4 M/UL (ref 4.0–5.0)
RDW: 17 % — ABNORMAL HIGH (ref 11–15)
WBC COUNT: 5.3 10*3/uL — ABNORMAL HIGH (ref 4.5–11.0)

## 2022-10-26 LAB — CEA(CARCINOEMBRYONIC AG): CEA: 0.5 ng/mL (ref <3.0)

## 2022-10-26 LAB — COMPREHENSIVE METABOLIC PANEL
BLD UREA NITROGEN: 9 mg/dL (ref 7–25)
CHLORIDE: 101 MMOL/L (ref 98–110)
GLUCOSE,PANEL: 101 mg/dL — ABNORMAL HIGH (ref 70–100)
POTASSIUM: 4 MMOL/L (ref 3.5–5.1)
SODIUM: 138 MMOL/L (ref 137–147)

## 2022-10-26 MED ORDER — FLUOROURACIL IV AMB PUMP
2000 mg/m2 | Freq: Once | INTRAVENOUS | 0 refills | Status: CP
Start: 2022-10-26 — End: ?
  Administered 2022-10-26 (×2): 4420 mg via INTRAVENOUS

## 2022-10-26 MED ORDER — SODIUM CHLORIDE 0.9 % IV SOLP
1000 mL | Freq: Once | INTRAVENOUS | 0 refills | Status: CP
Start: 2022-10-26 — End: ?
  Administered 2022-10-26: 16:00:00 1000 mL via INTRAVENOUS

## 2022-10-26 MED ORDER — OXALIPLATIN IVPB
85 mg/m2 | Freq: Once | INTRAVENOUS | 0 refills | Status: CP
Start: 2022-10-26 — End: ?
  Administered 2022-10-26 (×2): 187.85 mg via INTRAVENOUS

## 2022-10-26 MED ORDER — DEXAMETHASONE 4 MG PO TAB
4 mg | Freq: Once | ORAL | 0 refills | Status: CP
Start: 2022-10-26 — End: ?
  Administered 2022-10-26: 16:00:00 4 mg via ORAL

## 2022-10-26 MED ORDER — FEROSUL 325 MG (65 MG IRON) PO TAB
ORAL_TABLET | 0 refills
Start: 2022-10-26 — End: ?

## 2022-10-26 MED ORDER — PALONOSETRON 0.25 MG/5 ML IV SOLN
.25 mg | Freq: Once | INTRAVENOUS | 0 refills | Status: CP
Start: 2022-10-26 — End: ?
  Administered 2022-10-26: 16:00:00 0.25 mg via INTRAVENOUS

## 2022-10-26 NOTE — Progress Notes
Cancer Therapeutics Note  Verified consent signed and in chart.    Blood return positive via: Port (Single, Power Port, and Accessed)    BSA and dose double checked (agree with orders as written) with: yes Judd Gaudier, RN    Labs/applicable tests checked: CBC and Comprehensive Metabolic Panel (CMP)    Treatment regimen: C5D1    oxaliplatin (ELOXATIN) 187.85 mg in dextrose 5% (D5W) 287.57 mL IVPB     fluorouraciL (ADRUCIL) 4,420 mg in sodium chloride 0.9% (NS) 92 mL IV amb pump     Rate verified and armband double check with second RN: yes    Patient education offered and stated understanding. Denies questions at this time.    Patient arrived to CC treatment after being seen in clinic with Dr. Wynelle Link; please refer to clinic note for assessment details. Premeds given per treatment plan. Oxaliplatin given w/o complications, patient tolerated well. Port positive for blood return, flushed with saline, and currently infusing 5FU via ambulatory pump. All connections secure, unclamped, and face of pump reads infusing. Cadd unhook scheduled for 1430 on 10/3 at the [place] location. Patient declined copy of labs and AVS. All questions and concerns addressed. Patient left CC treatment in stable condition.

## 2022-10-26 NOTE — Progress Notes
Name: Jennifer Durham          MRN: [id number]      DOB: 04/03/1975      AGE: 47 y.o.   DATE OF SERVICE: 10/26/2022    Subjective:             Reason for Visit:  Follow Up      Jennifer Durham is a 47 y.o. female.      Cancer Staging   Colon cancer [facility])  Staging form: Colon And Rectum, AJCC 8th Edition  - Clinical stage from 07/23/2022: Stage IIIB (cT3, cN2a, cM0) - Signed by Miguel Rota, MD on 08/11/2022    Malignant neoplasm of upper-outer quadrant of right breast in female, estrogen receptor positive  Staging form: Breast, AJCC 8th Edition  - Clinical stage from 07/04/2019: Stage IA (cT1c, cN0(f), cM0, G2, ER+, PR+, HER2-) - Signed by Guy Begin, PA-C on 07/12/2019  - Pathologic stage from 08/21/2019: Stage IA (pT1c, pN1a, cM0, G2, ER+, PR+, HER2-) - Signed by Guy Begin, PA-C on 08/30/2019      History of Present Illness    47 y.o. female with hx of stage IA ER/PR+, HER2- IDC of the right breast diagnosed in 06/2019 after detecting dimpling of her breast in 05/2019. She presented for mammogram which identified a spiculated mass in the upper right medial breast. She underwent ultrasound and biopsy that confirmed carcinoma . Pathology review at [facility] confirmed grade 2 IDC with a focal lobular growth pattern and lymphoid tissue with no evidence of carcinoma in the right axillary specimen. She was recommended to undergo upfront surgery. On 08/01/2019, she underwent bilateral skin sparing mastectomy and right sentinel lymph node biopsy, axilla lymph node dissection, and reconstruction.  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  A 5 mm focus of metastatic carcinoma was found in 1 of 4 sentinel lymph nodes.  0 of 27 additional axillary nodes were involved. pT1c N1a M0 (1 of 34 LN+) M0. No LVI, No EIC, No ENE. She was recommended to receive adjuvant chemotherapy with TC x4 cycles,  underwent adjuvant radiotherapy to her right reconstructed breast, chest wall and regional nodes to a dose of 4526 cGy in 16 fractions from 02/19/2020 - 03/11/2020.      She is on tamoxifen and zoladex.     history of hypertension, polycystic ovarian disease, and prediabetesa colonoscopy     She had a colonoscopy performed on May 29th, 2024. The colonoscopy revealed a moderately differentiated adenocarcinoma suspected to be at the splenic flexure, 60 cm from the anal verge, and a large rectosigmoid polyp described as pedunculated. The polyp was biopsied but not removed and was identified as a tubulovillous adenoma. CT of the chest, abdomen, and pelvis, which showed no evidence of metastatic disease. The patient's CEA level was 0.8, and she was found to be microsatellite stable.     07/23/2022  Extended left hemi colectomy: pT3N2 (LN 5/48) mod diff adenocarcinoma, Lymphatic and / or Vascular Invasion: Present; Perineural Invasion--Present        Here for consultation about adjuvant chemotherapy. Overall recovering from surgery reasonable well, only complaint is mild intermittent lightheadedness, no F/C/N/V/D, no pain, no SOB, no DOE.     Accompanied by her husband.        Interval History,  10/26/2022  Cont follow up with new labs, clinically doing well, tolerate the treatment well, occasional dizziness \, no  F/C/N/V/D,     Review of Systems 12 system reviewed negative except above stated    Mother had polyps  2 siblings: age 25, 90; 2 biologic children: 32, and 7 yrs old     Objective:          carvediloL (COREG) 6.25 mg tablet Take one tablet by mouth twice daily with meals. Take with food.    CHOLEcalciferoL (vitamin D3) (OPTIMAL D3) 50,000 units capsule Take one capsule by mouth every 7 days. 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    escitalopram oxalate (LEXAPRO) 10 mg tablet TAKE ONE TABLET BY MOUTH EVERY DAY    ferrous sulfate (IRON) 325 mg (65 mg iron) tablet Take one tablet by mouth daily. Take on an empty stomach at least 1 hour before or 2 hours after food.    INTRAUTERINE DEVICE (IUD) IU by Intrauterine route.    levothyroxine (SYNTHROID) 50 mcg tablet Take one tablet by mouth daily.    lidocaine/prilocaine (EMLA) 2.5/2.5 % topical cream Apply pea sized amount of cream in a thick layer over port site 45 minutes prior to access, cover with transparent dressing    loperamide (IMODIUM A-D) 2 mg capsule TAKE ONE CAPSULE BY MOUTH THREE TIMES DAILY, 30-45 MINUTES prior to meals    LORazepam (ATIVAN) 0.5 mg tablet Take 1 tab by mouth every 6 hrs as needed N/V not controlled by Zofran or Compazine. May also use every 6 hrs as needed anxiety or at bedtime insomnia.    losartan (COZAAR) 25 mg tablet Take one-half tablet by mouth daily.    metFORMIN (GLUCOPHAGE) 1,000 mg tablet Take one tablet by mouth daily with breakfast.    montelukast (SINGULAIR) 10 mg tablet Take one tablet by mouth at bedtime daily.    ondansetron HCL (ZOFRAN) 8 mg tablet Take one tablet by mouth every 8 hours as needed for Nausea or Vomiting. Avoid on Days 1-2 of each chemotherapy cycle.    prochlorperazine maleate (COMPAZINE) 10 mg tablet Take one tablet by mouth every 6 hours as needed for Nausea or Vomiting.    rosuvastatin (CRESTOR) 5 mg tablet Take one tablet by mouth daily.    tamoxifen (NOLVADEX) 20 mg tablet TAKE ONE TABLET BY MOUTH EVERY NIGHT AT BEDTIME    tirzepatide (MOUNJARO) 7.5 mg/0.5 mL injector PEN Inject 0.5 mL under the skin every 7 days. wednesday    traMADoL (ULTRAM) 50 mg tablet Take one tablet to two tablets by mouth every 6 hours as needed. Indications: pain     Vitals:    10/26/22 0906   BP: 93/73   BP Source: Arm, Left Upper   Pulse: 85   Temp: 36.3 ?C (97.4 ?F)   Resp: 16   SpO2: 98%   TempSrc: Temporal   PainSc: Zero Weight: 123.2 kg (271 lb 9.6 oz)     Body mass index is 37.27 kg/m?Marland Kitchen     Pain Score: Zero       Fatigue Scale: 4    Pain Addressed:  N/A    Patient Evaluated for a Clinical Trial: Patient currently in screening for a treatment clinical trial.     [place] Cooperative Oncology Group performance status is 0, Fully active, able to carry on all pre-disease performance without restriction.Marland Kitchen  Physical Exam  Constitutional:       Appearance: Normal appearance.   HENT:      Mouth/Throat:      Mouth: Mucous membranes are moist.   Eyes:      Extraocular Movements: Extraocular movements intact.      Conjunctiva/sclera: Conjunctivae normal.      Pupils: Pupils are equal, round, and reactive to light.   Cardiovascular:      Rate and Rhythm: Normal rate and regular rhythm.      Pulses: Normal pulses.      Heart sounds: Normal heart sounds.   Pulmonary:      Effort: Pulmonary effort is normal.      Breath sounds: Normal breath sounds.   Abdominal:      General: Abdomen is flat. There is no distension.      Palpations: Abdomen is soft.      Tenderness: There is no abdominal tenderness.   Musculoskeletal:      Cervical back: Normal range of motion and neck supple.   Skin:     General: Skin is warm.   Neurological:      General: No focal deficit present.      Mental Status: She is alert and oriented to person, place, and time.   Psychiatric:         Mood and Affect: Mood normal.         Behavior: Behavior normal.        07/23/2022  Pathology    Final Diagnosis:     A. 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      B. Colon, extended left hemi colectomy:    Adenocarcinoma moderately differentiated. See comment.   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 Additional proximal margin#1, excision:   Colon tissue with Tubular adenomas. See comment.   Three lymph nodes negative for metastatic carcinoma (0/3).     D. Additional proximal margins #2, excision:    Tubulovillous adenoma with focal high-grade dysplasia   Eleven lymph nodes negative for metastatic carcinoma (0/11).       Comment:   Pursuant to the Quality Assurance Program at the [facility] Pathology Department, selected slides from this case have been   concurrently reviewed by the following pathologist: Dr. Juanetta Beets   who   agrees with the final diagnosis.       CASE SUMMARY: (COLON AND RECTUM: Resection)   Standard(s): AJCC-UICC 8   CAP Version: Colon and Rectum Resection 4.3.0.0     SPECIMEN     Procedure   ___ Subtotal (extended) left abdominal colectomy     Macroscopic Evaluation of Mesorectum (required for rectal cancers)   ___ Not applicable     TUMOR     Tumor Site (select all that apply)   ___ Transverse colon/Splenic flexure     Histologic Type   ___ Adenocarcinoma     Histologic Grade   ___ G2, moderately differentiated     Tumor Size   ___ Greatest dimension in Centimeters (cm): 4.0 x 3.8 x 0.7 cm     Multiple Primary Sites (e.g., hepatic flexure and transverse colon)   ___ Not applicable (no additional primary site(s) present)     Tumor Extent   ___ Invades through muscularis propria into the pericolonic or  perirectal   tissue     Sub-mucosal Invasion (required only for pT1 tumors)   ___ Not applicable (not a pT1 tumor)     Macroscopic Tumor Perforation   ___ Not identified     Lymphatic and / or Vascular Invasion(select all that apply)   ___ Present (not otherwise specified)     Perineural Invasion   ___ Present     Tumor Budding Score   ___ Intermediate (5-9)     Treatment Effect   ___ No known presurgical therapy     MARGINS     Margin Status for Invasive Carcinoma   ___ All margins negative for invasive carcinoma   +Closest Margin(s) to Invasive Carcinoma (select all that apply)   ___ Radial (circumferential)/Mesenteric   +Distance from Invasive Carcinoma to Closest Margin Specify in Centimeters (cm)   ___ Exact distance in cm: 3.4 cm   Distance from Invasive Carcinoma to Radial (Circumferential) Margin   (required for rectal   tumors)   ___ Distance already reported as closest margin.   +Distance from Invasive Carcinoma to Distal Margin (recommended for rectal   tumors)   Specify in Centimeters (cm)   ___ Exact distance in cm: 58.0 cm   Margin(s) Involved by Invasive Carcinoma (select all that apply)   ___ Not applicable     Margin Status for Non-Invasive Tumor (select all that apply)   ___ All margins negative for high-grade dysplasia / intramucosal carcinoma   and low-grade dysplasia     REGIONAL LYMPH NODES     Regional Lymph Node Status   ___ Regional lymph nodes present   ___ Tumor present in regional lymph nodes   Number of Lymph Nodes with Tumor   ___ Exact number (specify): 5   Number of Lymph Nodes Examined   ___ Exact number (specify): 34     Tumor Deposits   ___ Not identified     DISTANT METASTASIS     Distant Site(s) Involved, if applicable (select all that apply)   ___ Not applicable     pTNM CLASSIFICATION (AJCC 8th Edition)   Reporting of pT, pN, and (when applicable) pM categories is based on   information available to the pathologist at the time the report is issued.   As per the AJCC (Chapter 1, 8th Ed.) it is the managing physician's   responsibility to establish the final pathologic stage based upon all   pertinent information, including but potentially not limited to this   pathology report.     Modified Classification (required only if applicable) (select all that   apply)   ___ Not applicable     pT Category   ___ pT3: Tumor invades through the muscularis propria into pericolorectal   tissues     T Suffix (required only if applicable)   ___ Not applicable     pN Category   pN2: Four or more regional nodes are positive   ___ pN2a: Four to six regional lymph nodes are positive     pM Category (required only if confirmed pathologically)   ___ Not applicable - pM cannot be determined from the submitted   specimen(s)     ADDITIONAL FINDINGS     +Additional Findings (select all that apply)   ___ Adenoma(s) (Tubulovillous adenoma with high grade dysplasia, 6.3 cm.   1.9 cm., rectosigmoid; tubular adenomas)      Latest Reference Range & Units 10/26/22 08:24   Hemoglobin 12.0 - 15.0 GM/DL 16.1 (L)   Hematocrit  36 - 45 % 33.1 (L)   Platelet Count 150 - 400 K/UL 198   White Blood Cells 4.5 - 11.0 K/UL 5.3   Neutrophils 41 - 77 % 68   Absolute Neutrophil Count 1.8 - 7.0 K/UL 3.70   Lymphocytes 24 - 44 % 18 (L)   Absolute Lymph Count 1.0 - 4.8 K/UL 0.90 (L)   Monocytes 4 - 12 % 9   Absolute Monocyte Count 0 - 0.80 K/UL 0.50   Eosinophils 0 - 5 % 4   Absolute Eosinophil Count 0 - 0.45 K/UL 0.20   Absolute Basophil Count 0 - 0.20 K/UL 0.10   Basophils 0 - 2 % 1   RBC 4.0 - 5.0 M/UL 4.08   MCV 80 - 100 FL 81.3   MCH 26 - 34 PG 27.2   MCHC 32.0 - 36.0 G/DL 26.9   MPV 7 - 11 FL 6.7 (L)   RDW 11 - 15 % 17.7 (H)   Sodium 137 - 147 MMOL/L 138   Potassium 3.5 - 5.1 MMOL/L 4.0   Chloride 98 - 110 MMOL/L 101   CO2 21 - 30 MMOL/L 29   Anion Gap 3 - 12  8   Blood Urea Nitrogen 7 - 25 MG/DL 9   Creatinine 0.4 - 4.85 MG/DL 4.62 (H)   eGFR >70 mL/min >60   Glucose 70 - 100 MG/DL 350 (H)   Albumin 3.5 - 5.0 G/DL 3.9   Calcium 8.5 - 09.3 MG/DL 9.6   Total Bilirubin 0.2 - 1.3 MG/DL 0.7   Total Protein 6.0 - 8.0 G/DL 7.1   AST (SGOT) 7 - 40 U/L 31   ALT (SGPT) 7 - 56 U/L 13   Alk Phosphatase 25 - 110 U/L 43   (L): Data is abnormally low  (H): Data is abnormally high     Assessment and Plan:    47 yr old female with history of right breast cancer (07/2019): ER/PR+, HER2- IDC pT1c N1a M0 (1 of 34 LN+) M0.  on  tamoxifen and zoladex. Who has newly diagnosed left colon adenocarcinoma s/P surgery: pT3N2 (LN 5/48) mod diff , positive for  Lymphatic and / or Vascular Invasion, and Perineural Invasion.   Here for consultation about adjuvant chemotherapy. Overall recovering from surgery reasonable well, only complaint is mild intermittent lightheadedness, no F/C/N/V/D, no pain, no SOB, no DOE.   Accompanied by her husband.     PS ECOG 0    Reviewed images and pathology with the pt and husband about her stage of disease, the risk of MRD/recurrence of disease, the rationale/mechanism of adjuvant treatment, risk vs benefit, regimen of FOLFOX (a schedule, timing, length), and potential side effects. (Info given and consent done)   Also discussed the surveillance   We discussed the study of ctDNA (C-14), the pt is interested in    10/26/2022  Cont follow up with new labs, clinically doing well, tolerate the treatment well, occasional dizziness \, no F/C/N/V/D,     Lab reviewed    -- cont  mFOLFOX (cycle 5)  -- will give 1L fluid hydration.  -- continue follow breast cancer care team  -- follow with surgery team  -- call us for issues    RTC in 2 week for consideration of next cycle      The patient had a chance to ask questions, all been answered to her statisfaction.       Miguel Rota, MD, Jer[facility] Ivory

## 2022-10-26 NOTE — Patient Instructions
Dr. Miguel Rota and Lianne Moris, APRN-NP  Nurses: Lucillie Garfinkel, BSN, RN & Erenest Rasher, BSN, RN  Phone: [phone]   Fax: [phone]  Available Monday - Friday 8:00 am - 4:00 pm    SCHEDULING NEEDS: [phone]    On-call number for urgent needs outside of business hours: [phone]  Ask for the on-call Oncology fellow to be paged and they will call you back.    Medication refills: Please contact your pharmacy FIRST for medication refills; if they do not have any refills on file, they will contact the office. Make sure they have our correct contact information on file.  P: [phone], F: [phone]    MyChart Messages: All messages come to the nurse and we discuss with Dr. Wynelle Link and Lianne Moris, APRN. Please make sure to select Dr. Wynelle Link or Denny Peon when sending a message. Messages are answered 8:00 am - 4:00 pm Monday - Friday, holidays excluded. Please keep in mind we have 4 hours to respond to your messages.    Phone Calls: Our phone number is strictly a Camera operator. We check these messages all day and will get back with you. Please state your full name, date of birth, and reason for your call with as much detail as possible. Please keep in mind we have 4 hours to return your call.    FMLA/Insurance/Disability paperwork: Please fill out as much of the paperwork as you are able. We request that you give [place] 7-10 business days to complete this as we have a high volume of paperwork to complete for our patients.

## 2022-10-28 ENCOUNTER — Encounter: Admit: 2022-10-28 | Discharge: 2022-10-28 | Payer: 59

## 2022-10-28 DIAGNOSIS — C186 Malignant neoplasm of descending colon: Secondary | ICD-10-CM

## 2022-10-28 MED ORDER — SODIUM CHLORIDE 0.9 % IV SOLP
1000 mL | Freq: Once | INTRAVENOUS | 0 refills | Status: AC
Start: 2022-10-28 — End: ?

## 2022-10-28 NOTE — Progress Notes
Pt here for CADD unhook and IVFs. Pt declines IVFs today, states she pushing oral hydration. She denies dizziness today, she did feel some dizziness yesterday. She also denies vomiting and diarrhea. 5.9 ml remaining in Fluorouracil infusion, remainder given in bolus as ordered. CADD pump disconnected from port. Port saline locked then de-accessed. Pt discharged in stable condition. To return as scheduled.

## 2022-11-01 ENCOUNTER — Encounter: Admit: 2022-11-01 | Discharge: 2022-11-01 | Payer: 59

## 2022-11-02 ENCOUNTER — Encounter: Admit: 2022-11-02 | Discharge: 2022-11-02 | Payer: 59

## 2022-11-02 DIAGNOSIS — C186 Malignant neoplasm of descending colon: Secondary | ICD-10-CM

## 2022-11-02 MED ORDER — FEROSUL 325 MG (65 MG IRON) PO TAB
ORAL_TABLET | 0 refills
Start: 2022-11-02 — End: ?

## 2022-11-02 MED ORDER — PROCHLORPERAZINE MALEATE 10 MG PO TAB
10 mg | ORAL_TABLET | ORAL | 2 refills | Status: AC | PRN
Start: 2022-11-02 — End: ?

## 2022-11-02 MED ORDER — ONDANSETRON HCL 8 MG PO TAB
8 mg | ORAL_TABLET | ORAL | 2 refills | 8.00000 days | Status: AC | PRN
Start: 2022-11-02 — End: ?

## 2022-11-05 ENCOUNTER — Encounter: Admit: 2022-11-05 | Discharge: 2022-11-05 | Payer: 59

## 2022-11-05 DIAGNOSIS — E039 Hypothyroidism, unspecified: Secondary | ICD-10-CM

## 2022-11-05 DIAGNOSIS — C186 Malignant neoplasm of descending colon: Secondary | ICD-10-CM

## 2022-11-05 DIAGNOSIS — C189 Malignant neoplasm of colon, unspecified: Secondary | ICD-10-CM

## 2022-11-06 ENCOUNTER — Encounter: Admit: 2022-11-06 | Discharge: 2022-11-06 | Payer: 59

## 2022-11-08 NOTE — Progress Notes
Name: Jennifer Durham          MRN: [id number]      DOB: [birth year]      AGE: 47 y.o.   DATE OF SERVICE: 11/09/2022    Subjective:             Reason for Visit:  Follow Up      Jennifer Durham is a 47 y.o. female.      Cancer Staging   Colon cancer [facility] [place] [facility])  Staging form: Colon And Rectum, AJCC 8th Edition  - Clinical stage from 07/23/2022: Stage IIIB (cT3, cN2a, cM0) - Signed by Miguel Rota, MD on 08/11/2022    Malignant neoplasm of upper-outer quadrant of right breast in female, estrogen receptor positive (HCC)  Staging form: Breast, AJCC 8th Edition  - Clinical stage from 07/04/2019: Stage IA (cT1c, cN0(f), cM0, G2, ER+, PR+, HER2-) - Signed by Guy Begin, PA-C on 07/12/2019  - Pathologic stage from 08/21/2019: Stage IA (pT1c, pN1a, cM0, G2, ER+, PR+, HER2-) - Signed by Guy Begin, PA-C on 08/30/2019      History of Present Illness    47 y.o. female with hx of stage IA ER/PR+, HER2- IDC of the right breast diagnosed in 06/2019 after detecting dimpling of her breast in 05/2019. She presented for mammogram which identified a spiculated mass in the upper right medial breast. She underwent ultrasound and biopsy that confirmed carcinoma . Pathology review at [facility] confirmed grade 2 IDC with a focal lobular growth pattern and lymphoid tissue with no evidence of carcinoma in the right axillary specimen. She was recommended to undergo upfront surgery. On 08/01/2019, she underwent bilateral skin sparing mastectomy and right sentinel lymph node biopsy, axilla lymph node dissection, and reconstruction.  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  A 5 mm focus of metastatic carcinoma was found in 1 of 4 sentinel lymph nodes.  0 of 27 additional axillary nodes were involved. pT1c N1a M0 (1 of 34 LN+) M0. No LVI, No EIC, No ENE. She was recommended to receive adjuvant chemotherapy with TC x4 cycles,  underwent adjuvant radiotherapy to her right reconstructed breast, chest wall and regional nodes to a dose of 4526 cGy in 16 fractions from 02/19/2020 - 03/11/2020.      She is on tamoxifen and zoladex.     history of hypertension, polycystic ovarian disease, and prediabetesa colonoscopy     She had a colonoscopy performed on May 29th, 2024. The colonoscopy revealed a moderately differentiated adenocarcinoma suspected to be at the splenic flexure, 60 cm from the anal verge, and a large rectosigmoid polyp described as pedunculated. The polyp was biopsied but not removed and was identified as a tubulovillous adenoma. CT of the chest, abdomen, and pelvis, which showed no evidence of metastatic disease. The patient's CEA level was 0.8, and she was found to be microsatellite stable.     07/23/2022  Extended left hemi colectomy: pT3N2 (LN 5/48) mod diff adenocarcinoma, Lymphatic and / or Vascular Invasion: Present; Perineural Invasion--Present           Interval History     The patient presents for her next chemotherapy. She reports ongoing nausea and a tingling sensation. She reports feeling very puky today, but her labs look good. She has been managing her nausea with Zofran in the morning and occasionally another dose later in the day if needed. She also reports a tingling sensation  in her forearms and calves, but not in her hands and feet. The cold sensitivity is primarily in her hands. She has been managing these symptoms and continues to work, despite the discomfort. She also reports bowel movements are regular with the help of Imodium.         Review of Systems   Constitutional:  Positive for fatigue. Negative for activity change, appetite change, chills, diaphoresis, fever and unexpected weight change.   HENT:  Negative for facial swelling, mouth sores, sore throat and trouble swallowing.         Sore in nostril   Eyes: Negative.    Respiratory: Negative.  Negative for cough, chest tightness, shortness of breath and wheezing.    Cardiovascular: Negative.  Negative for chest pain, palpitations and leg swelling.   Gastrointestinal:  Positive for constipation, diarrhea and nausea. Negative for abdominal distention, abdominal pain, anal bleeding, blood in stool, rectal pain and vomiting.   Endocrine: Positive for cold intolerance.   Genitourinary: Negative.    Musculoskeletal: Negative.  Negative for arthralgias, back pain, neck pain and neck stiffness.   Skin: Negative.  Negative for color change, pallor, rash and wound.   Neurological:  Positive for numbness. Negative for dizziness, weakness, light-headedness and headaches.   Psychiatric/Behavioral: Negative.             Objective:          carvediloL (COREG) 6.25 mg tablet Take one tablet by mouth twice daily with meals. Take with food.    CHOLEcalciferoL (vitamin D3) (OPTIMAL D3) 50,000 units capsule Take one capsule by mouth every 7 days. 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    escitalopram oxalate (LEXAPRO) 10 mg tablet TAKE ONE TABLET BY MOUTH EVERY DAY    ferrous sulfate (IRON) 325 mg (65 mg iron) tablet Take one tablet by mouth daily. Take on an empty stomach at least 1 hour before or 2 hours after food.    INTRAUTERINE DEVICE (IUD) IU by Intrauterine route.    levothyroxine (SYNTHROID) 50 mcg tablet Take one tablet by mouth daily.    lidocaine/prilocaine (EMLA) 2.5/2.5 % topical cream Apply pea sized amount of cream in a thick layer over port site 45 minutes prior to access, cover with transparent dressing    loperamide (IMODIUM A-D) 2 mg capsule TAKE ONE CAPSULE BY MOUTH THREE TIMES DAILY, 30-45 MINUTES prior to meals    LORazepam (ATIVAN) 0.5 mg tablet Take 1 tab by mouth every 6 hrs as needed N/V not controlled by Zofran or Compazine. May also use every 6 hrs as needed anxiety or at bedtime insomnia.    losartan (COZAAR) 25 mg tablet Take one-half tablet by mouth daily.    metFORMIN (GLUCOPHAGE) 1,000 mg tablet Take one tablet by mouth daily with breakfast.    montelukast (SINGULAIR) 10 mg tablet Take one tablet by mouth at bedtime daily.    OLANZapine (ZYPREXA) 5 mg tablet Take one tablet by mouth at bedtime daily. Indications: nausea and vomiting caused by cancer drugs    ondansetron HCL (ZOFRAN) 8 mg tablet Take one tablet by mouth every 8 hours as needed for Nausea or Vomiting. Avoid on Days 1-2 of each chemotherapy cycle.    prochlorperazine maleate (COMPAZINE) 10 mg tablet Take  one tablet by mouth every 6 hours as needed for Nausea or Vomiting.    rosuvastatin (CRESTOR) 5 mg tablet Take one tablet by mouth daily.    tamoxifen (NOLVADEX) 20 mg tablet TAKE ONE TABLET BY MOUTH EVERY NIGHT AT BEDTIME    tirzepatide (MOUNJARO) 7.5 mg/0.5 mL injector PEN Inject 0.5 mL under the skin every 7 days. wednesday    traMADoL (ULTRAM) 50 mg tablet Take one tablet to two tablets by mouth every 6 hours as needed. Indications: pain     Vitals:    11/09/22 1131   BP: 106/79   BP Source: Arm, Left Upper   Pulse: 91   Temp: 36.4 ?C (97.5 ?F)   Resp: 16   SpO2: 99%   TempSrc: Temporal   PainSc: Zero   Weight: 125.4 kg (276 lb 6.4 oz)           Body mass index is 37.93 kg/m?Marland Kitchen     Pain Score: Zero       Fatigue Scale: 5    Pain Addressed:  N/A    Patient Evaluated for a Clinical Trial: Patient currently in screening for a treatment clinical trial.     [employer] Co[employer] Oncology Group performance status is 0, Fully active, able to carry on all pre-disease performance without restriction.Marland Kitchen     Physical Exam  Vitals reviewed.   Constitutional:       General: She is not in acute distress.     Appearance: Normal appearance. She is well-developed. She is not ill-appearing, toxic-appearing or diaphoretic.   HENT:      Head: Normocephalic and atraumatic.      Nose: Nose normal. No rhinorrhea.      Mouth/Throat:      Mouth: Mucous membranes are moist. Mucous membranes are not pale. No oral lesions.      Pharynx: Oropharynx is clear. No oropharyngeal exudate or posterior oropharyngeal erythema.      Tonsils: No tonsillar abscesses.   Eyes:      General: No scleral icterus.        Right eye: No discharge.         Left eye: No discharge.      Extraocular Movements: Extraocular movements intact.      Conjunctiva/sclera: Conjunctivae normal.      Pupils: Pupils are equal, round, and reactive to light.   Cardiovascular:      Rate and Rhythm: Normal rate and regular rhythm.      Pulses: Normal pulses.      Heart sounds: Normal heart sounds. No murmur heard.     No gallop.   Pulmonary:      Effort: Pulmonary effort is normal. No respiratory distress.      Breath sounds: Normal breath sounds. No stridor. No wheezing, rhonchi or rales.   Chest:      Chest wall: No tenderness.   Abdominal:      General: Abdomen is flat. A surgical scar is present. Bowel sounds are normal. There is no distension.      Palpations: Abdomen is soft. There is no mass.      Tenderness: There is no abdominal tenderness. There is no guarding or rebound.      Hernia: No hernia is present.      Comments: With palpation   Musculoskeletal:         General: No swelling, tenderness, deformity or signs of injury. Normal range of motion.      Cervical back: Normal range of motion  and neck supple. No rigidity. No muscular tenderness.      Right lower leg: No edema.      Left lower leg: No edema.   Lymphadenopathy:      Cervical: No cervical adenopathy.   Skin:     General: Skin is warm and dry.      Coloration: Skin is not jaundiced or pale.      Findings: No bruising, erythema, lesion or rash.   Neurological:      General: No focal deficit present.      Mental Status: She is alert and oriented to person, place, and time. Mental status is at baseline.      Cranial Nerves: No cranial nerve deficit.      Motor: No weakness. Coordination: Coordination normal.      Gait: Gait normal.   Psychiatric:         Mood and Affect: Mood normal.         Behavior: Behavior normal.         Thought Content: Thought content normal.         Judgment: Judgment normal.          07/23/2022  Pathology    Final Diagnosis:     A. 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      B. Colon, extended left hemi colectomy:    Adenocarcinoma moderately differentiated. See comment.   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 Additional proximal margin#1, excision:   Colon tissue with Tubular adenomas. See comment.   Three lymph nodes negative for metastatic carcinoma (0/3).     D. Additional proximal margins #2, excision:    Tubulovillous adenoma with focal high-grade dysplasia   Eleven lymph nodes negative for metastatic carcinoma (0/11).       Comment:   Pursuant to the Quality Assurance Program at the [facility] Pathology Department, selected slides from this case have been   concurrently reviewed by the following pathologist: Dr. Juanetta Beets   who   agrees with the final diagnosis.       CASE SUMMARY: (COLON AND RECTUM: Resection)   Standard(s): AJCC-UICC 8   CAP Version: Colon and Rectum Resection 4.3.0.0     SPECIMEN     Procedure   ___ Subtotal (extended) left abdominal colectomy     Macroscopic Evaluation of Mesorectum (required for rectal cancers)   ___ Not applicable     TUMOR     Tumor Site (select all that apply)   ___ Transverse colon/Splenic flexure     Histologic Type   ___ Adenocarcinoma     Histologic Grade   ___ G2, moderately differentiated     Tumor Size   ___ Greatest dimension in Centimeters (cm): 4.0 x 3.8 x 0.7 cm     Multiple Primary Sites (e.g., hepatic flexure and transverse colon)   ___ Not applicable (no additional primary site(s) present)     Tumor Extent   ___ Invades through muscularis propria into the pericolonic or perirectal   tissue     Sub-mucosal Invasion (required only for pT1 tumors)   ___ Not applicable (not a pT1 tumor)     Macroscopic Tumor Perforation   ___ Not identified     Lymphatic and / or Vascular Invasion(select all that apply)  ___ Present (not otherwise specified)     Perineural Invasion   ___ Present     Tumor Budding Score   ___ Intermediate (5-9)     Treatment Effect   ___ No known presurgical therapy     MARGINS     Margin Status for Invasive Carcinoma   ___ All margins negative for invasive carcinoma   +Closest Margin(s) to Invasive Carcinoma (select all that apply)   ___ Radial (circumferential)/Mesenteric   +Distance from Invasive Carcinoma to Closest Margin   Specify in Centimeters (cm)   ___ Exact distance in cm: 3.4 cm   Distance from Invasive Carcinoma to Radial (Circumferential) Margin   (required for rectal   tumors)   ___ Distance already reported as closest margin.   +Distance from Invasive Carcinoma to Distal Margin (recommended for rectal   tumors)   Specify in Centimeters (cm)   ___ Exact distance in cm: 58.0 cm   Margin(s) Involved by Invasive Carcinoma (select all that apply)   ___ Not applicable     Margin Status for Non-Invasive Tumor (select all that apply)   ___ All margins negative for high-grade dysplasia / intramucosal carcinoma   and low-grade dysplasia     REGIONAL LYMPH NODES     Regional Lymph Node Status   ___ Regional lymph nodes present   ___ Tumor present in regional lymph nodes   Number of Lymph Nodes with Tumor   ___ Exact number (specify): 5   Number of Lymph Nodes Examined   ___ Exact number (specify): 34     Tumor Deposits   ___ Not identified     DISTANT METASTASIS     Distant Site(s) Involved, if applicable (select all that apply)   ___ Not applicable     pTNM CLASSIFICATION (AJCC 8th Edition)   Reporting of pT, pN, and (when applicable) pM categories is based on   information available to the pathologist at the time the report is issued.   As per the AJCC (Chapter 1, 8th Ed.) it is the managing physician's   responsibility to establish the final pathologic stage based upon all   pertinent information, including but potentially not limited to this   pathology report.     Modified Classification (required only if applicable) (select all that   apply)   ___ Not applicable     pT Category   ___ pT3: Tumor invades through the muscularis propria into pericolorectal   tissues     T Suffix (required only if applicable)   ___ Not applicable     pN Category   pN2: Four or more regional nodes are positive   ___ pN2a: Four to six regional lymph nodes are positive     pM Category (required only if confirmed pathologically)   ___ Not applicable - pM cannot be determined from the submitted   specimen(s)     ADDITIONAL FINDINGS     +Additional Findings (select all that apply)   ___ Adenoma(s) (Tubulovillous adenoma with high grade dysplasia, 6.3 cm.   1.9 cm., rectosigmoid; tubular adenomas)      Latest Reference Range & Units 11/09/22 10:30   Hemoglobin 12.0 - 15.0 GM/DL 34.7 (L)   Hematocrit 36 - 45 % 32.7 (L)   Platelet Count 150 - 400 K/UL 184   White Blood Cells 4.5 - 11.0 K/UL 5.3   Neutrophils 41 - 77 % 70   Absolute Neutrophil Count 1.8 - 7.0 K/UL 3.70   Lymphocytes 24 - 44 %  20 (L)   Absolute Lymph Count 1.0 - 4.8 K/UL 1.00   Monocytes 4 - 12 % 7   Absolute Monocyte Count 0 - 0.80 K/UL 0.40   Eosinophils 0 - 5 % 2   Absolute Eosinophil Count 0 - 0.45 K/UL 0.10   Absolute Basophil Count 0 - 0.20 K/UL 0.10   Basophils 0 - 2 % 1   RBC 4.0 - 5.0 M/UL 3.99 (L)   MCV 80 - 100 FL 81.9   MCH 26 - 34 PG 27.0   MCHC 32.0 - 36.0 G/DL 16.1   MPV 7 - 11 FL 7.0   RDW 11 - 15 % 17.9 (H)   Sodium 137 - 147 MMOL/L 138   Potassium 3.5 - 5.1 MMOL/L 4.2   Chloride 98 - 110 MMOL/L 101   CO2 21 - 30 MMOL/L 30   Anion Gap 3 - 12  7   Blood Urea Nitrogen 7 - 25 MG/DL 8   Creatinine 0.4 - 0.96 MG/DL 0.45 (H)   eGFR >40 mL/min >60 Glucose 70 - 100 MG/DL 981 (H)   Albumin 3.5 - 5.0 G/DL 3.6   Calcium 8.5 - 19.1 MG/DL 9.2   Total Bilirubin 0.2 - 1.3 MG/DL 0.6   Total Protein 6.0 - 8.0 G/DL 6.9   AST (SGOT) 7 - 40 U/L 21   ALT (SGPT) 7 - 56 U/L 8   Alk Phosphatase 25 - 110 U/L 39   CEA <3.0 NG/ML 0.3   (L): Data is abnormally low  (H): Data is abnormally high         Assessment and Plan:  47 yr old female with history of right breast cancer (07/2019): ER/PR+, HER2- IDC pT1c N1a M0 (1 of 34 LN+) M0.  on  tamoxifen and zoladex. Who has newly diagnosed left colon adenocarcinoma s/P: pT3N2 (LN 5/48) mod diff , positive for  Lymphatic and / or Vascular Invasion, and Perineural Invasion.   Here for consultation about adjuvant chemotherapy. Overall recovering from surgery reasonable well, only complaint is mild intermittent lightheadedness, no F/C/N/V/D, no pain, no SOB, no DOE.     Reviewed images and pathology with the pt and husband about her stage of disease, the risk of MRD/recurrence of disease, the rationale/mechanism of adjuvant treatment, risk vs benefit, regimen of FOLFOX (a schedule, timing, length), and potential side effects. (Info given and consent done)   Also discussed the surveillance schedule and MRD study of ctDNA (C-14), the pt agreed    Patient is currently on [id number] using adjusted body weight. Started 08/31/22      Plan:          Tolerating FOLFOX well with manageable nausea and occasional diarrhea with same doses/adjusted body weight  Cold sensitivity lasting longer than previous cycles.   Start Olanzapine at bedtime for prevention of CINV. Continue Zofran as needed. Consider taking Ativan before chemotherapy sessions to manage anticipatory nausea.  Cold sensitivity in hands and tingling sensation in forearms and calves. No significant functional impairment reported. continue oxaliplatin and monitor symptoms.  Due to national fluid shortage, patient will receive fluids only on the day of pump removal. Continue to monitor hydration status.  Labs are within normal limits.  proceed with C6 adjuvant FOLFOX  RTC in 2 weeks with labs and visit for C7  Discuss imaging with pt at next appointment; midway point of adjuvant therapy  continue supportive measures.   Pt to call with any questions, concerns or symptoms at any time  and we can see her sooner.           Turner Daniels, APRN-NP        The patient and family were allowed to ask questions and voice concerns; these were addressed to the best of our ability. They expressed understanding of what was explained to them, and they agreed with the present plan. RTC in 2 weeks with labs to see a provider. Patient has the phone numbers for the Cancer Center and was instructed on how to contact us with any questions or concerns. My collaborating physician on this case is Dr. Wynelle Link

## 2022-11-09 ENCOUNTER — Encounter: Admit: 2022-11-09 | Discharge: 2022-11-09 | Payer: 59

## 2022-11-09 DIAGNOSIS — Z789 Other specified health status: Secondary | ICD-10-CM

## 2022-11-09 DIAGNOSIS — J302 Other seasonal allergic rhinitis: Secondary | ICD-10-CM

## 2022-11-09 DIAGNOSIS — E88819 Insulin resistance: Secondary | ICD-10-CM

## 2022-11-09 DIAGNOSIS — R000 Tachycardia, unspecified: Secondary | ICD-10-CM

## 2022-11-09 DIAGNOSIS — C189 Malignant neoplasm of colon, unspecified: Secondary | ICD-10-CM

## 2022-11-09 DIAGNOSIS — R12 Heartburn: Secondary | ICD-10-CM

## 2022-11-09 DIAGNOSIS — E282 Polycystic ovarian syndrome: Secondary | ICD-10-CM

## 2022-11-09 DIAGNOSIS — T7840XA Allergy, unspecified, initial encounter: Secondary | ICD-10-CM

## 2022-11-09 DIAGNOSIS — C50919 Malignant neoplasm of unspecified site of unspecified female breast: Secondary | ICD-10-CM

## 2022-11-09 DIAGNOSIS — F3289 Other specified depressive episodes: Secondary | ICD-10-CM

## 2022-11-09 DIAGNOSIS — F32A Depression: Secondary | ICD-10-CM

## 2022-11-09 DIAGNOSIS — E785 Hyperlipidemia, unspecified: Secondary | ICD-10-CM

## 2022-11-09 DIAGNOSIS — I10 Essential (primary) hypertension: Secondary | ICD-10-CM

## 2022-11-09 DIAGNOSIS — Z923 Personal history of irradiation: Secondary | ICD-10-CM

## 2022-11-09 DIAGNOSIS — E669 Obesity, unspecified: Secondary | ICD-10-CM

## 2022-11-09 DIAGNOSIS — F419 Anxiety disorder, unspecified: Secondary | ICD-10-CM

## 2022-11-09 DIAGNOSIS — C186 Malignant neoplasm of descending colon: Secondary | ICD-10-CM

## 2022-11-09 DIAGNOSIS — E039 Hypothyroidism, unspecified: Secondary | ICD-10-CM

## 2022-11-09 LAB — CBC AND DIFF
ABSOLUTE BASO COUNT: 0.1 10*3/uL (ref 0–0.20)
ABSOLUTE EOS COUNT: 0.1 10*3/uL (ref 0–0.45)
ABSOLUTE LYMPH COUNT: 1 10*3/uL (ref 1.0–4.8)
ABSOLUTE MONO COUNT: 0.4 10*3/uL (ref 0–0.80)
ABSOLUTE NEUTROPHIL: 3.7 10*3/uL (ref 1.8–7.0)
BASOPHILS %: 1 % (ref 0–2)
EOSINOPHILS %: 2 % (ref >60)
HEMATOCRIT: 32 % — ABNORMAL LOW (ref 36–45)
LYMPHOCYTES %: 20 % — ABNORMAL LOW (ref 24–44)
MCH: 27 pg (ref 26–34)
MCHC: 32 g/dL — ABNORMAL LOW (ref >60)
MCV: 81 FL — AB (ref 80–100)
MONOCYTES %: 7 % (ref 4–12)
MPV: 7 FL (ref 7–11)
NEUTROPHILS %: 70 % (ref 41–77)
PLATELET COUNT: 184 10*3/uL (ref 150–400)
RBC COUNT: 3.9 M/UL — ABNORMAL LOW (ref 4.0–5.0)
RDW: 17 % — ABNORMAL HIGH (ref 11–15)
WBC COUNT: 5.3 10*3/uL (ref 4.5–11.0)

## 2022-11-09 LAB — COMPREHENSIVE METABOLIC PANEL
POTASSIUM: 4.2 MMOL/L (ref 3.5–5.1)
SODIUM: 138 MMOL/L — ABNORMAL HIGH (ref 137–147)

## 2022-11-09 LAB — CEA(CARCINOEMBRYONIC AG): CEA: 0.3 ng/mL — ABNORMAL LOW (ref <3.0)

## 2022-11-09 MED ORDER — PALONOSETRON 0.25 MG/5 ML IV SOLN
.25 mg | Freq: Once | INTRAVENOUS | 0 refills | Status: CP
Start: 2022-11-09 — End: ?

## 2022-11-09 MED ORDER — PALONOSETRON 0.25 MG/5 ML IV SOLN
.25 mg | Freq: Once | INTRAVENOUS | 0 refills
Start: 2022-11-09 — End: ?

## 2022-11-09 MED ORDER — LORAZEPAM 0.5 MG PO TAB
.5 mg | Freq: Once | ORAL | 0 refills | Status: CP
Start: 2022-11-09 — End: ?

## 2022-11-09 MED ORDER — SODIUM CHLORIDE 0.9 % IV SOLP
500 mL | Freq: Once | INTRAVENOUS | 0 refills
Start: 2022-11-09 — End: ?

## 2022-11-09 MED ORDER — DEXAMETHASONE 4 MG PO TAB
4 mg | Freq: Once | ORAL | 0 refills
Start: 2022-11-09 — End: ?

## 2022-11-09 MED ORDER — DEXAMETHASONE 4 MG PO TAB
4 mg | Freq: Once | ORAL | 0 refills | Status: CP
Start: 2022-11-09 — End: ?

## 2022-11-09 MED ORDER — FLUOROURACIL IV AMB PUMP
2000 mg/m2 | Freq: Once | INTRAVENOUS | 0 refills
Start: 2022-11-09 — End: ?

## 2022-11-09 MED ORDER — OLANZAPINE 5 MG PO TAB
5 mg | ORAL_TABLET | Freq: Every evening | ORAL | 3 refills | 30.00000 days | Status: AC
Start: 2022-11-09 — End: ?

## 2022-11-09 MED ORDER — OXALIPLATIN IVPB
85 mg/m2 | Freq: Once | INTRAVENOUS | 0 refills | Status: CP
Start: 2022-11-09 — End: ?

## 2022-11-09 MED ORDER — FLUOROURACIL IV AMB PUMP
2000 mg/m2 | Freq: Once | INTRAVENOUS | 0 refills | Status: CP
Start: 2022-11-09 — End: ?

## 2022-11-09 NOTE — Research Notes
Protocol Name:[id number] C-14   Study Title: CORRECT-MRD II: Second Colorectal Cancer Clinical Validation Study to Predict Recurrence Using a Circulating Tumor DNA Assay to Detect Minimal Residual Disease  KVQ#[id number]  Baseline Weight: 130.3 kg  Today's Visit: Visit 2 Blood Draw (V02)        Participant present for Blood Draw (V02) visit. There were no adverse events associated with the lab draw. Blood was drawn via port.     Participant was reimbursed via [employer].      Next due lab draw is 02/13/2022 +/- 30 days.     Participant will return to clinic for in 90 days for lab draw.

## 2022-11-09 NOTE — Progress Notes
NAME: Jennifer Durham  MRN: [id number]  DOB: Dec 21, 1975    [facility]   Genetic Counseling  Visit Date: 11/17/2022    Referring physician: Miguel Rota, MD    Indication   Jennifer Durham, 47 y.o., was seen via telehealth for discussion of her diagnosis of colon cancer and family history of cancer. [name] was physically located in [place] for today's visit.    Summary of Session   Based on NCCN guidelines, [name] meets the clinical criteria for multi-gene panel testing but her previous testing was comprehensive to known personal and family history of cancer. Updated testing was discussed and offered to [name], including a non-insurance $250 patient-pay option. Her genetic testing results could affect her clinical management recommendations.    After discussing the risks, benefits, and limitations of genetic testing, [name] chose not top proceed with testing.    Prior Building control surveyor   [name] had the previous testing performed in 2021:   [facility] MyRisk Panel: 35 genes with susceptibility to cancer including: APC, ATM, AXIN2, BARD1, BMPR1A, BRCA1, BRCA2, BRIP1, CDH1, CDK4, CDKN2A, CHEK2, EPCAM (large rearrangement only), HOXB13 (sequencing only), GALNT12, MLH1, MSH2, MSH3 (excluding repetitive portions of exon 1), MSH6, MUTYH, NBN, NTHL1, PALB2, PMS2, PTEN, RAD51C, RAD51D, RNF43, RPS20, SMAD4, STK11, TP53. Sequencing was performed for select regions of POLE and POLD1, and large rearrangement analysis was performed for select regions of GREM1.    Result   NEGATIVE - No known pathogenic variants (mutations) were identified.     The test report can be found in the patient's chart under the Media tab labeled Genetic Test.    Clinical History   Jennifer Durham was diagnosed with breast cancer at the age of 46 and was diagnosed with colon cancer at age 47.    Cancer Staging   Colon cancer [facility])  Staging form: Colon And Rectum, AJCC 8th Edition  - Clinical stage from 07/23/2022: Stage IIIB (cT3, cN2a, cM0) - Signed by Miguel Rota, MD on 08/11/2022    Malignant neoplasm of upper-outer quadrant of right breast in female, estrogen receptor positive (HCC)  Staging form: Breast, AJCC 8th Edition  - Clinical stage from 07/04/2019: Stage IA (cT1c, cN0(f), cM0, G2, ER+, PR+, HER2-) - Signed by Guy Begin, PA-C on 07/12/2019  - Pathologic stage from 08/21/2019: Stage IA (pT1c, pN1a, cM0, G2, ER+, PR+, HER2-) - Signed by Guy Begin, PA-C on 08/30/2019    Patient Reported Cancer Surveillance History:  Mammograms: most recent 06/2019, primarily self checks   Bilateral mastectomy 07/2019  Uterus / ovaries / fallopian tubes intact? yes  Pap smear: 2021  Colonoscopies: most recent 05/2022  Total # of polyps: 1 differentiated adenocarcinoma, large rectosigmoid tubulovillous adenoma   Upper endoscopy: none  Dermatology: no  Benign tumors: lipoma on the elbow    Past Medical History:   Diagnosis Date    Allergy     Anxiety     Anxiety disorder     Breast cancer (HCC) 07/04/2019    right    Cancer of colon (HCC) 06/23/22    Depression 11/25/2021    Depressive disorder, not elsewhere classified     Heartburn     Hx of radiation therapy 2022    Hyperlipemia 11/23    Hypertension     Hypothyroidism 09/2020    Insulin resistance     Limb alert care status     No venipuncture/BP's to RUE    Obesity     PCOS (polycystic ovarian syndrome)  Personal history of irradiation 02/2020    Seasonal allergies     Frequent sinusitis    Tachyarrhythmia     There were no other major medical concerns reported at today's appointment.      Surgical History:   Procedure Laterality Date    HX TONSILLECTOMY  2001    turbinates/adenoids 2015    HX TONSIL AND ADENOIDECTOMY  2011    LEFT PERONEAL TENDON REPAIR Left 12/15/2016    Performed by Tanja Port, MD at [facility] OR    Left Skin Sparing Mastectomy Left 08/21/2019    Performed by Renford Dills, MD at [facility] OR    IDENTIFICATION SENTINEL LYMPH NODE Right 08/21/2019    Performed by Renford Dills, MD at [facility] OR    INJECTION RADIOACTIVE TRACER FOR SENTINEL NODE IDENTIFICATION Right 08/21/2019    Performed by Renford Dills, MD at [facility] OR    RADIOLOGICAL EXAM SURGICAL SPECIMEN Right 08/21/2019    Performed by Renford Dills, MD at [facility] OR    MASTECTOMY - MODIFIED RADICAL WITH AXILLARY LYMPH NODE DISSECTION Right 08/21/2019    Performed by Renford Dills, MD at [facility] OR    RECONSTRUCTION BREAST WITH TISSUE EXPANDER AND SUBSEQUENT EXPANSION - IMMEDIATE/ DELAYED Bilateral 08/21/2019    Performed by Marlinda Mike, MD at [facility] OR    IMPLANTATION BIOLOGIC IMPLANT FOR SOFT TISSUE REINFORCEMENT Bilateral 08/21/2019    Performed by Marlinda Mike, MD at [facility] OR    RECONSTRUCTION BREAST WITH DEEP INFERIOR EPIGASTRIC PERFORATOR FLAP/ SUPERFICIAL INFERIOR EPIGASTRIC ARTERY FLAP Bilateral 07/02/2020    Performed by Marlinda Mike, MD at [facility] OR    RECONSTRUCTION BREAST WITH FREE FLAP - request 22 modifier, complicated procedure due to obesity and intramuscular course Bilateral 07/02/2020    Performed by Marlinda Mike, MD at [facility] OR    INTRAVENOUS INJECTION AGENT TO TEST VASCULAR FLOW IN FLAP/ GRAFT Bilateral 07/02/2020    Performed by Marlinda Mike, MD at [facility] OR    APPLICATION NEGATIVE PRESSURE WOUND THERAPY Bilateral 07/02/2020    Performed by Marlinda Mike, MD at [facility] OR    IMPLANTATION MESH CLOSURE OF DEBRIDEMENT FOR NECROTIZING SOFT TISSUE INFECTION Bilateral 07/02/2020    Performed by Marlinda Mike, MD at [facility] OR    REMOVAL TISSUE EXPANDER Bilateral 07/02/2020    Performed by Marlinda Mike, MD at [facility] OR    INCISION AND DRAINAGE HEMATOMA/ SEROMA - TORSO N/A 08/05/2020    Performed by Marlinda Mike, MD at [facility] OR    REPAIR INTERMEDIATE WOUND 12.6 CM TO 20.0 CM - TRUNK - total length 24cm  08/05/2020    Performed by Marlinda Mike, MD at [facility] OR    GRAFTING AUTOLOGOUS FAT BY LIPOSUCTION TO TRUNK/ BREASTS/ SCALP/ ARMS/ LEGS - 50 CC OR LESS 60cc to the R, 90cc to the L Bilateral 08/10/2021    Performed by Marlinda Mike, MD at [facility] OR    GRAFTING AUTOLOGOUS FAT BY LIPOSUCTION TO TRUNK/ BREASTS/ SCALP/ ARMS/ LEGS - EACH ADDITIONAL 50 CC Bilateral 08/10/2021    Performed by Marlinda Mike, MD at [facility] OR    REVISION RECONSTRUCTED BREAST Left 08/10/2021    Performed by Marlinda Mike, MD at [facility] OR    REPAIR INTERMEDIATE WOUND  2.5 CM OR LESS - TORSO abdominal dogear removal R side - 9cm, L side - 10cm N/A 08/10/2021    Performed by Marlinda Mike, MD at [facility]  OR    COLONOSCOPY DIAGNOSTIC WITH SPECIMEN COLLECTION BY BRUSHING/ WASHING - FLEXIBLE N/A 06/23/2022    Performed by Buckles, Vinnie Level, MD at [facility] OR    ROBOT ASSISTED LEFT COLECTOMY, CONVERTED TO OPEN N/A 07/23/2022    Performed by Benetta Spar, MD at [facility] OR    ROBOT ASSISTED SURGERY N/A 07/23/2022    Performed by Benetta Spar, MD at [facility] OR    BREAST SURGERY      CARDIOVASCULAR STRESS TEST      COSMETIC SURGERY      ECHOCARDIOGRAM PROCEDURE      ELECTROCARDIOGRAM          A multigenerational family history was obtained as part of today's visit. The pedigree is available under the Media tab in the patient's chart, titled Pedigree. The family history was conducted explicitly for individuals with cancers. Unless noted, the family history is otherwise negative for other genetic findings. Relevant diagnoses are listed below.  Family History       Relation Status Problems (Age of Onset) - (Comment)    Mother Deceased at age 48 Colon Polyps ;  Heart Attack ;  Diabetes ;  Cancer-Breast  (50 y) ;  Heart Disease ;  Hypertension ;  High Cholesterol ;  Sudden Cardiac Death    Father Alive, age 55y Heart Disease ;  Hypothyroid ;  Hypertension ;  Thyroid Disease ;  High Cholesterol    Brother Alive, age 21y Hypertension ;  Thyroid Disease    Maternal Aunt Deceased Cancer-Pancreas  (65 - 52 y) ;  Diabetes ;  Arthritis-rheumatoid    Maternal Grandmother Deceased Diabetes    Maternal Grandfather Deceased Heart Disease ;  Sudden Cardiac Death    Brother Alive, age 19y Thyroid Disease    Daughter Alive, age 59y     Son Alive, age 21y Paternal Grandfather Deceased     Paternal Grandmother Deceased     Paternal Aunt Deceased     Paternal Aunt Alive     Paternal Uncle Deceased     Paternal Cousin Alive, age 57y Cancer-Breast  (27 y)    Maternal Aunt Deceased Cancer-Ovarian  (34 - 19 y)    Maternal Aunt Deceased Cancer-Breast  (107 - 28 y)    Maternal Uncle Deceased Cancer-Prostate  (35 - 74 y)    Maternal Uncle Deceased           *nieces/nephews, paternal/maternal cousins were asked about and are A+W. Relatives who were not reported to have a history of cancer were not included in the above family history.    Ancestry: White  Jewish Ancestry: none reported  Any family members with genetic testing? No    The family history is based on the patient's recollection and taken in absence of additional medical records. If the family history changes or if more information is gathered, Janasha can contact me as this may alter their recommendations for testing.     Discussion    Results interpretation and management  We discussed Pearle's negative (normal) genetic testing results today.    As Lynnett's previous testing did not find a pathogenic variant (increased cancer-risk change) in any of the analyzed genes, at this time Fantazia does not carry any known mutations that put her at an increased risk to develop cancer. This means that at this time we do not have a genetic explanation for Jadeyn's family history of cancer.     Negative genetic testing results can be explained by  several possibilities:  Currently methodology for genetic testing identifies most gene mutations; however, a small percentage of mutations may be unidentified with current technology. This means that, even with a negative test result, a hereditary cancer predisposition cannot be fully ruled out for the patient. Additionally, without a known familial variant in the family, the patient is still at an elevated risk for cancer in comparison to her peers.   There could be genes that were not evaluated on this genetic test and there could be genes that have yet to be discovered that have an associated hereditary cancer predisposition/syndrome that there is no available testing for at this time.   The cancer in the family could be due to something beyond an inherited gene predisposition. This would mean that cancer is sporadic (due to chance).     Management Consideration(s)  As a reminder, the American Cancer Society recommends preventative cancer screening for all individuals, even if their (or a family member's) genetic testing was negative. These screenings include:   Screening for cervical cancer via a Pap test starting at age 86 (females).   Screening for breast cancer via yearly mammograms starting at age 49-50 (females).   Screening for prostate cancer via a PSA blood test and/or digital rectal exam starting at age 1 (males).  Screening for colorectal cancer via colonoscopy starting at age 78.  Exceptions: These screenings may be recommended to occur at an earlier or differing age based on a family history of cancer and should be discussed with your medical team. For example, it is still important that Shanen's children discuss breast screening with their medical team 10 years prior to  Trinda's diagnosis of breast cancer.    Familial Recommendations  There may still be a hereditary cancer syndrome in Litzy's family that she just did not inherit. We recommended having Chastity's brothers have genetic counseling and testing due to their personal and/or family history of cancer. If there is a pathogenic (cancer-causing) genetic mutation found in Dorlis's family members, that information would greatly reduce Ramesha's risk for related cancers due to her testing being negative. If family members would like to pursue genetic testing at [facility], they can make an appointment with our offices at [phone]. If family members live outside [place] or [place], they can find a Patent attorney through [email or web address] website or at [email or web address]      It is still possible that Joanell's children could have inherited a hereditary cancer syndrome from their father's family. If there is a paternal family history of cancer, we recommend they consider seeing a genetic counselor to review the family history and determine if genetic testing is appropriate.         If Luda's female relatives are worried about their risk for breast cancer, they may qualify for additional screening. If an individual's lifetime risk for breast cancer is 20% or higher, they are considered to be high risk to develop breast cancer. Recommendations for these women are to alternate breast MRIs and mammograms every 6 months. If they would like to discuss their breast cancer risk and screening, they can make an appointment with the [facility] at [phone] or to get more information they can visit: [email or web address]    These screenings may be recommended to occur at an earlier or differing age based on a family history of cancer and should be discussed with your medical team.    If Yulitza's female relatives are worried about their risk for breast cancer, they may  qualify for additional screening. If an individual's lifetime risk for breast cancer is 20% or higher, they are considered to be high risk to develop breast cancer. Recommendations for these women are to alternate breast MRIs and mammograms every 6 months. If they would like to discuss their breast cancer risk and screening, they can make an appointment with the [facility] at [phone] or to get more information they can visit: [email or web address]    Plan   Darsha was encouraged to contact us if there are any new cancer diagnoses in the family and/or in about 5 years to see if there may be additional genetic testing or improved testing technologies that have been developed that would be worthwhile for herself or for her family members.     Time: Direct patient care 20 minutes.  Indirect patient care: 60 minutes. Discussed hereditary cancer syndromes, autosomal dominant inheritance / other inheritance as relevant, pre-symptomatic surveillance, management and treatment. Patient's test results and appropriate family members to pursue genetic testing.     Janeya is encouraged to reach out the [facility] at the [facility] with any questions or concerns following today's visit at [phone] or by emailing [email or web address].     Lilly Cove, MS, GC (she/her)  Cancer Genetic Counselor  [facility]  [address] [place],  [name], [place], [address]    Email: [email or web address]  Phone: [phone]  Fax: [phone]   Appointments: [phone]

## 2022-11-09 NOTE — Progress Notes
Cancer Therapeutics Note  Verified consent signed and in chart.    Blood return positive via: Port (Single, Power Port, and Accessed)    BSA and dose double checked (agree with orders as written) with: yes see MAR    Labs/applicable tests checked: CBC and Comprehensive Metabolic Panel (CMP)    Treatment regimen: Drug/cycle/dayfluorouraciL (ADRUCIL) 4,420 mg in sodium chloride 0.9% (NS) 92 mL IV amb pump    oxaliplatin (ELOXATIN) 187.85 mg in dextrose 5% (D5W) 287.57 mL IVPB    Rate verified and armband double check with second RN: yes    Patient education offered and stated understanding. Denies questions at this time.    Pt here for C6D1 oxaliplatin, 5-FU.  PAC flushed with NS, good blood return.  Pt tolerated Oxaliplatin infusion well.   5-FU pump infusing per orders via CADD pump, RUN showing on pump screen.  Pt left ambulatory, no further complaints.

## 2022-11-11 ENCOUNTER — Encounter: Admit: 2022-11-11 | Discharge: 2022-11-11 | Payer: 59

## 2022-11-11 DIAGNOSIS — C186 Malignant neoplasm of descending colon: Secondary | ICD-10-CM

## 2022-11-11 DIAGNOSIS — C50411 Malignant neoplasm of upper-outer quadrant of right female breast: Secondary | ICD-10-CM

## 2022-11-11 MED ORDER — SODIUM CHLORIDE 0.9 % IV SOLP
500 mL | Freq: Once | INTRAVENOUS | 0 refills | Status: CP
Start: 2022-11-11 — End: ?
  Administered 2022-11-11: 20:00:00 500 mL via INTRAVENOUS

## 2022-11-11 NOTE — Patient Instructions
[facility]  Discharge Instructions        Discharge Instructions  Call immediately to report the following:   Uncontrolled nausea or vomiting, pain, or bleeding   Temperature of 100.4 F or greater or any sign/symptom of infection (warmth, redness, tenderness)   Painful mouth or difficulty swallowing   Diarrhea    Swelling of arms or legs   Rash     Post-Treatment Directions:   Use mouth rinses after meals and at bedtime.  Use a non-alcohol commercial brand rinse   or mild salt water/baking soda rinse.     Drink 8-10 glasses of fluids daily.   Try to exercise daily to decrease fatigue.        Medication Instructions  If there are any specific medication instructions they are written below     Phone Numbers  Cancer Center Phone # [phone]

## 2022-11-11 NOTE — Progress Notes
Patient arrived with 13.7 mL left in reservoir due to a battery issue on treatment day. Per Seward Meth PA the patient has had nausea issues and she prefers that we wait until there is 10 or less in the reservoir before we prime the remainder. Patient received 500 mL of normal saline but is currently without nausea/ vomiting and is able to drink fluids. She tolerated the fluids well and when appropriate, the final 10 mL of 5FU was primed through the tubing. Flushed port in Right chest with 20 ccs saline. Tolerated procedure without complaint. Port access discontinued and patient was discharged in stable condition. She ambulated independently from the clinic.

## 2022-11-16 ENCOUNTER — Ambulatory Visit: Admit: 2022-11-16 | Discharge: 2022-11-16 | Payer: 59

## 2022-11-16 ENCOUNTER — Encounter: Admit: 2022-11-16 | Discharge: 2022-11-16 | Payer: 59

## 2022-11-16 ENCOUNTER — Ambulatory Visit: Admit: 2022-11-16 | Discharge: 2022-11-17 | Payer: 59

## 2022-11-16 DIAGNOSIS — D239 Other benign neoplasm of skin, unspecified: Secondary | ICD-10-CM

## 2022-11-16 DIAGNOSIS — Z789 Other specified health status: Secondary | ICD-10-CM

## 2022-11-16 DIAGNOSIS — E039 Hypothyroidism, unspecified: Secondary | ICD-10-CM

## 2022-11-16 DIAGNOSIS — E88819 Insulin resistance: Secondary | ICD-10-CM

## 2022-11-16 DIAGNOSIS — C189 Malignant neoplasm of colon, unspecified: Secondary | ICD-10-CM

## 2022-11-16 DIAGNOSIS — R000 Tachycardia, unspecified: Secondary | ICD-10-CM

## 2022-11-16 DIAGNOSIS — F3289 Other specified depressive episodes: Secondary | ICD-10-CM

## 2022-11-16 DIAGNOSIS — E669 Obesity, unspecified: Secondary | ICD-10-CM

## 2022-11-16 DIAGNOSIS — R12 Heartburn: Secondary | ICD-10-CM

## 2022-11-16 DIAGNOSIS — D1801 Hemangioma of skin and subcutaneous tissue: Secondary | ICD-10-CM

## 2022-11-16 DIAGNOSIS — C50411 Malignant neoplasm of upper-outer quadrant of right female breast: Secondary | ICD-10-CM

## 2022-11-16 DIAGNOSIS — E785 Hyperlipidemia, unspecified: Secondary | ICD-10-CM

## 2022-11-16 DIAGNOSIS — C50919 Malignant neoplasm of unspecified site of unspecified female breast: Secondary | ICD-10-CM

## 2022-11-16 DIAGNOSIS — J302 Other seasonal allergic rhinitis: Secondary | ICD-10-CM

## 2022-11-16 DIAGNOSIS — F32A Depression: Secondary | ICD-10-CM

## 2022-11-16 DIAGNOSIS — E282 Polycystic ovarian syndrome: Secondary | ICD-10-CM

## 2022-11-16 DIAGNOSIS — I10 Essential (primary) hypertension: Secondary | ICD-10-CM

## 2022-11-16 DIAGNOSIS — D1722 Benign lipomatous neoplasm of skin and subcutaneous tissue of left arm: Secondary | ICD-10-CM

## 2022-11-16 DIAGNOSIS — T7840XA Allergy, unspecified, initial encounter: Secondary | ICD-10-CM

## 2022-11-16 DIAGNOSIS — B351 Tinea unguium: Secondary | ICD-10-CM

## 2022-11-16 DIAGNOSIS — D225 Melanocytic nevi of trunk: Secondary | ICD-10-CM

## 2022-11-16 DIAGNOSIS — L821 Other seborrheic keratosis: Secondary | ICD-10-CM

## 2022-11-16 DIAGNOSIS — Z923 Personal history of irradiation: Secondary | ICD-10-CM

## 2022-11-16 DIAGNOSIS — F419 Anxiety disorder, unspecified: Secondary | ICD-10-CM

## 2022-11-16 NOTE — Patient Instructions
Vitamin D  - We recommend Vitamin D supplementation after a fatty meal, especially if there is no contraindications such as kidney stones    Moles  --Common melanocytic nevi (moles) tend to be =6 mm in diameter and symmetric with even pigmentation, round or oval shape, regular outline, and sharp, non-fuzzy border.  --Dermal nevi stick out from the skin, but if they are soft they are usually not worrisome.  --Flaky seemingly stuck-on brown bumps are usually benign keratoses, not moles.  --Bright red smooth bumps that do not bleed are usually benign blood vessel lesions (cherry angiomas), not moles.  --Atypical nevi/clinical features of possible melanoma include asymmetry, border irregularities, color variability, and diameter >6 mm.  The earliest sign of melanoma is usually a rapidly growing mole.  --About half of melanoma arises in an existing mole and up to half on normal skin.  --It is normal to get new moles until the age of 7-45 years old.  --Multiple atypical nevi are a marker of increased risk of melanoma. The risk of melanoma depends also upon the total number of nevi, family and/or personal history of melanoma, and sun exposure history.   --It is important to look at your moles once a month.  It may help to follow them with photos such as on your smart phone.  Looking once a month you can notice rapid changes and call if these occur.  --Using sunscreens and sun avoidance will decrease your risk of developing melanoma.  The best sun protection is sun avoidance, including with clothing such as long sleeves and a broad brimmed hat.  The best sunscreens are SPF 30 or above cream based with zinc oxide.  One ounce (shot glass sized) amount is needed for an adult.  It should be reapplied every 2 hours if possible.  --Any tanning bed use will increase your risk of melanoma significantly.    Sun Protection   UPF/SPF rated clothing (gloves, long sleeves, scarves); broad-brimmed hats (NOT ball caps!)   [employer] additive can increase the SPF value of your everyday clothing   Cowboy hats protect from sun; baseball hats don't   Here are several recommended zinc-based sunscreen brands in aplphabetical order (always read the ingredient list, as many brands have multiple varieties of sunscreens and not all are zinc-based)   Cyndia Bent, Coca [employer]a, [employer], Culp, [place], [employer]dess [employer], Green Screen,  [employer], Forensic scientist, SkinCeuticals, Vanicream   2 shot-glases = whole body    Melanoma Patient Information    Also called malignant melanoma     Skin cancer screening: If you notice a mole that differs from others or one that changes, bleeds, or itches, see a dermatologist.   Melanoma is a type of skin cancer. Anyone can get melanoma. When found early and treated, the cure rate is nearly 100%. Allowed to grow, melanoma can spread to other parts of the body. Melanoma can spread quickly. When melanoma spreads, it can be deadly.Dermatologists believe that the number of deaths from melanoma would be much lower if people:  Knew the warning signs of melanoma.   Learned how to examine their skin for signs of skin cancer.   Took the time to examine their skin.   It's important to take time to look at the moles on your skin because this is a good way to find melanoma early. When checking your skin, you should look for the ABCDEs of melanoma.     ABCDE's of melanoma:  When performing monthly  skin exams for your moles or new moles, remember the ABCDE's of melanoma:    A - Asymmetry. (Concerning if spot is not symmetric)  B - Border. (Irregular border or notched border are concerning)  C - Color. (Multiple colors or changes in color are concerning.)  D - Diameter. (Larger than 6mm, ie, a pencil eraser, is concerning.)  E - Evolution. (An evolving or changing spot is concerning. If new itch, tenderness, or bleeding develop, these are concerning changes too.  See further explanation below:    Melanoma: Signs and symptoms Anyone can get melanoma. It's important to take time to look at the moles on your skin because this is a good way to find melanoma early. When checking your skin, you should look for the ABCDEs of melanoma.     ABCDEs of melanoma     A = Asymmetry  One half is unlike the other half.       B = Border  An irregular, scalloped, or poorly defined border.       C = Color  Is varied from one area to another; has shades of tan, brown or black, or is sometimes white, red, or blue.       D = Diameter  Melanomas usually greater than 6mm (the size of a pencil eraser) when diagnosed, but they can be smaller.       E = Evolving  A mole or skin lesion that looks different from the rest or is changing in size, shape, or color.    !! If you see a mole or new spot on your skin that has any of the ABCDEs, immediately make an appointment to see a dermatologist.    Signs of melanoma  The most common early signs (what you see) of melanoma are:     Growing mole on your skin.   Unusual looking mole on your skin or a mole that does not look like any other mole on your skin (the ugly duckling).   Non-uniform mole (has an odd shape, uneven or uncertain border, different colors).     Symptoms of melanoma  In the early stages, melanoma may not cause any symptoms (what you feel). But sometimes melanoma will:    Itch.    Bleed.    Feel painful.   Many melanomas have these signs and symptoms, but not all. There are different types of melanoma. One type can first appear as a brown or black streak underneath a fingernail or toenail. Melanoma also can look like a bruise that just won't heal.     Who gets melanoma?  Anyone can get melanoma. Most people who get it have light skin, but people who have brown and black skin also get melanoma.   Some people have a higher risk of getting melanoma. These people have the following traits:   Skin    Fair skin (The risk is higher if the person also has red or blond hair and blue or green eyes). Sun-sensitive skin (rarely tans or burns easily).    50-plus moles, large moles, or unusual-looking moles.    If you have had bad sunburns or spent time tanning (sun, tanning beds, or sun lamps), you also have a higher risk of getting melanoma.   Men older than 50 are at a higher risk for developing skin cancers, including melanoma. Learning how to check your skin and getting skin exams can help detect skin cancer.    Family/medical history   Melanoma runs  in the family (parent, child, sibling, cousin, aunt, uncle had melanoma).   You had another skin cancer, but most especially another melanoma.   A weakened immune system.      Research shows that indoor tanning increases a person's melanoma risk by 75%. The risk also may increase if you had breast or thyroid cancer.    More people getting melanoma  Fewer people are getting most types of cancer. Melanoma is different. More people are getting melanoma. Many are white men who are 50 years or older. More young people also are getting melanoma. Melanoma is now the most common cancer among people 19-66 years old. Even teenagers are getting melanoma.    What causes melanoma?  Ultraviolet (UV) radiation is a major contributor in most cases. We get UV radiation from the sun, tanning beds, and sun lamps. Heredity also plays a role. Research shows that if a close blood relative (parent, child, sibling, aunt, uncle) had melanoma, a person has a much greater risk of getting melanoma.     How do dermatologists diagnose melanoma?  To diagnose melanoma, a dermatologist begins by looking at the patient's skin. A dermatologist will carefully examine moles and other suspicious spots. To get a better look, a dermatologist may use a device called a dermoscope.      Vitamin D    Our bodies need vitamin D to build strong and healthy bones. Vitamin D helps the body absorb the calcium that our bones require.     For a healthy person, the recommended daily dietary allowance is 600 international units for people of 56-6 years old, and 800 international units for people older than 71 years. More vitamin D is not better. Higher amounts of vitamin D could be harmful, leading to many health problems such as high blood pressure and kidney damage.     [employer] Dermatology recommending everyone get vitamin D from foods naturally rich in vitamin D, foods and beverages fortified with vitamin D or vitamin D supplements. The foods that contain the greatest amount are fatty fishes such as salmon, tuna and mackerel. Fish liver oil is another good source.     One of the sources to look up vitamin amount is through [employer]. [employer]. This can help you find out whether you get enough vitamin D from your diet. If you are like many people, you may not be getting your recommended dietary allowance of vitamin D. You may want to change the foods that you eat or take a vitamin D supplements. Before you start taking a vitamin D supplement, talk with your doctor.     Vitamin D is produced in the skin by UV light, but the amount is highly variable and depends on many factors. However, getting vitamin D from the sun or tanning beds can 1) increase your risk of developing skin cancer including melanoma which can be deadly, 2) resulting premature skin aging (wrinkles, age spots, blotchy complexion) and 3) leading to a weakened immune system. Therefore, Teacher, music of Dermatology recommend getting vitamin D safely from foods, beverages and supplements.

## 2022-11-16 NOTE — Progress Notes
Date of Service: 11/16/2022    Subjective:             Jennifer Durham is a 47 y.o. female.    History of Present Illness  Return patient, LV 06/2022 with Dr. 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    - These have been present for many years.   - There is history of blistering sunburns. Remote history of tanning bed use 20 years ago, 2 x per week.   - None are itching, bleeding, or painful  - None are changing    # Onychomycosis of toenails  - Currently treating with Ciclopirox with improvement    Personal Hx: no history of skin cancer. history of breast cancer (ER+, PR+, HER2-) and recent diagnosis of colon cancer (currently undergoing chemo)  Family Hx: no history of melanoma skin cancer  Social Hx: Runner, broadcasting/film/video. Nonsmoker.        Review of Systems   Constitutional:  Negative for appetite change and unexpected weight change.   Gastrointestinal:  Negative for diarrhea, nausea and vomiting.       Objective:          carvediloL (COREG) 6.25 mg tablet Take one tablet by mouth twice daily with meals. Take with food.    CHOLEcalciferoL (vitamin D3) (OPTIMAL D3) 50,000 units capsule Take one capsule by mouth every 7 days. 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    escitalopram oxalate (LEXAPRO) 10 mg tablet TAKE ONE TABLET BY MOUTH EVERY DAY    ferrous sulfate (IRON) 325 mg (65 mg iron) tablet Take one tablet by mouth daily. Take on an empty stomach at least 1 hour before or 2 hours after food.    INTRAUTERINE DEVICE (IUD) IU by Intrauterine route.    levothyroxine (SYNTHROID) 50 mcg tablet Take one tablet by mouth daily.    lidocaine/prilocaine (EMLA) 2.5/2.5 % topical cream Apply pea sized amount of cream in a thick layer over port site 45 minutes prior to access, cover with transparent dressing    loperamide (IMODIUM A-D) 2 mg capsule TAKE ONE CAPSULE BY MOUTH THREE TIMES DAILY, 30-45 MINUTES prior to meals    LORazepam (ATIVAN) 0.5 mg tablet Take 1 tab by mouth every 6 hrs as needed N/V not controlled by Zofran or Compazine. May also use every 6 hrs as needed anxiety or at bedtime insomnia.    losartan (COZAAR) 25 mg tablet Take one-half tablet by mouth daily.    metFORMIN (GLUCOPHAGE) 1,000 mg tablet Take one tablet by mouth daily with breakfast.    montelukast (SINGULAIR) 10 mg tablet Take one tablet by mouth at bedtime daily.    OLANZapine (ZYPREXA) 5 mg tablet Take one tablet by mouth at bedtime daily. Indications: nausea and vomiting caused by cancer drugs    ondansetron HCL (ZOFRAN) 8 mg tablet Take one tablet by mouth every 8 hours as needed for Nausea or Vomiting. Avoid on Days 1-2 of each chemotherapy cycle.    prochlorperazine maleate (COMPAZINE) 10 mg tablet Take one tablet by mouth every 6 hours as needed for Nausea or Vomiting.  rosuvastatin (CRESTOR) 5 mg tablet Take one tablet by mouth daily.    tamoxifen (NOLVADEX) 20 mg tablet TAKE ONE TABLET BY MOUTH EVERY NIGHT AT BEDTIME    tirzepatide (MOUNJARO) 7.5 mg/0.5 mL injector PEN Inject 0.5 mL under the skin every 7 days. wednesday    traMADoL (ULTRAM) 50 mg tablet Take one tablet to two tablets by mouth every 6 hours as needed. Indications: pain     Vitals:    11/16/22 0920   Weight: 125.2 kg (276 lb)   Height: 182.9 cm (6')     Body mass index is 37.43 kg/m?Marland Kitchen     Physical Exam  General: Alert and oriented, no acute distress  Eyes: Conjunctiva clear, normal EOM    Areas Examined (all normal unless noted below):  Head/Face  Neck  Chest/axillae  Back  Abdomen  Buttocks  R upper ext  L upper ext  R lower ext  L lower ext  Breast and groin exam deferred   Unable to adequately examine toenails due to presence of nail polish/acrylic nails       Pertinent findings include:    Several brown and tan evenly pigmented macules are distributed over the examined areas. All have symmetric similar dermascopic findings with primarily globular and reticular patterns.    Soft, pigmented, stuck-on-appearing papules are distributed over the examined areas. All have symmetric pebbled dermoscopic findings.    Pink, violaceous papules with positive dimple sign and central starburst pattern with peripheral fine tan reticular network on dermoscopy on right posterior thigh and posterior calf    Bright red, pink macules and papules distributed over torso and extremities    Approx 4 cm soft non-tender nodule/mass on left lateral arm       Assessment and Plan:    # History of breast cancer  # History of colon cancer  # Melanocytic nevi  - Will cont to monitor  - RTC for new/changing lesions  - provided patient with information on ABCD's of melanoma and photoprotection (including OTC sunscreen SPF 30+), and vitamin D    # Onychomycosis, chronic, improving  - Discussed diagnosis and treatment options including observation, topical therapy and oral therapy  - Patient currently treating with Ciclopirox with improvement  - Continue Ciclopirox Apply thin layer to affected toenails once daily. Every seven days, wash off with alcohol swab and repeat process.    # Dermatofibroma  - reassurance of benign nature  - discussed excision if pt bothered by lesion    # Suspected Lipoma of left upper extremity   - discussed likely benign diagnosis but that the only way to confirm this is with removal  - offered monitoring versus excision  - Patient opted for monitoring  - If interested in removal, would refer to general surgery given large size    # Cherry Angiomas  - reassurance   - explained benign nature    # Seborrheic keratoses  - Discussed benign nature  - Reassurance      RTC in 12 months or sooner prn

## 2022-11-16 NOTE — Progress Notes
Radiation Oncology Follow Up Note  Date: 11/16/2022       Jennifer Durham is a 47 y.o. female.     The encounter diagnosis was Malignant neoplasm of upper-outer quadrant of right breast in female, estrogen receptor positive (HCC).  Staging:  Cancer Staging   Colon cancer [facility])  Staging form: Colon And Rectum, AJCC 8th Edition  - Clinical stage from 07/23/2022: Stage IIIB (cT3, cN2a, cM0) - Signed by Miguel Rota, MD on 08/11/2022    Malignant neoplasm of upper-outer quadrant of right breast in female, estrogen receptor positive (HCC)  Staging form: Breast, AJCC 8th Edition  - Clinical stage from 07/04/2019: Stage IA (cT1c, cN0(f), cM0, G2, ER+, PR+, HER2-) - Signed by Guy Begin, PA-C on 07/12/2019  - Pathologic stage from 08/21/2019: Stage IA (pT1c, pN1a, cM0, G2, ER+, PR+, HER2-) - Signed by Guy Begin, PA-C on 08/30/2019      History of Present Illness  Ms. Jennifer Durham is a 47 y.o. female with hx of stage IA ER/PR+, HER2- IDC of the right breast diagnosed in 06/2019 after detecting dimpling of her breast in 05/2019. She presented for mammogram which identified a spiculated mass in the upper right medial breast. She underwent ultrasound and biopsy that confirmed carcinoma at that time. She presented to [place] for further evaluation. Pathology review at [place] confirmed grade 2 IDC with a focal lobular growth pattern and lymphoid tissue with no evidence of carcinoma in the right axillary specimen. She was recommended to undergo upfront surgery. On 08/01/2019, she underwent bilateral skin sparing mastectomy and right sentinel lymph node biopsy, axilla lymph node dissection, and reconstruction.  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  A 5 mm focus of metastatic carcinoma was found in 1 of 4 sentinel lymph nodes.  0 of 27 additional axillary nodes were involved. pT1c N1a M0 (1 of 34 LN+) M0. No LVI, No EIC, No ENE. She was recommended to receive adjuvant chemotherapy with TC x4 cycles, which she completed 11/28/2019. She was recommended to receive adjuvant radiation therapy.      Diagnosis:   Stage 1a ER/PR+, HER2- IDC of the right breast s/p bilateral SSM, right ALND and reconstruction. Patient underwent adjuvant radiotherapy to her right reconstructed breast, chest wall and regional nodes to a dose of 4526 cGy in 16 fractions from 02/19/2020 - 03/11/2020. She tolerated treatment well and had no treatment interruptions.  2. Moderately differentiated adenocarcinoma of the colon, pT3N2 (LN 5/48) mod diff adenocarcinoma, Lymphatic and / or Vascular Invasion: Present; Perineural Invasion--Present. Extended left hemicolectomy on 07/23/2022. No evidence of metastatic disease. The patient's CEA level was 0.8, and she was found to be microsatellite stable.      11/16/2022  Subjective:  Jennifer Durham returns today for routine follow-up.  She was last seen by my nurse practitioner in June.  2 days after that appointment the patient underwent extended left hemicolectomy for new diagnosis of moderately differentiated adenocarcinoma of the colon as described above.  Although there were some high risk features, fortunately no indication for adjuvant radiation as the tumor was not fixed or adherent to other critical structures.  She is receiving FOLFOX chemotherapy under the direction of Dr. Wynelle Link.  With respect to her breasts, she states that the right breast is slightly smaller than the left, but they are otherwise both soft without palpable masses, firmness, or other abnormalities of concern.  She has  been tolerating her chemotherapy fairly well but has been having increased fatigue and some vomiting and diarrhea with the last 2 cycles she is also reported increased feeling of coldness and chills.       07/21/2022 Subjective:       Jennifer Durham returns today for routine follow-up. She is unaccompanied to today's visit. She is doing well overall. She notes her fatigue is improving. She denies pain or tenderness in her breast. She does have some discomfort with range of motion and feels some tightness in her axilla/lateral chest wall. She does not find that this limits her activities. She does do some stretches to help with this along with her lymphedema exercises. She has some shortness of air with exertion, which is chronic in nature and unchanged recently. She denies associated chest pain, cough or fevers. She has experienced a decrease in visual acuity over the past year but denies other neurologic symptoms, such as headaches or incoordination. She does occasionally experience some lightheadedness with rapid position changes. She has experienced tachycardia and saw cardiology for this; her HCTZ was stopped as it was thought her symptoms may be related to dehydration. She has had some improvement in symptoms since this was stopped. She has experienced itching of her left inframammary fold recently. This started around Thanksgiving and has persisted, though occurs intermittently. She has not changed any skin care products. She has some joint pain with tamoxifen but otherwise tolerate this well. 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     Subjective:       Jennifer Durham returns today for routine follow-up. She is unaccompanied to today's visit. Doing okay. Did have a skin lesion on the inferior aspect of her right reconstructed breast. 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 No other changes with her reconstructed breasts. No pain or tenderness of her chest wall. Range of motion is somewhat limited in her right shoulder and she has a bit of discomfort when raising her right arm. She does not find that this interferes with her activities. She experiences rare SOA that occurs randomly but is transient. No chest pain, cough or fevers. Patient denies neurologic symptoms such as headache, dizziness or vision changes. Unfortunately, she has recently been found to have colon cancer. Scans fortunately have not shown metastatic disease so she is hopeful that she will only require surgery and no adjuvant treatment. Surgery is scheduled on Friday.      Review of Systems   Constitutional:  Positive for fatigue. Negative for fever.   HENT:  Negative for sore throat and trouble swallowing.    Eyes:  Negative for visual disturbance.   Respiratory:  Negative for cough and shortness of breath.    Cardiovascular:  Negative for chest pain.   Musculoskeletal:  Negative for arthralgias and myalgias.   Skin:  Negative for color change.   Neurological:  Negative for light-headedness and headaches.   Hematological:  Negative for adenopathy.   Psychiatric/Behavioral:  The patient is not nervous/anxious.    All other systems reviewed and are negative.      Objective:          carvediloL (COREG) 6.25 mg tablet Take one tablet by mouth twice daily with meals. Take with food.    CHOLEcalciferoL (vitamin D3) (OPTIMAL D3) 50,000 units capsule Take one capsule by mouth every 7 days. Sundays    ciclopirox (PENLAC) 8 % topical solution apply topically to entire affected nail(s)  once daily for NAIL FOLD fungal disease    dexAMETHasone (DECADRON) 4 mg tablet Take 2 tablets by mouth in the morning with food on Days 2-3 of each chemotherapy cycle.    escitalopram oxalate (LEXAPRO) 10 mg tablet TAKE ONE TABLET BY MOUTH EVERY DAY    ferrous sulfate (IRON) 325 mg (65 mg iron) tablet Take one tablet by mouth daily. Take on an empty stomach at least 1 hour before or 2 hours after food.    INTRAUTERINE DEVICE (IUD) IU by Intrauterine route.    levothyroxine (SYNTHROID) 50 mcg tablet Take one tablet by mouth daily.    lidocaine/prilocaine (EMLA) 2.5/2.5 % topical cream Apply pea sized amount of cream in a thick layer over port site 45 minutes prior to access, cover with transparent dressing    loperamide (IMODIUM A-D) 2 mg capsule TAKE ONE CAPSULE BY MOUTH THREE TIMES DAILY, 30-45 MINUTES prior to meals    LORazepam (ATIVAN) 0.5 mg tablet Take 1 tab by mouth every 6 hrs as needed N/V not controlled by Zofran or Compazine. May also use every 6 hrs as needed anxiety or at bedtime insomnia.    losartan (COZAAR) 25 mg tablet Take one-half tablet by mouth daily.    metFORMIN (GLUCOPHAGE) 1,000 mg tablet Take one tablet by mouth daily with breakfast.    montelukast (SINGULAIR) 10 mg tablet Take one tablet by mouth at bedtime daily.    OLANZapine (ZYPREXA) 5 mg tablet Take one tablet by mouth at bedtime daily. Indications: nausea and vomiting caused by cancer drugs    ondansetron HCL (ZOFRAN) 8 mg tablet Take one tablet by mouth every 8 hours as needed for Nausea or Vomiting. Avoid on Days 1-2 of each chemotherapy cycle.    prochlorperazine maleate (COMPAZINE) 10 mg tablet Take one tablet by mouth every 6 hours as needed for Nausea or Vomiting.    rosuvastatin (CRESTOR) 5 mg tablet Take one tablet by mouth daily.    tamoxifen (NOLVADEX) 20 mg tablet TAKE ONE TABLET BY MOUTH EVERY NIGHT AT BEDTIME    tirzepatide (MOUNJARO) 7.5 mg/0.5 mL injector PEN Inject 0.5 mL under the skin every 7 days. wednesday    traMADoL (ULTRAM) 50 mg tablet Take one tablet to two tablets by mouth every 6 hours as needed. Indications: pain     Vitals:    11/16/22 1045   BP: 105/79   BP Source: Arm, Left Upper   Pulse: 92   Temp: 36.7 ?C (98 ?F)   Resp: 18   SpO2: 97%   TempSrc: Temporal   PainSc: Zero   Weight: 125.6 kg (276 lb 12.8 oz)     Body mass index is 37.54 kg/m?Marland Kitchen     Pain Score: Zero        Fatigue Scale: 0-None    KARNOFSKY PERFORMANCE SCORE:  100% Normal, no complaints     Physical Exam  Vitals reviewed.   Constitutional:       General: She is awake. She is not in acute distress.     Appearance: Normal appearance. She is not ill-appearing.   HENT:      Head: Normocephalic and atraumatic.      Right Ear: External ear normal.      Left Ear: External ear normal.   Eyes:      General: Lids are normal.      Extraocular Movements: Extraocular movements intact.      Conjunctiva/sclera: Conjunctivae normal.   Cardiovascular:      Rate and  Rhythm: Normal rate and regular rhythm.   Pulmonary:      Effort: Pulmonary effort is normal. No respiratory distress.      Breath sounds: Normal breath sounds.   Chest:      Comments: Bilateral breast status post reconstruction.  The right breast slightly smaller in size and slightly hyperpigmented relative to the last but both breasts are similar in density.  No appreciable fibrosis, discrete nodularity, or masses.  No unusual skin rashes or nodularity.  No palpable lymphadenopathy in the bilateral axilla.  Musculoskeletal:      Cervical back: Neck supple.   Skin:     General: Skin is warm and dry.      Coloration: Skin is not pale.   Neurological:      General: No focal deficit present.      Mental Status: She is alert and oriented to person, place, and time. Mental status is at baseline.      Cranial Nerves: No cranial nerve deficit.   Psychiatric:         Attention and Perception: Attention normal.         Mood and Affect: Mood and affect normal.         Speech: Speech normal.         Behavior: Behavior normal.         Cognition and Memory: Cognition and memory normal.            Laboratory:    Comprehensive Metabolic Profile    Lab Results   Component Value Date/Time    NA 138 11/09/2022 10:30 AM    K 4.2 11/09/2022 10:30 AM    CL 101 11/09/2022 10:30 AM    CO2 30 11/09/2022 10:30 AM    GAP 7 11/09/2022 10:30 AM    BUN 8 11/09/2022 10:30 AM    CR 1.10 (H) 11/09/2022 10:30 AM    GLU 114 (H) 11/09/2022 10:30 AM    Lab Results   Component Value Date/Time    CA 9.2 11/09/2022 10:30 AM    ALBUMIN 3.6 11/09/2022 10:30 AM    TOTPROT 6.9 11/09/2022 10:30 AM    ALKPHOS 39 11/09/2022 10:30 AM    AST 21 11/09/2022 10:30 AM    ALT 8 11/09/2022 10:30 AM    TOTBILI 0.6 11/09/2022 10:30 AM    GFR 65.2 01/05/2022 12:00 AM    GFRAA >60 11/28/2019 08:10 AM        CBC w diff    Lab Results   Component Value Date/Time    WBC 5.3 11/09/2022 10:30 AM    RBC 3.99 (L) 11/09/2022 10:30 AM    HGB 10.8 (L) 11/09/2022 10:30 AM    HCT 32.7 (L) 11/09/2022 10:30 AM    MCV 81.9 11/09/2022 10:30 AM    MCH 27.0 11/09/2022 10:30 AM    MCHC 32.9 11/09/2022 10:30 AM    RDW 17.9 (H) 11/09/2022 10:30 AM    PLTCT 184 11/09/2022 10:30 AM    MPV 7.0 11/09/2022 10:30 AM    Lab Results   Component Value Date/Time    NEUT 70 11/09/2022 10:30 AM    ANC 3.70 11/09/2022 10:30 AM    LYMA 20 (L) 11/09/2022 10:30 AM    ALC 1.00 11/09/2022 10:30 AM    MONA 7 11/09/2022 10:30 AM    AMC 0.40 11/09/2022 10:30 AM    EOSA 2 11/09/2022 10:30 AM    AEC 0.10 11/09/2022 10:30 AM    BASA 1 11/09/2022  10:30 AM    ABC 0.10 11/09/2022 10:30 AM          Imaging:   CT ABD/PELV W CONTRAST  Narrative: CT CHEST, ABDOMEN, AND PELVIS    Clinical Indication: Malignant neoplasm of colon (adenocarcinoma). Status post recent subtotal colectomy. New baseline images prior to neoadjuvant chemotherapy.    Technique: Multiple contiguous axial images were obtained through the chest, abdomen, and pelvis following the administration of IV contrast material. Portal venous phase of postcontrast imaging was obtained. Post processing coronal and sagittal reconstruction images were made from the axial images.      IV contrast: Yes   Bowel contrast:  None    Comparison: CT chest abdomen pelvis 07/03/2022    CHEST FINDINGS:    Lower Neck: Unremarkable    Axilla, Mediastinum, and Hila: No thoracic lymphadenopathy.     Heart and Great Vessels: Normal heart size. Right internal jugular chest port with tip terminating in right atrium. Thoracic aorta is normal in caliber.    Airway, Lungs, and Pleura: Central airways are patent. Redemonstrated right fissural nodular opacity (series 3, image 44) and right lower lobe 5 mm nodular opacity (series 3, image 64). Scattered calcified granulomas in left lung. Trace left pleural effusion. No pulmonary mass or consolidation.    Chest Wall and Osseous Structures: Prior bilateral mastectomy with flap reconstruction. Thoracic spondylosis. No destructive osseous lesion.    ABDOMEN AND PELVIS FINDINGS:    Liver and Biliary system: Hepatomegaly with diffuse steatosis. No hepatic mass. Major portal veins patent. Gallbladder is nondistended. No biliary ductal dilatation.    Spleen: Unremarkable.    Adrenal Glands and Kidneys: Unremarkable.    Pancreas and Retroperitoneum: Pancreas unremarkable. No retroperitoneal lymphadenopathy.    Aorta and Major Vessels: Abdominal aorta is normal caliber with mild atherosclerosis.     Bowel, Mesentery, and Peritoneal space: Interval subtotal colectomy with recto-cecal anastomosis. Normal caliber bowel. Development of diffuse ill-defined thickening through peritoneal fat in the right abdomen. Trace ascites. No free air.    Pelvis: Bladder unremarkable. Unremarkable uterus with IUD seated within the endometrial cavity. No pelvic lymphadenopathy.    Abdominal Wall and Osseous Structures: Postoperative thickening in lower anterior abdominal wall. Thoracolumbar spondylosis. No destructive osseous lesion.  Impression: CHEST:  1. No evidence of thoracic metastatic disease.  2. Trace left pleural effusion.    ABDOMEN AND PELVIS:  1. Interval subtotal colectomy for colon adenocarcinoma with no evidence of abdominopelvic metastatic disease.  2. Diffuse ill-defined thickening of the peritoneal fat in the right abdomen, most likely representing postoperative inflammation or scarring.  3. Minimal ascites.     Approved by Marga Melnick, MD on 08/18/2022 5:03 PM    By my electronic signature, I attest that I have personally reviewed the images for this examination and formulated the interpretations and opinions expressed in this report     Finalized by Rosilyn Mings, M.D. on 08/18/2022 5:08 PM. Dictated by Marga Melnick, MD on 08/18/2022 2:35 PM.  CT CHEST W CONTRAST  Narrative: CT CHEST, ABDOMEN, AND PELVIS    Clinical Indication: Malignant neoplasm of colon (adenocarcinoma). Status post recent subtotal colectomy. New baseline images prior to neoadjuvant chemotherapy.    Technique: Multiple contiguous axial images were obtained through the chest, abdomen, and pelvis following the administration of IV contrast material. Portal venous phase of postcontrast imaging was obtained. Post processing coronal and sagittal reconstruction images were made from the axial images.      IV contrast: Yes   Bowel contrast:  None    Comparison: CT chest abdomen pelvis 07/03/2022    CHEST FINDINGS:    Lower Neck: Unremarkable    Axilla, Mediastinum, and Hila: No thoracic lymphadenopathy.     Heart and Great Vessels: Normal heart size. Right internal jugular chest port with tip terminating in right atrium. Thoracic aorta is normal in caliber.    Airway, Lungs, and Pleura: Central airways are patent. Redemonstrated right fissural nodular opacity (series 3, image 44) and right lower lobe 5 mm nodular opacity (series 3, image 64). Scattered calcified granulomas in left lung. Trace left pleural effusion. No pulmonary mass or consolidation.    Chest Wall and Osseous Structures: Prior bilateral mastectomy with flap reconstruction. Thoracic spondylosis. No destructive osseous lesion.    ABDOMEN AND PELVIS FINDINGS:    Liver and Biliary system: Hepatomegaly with diffuse steatosis. No hepatic mass. Major portal veins patent. Gallbladder is nondistended. No biliary ductal dilatation.    Spleen: Unremarkable.    Adrenal Glands and Kidneys: Unremarkable.    Pancreas and Retroperitoneum: Pancreas unremarkable. No retroperitoneal lymphadenopathy.    Aorta and Major Vessels: Abdominal aorta is normal caliber with mild atherosclerosis.     Bowel, Mesentery, and Peritoneal space: Interval subtotal colectomy with recto-cecal anastomosis. Normal caliber bowel. Development of diffuse ill-defined thickening through peritoneal fat in the right abdomen. Trace ascites. No free air.    Pelvis: Bladder unremarkable. Unremarkable uterus with IUD seated within the endometrial cavity. No pelvic lymphadenopathy.    Abdominal Wall and Osseous Structures: Postoperative thickening in lower anterior abdominal wall. Thoracolumbar spondylosis. No destructive osseous lesion.  Impression: CHEST:  1. No evidence of thoracic metastatic disease.  2. Trace left pleural effusion.    ABDOMEN AND PELVIS:  1. Interval subtotal colectomy for colon adenocarcinoma with no evidence of abdominopelvic metastatic disease.  2. Diffuse ill-defined thickening of the peritoneal fat in the right abdomen, most likely representing postoperative inflammation or scarring.  3. Minimal ascites.     Approved by Marga Melnick, MD on 08/18/2022 5:03 PM    By my electronic signature, I attest that I have personally reviewed the images for this examination and formulated the interpretations and opinions expressed in this report     Finalized by Rosilyn Mings, M.D. on 08/18/2022 5:08 PM. Dictated by Marga Melnick, MD on 08/18/2022 2:35 PM.       Path:  PATHOLOGY REPORT   Date Value Ref Range Status   07/23/2022   Final    [facility]  [email or web address]    Department of Pathology and Laboratory Medicine  [address], [place], [place] [address]  Surgical Pathology Office:  [phone]  Fax:  [phone]  SURGICAL PATHOLOGY REPORT    NAME: Jennifer, Durham PATH #: [id number] MR #: [id number] SPECIMEN  CLASS: [facility] BILLING #: [id number] ALT ID #:  LOCATION: [id number] DATE OF  PROCEDURE: 07/23/2022 AGE:  46 SEX: F DATE RECEIVED: 07/23/2022 DOB:  Mar 27, 1975  TIME RECEIVED:  10:56 PHYSICIAN: Benetta Spar, MD DATE OF  REPORT: 08/04/2022 COPY TO:  DATE OF PRINTING: 08/04/2022         ########################################################################  Final Diagnosis:    A. 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      B. Colon, extended left hemi colectomy:    Adenocarcinoma moderately differentiated. See comment.  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 Additional proximal margin#1, excision:  Colon tissue with Tubular adenomas. See comment.   Three lymph nodes negative for metastatic carcinoma (0/3).    D. Additional proximal margins #2, excision:    Tubulovillous adenoma with focal high-grade dysplasia  Eleven lymph nodes negative for metastatic carcinoma (0/11).       Comment:  Pursuant to the Quality Assurance Program at the [facility] Pathology Department, selected slides from this case have been  concurrently reviewed by the following pathologist: Dr. Juanetta Beets   who  agrees with the final diagnosis.       CASE SUMMARY: (COLON AND RECTUM: Resection)   Standard(s): AJCC-UICC 8   CAP Version: Colon and Rectum Resection 4.3.0.0    SPECIMEN     Procedure   ___ Subtotal (extended) left abdominal colectomy     Macroscopic Evaluation of Mesorectum (required for rectal cancers)   ___ Not applicable     TUMOR     Tumor Site (select all that apply)   ___ Transverse colon/Splenic flexure     Histologic Type   ___ Adenocarcinoma     Histologic Grade   ___ G2, moderately differentiated     Tumor Size   ___ Greatest dimension in Centimeters (cm): 4.0 x 3.8 x 0.7 cm    Multiple Primary Sites (e.g., hepatic flexure and transverse colon)   ___ Not applicable (no additional primary site(s) present)     Tumor Extent   ___ Invades through muscularis propria into the pericolonic or perirectal  tissue     Sub-mucosal Invasion (required only for pT1 tumors)   ___ Not applicable (not a pT1 tumor)     Macroscopic Tumor Perforation   ___ Not identified     Lymphatic and / or Vascular Invasion(select all that apply)   ___ Present (not otherwise specified)    Perineural Invasion   ___ Present     Tumor Budding Score   ___ Intermediate (5-9)     Treatment Effect   ___ No known presurgical therapy     MARGINS     Margin Status for Invasive Carcinoma   ___ All margins negative for invasive carcinoma   +Closest Margin(s) to Invasive Carcinoma (select all that apply)   ___ Radial (circumferential)/Mesenteric   +Distance from Invasive Carcinoma to Closest Margin   Specify in Centimeters (cm)   ___ Exact distance in cm: 3.4 cm  Distance from Invasive Carcinoma to Radial (Circumferential) Margin  (required for rectal  tumors)   ___ Distance already reported as closest margin.  +Distance from Invasive Carcinoma to Distal Margin (recommended for rectal  tumors)   Specify in Centimeters (cm)   ___ Exact distance in cm: 58.0 cm  Margin(s) Involved by Invasive Carcinoma (select all that apply)   ___ Not applicable     Margin Status for Non-Invasive Tumor (select all that apply)   ___ All margins negative for high-grade dysplasia / intramucosal carcinoma  and low-grade dysplasia     REGIONAL LYMPH NODES     Regional Lymph Node Status   ___ Regional lymph nodes present   ___ Tumor present in regional lymph nodes   Number of Lymph Nodes with Tumor   ___ Exact number (specify): 5  Number of Lymph Nodes Examined   ___ Exact number (specify): 34    Tumor Deposits   ___ Not identified     DISTANT METASTASIS     Distant Site(s) Involved, if applicable (select all that apply)   ___  Not applicable     pTNM CLASSIFICATION (AJCC 8th Edition)   Reporting of pT, pN, and (when applicable) pM categories is based on  information available to the pathologist at the time the report is issued.  As per the AJCC (Chapter 1, 8th Ed.) it is the managing physician's  responsibility to establish the final pathologic stage based upon all  pertinent information, including but potentially not limited to this  pathology report.     Modified Classification (required only if applicable) (select all that  apply)   ___ Not applicable     pT Category   ___ pT3: Tumor invades through the muscularis propria into pericolorectal  tissues     T Suffix (required only if applicable)   ___ Not applicable     pN Category   pN2: Four or more regional nodes are positive   ___ pN2a: Four to six regional lymph nodes are positive     pM Category (required only if confirmed pathologically)   ___ Not applicable - pM cannot be determined from the submitted  specimen(s)     ADDITIONAL FINDINGS     +Additional Findings (select all that apply)   ___ Adenoma(s) (Tubulovillous adenoma with high grade dysplasia, 6.3 cm.  1.9 cm., rectosigmoid; tubular adenomas)    COMMENTS: Immunohistochemical stain for CD31 and D2-40 are performed on  block B43 and findings are reported above. Moc-31 performed on block B7  and findings are reported above.      Block for Biomarker Testing: B5, B10    Part C of the specimen, additional proximal margin #1 has tubular  adenomas. Tubular adenoma is focally present at open end of the one of the  portions of colon.       Attestation:  By this signature, I attest that I have personally formulated the final  interpretation expressed in this report and that the above diagnosis is  based upon my examination of the slides and/or other material indicated in  this report.    +++Electronically Signed Out By Margreta Journey, MD on 08/04/2022+++             ksw/07/27/2022             ########################################################################  Material Received:  A: rectal sigmoid mass  B: extended left hemicolectomy  C: additional proximal margin  D: additional proximal margins #2    History:  The patient has a history of malignant neoplasm of colon, unspecified part  of colon.        Gross Description:  A.  Received in formalin labeled with patient's name and rectal sigmoid  mass is a pink-tan friable aggregate of fungating soft polypoid tissue  fragments measuring 3.7 x 3.6 x 2.0 cm.  The resection margin is not  identified.  Specimen is serially sectioned and entirely submitted in  A1-A6. (rog)    B.  Fixative: Formalin  Labeled: Extended left hemicolectomy  Measurements: Transverse: (28.6 cm in length by 3.6-5.4 cm in diameter),  descending/sigmoid colon (43.5 cm in length by 4.6-7.9 cm in diameter).  Serosa: Tan, smooth with black tattoo ink in the transverse colon for an  area of 5.7 x 4.6 cm (11.0 cm from the proximal resection margin).  There  is a puckered area proximal to the tattoo ink measuring 2.1 x 0.8 cm (8.1  cm from the proximal resection margin).  Tumor: 4.0 x 3.8 x 0.7 cm  Tumor location: Mucosal surface of transverse colon  Tumor appearance: Pink-tan exophytic, ulcerative lesion  with heaped up  serpiginous borders  Tumor from proximal margin: 7.3 cm  Tumor from distal margin: 58.0 cm   Tumor invasion: Up to the serosa  Tumor from serosa: 0.1 cm  Tumor from circumferential/mesenteric margin: 3.4 cm  Gross perforation of colon by tumor (probe patent defect): No  Uninvolved mucosa: Pink-tan, smooth with minimal mucosal folds and no  diverticula or ulcers.  In the distal sigmoid colon there is a pink-tan  soft friable bosselated lesion measuring 6.3 x 4.7 x 3.2 cm (6.2 cm from  the distal margin, 63.3 cm from the tumor, 72.8 cm from the proximal  margin).  Depth of invasion of friable bosselated lesion: superficial with a depth  of invasion of less than 0.1 cm  Lymph nodes: ranging from 40 po...     Assessment and Plan:   Ms. Jennifer Durham is a 47 y.o. 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  pT1c pN1a.  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  She is s/p bilateral skin sparing mastectomy with temporary expander placement, right sentinel lymph node biopsy, axillary lymph node dissection and 1 of 34 lymph nodes positive for a 5 mm metastatic deposit, no LVI, no ENE, no EIC.  She underwent adjuvant chemotherapy with TC, completed 4 cycles in 11/2019. She enrolled in MA.39 ([facility]#[id number]) [facility], and was randomized to the treatment arm. She underwent adjuvant radiotherapy to her right reconstructed breast, chest wall and regional nodes to a dose of 4526 cGy in 16 fractions from 02/19/2020 - 03/11/2020. She is now s/p DIEP flap reconstruction of the bilateral breasts in 06/2020.     - Jennifer Durham was seen today for routine follow-up after completion of radiation therapy for her breast cancer.  At this time, she is clinically stable with no clinical evidence of disease recurrence or progression.  She has no subacute toxicities from having undergone adjuvant radiation, aside from mild limitation in right shoulder range of motion.   -She has been diagnosed with moderately differentiated adenocarcinoma on colonoscopy at the splenic flexure with another large rectosigmoid polyp (TVA). No metastatic disease on imaging. CEA WNL at 0.8. Planned for surgical resection on 07/23/2022.    - Our plan will be for the patient to return to our clinic in 6 months for continued follow-up.  She may contact us in the interim if there are any questions or concerns requiring earlier assessment.  -The patient will keep their appointments with their other managing providers including medical oncology, Dr. Welton Flakes, as well as with breast surgery. She continues on tamoxifen.            I am grateful for the opportunity to participate in the care of Jennifer Durham.    Please do not hesitate contact me if there are questions regarding today's visit.    Elby Showers, MD        Total time for today's visit was 20 minutes. Visit time spent on the following: preparing to see the patient, obtaining and/or reviewing separately obtained history/information, performing a physical examination and/or evaluation, counseling and educating the patient/family/caregiver, referring to and communication with other health care professionals, documenting clinical information in the electronic or other health record and care coordination.       Parts of this note were created using voice recognition software.  Please excuse any grammatical or typographical errors.

## 2022-11-16 NOTE — Progress Notes
ATTESTATION    I personally performed the key portions of the E/M visit, discussed case with resident and concur with resident documentation of history, physical exam, assessment, and treatment plan unless otherwise noted.    Staff name:  Shela Nevin Date:  11/16/2022

## 2022-11-17 ENCOUNTER — Encounter: Admit: 2022-11-17 | Discharge: 2022-11-17 | Payer: 59

## 2022-11-17 DIAGNOSIS — J302 Other seasonal allergic rhinitis: Secondary | ICD-10-CM

## 2022-11-17 DIAGNOSIS — E785 Hyperlipidemia, unspecified: Secondary | ICD-10-CM

## 2022-11-17 DIAGNOSIS — T7840XA Allergy, unspecified, initial encounter: Secondary | ICD-10-CM

## 2022-11-17 DIAGNOSIS — Z800 Family history of malignant neoplasm of digestive organs: Secondary | ICD-10-CM

## 2022-11-17 DIAGNOSIS — F419 Anxiety disorder, unspecified: Secondary | ICD-10-CM

## 2022-11-17 DIAGNOSIS — Z923 Personal history of irradiation: Secondary | ICD-10-CM

## 2022-11-17 DIAGNOSIS — C189 Malignant neoplasm of colon, unspecified: Secondary | ICD-10-CM

## 2022-11-17 DIAGNOSIS — E282 Polycystic ovarian syndrome: Secondary | ICD-10-CM

## 2022-11-17 DIAGNOSIS — Z8041 Family history of malignant neoplasm of ovary: Secondary | ICD-10-CM

## 2022-11-17 DIAGNOSIS — Z789 Other specified health status: Secondary | ICD-10-CM

## 2022-11-17 DIAGNOSIS — F3289 Other specified depressive episodes: Secondary | ICD-10-CM

## 2022-11-17 DIAGNOSIS — E039 Hypothyroidism, unspecified: Secondary | ICD-10-CM

## 2022-11-17 DIAGNOSIS — R12 Heartburn: Secondary | ICD-10-CM

## 2022-11-17 DIAGNOSIS — E669 Obesity, unspecified: Secondary | ICD-10-CM

## 2022-11-17 DIAGNOSIS — F32A Depression: Secondary | ICD-10-CM

## 2022-11-17 DIAGNOSIS — Z803 Family history of malignant neoplasm of breast: Secondary | ICD-10-CM

## 2022-11-17 DIAGNOSIS — C50411 Malignant neoplasm of upper-outer quadrant of right female breast: Secondary | ICD-10-CM

## 2022-11-17 DIAGNOSIS — R000 Tachycardia, unspecified: Secondary | ICD-10-CM

## 2022-11-17 DIAGNOSIS — E88819 Insulin resistance: Secondary | ICD-10-CM

## 2022-11-17 DIAGNOSIS — Z8042 Family history of malignant neoplasm of prostate: Secondary | ICD-10-CM

## 2022-11-17 DIAGNOSIS — C50919 Malignant neoplasm of unspecified site of unspecified female breast: Secondary | ICD-10-CM

## 2022-11-17 DIAGNOSIS — I10 Essential (primary) hypertension: Secondary | ICD-10-CM

## 2022-11-17 NOTE — Patient Instructions
Thank you for visiting me today. If you have questions or needs following our visit, please feel free to send my team a MyChart message or give my nurse a call. I value your needs and to ensure your needs are met while I am operating, I work with a team. My team consists of my nurse practitioner Deberah Pelton), and my nurse, Penni Homans). They assist with reviewing and answering messages Monday through Friday 8am to 4pm. If it is after 4pm, your message may be returned the following business day. Our office is closed on weekends and major holidays. If you have an urgent need after hours, please call [phone] and ask for the surgical oncology resident on call to be paged.      If testing was ordered today please know it may automatically be released to your mychart. You may review this before we do.     Please feel free call my nurse Yitzel Shasteen at [phone] with any questions.     Thank you for trusting our team with your care,      Dr. Deedra Ehrich MD  Deberah Pelton MSN, APRN, AGCNS-BC, AGPCNP-BC  Einar Pheasant BSN, RN  Surgical Oncology / Colorectal Surgery  [address] [place], [place], [place] [address]  Phone: [phone] / Fax: [phone]

## 2022-11-19 ENCOUNTER — Encounter: Admit: 2022-11-19 | Discharge: 2022-11-19 | Payer: 59

## 2022-11-22 NOTE — Progress Notes
This prechart is intended to be a reference for patient appointments. Information is gathered from in chart as well as external records. Information will be clarified/verified/updated in final documentation in the office visit.     Date 07/23/2022   Surgery Subtotal   Path/Stage pT3N2a   Med Onc Sun   Colonoscopy 06/23/2022 (incomplete)   Scans 08/18/2022   CEA 11/09/2022

## 2022-11-23 ENCOUNTER — Encounter: Admit: 2022-11-23 | Discharge: 2022-11-23 | Payer: 59

## 2022-11-23 ENCOUNTER — Encounter: Admit: 2022-11-23 | Discharge: 2022-11-23 | Payer: PRIVATE HEALTH INSURANCE

## 2022-11-23 DIAGNOSIS — C186 Malignant neoplasm of descending colon: Secondary | ICD-10-CM

## 2022-11-23 DIAGNOSIS — C189 Malignant neoplasm of colon, unspecified: Secondary | ICD-10-CM

## 2022-11-23 DIAGNOSIS — E611 Iron deficiency: Secondary | ICD-10-CM

## 2022-11-23 LAB — IRON + BINDING CAPACITY + %SAT+ FERRITIN
% SATURATION: 22 % — ABNORMAL LOW (ref 28–42)
FERRITIN: 48 ng/mL (ref 10–200)
IRON BINDING: 389 ug/dL — ABNORMAL HIGH (ref 270–380)
IRON: 87 ug/dL (ref 50–160)

## 2022-11-23 LAB — COMPREHENSIVE METABOLIC PANEL
ALBUMIN: 3.5 g/dL — ABNORMAL HIGH (ref 3.5–5.0)
ALK PHOSPHATASE: 39 U/L (ref 25–110)
ALT: 9 U/L — ABNORMAL LOW (ref 7–56)
ANION GAP: 9 % (ref 3–12)
AST: 17 U/L — ABNORMAL LOW (ref 7–40)
BLD UREA NITROGEN: 11 mg/dL — ABNORMAL LOW (ref <3.0)
CALCIUM: 9 mg/dL (ref 8.5–10.6)
CO2: 28 MMOL/L (ref 21–30)
CREATININE: 1.3 mg/dL — ABNORMAL HIGH (ref 0.4–1.00)
POTASSIUM: 4 MMOL/L (ref 3.5–5.1)
SODIUM: 141 MMOL/L (ref 137–147)
TOTAL BILIRUBIN: 0.4 mg/dL (ref 0.2–1.3)
TOTAL PROTEIN: 6.4 g/dL (ref 6.0–8.0)

## 2022-11-23 LAB — URINALYSIS DIPSTICK
GLUCOSE,UA: NEGATIVE
NITRITE: POSITIVE — AB
PROTEIN,UA: NEGATIVE
URINE BILE: NEGATIVE
URINE BLOOD: NEGATIVE
URINE KETONE: NEGATIVE
URINE PH: 6 (ref 5.0–8.0)
URINE SPEC GRAVITY: 1 (ref 1.005–1.030)

## 2022-11-23 LAB — CBC AND DIFF
ABSOLUTE BASO COUNT: 0 10*3/uL (ref 0–0.20)
RBC COUNT: 3.6 M/UL — ABNORMAL LOW (ref 4.0–5.0)
WBC COUNT: 4.5 10*3/uL (ref 4.5–11.0)

## 2022-11-23 MED ORDER — LORAZEPAM 0.5 MG PO TAB
.5 mg | ORAL | 0 refills | Status: AC | PRN
Start: 2022-11-23 — End: ?
  Administered 2022-11-23: 15:00:00 0.5 mg via ORAL

## 2022-11-23 MED ORDER — LORAZEPAM 0.5 MG PO TAB
.5 mg | ORAL | 0 refills | Status: DC | PRN
Start: 2022-11-23 — End: 2022-11-23

## 2022-11-23 MED ORDER — PALONOSETRON 0.25 MG/5 ML IV SOLN
.25 mg | Freq: Once | INTRAVENOUS | 0 refills | Status: CP
Start: 2022-11-23 — End: ?
  Administered 2022-11-23: 14:00:00 0.25 mg via INTRAVENOUS

## 2022-11-23 MED ORDER — OXALIPLATIN IVPB
85 mg/m2 | Freq: Once | INTRAVENOUS | 0 refills | Status: CP
Start: 2022-11-23 — End: ?
  Administered 2022-11-23 (×2): 187.85 mg via INTRAVENOUS

## 2022-11-23 MED ORDER — DEXAMETHASONE 4 MG PO TAB
4 mg | Freq: Once | ORAL | 0 refills | Status: CP
Start: 2022-11-23 — End: ?
  Administered 2022-11-23: 14:00:00 4 mg via ORAL

## 2022-11-23 MED ORDER — FLUOROURACIL IV AMB PUMP
2000 mg/m2 | Freq: Once | INTRAVENOUS | 0 refills | Status: CP
Start: 2022-11-23 — End: ?
  Administered 2022-11-23 (×3): 4420 mg via INTRAVENOUS

## 2022-11-23 NOTE — Progress Notes
Cancer Therapeutics Note  Verified consent signed and in chart.    Blood return positive via: Port (Single)    BSA and dose double checked (agree with orders as written) with: yes rn    Labs/applicable tests checked: CBC and Comprehensive Metabolic Panel (CMP)    Treatment regimen: Drug/cycle/day C7 D1    oxaliplatin (ELOXATIN) 187.85 mg in dextrose 5% (D5W) 287.57 mL IVPB   fluorouraciL (ADRUCIL) 4,420 mg in sodium chloride 0.9% (NS) 92 mL IV amb pump      Rate verified and armband double check with second RN: yes    Patient education offered and stated understanding. Denies questions at this time.    Patient arrived to CC treatment after being seen in clinic; please refer to clinic note for assessment details. Premeds given per treatment plan. Oxaliplatin given w/o complications, patient tolerated well. 20fu currently infusing via ambulatory pump. All connections secure and line unclamped. Patient left CC treatment in stable condition.

## 2022-11-23 NOTE — Progress Notes
Name: Jennifer Durham          MRN: [id number]      DOB: [birth year]      AGE: 47 y.o.   DATE OF SERVICE: 11/23/2022    Subjective:             Reason for Visit:  Follow Up      Jennifer Durham is a 47 y.o. female.      Cancer Staging   Colon cancer [facility])  Staging form: Colon And Rectum, AJCC 8th Edition  - Clinical stage from 07/23/2022: Stage IIIB (cT3, cN2a, cM0) - Signed by Miguel Rota, MD on 08/11/2022    Malignant neoplasm of upper-outer quadrant of right breast in female, estrogen receptor positive (HCC)  Staging form: Breast, AJCC 8th Edition  - Clinical stage from 07/04/2019: Stage IA (cT1c, cN0(f), cM0, G2, ER+, PR+, HER2-) - Signed by Guy Begin, PA-C on 07/12/2019  - Pathologic stage from 08/21/2019: Stage IA (pT1c, pN1a, cM0, G2, ER+, PR+, HER2-) - Signed by Guy Begin, PA-C on 08/30/2019      History of Present Illness  Jennifer Durham is a 47 year old female with a history of breast cancer S/P bilateral DIEP flaps, PCOS, obesity (BMI 41), and insulin resistance who underwent a screening colonoscopy 06/23/22 during which a partially obstructing MSS moderately differentiated splenic flexure adenocarcinoma was found which could not be traversed. 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 Previous genetic testing '21 was negative. She underwent a robotic converted to open subtotal colectomy with Deloyer's maneuver and splenic flexure mobilization on 07/23/2022. Final path stage 3, pT3N2a.    She is currently undergoing chemotherapy for colon cancer, presents with persistent gastrointestinal symptoms. She reports an 'upset stomach' and frequent bowel movements, initially up to ten times a day, but now reduced to approximately five times daily. The patient manages these symptoms with daily Imodium, the dose of which is adjusted based on dietary intake and daily activities. She also takes Metamucil gummies 2 twice a day and a probiotic, which she believes have been beneficial. The patient also reports a recent issue with her iron supplement prescription, which has not been refilled for the past two weeks. She was previously diagnosed as iron deficient at the onset of her cancer treatment. The patient is halfway through her chemotherapy treatment and is scheduled to complete the regimen by early January. She expresses concern about scheduling a follow-up colonoscopy, which is planned for early February. Recent imaging from July without evidence of disease. CEA from today is pending.     08/18/2022 - CT CAP:  CHEST:   1. No evidence of thoracic metastatic disease.   2. Trace left pleural effusion.     ABDOMEN AND PELVIS:   1. Interval subtotal colectomy for colon adenocarcinoma with no evidence   of abdominopelvic metastatic disease.   2. Diffuse ill-defined thickening of the peritoneal fat in the right   abdomen, most likely representing postoperative inflammation or scarring.   3. Minimal ascites.     07/23/2022 - 1) Robotic converted to open subtotal colectomy with Deloyer's maneuver 2) Splenic flexure mobilization 3) ICG injection for assessment of bowel perfusion 4) Flexible sigmoidoscopy **Modifier 22 due to complex intraoperative decision-making including advanced colon mobilization maneuvers.**  Pathology:  A. 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      B. Colon, extended left hemi colectomy:    Adenocarcinoma moderately differentiated. See comment.  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 Additional proximal margin#1, excision:   Colon tissue with Tubular adenomas. See comment.   Three lymph nodes negative for metastatic carcinoma (0/3).     D. Additional proximal margins #2, excision:    Tubulovillous adenoma with focal high-grade dysplasia   Eleven lymph nodes negative for metastatic carcinoma (0/11).       Comment:   Pursuant to the Quality Assurance Program at the [facility] Pathology Department, selected slides from this case have been   concurrently reviewed by the following pathologist: Dr. Juanetta Beets   who   agrees with the final diagnosis.       CASE SUMMARY: (COLON AND RECTUM: Resection)   Standard(s): AJCC-UICC 8   CAP Version: Colon and Rectum Resection 4.3.0.0     SPECIMEN     Procedure   ___ Subtotal (extended) left abdominal colectomy     Macroscopic Evaluation of Mesorectum (required for rectal cancers)   ___ Not applicable     TUMOR     Tumor Site (select all that apply)   ___ Transverse colon/Splenic flexure     Histologic Type   ___ Adenocarcinoma     Histologic Grade   ___ G2, moderately differentiated     Tumor Size   ___ Greatest dimension in Centimeters (cm): 4.0 x 3.8 x 0.7 cm     Multiple Primary Sites (e.g., hepatic flexure and transverse colon)   ___ Not applicable (no additional primary site(s) present)     Tumor Extent   ___ Invades through muscularis propria into the pericolonic or perirectal   tissue     Sub-mucosal Invasion (required only for pT1 tumors)   ___ Not applicable (not a pT1 tumor)     Macroscopic Tumor Perforation   ___ Not identified     Lymphatic and / or Vascular Invasion(select all that apply)   ___ Present (not otherwise specified)     Perineural Invasion   ___ Present     Tumor Budding Score   ___ Intermediate (5-9)     Treatment Effect   ___ No known presurgical therapy     MARGINS     Margin Status for Invasive Carcinoma   ___ All margins negative for invasive carcinoma   +Closest Margin(s) to Invasive Carcinoma (select all that apply)   ___ Radial (circumferential)/Mesenteric   +Distance from Invasive Carcinoma to Closest Margin   Specify in Centimeters (cm)   ___ Exact distance in cm: 3.4 cm   Distance from Invasive Carcinoma to Radial (Circumferential) Margin   (required for rectal   tumors)   ___ Distance already reported as closest margin.   +Distance from Invasive Carcinoma to Distal Margin (recommended for rectal   tumors)   Specify in Centimeters (cm)   ___ Exact distance in cm: 58.0 cm   Margin(s) Involved by Invasive Carcinoma (select all that apply)   ___ Not applicable     Margin Status for Non-Invasive Tumor (select all that apply)   ___ All margins negative for high-grade dysplasia / intramucosal carcinoma   and low-grade dysplasia     REGIONAL LYMPH NODES     Regional Lymph Node Status   ___ Regional lymph nodes present   ___ Tumor present in regional lymph nodes   Number of Lymph Nodes with Tumor   ___ Exact number (  specify): 5   Number of Lymph Nodes Examined   ___ Exact number (specify): 34     Tumor Deposits   ___ Not identified     DISTANT METASTASIS     Distant Site(s) Involved, if applicable (select all that apply)   ___ Not applicable     pTNM CLASSIFICATION (AJCC 8th Edition)   Reporting of pT, pN, and (when applicable) pM categories is based on   information available to the pathologist at the time the report is issued.   As per the AJCC (Chapter 1, 8th Ed.) it is the managing physician's   responsibility to establish the final pathologic stage based upon all   pertinent information, including but potentially not limited to this   pathology report.     Modified Classification (required only if applicable) (select all that   apply)   ___ Not applicable     pT Category   ___ pT3: Tumor invades through the muscularis propria into pericolorectal   tissues     T Suffix (required only if applicable)   ___ Not applicable     pN Category   pN2: Four or more regional nodes are positive   ___ pN2a: Four to six regional lymph nodes are positive     pM Category (required only if confirmed pathologically)   ___ Not applicable - pM cannot be determined from the submitted   specimen(s)     ADDITIONAL FINDINGS     +Additional Findings (select all that apply)   ___ Adenoma(s) (Tubulovillous adenoma with high grade dysplasia, 6.3 cm.   1.9 cm., rectosigmoid; tubular adenomas)     COMMENTS: Immunohistochemical stain for CD31 and D2-40 are performed on   block B43 and findings are reported above. Moc-31 performed on block B7   and findings are reported above.      Block for Biomarker Testing: B5, B10     Part C of the specimen, additional proximal margin #1 has tubular   adenomas. Tubular adenoma is focally present at open end of the one of the   portions of colon.       Past Medical History:   Diagnosis Date    Allergy     Anxiety     Anxiety disorder     Breast cancer (HCC) 07/04/2019    right    Cancer of colon (HCC) 06/23/22    Depression 11/25/2021    Depressive disorder, not elsewhere classified     Heartburn     Hx of radiation therapy 2022    Hyperlipemia 11/23    Hypertension     Hypothyroidism 09/2020    Insulin resistance     Limb alert care status     No venipuncture/BP's to RUE    Obesity     PCOS (polycystic ovarian syndrome)     Personal history of irradiation 02/2020    Seasonal allergies     Frequent sinusitis    Tachyarrhythmia      Surgical History:   Procedure Laterality Date    HX TONSILLECTOMY  2001    turbinates/adenoids 2015    HX TONSIL AND ADENOIDECTOMY  2011    LEFT PERONEAL TENDON REPAIR Left 12/15/2016    Performed by Tanja Port, MD at [facility] OR    Left Skin Sparing Mastectomy Left 08/21/2019    Performed by Renford Dills, MD at [facility] OR    IDENTIFICATION SENTINEL LYMPH NODE Right 08/21/2019    Performed by Renford Dills, MD at  [facility] OR    INJECTION RADIOACTIVE TRACER FOR SENTINEL NODE IDENTIFICATION Right 08/21/2019    Performed by Renford Dills, MD at [facility] OR    RADIOLOGICAL EXAM SURGICAL SPECIMEN Right 08/21/2019    Performed by Renford Dills, MD at [facility] OR    MASTECTOMY - MODIFIED RADICAL WITH AXILLARY LYMPH NODE DISSECTION Right 08/21/2019    Performed by Renford Dills, MD at [facility] OR    RECONSTRUCTION BREAST WITH TISSUE EXPANDER AND SUBSEQUENT EXPANSION - IMMEDIATE/ DELAYED Bilateral 08/21/2019    Performed by Marlinda Mike, MD at [facility] OR    IMPLANTATION BIOLOGIC IMPLANT FOR SOFT TISSUE REINFORCEMENT Bilateral 08/21/2019    Performed by Marlinda Mike, MD at [facility] OR    RECONSTRUCTION BREAST WITH DEEP INFERIOR EPIGASTRIC PERFORATOR FLAP/ SUPERFICIAL INFERIOR EPIGASTRIC ARTERY FLAP Bilateral 07/02/2020    Performed by Marlinda Mike, MD at [facility] OR    RECONSTRUCTION BREAST WITH FREE FLAP - request 22 modifier, complicated procedure due to obesity and intramuscular course Bilateral 07/02/2020    Performed by Marlinda Mike, MD at [facility] OR    INTRAVENOUS INJECTION AGENT TO TEST VASCULAR FLOW IN FLAP/ GRAFT Bilateral 07/02/2020    Performed by Marlinda Mike, MD at [facility] OR    APPLICATION NEGATIVE PRESSURE WOUND THERAPY Bilateral 07/02/2020    Performed by Marlinda Mike, MD at [facility] OR    IMPLANTATION MESH CLOSURE OF DEBRIDEMENT FOR NECROTIZING SOFT TISSUE INFECTION Bilateral 07/02/2020    Performed by Marlinda Mike, MD at [facility] OR    REMOVAL TISSUE EXPANDER Bilateral 07/02/2020    Performed by Marlinda Mike, MD at [facility] OR    INCISION AND DRAINAGE HEMATOMA/ SEROMA - TORSO N/A 08/05/2020    Performed by Marlinda Mike, MD at [facility] OR    REPAIR INTERMEDIATE WOUND 12.6 CM TO 20.0 CM - TRUNK - total length 24cm  08/05/2020    Performed by Marlinda Mike, MD at [facility] OR    GRAFTING AUTOLOGOUS FAT BY LIPOSUCTION TO TRUNK/ BREASTS/ SCALP/ ARMS/ LEGS - 50 CC OR LESS 60cc to the R, 90cc to the L Bilateral 08/10/2021    Performed by Marlinda Mike, MD at [facility] OR    GRAFTING AUTOLOGOUS FAT BY LIPOSUCTION TO TRUNK/ BREASTS/ SCALP/ ARMS/ LEGS - EACH ADDITIONAL 50 CC Bilateral 08/10/2021    Performed by Marlinda Mike, MD at [facility] OR    REVISION RECONSTRUCTED BREAST Left 08/10/2021    Performed by Marlinda Mike, MD at [facility] OR    REPAIR INTERMEDIATE WOUND  2.5 CM OR LESS - TORSO abdominal dogear removal R side - 9cm, L side - 10cm N/A 08/10/2021    Performed by Marlinda Mike, MD at [facility] OR    COLONOSCOPY DIAGNOSTIC WITH SPECIMEN COLLECTION BY BRUSHING/ WASHING - FLEXIBLE N/A 06/23/2022    Performed by Buckles, Vinnie Level, MD at [facility] OR    ROBOT ASSISTED LEFT COLECTOMY, CONVERTED TO OPEN N/A 07/23/2022    Performed by Benetta Spar, MD at [facility] OR    ROBOT ASSISTED SURGERY N/A 07/23/2022    Performed by Benetta Spar, MD at [facility] OR    BREAST SURGERY      CARDIOVASCULAR STRESS TEST      COSMETIC SURGERY      ECHOCARDIOGRAM PROCEDURE      ELECTROCARDIOGRAM       Family History   Problem Relation Name Age of Onset    Colon Polyps Mother Gigi Gin  [facility]     Heart Attack Mother Merri Brunette     Diabetes Mother Merri Brunette     [facility] Mother Merri Brunette 50    Heart Disease Mother Merri Brunette     Hypertension Mother Merri Brunette     High Cholesterol Mother Merri Brunette     Sudden Cardiac Death Mother Merri Brunette     Cancer Mother Merri Brunette     Heart Disease Father Rockwell [place]     Hypothyroid Father Rockwell [place]     Hypertension Father Rockwell [place]     Thyroid Disease Father Rockwell [place]     High Cholesterol Father Rockwell [place]     Hypertension Brother Royal Hawthorn     Thyroid Disease Brother Royal Hawthorn     [facility] Maternal Azzie Almas 60 - 45    Diabetes Maternal Aunt Wilma Podzimek     Arthritis-rheumatoid Maternal Aunt Wilma Podzimek     Cancer-Breast Maternal Aunt Wilma Podzimek     Cancer-Ovarian Maternal Aunt [place] Podzimek     Diabetes Maternal Amie Critchley     Heart Disease Maternal Bevelyn Ngo     Sudden Cardiac Death Maternal Bevelyn Ngo     Thyroid Disease Brother Magda Bernheim     Cancer-Breast Paternal Cousin  20    Cancer-Ovarian Maternal Aunt  50 - 59    Cancer-Breast Maternal Aunt Elnora 40 - 49    Cancer-Prostate Maternal Uncle Eldon 45 - 47    Cancer-Ovarian Maternal Uncle Eldon      Social History     Socioeconomic History    Marital status: Married   Tobacco Use    Smoking status: Never     Passive exposure: Never    Smokeless tobacco: Never   Vaping Use    Vaping status: Never Used   Substance and Sexual Activity Alcohol use: Yes     Alcohol/week: 2.0 standard drinks of alcohol     Types: 2 Drinks containing 0.5 oz of alcohol per week     Comment: socially    Drug use: Never    Sexual activity: Yes     Partners: Male     Birth control/protection: I.U.D.     Vaping/E-liquid Use    Vaping Use Never User                    Review of Systems   Constitutional: Negative.  Negative for chills, fever and unexpected weight change.   HENT: Negative.     Eyes: Negative.    Respiratory: Negative.     Cardiovascular: Negative.    Gastrointestinal: Negative.  Negative for abdominal distention, abdominal pain, anal bleeding, blood in stool, constipation, diarrhea, nausea, rectal pain and vomiting.   Endocrine: Negative.    Genitourinary: Negative.    Musculoskeletal: Negative.    Skin: Negative.  Negative for color change and wound.   Allergic/Immunologic: Negative.    Neurological: Negative.  Negative for weakness and light-headedness.   Hematological: Negative.    Psychiatric/Behavioral: Negative.           Objective:          carvediloL (COREG) 6.25 mg tablet Take one tablet by mouth twice daily with meals. Take with food.    CHOLEcalciferoL (vitamin D3) (OPTIMAL D3) 50,000 units capsule Take one capsule by mouth every 7 days. Sundays    ciclopirox (PENLAC) 8 % topical solution apply topically to entire affected nail(s) once daily for NAIL FOLD fungal  disease    dexAMETHasone (DECADRON) 4 mg tablet Take 2 tablets by mouth in the morning with food on Days 2-3 of each chemotherapy cycle.    escitalopram oxalate (LEXAPRO) 10 mg tablet TAKE ONE TABLET BY MOUTH EVERY DAY    ferrous sulfate (IRON) 325 mg (65 mg iron) tablet Take one tablet by mouth daily. Take on an empty stomach at least 1 hour before or 2 hours after food.    INTRAUTERINE DEVICE (IUD) IU by Intrauterine route.    levothyroxine (SYNTHROID) 50 mcg tablet Take one tablet by mouth daily.    lidocaine/prilocaine (EMLA) 2.5/2.5 % topical cream Apply pea sized amount of cream in a thick layer over port site 45 minutes prior to access, cover with transparent dressing    loperamide (IMODIUM A-D) 2 mg capsule TAKE ONE CAPSULE BY MOUTH THREE TIMES DAILY, 30-45 MINUTES prior to meals    LORazepam (ATIVAN) 0.5 mg tablet Take 1 tab by mouth every 6 hrs as needed N/V not controlled by Zofran or Compazine. May also use every 6 hrs as needed anxiety or at bedtime insomnia.    losartan (COZAAR) 25 mg tablet Take one-half tablet by mouth daily.    metFORMIN (GLUCOPHAGE) 1,000 mg tablet Take one tablet by mouth daily with breakfast.    montelukast (SINGULAIR) 10 mg tablet Take one tablet by mouth at bedtime daily.    OLANZapine (ZYPREXA) 5 mg tablet Take one tablet by mouth at bedtime daily. Indications: nausea and vomiting caused by cancer drugs    ondansetron HCL (ZOFRAN) 8 mg tablet Take one tablet by mouth every 8 hours as needed for Nausea or Vomiting. Avoid on Days 1-2 of each chemotherapy cycle.    prochlorperazine maleate (COMPAZINE) 10 mg tablet Take one tablet by mouth every 6 hours as needed for Nausea or Vomiting.    rosuvastatin (CRESTOR) 5 mg tablet Take one tablet by mouth daily.    tamoxifen (NOLVADEX) 20 mg tablet TAKE ONE TABLET BY MOUTH EVERY NIGHT AT BEDTIME    tirzepatide (MOUNJARO) 7.5 mg/0.5 mL injector PEN Inject 0.5 mL under the skin every 7 days. wednesday    traMADoL (ULTRAM) 50 mg tablet Take one tablet to two tablets by mouth every 6 hours as needed. Indications: pain     Vitals:    11/23/22 0747 11/23/22 0748   BP: 93/66    BP Source: Arm, Left Upper    Pulse: 103    Temp: 36.2 ?C (97.1 ?F)    Resp: 16    SpO2: 96%    TempSrc: Temporal    PainSc:  Zero   Weight: 125.5 kg (276 lb 9.6 oz)      Body mass index is 37.51 kg/m?Marland Kitchen     Pain Score: Zero       Fatigue Scale: 6       Physical Exam  Vitals reviewed.   Constitutional:       General: She is not in acute distress.     Appearance: Normal appearance. She is well-developed.   HENT:      Head: Normocephalic and atraumatic. Nose: Nose normal.   Eyes:      General: Lids are normal.      Conjunctiva/sclera: Conjunctivae normal.   Pulmonary:      Effort: Pulmonary effort is normal. No respiratory distress.   Musculoskeletal:         General: Normal range of motion.      Cervical back: Normal range of motion.   Neurological:  Mental Status: She is alert and oriented to person, place, and time.   Psychiatric:         Speech: Speech normal.         Behavior: Behavior normal.         Thought Content: Thought content normal.         Judgment: Judgment normal.               Assessment and Plan:  Date 07/23/2022   Surgery Subtotal   Path/Stage pT3N2a   Med Onc Sun   Colonoscopy 06/23/2022 (incomplete)   Scans 08/18/2022   CEA 11/09/2022     47yo female s/p subtotal colectomy for stage 3 colon cancer on 07/23/2022. Bowels in a better range ~5 a day on 1 imodium and fiber supplementation. Halfway through chemotherapy.     Subtotal Colectomy Bowel Changes:  - Increased frequency of bowel movements following colon resection. Currently managed with Imodium and Metamucil gummies.  - Increase Imodium to midday as needed for quality of life, up to 8 tablets per day.  - Continue Metamucil gummies as patient reports improvement with use.    IDA:  - Patient has been off iron supplementation for two weeks. Iron panel done today.     Colon Cancer Stage 3:  - Continue on chemotherapy with Dr. Wynelle Link.   - Discussed yes original colonoscopy was incomplete but she did have a subtotal colectomy so we aren't missing much. Will plan sooner than 65yr for flex sig. Will be completed once she is done with chemotherapy.   Per NCCN Guidelines:  - Surveillance colonoscopy at 3yr from surgery, repeat in 33yrs, and then every 8yrs unless advanced adenoma detected (villous polyp, polyp >1cm, or high grade dysplasia). If advanced adenoma detected repeat in 28yr. If preoperative colonoscopy was incomplete repeat in 3-12mths from surgery.  - H&P every 3-48mths for 53yrs then every for 93yrs.  - CEA every 3-8mths for 26yrs then every for 73yrs.   - CT CAP every 6-16mths for 52yrs.    Plan:  - RTC end of Jan or early Feb in combo with Dr. Wynelle Link after completion of treatment.     .The patient and family were allowed to ask questions and voice concerns; these were addressed to the best of our ability. They expressed understanding of what was explained to them, and they agreed with the present plan. Patient has the phone numbers for the Cancer Center and was instructed on how to contact us with any questions or concerns.    My collaborating provider on this patient is Dr. Deedra Ehrich MD.    Brunilda Payor. Fredricka Bonine MSN, APRN, AGCNS-BC, AGPCNP-BC  Advanced Practice Provider  Colon and Rectal Surgery  Surgical Oncology  APP for Dr. Kathee Delton, Dr. Daphine Deutscher and Dr. Daiva Nakayama  Pager: [phone]  Available via Amie Critchley and [facility] Connect

## 2022-11-23 NOTE — Progress Notes
Name: Jennifer Durham          MRN: [id number]      DOB: 07/22/1975      AGE: 47 y.o.   DATE OF SERVICE: 11/23/2022    Subjective:             Reason for Visit:  Follow Up      Damara Aguillon is a 47 y.o. female.      Cancer Staging   Colon cancer [facility])  Staging form: Colon And Rectum, AJCC 8th Edition  - Clinical stage from 07/23/2022: Stage IIIB (cT3, cN2a, cM0) - Signed by Miguel Rota, MD on 08/11/2022    Malignant neoplasm of upper-outer quadrant of right breast in female, estrogen receptor positive (HCC)  Staging form: Breast, AJCC 8th Edition  - Clinical stage from 07/04/2019: Stage IA (cT1c, cN0(f), cM0, G2, ER+, PR+, HER2-) - Signed by Guy Begin, PA-C on 07/12/2019  - Pathologic stage from 08/21/2019: Stage IA (pT1c, pN1a, cM0, G2, ER+, PR+, HER2-) - Signed by Guy Begin, PA-C on 08/30/2019      History of Present Illness    47 y.o. female with hx of stage IA ER/PR+, HER2- IDC of the right breast diagnosed in 06/2019 after detecting dimpling of her breast in 05/2019. She presented for mammogram which identified a spiculated mass in the upper right medial breast. She underwent ultrasound and biopsy that confirmed carcinoma . Pathology review at [place] confirmed grade 2 IDC with a focal lobular growth pattern and lymphoid tissue with no evidence of carcinoma in the right axillary specimen. She was recommended to undergo upfront surgery. On 08/01/2019, she underwent bilateral skin sparing mastectomy and right sentinel lymph node biopsy, axilla lymph node dissection, and reconstruction.  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  A 5 mm focus of metastatic carcinoma was found in 1 of 4 sentinel lymph nodes.  0 of 27 additional axillary nodes were involved. pT1c N1a M0 (1 of 34 LN+) M0. No LVI, No EIC, No ENE. She was recommended to receive adjuvant chemotherapy with TC x4 cycles,  underwent adjuvant radiotherapy to her right reconstructed breast, chest wall and regional nodes to a dose of 4526 cGy in 16 fractions from 02/19/2020 - 03/11/2020.      She is on tamoxifen and zoladex.     history of hypertension, polycystic ovarian disease, and prediabetesa colonoscopy     She had a colonoscopy performed on May 29th, 2024. The colonoscopy revealed a moderately differentiated adenocarcinoma suspected to be at the splenic flexure, 60 cm from the anal verge, and a large rectosigmoid polyp described as pedunculated. The polyp was biopsied but not removed and was identified as a tubulovillous adenoma. CT of the chest, abdomen, and pelvis, which showed no evidence of metastatic disease. The patient's CEA level was 0.8, and she was found to be microsatellite stable.     07/23/2022  Extended left hemi colectomy: pT3N2 (LN 5/48) mod diff adenocarcinoma, Lymphatic and / or Vascular Invasion: Present; Perineural Invasion--Present        Here for consultation about adjuvant chemotherapy. Overall recovering from surgery reasonable well, only complaint is mild intermittent lightheadedness, no F/C/N/V/D, no pain, no SOB, no DOE.     Accompanied by her husband.        Interval History,  11/23/2022  Continues to have adjuvant chemo  Cont follow up with new labs, clinically doing well, tolerate  the treatment well,  no F/C/N/V/D, noticed some urgent and discomfort in urination.     Review of Systems 12 system reviewed negative except above stated    Mother had polyps  2 siblings: age 29, 52; 2 biologic children: 52, and 49 yrs old     Objective:          carvediloL (COREG) 6.25 mg tablet Take one tablet by mouth twice daily with meals. Take with food.    CHOLEcalciferoL (vitamin D3) (OPTIMAL D3) 50,000 units capsule Take one capsule by mouth every 7 days. 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    escitalopram oxalate (LEXAPRO) 10 mg tablet TAKE ONE TABLET BY MOUTH EVERY DAY    ferrous sulfate (IRON) 325 mg (65 mg iron) tablet Take one tablet by mouth daily. Take on an empty stomach at least 1 hour before or 2 hours after food.    INTRAUTERINE DEVICE (IUD) IU by Intrauterine route.    levothyroxine (SYNTHROID) 50 mcg tablet Take one tablet by mouth daily.    lidocaine/prilocaine (EMLA) 2.5/2.5 % topical cream Apply pea sized amount of cream in a thick layer over port site 45 minutes prior to access, cover with transparent dressing    loperamide (IMODIUM A-D) 2 mg capsule TAKE ONE CAPSULE BY MOUTH THREE TIMES DAILY, 30-45 MINUTES prior to meals    LORazepam (ATIVAN) 0.5 mg tablet Take 1 tab by mouth every 6 hrs as needed N/V not controlled by Zofran or Compazine. May also use every 6 hrs as needed anxiety or at bedtime insomnia.    losartan (COZAAR) 25 mg tablet Take one-half tablet by mouth daily.    metFORMIN (GLUCOPHAGE) 1,000 mg tablet Take one tablet by mouth daily with breakfast.    montelukast (SINGULAIR) 10 mg tablet Take one tablet by mouth at bedtime daily.    OLANZapine (ZYPREXA) 5 mg tablet Take one tablet by mouth at bedtime daily. Indications: nausea and vomiting caused by cancer drugs    ondansetron HCL (ZOFRAN) 8 mg tablet Take one tablet by mouth every 8 hours as needed for Nausea or Vomiting. Avoid on Days 1-2 of each chemotherapy cycle.    prochlorperazine maleate (COMPAZINE) 10 mg tablet Take one tablet by mouth every 6 hours as needed for Nausea or Vomiting.    rosuvastatin (CRESTOR) 5 mg tablet Take one tablet by mouth daily.    tamoxifen (NOLVADEX) 20 mg tablet TAKE ONE TABLET BY MOUTH EVERY NIGHT AT BEDTIME    tirzepatide (MOUNJARO) 7.5 mg/0.5 mL injector PEN Inject 0.5 mL under the skin every 7 days. wednesday    traMADoL (ULTRAM) 50 mg tablet Take one tablet to two tablets by mouth every 6 hours as needed. Indications: pain     Vitals:    11/23/22 0808   BP: 93/66   BP Source: Arm, Left Upper   Pulse: 103   Temp: 36.2 ?C (97.1 ?F)   Resp: 16   SpO2: 96%   TempSrc: Temporal   Weight: 125.5 kg (276 lb 9.6 oz)     Body mass index is 37.51 kg/m?Marland Kitchen                  Pain Addressed:  N/A    Patient Evaluated for a Clinical Trial:  Patient currently in screening for a treatment clinical trial.     [place] Cooperative Oncology Group performance status is 0, Fully active, able to carry on all pre-disease performance without restriction.Marland Kitchen     Physical Exam  Constitutional:       Appearance: Normal appearance.   HENT:      Mouth/Throat:      Mouth: Mucous membranes are moist.   Eyes:      Extraocular Movements: Extraocular movements intact.      Conjunctiva/sclera: Conjunctivae normal.      Pupils: Pupils are equal, round, and reactive to light.   Cardiovascular:      Rate and Rhythm: Normal rate and regular rhythm.      Pulses: Normal pulses.      Heart sounds: Normal heart sounds.   Pulmonary:      Effort: Pulmonary effort is normal.      Breath sounds: Normal breath sounds.   Abdominal:      General: Abdomen is flat. There is no distension.      Palpations: Abdomen is soft.      Tenderness: There is no abdominal tenderness.   Musculoskeletal:      Cervical back: Normal range of motion and neck supple.   Skin:     General: Skin is warm.   Neurological:      General: No focal deficit present.      Mental Status: She is alert and oriented to person, place, and time.   Psychiatric:         Mood and Affect: Mood normal.         Behavior: Behavior normal.          07/23/2022  Pathology    Final Diagnosis:     A. 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      B. Colon, extended left hemi colectomy:    Adenocarcinoma moderately differentiated. See comment.   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 Additional proximal margin#1, excision:   Colon tissue with Tubular adenomas. See comment.   Three lymph nodes negative for metastatic carcinoma (0/3).     D. Additional proximal margins #2, excision:    Tubulovillous adenoma with focal high-grade dysplasia   Eleven lymph nodes negative for metastatic carcinoma (0/11).       Comment:   Pursuant to the Quality Assurance Program at the [facility] Department, selected slides from this case have been   concurrently reviewed by the following pathologist: Dr. Juanetta Beets   who   agrees with the final diagnosis.       CASE SUMMARY: (COLON AND RECTUM: Resection)   Standard(s): AJCC-UICC 8   CAP Version: Colon and Rectum Resection 4.3.0.0     SPECIMEN     Procedure   ___ Subtotal (extended) left abdominal colectomy     Macroscopic Evaluation of Mesorectum (required for rectal cancers)   ___ Not applicable     TUMOR     Tumor Site (select all that apply)   ___ Transverse colon/Splenic flexure     Histologic Type   ___ Adenocarcinoma     Histologic Grade   ___ G2, moderately differentiated     Tumor Size   ___ Greatest dimension in Centimeters (cm): 4.0 x 3.8 x 0.7 cm  Multiple Primary Sites (e.g., hepatic flexure and transverse colon)   ___ Not applicable (no additional primary site(s) present)     Tumor Extent   ___ Invades through muscularis propria into the pericolonic or perirectal   tissue     Sub-mucosal Invasion (required only for pT1 tumors)   ___ Not applicable (not a pT1 tumor)     Macroscopic Tumor Perforation   ___ Not identified     Lymphatic and / or Vascular Invasion(select all that apply)   ___ Present (not otherwise specified)     Perineural Invasion   ___ Present     Tumor Budding Score   ___ Intermediate (5-9)     Treatment Effect   ___ No known presurgical therapy     MARGINS     Margin Status for Invasive Carcinoma   ___ All margins negative for invasive carcinoma   +Closest Margin(s) to Invasive Carcinoma (select all that apply)   ___ Radial (circumferential)/Mesenteric   +Distance from Invasive Carcinoma to Closest Margin   Specify in Centimeters (cm)   ___ Exact distance in cm: 3.4 cm   Distance from Invasive Carcinoma to Radial (Circumferential) Margin   (required for rectal   tumors)   ___ Distance already reported as closest margin.   +Distance from Invasive Carcinoma to Distal Margin (recommended for rectal   tumors)   Specify in Centimeters (cm)   ___ Exact distance in cm: 58.0 cm   Margin(s) Involved by Invasive Carcinoma (select all that apply)   ___ Not applicable     Margin Status for Non-Invasive Tumor (select all that apply)   ___ All margins negative for high-grade dysplasia / intramucosal carcinoma   and low-grade dysplasia     REGIONAL LYMPH NODES     Regional Lymph Node Status   ___ Regional lymph nodes present   ___ Tumor present in regional lymph nodes   Number of Lymph Nodes with Tumor   ___ Exact number (specify): 5   Number of Lymph Nodes Examined   ___ Exact number (specify): 34     Tumor Deposits   ___ Not identified     DISTANT METASTASIS     Distant Site(s) Involved, if applicable (select all that apply)   ___ Not applicable     pTNM CLASSIFICATION (AJCC 8th Edition)   Reporting of pT, pN, and (when applicable) pM categories is based on   information available to the pathologist at the time the report is issued.   As per the AJCC (Chapter 1, 8th Ed.) it is the managing physician's   responsibility to establish the final pathologic stage based upon all   pertinent information, including but potentially not limited to this   pathology report.     Modified Classification (required only if applicable) (select all that   apply)   ___ Not applicable     pT Category   ___ pT3: Tumor invades through the muscularis propria into pericolorectal   tissues     T Suffix (required only if applicable)   ___ Not applicable     pN Category   pN2: Four or more regional nodes are positive   ___ pN2a: Four to six regional lymph nodes are positive     pM Category (required only if confirmed pathologically)   ___ Not applicable - pM cannot be determined from the submitted   specimen(s)     ADDITIONAL FINDINGS     +Additional Findings (select all that apply)   ___ Adenoma(s) (Tubulovillous  adenoma with high grade dysplasia, 6.3 cm.   1.9 cm., rectosigmoid; tubular adenomas)      Latest Reference Range & Units 11/23/22 07:13   Hemoglobin 12.0 - 15.0 GM/DL 16.1 (L)   Hematocrit 36 - 45 % 30.6 (L)   Platelet Count 150 - 400 K/UL 184   White Blood Cells 4.5 - 11.0 K/UL 4.5   Neutrophils 41 - 77 % 65   Absolute Neutrophil Count 1.8 - 7.0 K/UL 2.90   Lymphocytes 24 - 44 % 22 (L)   Absolute Lymph Count 1.0 - 4.8 K/UL 1.00   Monocytes 4 - 12 % 9   Absolute Monocyte Count 0 - 0.80 K/UL 0.40   Eosinophils 0 - 5 % 3   Absolute Eosinophil Count 0 - 0.45 K/UL 0.10   Absolute Basophil Count 0 - 0.20 K/UL 0.00   Basophils 0 - 2 % 1   RBC 4.0 - 5.0 M/UL 3.68 (L)   MCV 80 - 100 FL 83.2   MCH 26 - 34 PG 28.0   MCHC 32.0 - 36.0 G/DL 09.6   MPV 7 - 11 FL 6.4 (L)   RDW 11 - 15 % 18.7 (H)   Sodium 137 - 147 MMOL/L 141   Potassium 3.5 - 5.1 MMOL/L 4.0   Chloride 98 - 110 MMOL/L 104   CO2 21 - 30 MMOL/L 28   Anion Gap 3 - 12  9   Blood Urea Nitrogen 7 - 25 MG/DL 11   Creatinine 0.4 - 0.45 MG/DL 4.09 (H)   eGFR >81 mL/min 50 (L)   Glucose 70 - 100 MG/DL 191 (H)   Albumin 3.5 - 5.0 G/DL 3.5   Calcium 8.5 - 47.8 MG/DL 9.0   Total Bilirubin 0.2 - 1.3 MG/DL 0.4   Total Protein 6.0 - 8.0 G/DL 6.4   (L): Data is abnormally low  (H): Data is abnormally high      Assessment and Plan:    47 yr old female with history of right breast cancer (07/2019): ER/PR+, HER2- IDC pT1c N1a M0 (1 of 34 LN+) M0.  on  tamoxifen and zoladex. Who has newly diagnosed left colon adenocarcinoma s/P surgery: pT3N2 (LN 5/48) mod diff , positive for  Lymphatic and / or Vascular Invasion, and Perineural Invasion.   Here for consultation about adjuvant chemotherapy. Overall recovering from surgery reasonable well, only complaint is mild intermittent lightheadedness, no F/C/N/V/D, no pain, no SOB, no DOE.   Accompanied by her husband.     PS ECOG 0    Reviewed images and pathology with the pt and husband about her stage of disease, the risk of MRD/recurrence of disease, the rationale/mechanism of adjuvant treatment, risk vs benefit, regimen of FOLFOX (a schedule, timing, length), and potential side effects. (Info given and consent done)   Also discussed the surveillance   We discussed the study of ctDNA (C-14), the pt is interested in    Continues to have adjuvant chemo  Cont follow up with new labs, clinically doing well, tolerate the treatment well,  no F/C/N/V/D, noticed some urgent and discomfort in urination.     Lab reviewed- mild increasing Cr.     -- cont  mFOLFOX (cycle 7)  --encourging PO hydration  - urine dip for assess possible UTI  -- continue follow breast cancer care team  -- CT (3 months from the baseline)  -- follow with surgery team  -- call us for issues      The patient  had a chance to ask questions, all been answered to her statisfaction.       Miguel Rota, MD, [facility]

## 2022-11-24 ENCOUNTER — Encounter: Admit: 2022-11-24 | Discharge: 2022-11-24 | Payer: PRIVATE HEALTH INSURANCE

## 2022-11-25 ENCOUNTER — Encounter: Admit: 2022-11-25 | Discharge: 2022-11-25 | Payer: PRIVATE HEALTH INSURANCE

## 2022-11-25 ENCOUNTER — Encounter: Admit: 2022-11-25 | Discharge: 2022-11-25 | Payer: 59

## 2022-11-25 DIAGNOSIS — C186 Malignant neoplasm of descending colon: Secondary | ICD-10-CM

## 2022-11-25 NOTE — Progress Notes
Notified patient via mychart after discussion with Onc no need to continue oral iron. Refill request denied.     Brunilda Payor. Fredricka Bonine MSN, APRN, AGCNS-BC, AGPCNP-BC  Advanced Practice Provider  Colon and Rectal Surgery  Surgical Oncology  APP for Dr. Kathee Delton, Dr. Daphine Deutscher and Dr. Daiva Nakayama  Pager: [phone]  Available via Amie Critchley and [employer]

## 2022-11-25 NOTE — Progress Notes
Patient arrived with 0.0 mL left in reservoir, primed through. Flushed port in right chest with 20 ccs saline. Tolerated procedure without complaint. Port access discontinued and patient was discharged in stable condition. They ambulated independently from the clinic.

## 2022-11-25 NOTE — Patient Instructions
[facility]  Discharge Instructions        Discharge Instructions  Call immediately to report the following:   Uncontrolled nausea or vomiting, pain, or bleeding   Temperature of 100.4 F or greater or any sign/symptom of infection (warmth, redness, tenderness)   Painful mouth or difficulty swallowing   Diarrhea    Swelling of arms or legs   Rash     Post-Treatment Directions:   Use mouth rinses after meals and at bedtime.  Use a non-alcohol commercial brand rinse   or mild salt water/baking soda rinse.     Drink 8-10 glasses of fluids daily.   Try to exercise daily to decrease fatigue.        Medication Instructions  If there are any specific medication instructions they are written below     Phone Numbers  Cancer Center Phone # [phone]

## 2022-11-29 ENCOUNTER — Encounter: Admit: 2022-11-29 | Discharge: 2022-11-29 | Payer: 59

## 2022-11-29 MED ORDER — CIPROFLOXACIN HCL 500 MG PO TAB
500 mg | ORAL_TABLET | Freq: Two times a day (BID) | ORAL | 0 refills | 10.00000 days | Status: AC
Start: 2022-11-29 — End: ?

## 2022-12-03 ENCOUNTER — Encounter: Admit: 2022-12-03 | Discharge: 2022-12-03 | Payer: PRIVATE HEALTH INSURANCE

## 2022-12-06 ENCOUNTER — Ambulatory Visit: Admit: 2022-12-06 | Discharge: 2022-12-07 | Payer: PRIVATE HEALTH INSURANCE

## 2022-12-06 ENCOUNTER — Encounter: Admit: 2022-12-06 | Discharge: 2022-12-06 | Payer: 59

## 2022-12-06 ENCOUNTER — Encounter: Admit: 2022-12-06 | Discharge: 2022-12-06 | Payer: PRIVATE HEALTH INSURANCE

## 2022-12-06 DIAGNOSIS — C50411 Malignant neoplasm of upper-outer quadrant of right female breast: Secondary | ICD-10-CM

## 2022-12-07 ENCOUNTER — Encounter: Admit: 2022-12-07 | Discharge: 2022-12-07 | Payer: PRIVATE HEALTH INSURANCE

## 2022-12-07 ENCOUNTER — Encounter: Admit: 2022-12-07 | Discharge: 2022-12-07 | Payer: 59

## 2022-12-07 DIAGNOSIS — C186 Malignant neoplasm of descending colon: Secondary | ICD-10-CM

## 2022-12-07 DIAGNOSIS — C50411 Malignant neoplasm of upper-outer quadrant of right female breast: Secondary | ICD-10-CM

## 2022-12-07 LAB — COMPREHENSIVE METABOLIC PANEL
~~LOC~~ BKR ALBUMIN: 3.6 g/dL (ref 3.5–5.0)
~~LOC~~ BKR ALT: 9 U/L (ref 7–56)
~~LOC~~ BKR AST: 23 U/L (ref 7–40)
~~LOC~~ BKR BLD UREA NITROGEN: 9 mg/dL (ref 7–25)
~~LOC~~ BKR CALCIUM: 9 mg/dL — ABNORMAL HIGH (ref 8.5–10.6)
~~LOC~~ BKR CHLORIDE: 106 mmol/L — ABNORMAL LOW (ref 98–110)
~~LOC~~ BKR CREATININE: 0.9 mg/dL (ref 0.40–1.00)
~~LOC~~ BKR GLOMERULAR FILTRATION RATE (GFR): >60 mL/min (ref >60–4.80)
~~LOC~~ BKR POTASSIUM: 4 mmol/L — ABNORMAL LOW (ref 3.5–5.1)
~~LOC~~ BKR TOTAL BILIRUBIN: 0.4 mg/dL (ref 0.3–1.2)

## 2022-12-07 LAB — CBC AND DIFF
~~LOC~~ BKR ABSOLUTE BASO COUNT: 0.1 10*3/uL (ref 0.00–0.20)
~~LOC~~ BKR ABSOLUTE EOS COUNT: 0.2 10*3/uL (ref 0.00–0.45)
~~LOC~~ BKR ABSOLUTE MONO COUNT: 0.5 10*3/uL (ref 0.00–0.80)
~~LOC~~ BKR ABSOLUTE NEUTROPHIL: 4 10*3/uL (ref 1.80–7.00)
~~LOC~~ BKR BASOPHILS %: 1 % (ref 0–2)
~~LOC~~ BKR LYMPHOCYTES %: 17 % — ABNORMAL LOW (ref 24–44)
~~LOC~~ BKR MCV: 84 fL — ABNORMAL HIGH (ref 80.0–100.0)
~~LOC~~ BKR PLATELET COUNT: 177 10*3/uL (ref 150–400)
~~LOC~~ BKR RBC COUNT: 3.7 10*6/uL — ABNORMAL LOW (ref 4.00–5.00)
~~LOC~~ BKR WBC COUNT: 5.7 10*3/uL (ref 4.5–11.0)

## 2022-12-07 LAB — CEA(CARCINOEMBRYONIC AG): ~~LOC~~ BKR CEA: 0.6 ng/mL (ref <3.0)

## 2022-12-07 MED ORDER — DEXAMETHASONE 4 MG PO TAB
8 mg | Freq: Once | ORAL | 0 refills
Start: 2022-12-07 — End: ?

## 2022-12-07 MED ORDER — PALONOSETRON 0.25 MG/5 ML IV SOLN
.25 mg | Freq: Once | INTRAVENOUS | 0 refills
Start: 2022-12-07 — End: ?

## 2022-12-07 MED ORDER — FLUOROURACIL IV AMB PUMP
2000 mg/m2 | Freq: Once | INTRAVENOUS | 0 refills | Status: CP
Start: 2022-12-07 — End: ?
  Administered 2022-12-07 (×3): 4420 mg via INTRAVENOUS

## 2022-12-07 MED ORDER — FLUOROURACIL IV AMB PUMP
2000 mg/m2 | Freq: Once | INTRAVENOUS | 0 refills
Start: 2022-12-07 — End: ?

## 2022-12-07 MED ORDER — DEXAMETHASONE 4 MG PO TAB
8 mg | Freq: Once | ORAL | 0 refills | Status: CP
Start: 2022-12-07 — End: ?
  Administered 2022-12-07: 15:00:00 8 mg via ORAL

## 2022-12-07 MED ORDER — OXALIPLATIN IVPB
85 mg/m2 | Freq: Once | INTRAVENOUS | 0 refills | Status: CP
Start: 2022-12-07 — End: ?
  Administered 2022-12-07 (×2): 187.85 mg via INTRAVENOUS

## 2022-12-07 MED ORDER — DEXAMETHASONE 4 MG PO TAB
8 mg | Freq: Once | ORAL | 0 refills | Status: CN
Start: 2022-12-07 — End: ?

## 2022-12-07 MED ORDER — LORAZEPAM 0.5 MG PO TAB
.5 mg | Freq: Once | ORAL | 0 refills | Status: CP
Start: 2022-12-07 — End: ?
  Administered 2022-12-07: 15:00:00 0.5 mg via ORAL

## 2022-12-07 MED ORDER — LOPERAMIDE 2 MG PO CAP
2 mg | Freq: Once | ORAL | 0 refills | Status: CP
Start: 2022-12-07 — End: ?
  Administered 2022-12-07: 15:00:00 2 mg via ORAL

## 2022-12-07 MED ORDER — PALONOSETRON 0.25 MG/5 ML IV SOLN
.25 mg | Freq: Once | INTRAVENOUS | 0 refills | Status: CP
Start: 2022-12-07 — End: ?
  Administered 2022-12-07: 15:00:00 0.25 mg via INTRAVENOUS

## 2022-12-07 NOTE — Progress Notes
Name: Jennifer Durham          MRN: [id number]      DOB: 09/30/1975      AGE: 47 y.o.   DATE OF SERVICE: 12/07/2022    Subjective:             Reason for Visit:  Follow Up      Jennifer Durham is a 47 y.o. female.      Cancer Staging   Colon cancer [facility])  Staging form: Colon And Rectum, AJCC 8th Edition  - Clinical stage from 07/23/2022: Stage IIIB (cT3, cN2a, cM0) - Signed by Miguel Rota, MD on 08/11/2022    Malignant neoplasm of upper-outer quadrant of right breast in female, estrogen receptor positive (HCC)  Staging form: Breast, AJCC 8th Edition  - Clinical stage from 07/04/2019: Stage IA (cT1c, cN0(f), cM0, G2, ER+, PR+, HER2-) - Signed by Guy Begin, PA-C on 07/12/2019  - Pathologic stage from 08/21/2019: Stage IA (pT1c, pN1a, cM0, G2, ER+, PR+, HER2-) - Signed by Guy Begin, PA-C on 08/30/2019      History of Present Illness    47 y.o. female with hx of stage IA ER/PR+, HER2- IDC of the right breast diagnosed in 06/2019 after detecting dimpling of her breast in 05/2019. She presented for mammogram which identified a spiculated mass in the upper right medial breast. She underwent ultrasound and biopsy that confirmed carcinoma . Pathology review at [facility] confirmed grade 2 IDC with a focal lobular growth pattern and lymphoid tissue with no evidence of carcinoma in the right axillary specimen. She was recommended to undergo upfront surgery. On 08/01/2019, she underwent bilateral skin sparing mastectomy and right sentinel lymph node biopsy, axilla lymph node dissection, and reconstruction.  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  A 5 mm focus of metastatic carcinoma was found in 1 of 4 sentinel lymph nodes.  0 of 27 additional axillary nodes were involved. pT1c N1a M0 (1 of 34 LN+) M0. No LVI, No EIC, No ENE. She was recommended to receive adjuvant chemotherapy with TC x4 cycles,  underwent adjuvant radiotherapy to her right reconstructed breast, chest wall and regional nodes to a dose of 4526 cGy in 16 fractions from 02/19/2020 - 03/11/2020.      She is on tamoxifen and zoladex.     history of hypertension, polycystic ovarian disease, and prediabetesa colonoscopy     She had a colonoscopy performed on May 29th, 2024. The colonoscopy revealed a moderately differentiated adenocarcinoma suspected to be at the splenic flexure, 60 cm from the anal verge, and a large rectosigmoid polyp described as pedunculated. The polyp was biopsied but not removed and was identified as a tubulovillous adenoma. CT of the chest, abdomen, and pelvis, which showed no evidence of metastatic disease. The patient's CEA level was 0.8, and she was found to be microsatellite stable.     07/23/2022  Extended left hemi colectomy: pT3N2 (LN 5/48) mod diff adenocarcinoma, Lymphatic and / or Vascular Invasion: Present; Perineural Invasion--Present        Here for consultation about adjuvant chemotherapy. Overall recovering from surgery reasonable well, only complaint is mild intermittent lightheadedness, no F/C/N/V/D, no pain, no SOB, no DOE.         Interval History,     The patient presents for her next adjuvant chemotherapy. She reports increasing side effects from the chemotherapy, including nausea, vomiting, and diarrhea. These  symptoms have been progressively worsening, with the patient now experiencing them on the way home from treatment. She has been managing these symptoms with Imodium and lorazepam, which she took after treatment.    The patient also reports feeling cold after treatment and has noticed a build-up of the chemotherapy's effects over time. She has been trying to stay hydrated and maintain her normal activities, including work, despite these side effects.       Review of Systems   Constitutional:  Positive for fatigue. Negative for activity change, appetite change, chills, diaphoresis, fever and unexpected weight change.   HENT:  Negative for facial swelling, mouth sores, sore throat and trouble swallowing.    Eyes: Negative.    Respiratory: Negative.  Negative for cough, chest tightness, shortness of breath and wheezing.    Cardiovascular: Negative.  Negative for chest pain, palpitations and leg swelling.   Gastrointestinal:  Positive for diarrhea and nausea. Negative for abdominal distention, abdominal pain, anal bleeding, blood in stool, constipation, rectal pain and vomiting.   Endocrine: Positive for cold intolerance.   Genitourinary: Negative.    Musculoskeletal: Negative.  Negative for arthralgias, back pain, neck pain and neck stiffness.   Skin: Negative.  Negative for color change, pallor, rash and wound.   Neurological: Negative.  Negative for dizziness, weakness, light-headedness, numbness and headaches.   Psychiatric/Behavioral: Negative.          Past Medical History:   Diagnosis Date    Allergy     Anxiety     Anxiety disorder     Breast cancer (HCC) 07/04/2019    right    Cancer of colon (HCC) 06/23/22    Depression 11/25/2021    Depressive disorder, not elsewhere classified     Heartburn     Hx of radiation therapy 2022    Hyperlipemia 11/23    Hypertension     Hypothyroidism 09/2020    Insulin resistance     Limb alert care status     No venipuncture/BP's to RUE    Obesity     PCOS (polycystic ovarian syndrome)     Personal history of irradiation 02/2020    Seasonal allergies     Frequent sinusitis    Tachyarrhythmia      Surgical History:   Procedure Laterality Date    HX TONSILLECTOMY  2001    turbinates/adenoids 2015    HX TONSIL AND ADENOIDECTOMY  2011    LEFT PERONEAL TENDON REPAIR Left 12/15/2016    Performed by Tanja Port, MD at [facility] OR    Left Skin Sparing Mastectomy Left 08/21/2019    Performed by Renford Dills, MD at [facility] OR    IDENTIFICATION SENTINEL LYMPH NODE Right 08/21/2019    Performed by Renford Dills, MD at [facility] OR    INJECTION RADIOACTIVE TRACER FOR SENTINEL NODE IDENTIFICATION Right 08/21/2019    Performed by Renford Dills, MD at [facility] OR    RADIOLOGICAL EXAM SURGICAL SPECIMEN Right 08/21/2019    Performed by Renford Dills, MD at [facility] OR    MASTECTOMY - MODIFIED RADICAL WITH AXILLARY LYMPH NODE DISSECTION Right 08/21/2019    Performed by Renford Dills, MD at [facility] OR    RECONSTRUCTION BREAST WITH TISSUE EXPANDER AND SUBSEQUENT EXPANSION - IMMEDIATE/ DELAYED Bilateral 08/21/2019    Performed by Marlinda Mike, MD at [facility] OR    IMPLANTATION BIOLOGIC IMPLANT FOR SOFT TISSUE REINFORCEMENT Bilateral 08/21/2019    Performed by Marlinda Mike, MD at  [facility] OR    RECONSTRUCTION BREAST WITH DEEP INFERIOR EPIGASTRIC PERFORATOR FLAP/ SUPERFICIAL INFERIOR EPIGASTRIC ARTERY FLAP Bilateral 07/02/2020    Performed by Marlinda Mike, MD at [facility] OR    RECONSTRUCTION BREAST WITH FREE FLAP - request 22 modifier, complicated procedure due to obesity and intramuscular course Bilateral 07/02/2020    Performed by Marlinda Mike, MD at [facility] OR    INTRAVENOUS INJECTION AGENT TO TEST VASCULAR FLOW IN FLAP/ GRAFT Bilateral 07/02/2020    Performed by Marlinda Mike, MD at [facility] OR    APPLICATION NEGATIVE PRESSURE WOUND THERAPY Bilateral 07/02/2020    Performed by Marlinda Mike, MD at [facility] OR    IMPLANTATION MESH CLOSURE OF DEBRIDEMENT FOR NECROTIZING SOFT TISSUE INFECTION Bilateral 07/02/2020    Performed by Marlinda Mike, MD at [facility] OR    REMOVAL TISSUE EXPANDER Bilateral 07/02/2020    Performed by Marlinda Mike, MD at [facility] OR    INCISION AND DRAINAGE HEMATOMA/ SEROMA - TORSO N/A 08/05/2020    Performed by Marlinda Mike, MD at [facility] OR    REPAIR INTERMEDIATE WOUND 12.6 CM TO 20.0 CM - TRUNK - total length 24cm  08/05/2020    Performed by Marlinda Mike, MD at [facility] OR    GRAFTING AUTOLOGOUS FAT BY LIPOSUCTION TO TRUNK/ BREASTS/ SCALP/ ARMS/ LEGS - 50 CC OR LESS 60cc to the R, 90cc to the L Bilateral 08/10/2021    Performed by Marlinda Mike, MD at [facility] OR    GRAFTING AUTOLOGOUS FAT BY LIPOSUCTION TO TRUNK/ BREASTS/ SCALP/ ARMS/ LEGS - EACH ADDITIONAL 50 CC Bilateral 08/10/2021    Performed by Marlinda Mike, MD at [facility] OR    REVISION RECONSTRUCTED BREAST Left 08/10/2021    Performed by Marlinda Mike, MD at [facility] OR    REPAIR INTERMEDIATE WOUND  2.5 CM OR LESS - TORSO abdominal dogear removal R side - 9cm, L side - 10cm N/A 08/10/2021    Performed by Marlinda Mike, MD at [facility] OR    COLONOSCOPY DIAGNOSTIC WITH SPECIMEN COLLECTION BY BRUSHING/ WASHING - FLEXIBLE N/A 06/23/2022    Performed by Buckles, Vinnie Level, MD at [facility] OR    ROBOT ASSISTED LEFT COLECTOMY, CONVERTED TO OPEN N/A 07/23/2022    Performed by Benetta Spar, MD at [facility] OR    ROBOT ASSISTED SURGERY N/A 07/23/2022    Performed by Benetta Spar, MD at [facility] OR    BREAST SURGERY      CARDIOVASCULAR STRESS TEST      COSMETIC SURGERY      ECHOCARDIOGRAM PROCEDURE      ELECTROCARDIOGRAM       Family History   Problem Relation Name Age of Onset    Colon Polyps Mother Merri Brunette     Heart Attack Mother Merri Brunette     Diabetes Mother Merri Brunette     [facility] Mother Merri Brunette 50    Heart Disease Mother Merri Brunette     Hypertension Mother Merri Brunette     High Cholesterol Mother Merri Brunette     Sudden Cardiac Death Mother Merri Brunette     Cancer Mother Merri Brunette     Heart Disease Father Rockwell [place]     Hypothyroid Father Rockwell [place]     Hypertension Father Rockwell [place]     Thyroid Disease Father Rockwell [place]     High Cholesterol Father Rockwell [place]     Hypertension Brother Royal Hawthorn  Thyroid Disease Brother Royal Hawthorn     [facility] Maternal Azzie Almas 29 - 52    Diabetes Maternal Aunt Wilma Podzimek     Arthritis-rheumatoid Maternal Aunt Wilma Podzimek     Cancer-Breast Maternal Aunt Wilma Podzimek     Cancer-Ovarian Maternal Aunt [place] Podzimek     Diabetes Maternal Amie Critchley     Heart Disease Maternal Bevelyn Ngo     Sudden Cardiac Death Maternal Bevelyn Ngo     Thyroid Disease Brother Magda Bernheim     Cancer-Breast Paternal Cousin  44    Cancer-Ovarian Maternal Aunt  50 - 59    Cancer-Breast Maternal Aunt Elnora 40 - 49    Cancer-Prostate Maternal Uncle Eldon 87 - 79    Cancer-Ovarian Maternal Uncle Eldon      Social History     Socioeconomic History    Marital status: Married   Tobacco Use    Smoking status: Never     Passive exposure: Never    Smokeless tobacco: Never   Vaping Use    Vaping status: Never Used   Substance and Sexual Activity    Alcohol use: Yes     Alcohol/week: 2.0 standard drinks of alcohol     Types: 2 Drinks containing 0.5 oz of alcohol per week     Comment: socially    Drug use: Never    Sexual activity: Yes     Partners: Male     Birth control/protection: I.U.D.     Vaping/E-liquid Use    Vaping Use Never User                    Objective:          carvediloL (COREG) 6.25 mg tablet Take one tablet by mouth twice daily with meals. Take with food.    CHOLEcalciferoL (vitamin D3) (OPTIMAL D3) 50,000 units capsule Take one capsule by mouth every 7 days. 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    escitalopram oxalate (LEXAPRO) 10 mg tablet TAKE ONE TABLET BY MOUTH EVERY DAY    ferrous sulfate (IRON) 325 mg (65 mg iron) tablet Take one tablet by mouth daily. Take on an empty stomach at least 1 hour before or 2 hours after food.    INTRAUTERINE DEVICE (IUD) IU by Intrauterine route.    levothyroxine (SYNTHROID) 50 mcg tablet Take one tablet by mouth daily.    lidocaine/prilocaine (EMLA) 2.5/2.5 % topical cream Apply pea sized amount of cream in a thick layer over port site 45 minutes prior to access, cover with transparent dressing    loperamide (IMODIUM A-D) 2 mg capsule TAKE ONE CAPSULE BY MOUTH THREE TIMES DAILY, 30-45 MINUTES prior to meals    LORazepam (ATIVAN) 0.5 mg tablet Take 1 tab by mouth every 6 hrs as needed N/V not controlled by Zofran or Compazine. May also use every 6 hrs as needed anxiety or at bedtime insomnia.    losartan (COZAAR) 25 mg tablet Take one-half tablet by mouth daily.    metFORMIN (GLUCOPHAGE) 1,000 mg tablet Take one tablet by mouth daily with breakfast.    montelukast (SINGULAIR) 10 mg tablet Take one tablet by mouth at bedtime daily.    OLANZapine (ZYPREXA) 5 mg  tablet Take one tablet by mouth at bedtime daily. Indications: nausea and vomiting caused by cancer drugs    ondansetron HCL (ZOFRAN) 8 mg tablet Take one tablet by mouth every 8 hours as needed for Nausea or Vomiting. Avoid on Days 1-2 of each chemotherapy cycle.    prochlorperazine maleate (COMPAZINE) 10 mg tablet Take one tablet by mouth every 6 hours as needed for Nausea or Vomiting.    rosuvastatin (CRESTOR) 5 mg tablet Take one tablet by mouth daily.    tamoxifen (NOLVADEX) 20 mg tablet TAKE ONE TABLET BY MOUTH EVERY NIGHT AT BEDTIME    tirzepatide (MOUNJARO) 7.5 mg/0.5 mL injector PEN Inject 0.5 mL under the skin every 7 days. wednesday    traMADoL (ULTRAM) 50 mg tablet Take one tablet to two tablets by mouth every 6 hours as needed. Indications: pain     Vitals:    12/07/22 0747 12/07/22 0748   BP: 104/75    BP Source: Arm, Left Upper    Pulse: 90    Temp: 36.2 ?C (97.2 ?F)    Resp: 16    SpO2: 98%    O2 Device: None (Room air)    TempSrc: Temporal    PainSc:  Zero   Weight: 126.4 kg (278 lb 9.6 oz)      Body mass index is 37.78 kg/m?Marland Kitchen     Pain Score: Zero       Fatigue Scale: 4    Pain Addressed:  N/A    Patient Evaluated for a Clinical Trial: Patient currently enrolled in a [place] treatment clinical trial.     Eastern Cooperative Oncology Group performance status is 0, Fully active, able to carry on all pre-disease performance without restriction.Marland Kitchen     Physical Exam  Vitals reviewed.   Constitutional:       General: She is not in acute distress.     Appearance: Normal appearance. She is well-developed. She is not ill-appearing, toxic-appearing or diaphoretic.   HENT:      Head: Normocephalic and atraumatic.      Nose: Nose normal. No rhinorrhea.      Mouth/Throat:      Mouth: Mucous membranes are moist. Mucous membranes are not pale. No oral lesions.      Pharynx: Oropharynx is clear. No oropharyngeal exudate or posterior oropharyngeal erythema.      Tonsils: No tonsillar abscesses.   Eyes:      General: No scleral icterus.        Right eye: No discharge.         Left eye: No discharge.      Extraocular Movements: Extraocular movements intact.      Conjunctiva/sclera: Conjunctivae normal.      Pupils: Pupils are equal, round, and reactive to light.   Cardiovascular:      Rate and Rhythm: Normal rate and regular rhythm.      Pulses: Normal pulses.      Heart sounds: Normal heart sounds. No murmur heard.     No gallop.   Pulmonary:      Effort: Pulmonary effort is normal. No respiratory distress.      Breath sounds: Normal breath sounds. No stridor. No wheezing, rhonchi or rales.   Chest:      Chest wall: No tenderness.   Abdominal:      General: Abdomen is flat. A surgical scar is present. Bowel sounds are normal. There is no distension.      Palpations: Abdomen is soft. There is no  mass.      Tenderness: There is no abdominal tenderness. There is no guarding or rebound.      Hernia: No hernia is present.          Comments: Firm, nontender scar tissue palpated under surgical incision   Musculoskeletal:         General: No swelling, tenderness, deformity or signs of injury. Normal range of motion.      Cervical back: Normal range of motion and neck supple. No rigidity. No muscular tenderness.      Right lower leg: No edema.      Left lower leg: No edema.   Lymphadenopathy:      Cervical: No cervical adenopathy.   Skin:     General: Skin is warm and dry.      Coloration: Skin is not jaundiced or pale.      Findings: No bruising, erythema, lesion or rash.   Neurological:      General: No focal deficit present.      Mental Status: She is alert and oriented to person, place, and time. Mental status is at baseline.      Cranial Nerves: No cranial nerve deficit.      Motor: No weakness.      Coordination: Coordination normal.      Gait: Gait normal.   Psychiatric:         Mood and Affect: Mood normal.         Behavior: Behavior normal.         Thought Content: Thought content normal.         Judgment: Judgment normal.          07/23/2022  Pathology    Final Diagnosis:     A. 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      B. Colon, extended left hemi colectomy:    Adenocarcinoma moderately differentiated. See comment.   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 Additional proximal margin#1, excision:   Colon tissue with Tubular adenomas. See comment.   Three lymph nodes negative for metastatic carcinoma (0/3).     D. Additional proximal margins #2, excision:    Tubulovillous adenoma with focal high-grade dysplasia   Eleven lymph nodes negative for metastatic carcinoma (0/11).       Comment:   Pursuant to the Quality Assurance Program at the [facility] Pathology Department, selected slides from this case have been   concurrently reviewed by the following pathologist: Dr. Juanetta Beets   who   agrees with the final diagnosis.       CASE SUMMARY: (COLON AND RECTUM: Resection)   Standard(s): AJCC-UICC 8   CAP Version: Colon and Rectum Resection 4.3.0.0     SPECIMEN     Procedure   ___ Subtotal (extended) left abdominal colectomy     Macroscopic Evaluation of Mesorectum (required for rectal cancers)   ___ Not applicable     TUMOR     Tumor Site (select all that apply)   ___ Transverse colon/Splenic flexure     Histologic Type   ___ Adenocarcinoma     Histologic Grade   ___ G2, moderately differentiated     Tumor Size   ___ Greatest dimension in Centimeters (cm): 4.0 x 3.8 x 0.7 cm  Multiple Primary Sites (e.g., hepatic flexure and transverse colon)   ___ Not applicable (no additional primary site(s) present)     Tumor Extent   ___ Invades through muscularis propria into the pericolonic or perirectal   tissue     Sub-mucosal Invasion (required only for pT1 tumors)   ___ Not applicable (not a pT1 tumor)     Macroscopic Tumor Perforation   ___ Not identified     Lymphatic and / or Vascular Invasion(select all that apply)   ___ Present (not otherwise specified)     Perineural Invasion   ___ Present     Tumor Budding Score   ___ Intermediate (5-9)     Treatment Effect   ___ No known presurgical therapy     MARGINS     Margin Status for Invasive Carcinoma   ___ All margins negative for invasive carcinoma   +Closest Margin(s) to Invasive Carcinoma (select all that apply)   ___ Radial (circumferential)/Mesenteric   +Distance from Invasive Carcinoma to Closest Margin   Specify in Centimeters (cm)   ___ Exact distance in cm: 3.4 cm   Distance from Invasive Carcinoma to Radial (Circumferential) Margin   (required for rectal   tumors)   ___ Distance already reported as closest margin.   +Distance from Invasive Carcinoma to Distal Margin (recommended for rectal   tumors)   Specify in Centimeters (cm)   ___ Exact distance in cm: 58.0 cm   Margin(s) Involved by Invasive Carcinoma (select all that apply)   ___ Not applicable     Margin Status for Non-Invasive Tumor (select all that apply)   ___ All margins negative for high-grade dysplasia / intramucosal carcinoma   and low-grade dysplasia     REGIONAL LYMPH NODES     Regional Lymph Node Status   ___ Regional lymph nodes present   ___ Tumor present in regional lymph nodes   Number of Lymph Nodes with Tumor   ___ Exact number (specify): 5   Number of Lymph Nodes Examined   ___ Exact number (specify): 34     Tumor Deposits   ___ Not identified     DISTANT METASTASIS     Distant Site(s) Involved, if applicable (select all that apply)   ___ Not applicable     pTNM CLASSIFICATION (AJCC 8th Edition)   Reporting of pT, pN, and (when applicable) pM categories is based on   information available to the pathologist at the time the report is issued.   As per the AJCC (Chapter 1, 8th Ed.) it is the managing physician's   responsibility to establish the final pathologic stage based upon all   pertinent information, including but potentially not limited to this   pathology report.     Modified Classification (required only if applicable) (select all that   apply)   ___ Not applicable     pT Category   ___ pT3: Tumor invades through the muscularis propria into pericolorectal   tissues     T Suffix (required only if applicable)   ___ Not applicable     pN Category   pN2: Four or more regional nodes are positive   ___ pN2a: Four to six regional lymph nodes are positive     pM Category (required only if confirmed pathologically)   ___ Not applicable - pM cannot be determined from the submitted   specimen(s)     ADDITIONAL FINDINGS     +Additional Findings (select all that apply)   ___ Adenoma(s) (Tubulovillous  adenoma with high grade dysplasia, 6.3 cm.   1.9 cm., rectosigmoid; tubular adenomas)      Latest Reference Range & Units 12/07/22 07:12   Hemoglobin 12.0 - 15.0 g/dL 16.1 (L)   Hematocrit 09.6 - 45.0 % 32.0 (L)   Platelet Count 150 - 400 10*3/uL 177   White Blood Cells 4.5 - 11.0 10*3/uL 5.7   Neutrophils 41 - 77 % 70   Absolute Neutrophil Count 1.80 - 7.00 10*3/uL 4.00   Lymphocytes 24 - 44 % 17 (L)   Absolute Lymph Count 1.00 - 4.80 10*3/uL 1.00   Monocytes 4 - 12 % 8   Absolute Monocyte Count 0.00 - 0.80 10*3/uL 0.50   Eosinophils 0 - 5 % 4   Absolute Eosinophil Count 0.00 - 0.45 10*3/uL 0.20   Absolute Basophil Count 0.00 - 0.20 10*3/uL 0.10   Basophils 0 - 2 % 1   Nucleated RBCs  *   RBC 4.00 - 5.00 10*6/uL 3.78 (L)   MCV 80.0 - 100.0 fL 84.5   MCH 26.0 - 34.0 pg 27.9   MCHC 32.0 - 36.0 g/dL 04.5   MPV 7.0 - 40.9 fL 7.1   RDW 11.0 - 15.0 % 18.6 (H)   Sodium 137 - 147 mmol/L 140 Potassium 3.5 - 5.1 mmol/L 4.0   Chloride 98 - 110 mmol/L 106   CO2 21 - 30 mmol/L 27   Anion Gap 3 - 12  7   Blood Urea Nitrogen 7 - 25 mg/dL 9   Creatinine 8.11 - 9.14 mg/dL 7.82   Glucose 70 - 956 mg/dL 213 (H)   Albumin 3.5 - 5.0 g/dL 3.6   Calcium 8.5 - 08.6 mg/dL 9.0   Total Bilirubin 0.3 - 1.2 mg/dL 0.4   Total Protein 6.0 - 8.0 g/dL 6.8   Glomerular Filtration Rate (GFR) >60 mL/min >60   AST (SGOT) 7 - 40 U/L 23   ALT (SGPT) 7 - 56 U/L 9   Alk Phosphatase 25 - 110 U/L 42   (L): Data is abnormally low  (H): Data is abnormally high    Assessment and Plan:    47 yr old female with history of right breast cancer (07/2019): ER/PR+, HER2- IDC pT1c N1a M0 (1 of 34 LN+) M0.  on  tamoxifen and zoladex. Who has newly diagnosed left colon adenocarcinoma s/P surgery: pT3N2 (LN 5/48) mod diff , positive for  Lymphatic and / or Vascular Invasion, and Perineural Invasion.   Here for consultation about adjuvant chemotherapy. Overall recovering from surgery reasonable well, only complaint is mild intermittent lightheadedness, no F/C/N/V/D, no pain, no SOB, no DOE.   Accompanied by her husband.     PS ECOG 0    Reviewed images and pathology with the pt and husband about her stage of disease, the risk of MRD/recurrence of disease, the rationale/mechanism of adjuvant treatment, risk vs benefit, regimen of FOLFOX (a schedule, timing, length), and potential side effects. (Info given and consent done)   Also discussed the surveillance   We discussed the study of ctDNA (C-14), the pt is interested in    Continues to have adjuvant chemo  Cont follow up with new labs, clinically doing well, tolerate the treatment well,  no F/C/N/V/D, noticed some urgent and discomfort in urination.     Lab reviewed- mild increasing Cr.     Plan:      Undergoing chemotherapy with reported side effects of nausea, vomiting, and diarrhea. Recent CT scan shows stable lung nodule  and resolving postoperative changes in the right abdomen. Tumor markers have been negative.  -Continue current chemotherapy regimen  C8 FOLFOX.  -Increase Decadron to two pills to help prevent nausea.  -Provide Imodium at the end of chemo to prevent diarrhea.  -Offer Ativan at the start and end of chemo to manage nausea.  -RTC in 2 weeks with labs and visit, then C9  -Reschedule treatment on 01/18/2023 to 01/25/2023 to avoid having the pump during Christmas.  -Next treatment on 12/14/2022, then 01/04/2023, and 01/25/2023.  Renal Function improved. Continue current efforts to maintain oral hydration given IVF shortage.   Continue supportive measures.   Pt to call with any questions, concerns or symptoms at any time and we can see her sooner.           Turner Daniels, APRN-NP        The patient and family were allowed to ask questions and voice concerns; these were addressed to the best of our ability. They expressed understanding of what was explained to them, and they agreed with the present plan. RTC in 2 weeks with labs to see a provider. Patient [facility] the phone numbers for the [facility] and was instructed on how to contact us with any questions or concerns. My collaborating physician on this case is Dr. Wynelle Link.

## 2022-12-07 NOTE — Progress Notes
Cancer [facility] Note  Verified consent signed and in chart. yes    Blood return positive via: Port (Single)    BSA and dose double checked (agree with orders as written) with: yes     Labs/applicable tests checked: CBC and Comprehensive Metabolic Panel (CMP)    Treatment regimen: Drug/cycle/day  C 1 D 8 Oxalplatin and 5 FU pump     Rate verified and armband double check with second RN: yes    Patient education offered and stated understanding. Denies questions at this time.     Pt seen and assessed in clinic. Premeds given. Infusion given and completed. 5 FU pump per Dierdre Highman. Pt left tx area in stable condition

## 2022-12-09 ENCOUNTER — Encounter: Admit: 2022-12-09 | Discharge: 2022-12-09 | Payer: 59

## 2022-12-09 ENCOUNTER — Encounter: Admit: 2022-12-09 | Discharge: 2022-12-09 | Payer: PRIVATE HEALTH INSURANCE

## 2022-12-09 DIAGNOSIS — C186 Malignant neoplasm of descending colon: Secondary | ICD-10-CM

## 2022-12-09 MED ORDER — LORAZEPAM 0.5 MG PO TAB
ORAL_TABLET | ORAL | 3 refills | 12.00000 days | Status: AC
Start: 2022-12-09 — End: ?

## 2022-12-13 ENCOUNTER — Encounter: Admit: 2022-12-13 | Discharge: 2022-12-13 | Payer: 59

## 2022-12-13 ENCOUNTER — Encounter: Admit: 2022-12-13 | Discharge: 2022-12-13 | Payer: PRIVATE HEALTH INSURANCE

## 2022-12-13 DIAGNOSIS — C186 Malignant neoplasm of descending colon: Secondary | ICD-10-CM

## 2022-12-15 ENCOUNTER — Encounter: Admit: 2022-12-15 | Discharge: 2022-12-15 | Payer: PRIVATE HEALTH INSURANCE

## 2022-12-16 ENCOUNTER — Encounter: Admit: 2022-12-16 | Discharge: 2022-12-16 | Payer: PRIVATE HEALTH INSURANCE

## 2022-12-17 ENCOUNTER — Encounter: Admit: 2022-12-17 | Discharge: 2022-12-17 | Payer: PRIVATE HEALTH INSURANCE

## 2022-12-17 ENCOUNTER — Encounter: Admit: 2022-12-17 | Discharge: 2022-12-17 | Payer: 59

## 2022-12-17 DIAGNOSIS — K591 Functional diarrhea: Secondary | ICD-10-CM

## 2022-12-17 NOTE — Progress Notes
Name: Jennifer Durham          MRN: [id number]      DOB: [birth year]      AGE: 47 y.o.   DATE OF SERVICE: 12/20/2022    Subjective:             Reason for Visit:  Follow Up      Jennifer Durham is a 47 y.o. female.      Cancer Staging   Colon cancer [facility])  Staging form: Colon And Rectum, AJCC 8th Edition  - Clinical stage from 07/23/2022: Stage IIIB (cT3, cN2a, cM0) - Signed by Miguel Rota, MD on 08/11/2022    Malignant neoplasm of upper-outer quadrant of right breast in female, estrogen receptor positive (HCC)  Staging form: Breast, AJCC 8th Edition  - Clinical stage from 07/04/2019: Stage IA (cT1c, cN0(f), cM0, G2, ER+, PR+, HER2-) - Signed by Guy Begin, PA-C on 07/12/2019  - Pathologic stage from 08/21/2019: Stage IA (pT1c, pN1a, cM0, G2, ER+, PR+, HER2-) - Signed by Guy Begin, PA-C on 08/30/2019      History of Present Illness    47 y.o. female with hx of stage IA ER/PR+, HER2- IDC of the right breast diagnosed in 06/2019 after detecting dimpling of her breast in 05/2019. She presented for mammogram which identified a spiculated mass in the upper right medial breast. She underwent ultrasound and biopsy that confirmed carcinoma . Pathology review at [facility] confirmed grade 2 IDC with a focal lobular growth pattern and lymphoid tissue with no evidence of carcinoma in the right axillary specimen. She was recommended to undergo upfront surgery. On 08/01/2019, she underwent bilateral skin sparing mastectomy and right sentinel lymph node biopsy, axilla lymph node dissection, and reconstruction.  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  A 5 mm focus of metastatic carcinoma was found in 1 of 4 sentinel lymph nodes.  0 of 27 additional axillary nodes were involved. pT1c N1a M0 (1 of 34 LN+) M0. No LVI, No EIC, No ENE. She was recommended to receive adjuvant chemotherapy with TC x4 cycles,  underwent adjuvant radiotherapy to her right reconstructed breast, chest wall and regional nodes to a dose of 4526 cGy in 16 fractions from 02/19/2020 - 03/11/2020.      She is on tamoxifen and zoladex.     history of hypertension, polycystic ovarian disease, and prediabetesa colonoscopy     She had a colonoscopy performed on May 29th, 2024. The colonoscopy revealed a moderately differentiated adenocarcinoma suspected to be at the splenic flexure, 60 cm from the anal verge, and a large rectosigmoid polyp described as pedunculated. The polyp was biopsied but not removed and was identified as a tubulovillous adenoma. CT of the chest, abdomen, and pelvis, which showed no evidence of metastatic disease. The patient's CEA level was 0.8, and she was found to be microsatellite stable.     07/23/2022  Extended left hemi colectomy: pT3N2 (LN 5/48) mod diff adenocarcinoma, Lymphatic and / or Vascular Invasion: Present; Perineural Invasion--Present        Here for consultation about adjuvant chemotherapy. Overall recovering from surgery reasonable well, only complaint is mild intermittent lightheadedness, no F/C/N/V/D, no pain, no SOB, no DOE.         Interval History,     The patient, with a history of cancer, presents for a follow-up after her most recent chemotherapy session. She reports that the anti-emetic  medications worked well last time, with no sickness or cold breathing after leaving. She received Ativan at the start and end of chemo, and increased Decadron was also part of the regimen. She continues to take two Decadron pills at home on the days after chemo.    The patient reports that her mouth feels fine, though a little sore at times. She describes a strange sensation in her tongue, as if it has lost its texture, but denies any actual sores or painful areas. She also denies any numbness or tingling in her hands and feet, though she does experience cold sensations.    The patient also mentions that she continues to increase oral hydration. She denies any pain, fevers, chills, N/V, mouth sores, diarrhea, constipation.            Review of Systems   Constitutional:  Positive for fatigue. Negative for activity change, appetite change, chills, diaphoresis, fever and unexpected weight change (gain).   HENT:  Negative for facial swelling, mouth sores, sore throat and trouble swallowing.    Eyes: Negative.    Respiratory: Negative.  Negative for cough, chest tightness, shortness of breath and wheezing.    Cardiovascular: Negative.  Negative for chest pain, palpitations and leg swelling.   Gastrointestinal:  Negative for abdominal distention, abdominal pain, anal bleeding, blood in stool, constipation, diarrhea, nausea, rectal pain and vomiting.   Endocrine: Positive for cold intolerance.   Genitourinary: Negative.    Musculoskeletal: Negative.  Negative for arthralgias, back pain, neck pain and neck stiffness.   Skin: Negative.  Negative for color change, pallor, rash and wound.   Neurological: Negative.  Negative for dizziness, weakness, light-headedness, numbness and headaches.   Psychiatric/Behavioral: Negative.          Past Medical History:   Diagnosis Date    Allergy     Anxiety     Anxiety disorder     Breast cancer (HCC) 07/04/2019    right    Cancer of colon (HCC) 06/23/22    Depression 11/25/2021    Depressive disorder, not elsewhere classified     Heartburn     Hx of radiation therapy 2022    Hyperlipemia 11/23    Hypertension     Hypothyroidism 09/2020    Insulin resistance     Limb alert care status     No venipuncture/BP's to RUE    Obesity     PCOS (polycystic ovarian syndrome)     Personal history of irradiation 02/2020    Seasonal allergies     Frequent sinusitis    Tachyarrhythmia      Surgical History:   Procedure Laterality Date    HX TONSILLECTOMY  2001    turbinates/adenoids 2015    HX TONSIL AND ADENOIDECTOMY  2011    LEFT PERONEAL TENDON REPAIR Left 12/15/2016    Performed by Tanja Port, MD at [facility] OR    Left Skin Sparing Mastectomy Left 08/21/2019    Performed by Renford Dills, MD at [facility] OR    IDENTIFICATION SENTINEL LYMPH NODE Right 08/21/2019    Performed by Renford Dills, MD at [facility] OR    INJECTION RADIOACTIVE TRACER FOR SENTINEL NODE IDENTIFICATION Right 08/21/2019    Performed by Renford Dills, MD at [facility] OR    RADIOLOGICAL EXAM SURGICAL SPECIMEN Right 08/21/2019    Performed by Renford Dills, MD at [facility] OR    MASTECTOMY - MODIFIED RADICAL WITH AXILLARY LYMPH NODE DISSECTION Right 08/21/2019  Performed by Renford Dills, MD at [facility] OR    RECONSTRUCTION BREAST WITH TISSUE EXPANDER AND SUBSEQUENT EXPANSION - IMMEDIATE/ DELAYED Bilateral 08/21/2019    Performed by Marlinda Mike, MD at [facility] OR    IMPLANTATION BIOLOGIC IMPLANT FOR SOFT TISSUE REINFORCEMENT Bilateral 08/21/2019    Performed by Marlinda Mike, MD at [facility] OR    RECONSTRUCTION BREAST WITH DEEP INFERIOR EPIGASTRIC PERFORATOR FLAP/ SUPERFICIAL INFERIOR EPIGASTRIC ARTERY FLAP Bilateral 07/02/2020    Performed by Marlinda Mike, MD at [facility] OR    RECONSTRUCTION BREAST WITH FREE FLAP - request 22 modifier, complicated procedure due to obesity and intramuscular course Bilateral 07/02/2020    Performed by Marlinda Mike, MD at [facility] OR    INTRAVENOUS INJECTION AGENT TO TEST VASCULAR FLOW IN FLAP/ GRAFT Bilateral 07/02/2020    Performed by Marlinda Mike, MD at [facility] OR    APPLICATION NEGATIVE PRESSURE WOUND THERAPY Bilateral 07/02/2020    Performed by Marlinda Mike, MD at [facility] OR    IMPLANTATION MESH CLOSURE OF DEBRIDEMENT FOR NECROTIZING SOFT TISSUE INFECTION Bilateral 07/02/2020    Performed by Marlinda Mike, MD at [facility] OR    REMOVAL TISSUE EXPANDER Bilateral 07/02/2020    Performed by Marlinda Mike, MD at [facility] OR    INCISION AND DRAINAGE HEMATOMA/ SEROMA - TORSO N/A 08/05/2020    Performed by Marlinda Mike, MD at [facility] OR    REPAIR INTERMEDIATE WOUND 12.6 CM TO 20.0 CM - TRUNK - total length 24cm  08/05/2020    Performed by Marlinda Mike, MD at [facility] OR    GRAFTING AUTOLOGOUS FAT BY LIPOSUCTION TO TRUNK/ BREASTS/ SCALP/ ARMS/ LEGS - 50 CC OR LESS 60cc to the R, 90cc to the L Bilateral 08/10/2021    Performed by Marlinda Mike, MD at [facility] OR    GRAFTING AUTOLOGOUS FAT BY LIPOSUCTION TO TRUNK/ BREASTS/ SCALP/ ARMS/ LEGS - EACH ADDITIONAL 50 CC Bilateral 08/10/2021    Performed by Marlinda Mike, MD at [facility] OR    REVISION RECONSTRUCTED BREAST Left 08/10/2021    Performed by Marlinda Mike, MD at [facility] OR    REPAIR INTERMEDIATE WOUND  2.5 CM OR LESS - TORSO abdominal dogear removal R side - 9cm, L side - 10cm N/A 08/10/2021    Performed by Marlinda Mike, MD at [facility] OR    COLONOSCOPY DIAGNOSTIC WITH SPECIMEN COLLECTION BY BRUSHING/ WASHING - FLEXIBLE N/A 06/23/2022    Performed by Buckles, Vinnie Level, MD at [facility] OR    ROBOT ASSISTED LEFT COLECTOMY, CONVERTED TO OPEN N/A 07/23/2022    Performed by Benetta Spar, MD at [facility] OR    ROBOT ASSISTED SURGERY N/A 07/23/2022    Performed by Benetta Spar, MD at [facility] OR    BREAST SURGERY      CARDIOVASCULAR STRESS TEST      COSMETIC SURGERY      ECHOCARDIOGRAM PROCEDURE      ELECTROCARDIOGRAM       Family History   Problem Relation Name Age of Onset    Colon Polyps Mother Merri Brunette     Heart Attack Mother Merri Brunette     Diabetes Mother Merri Brunette     [facility] Mother Merri Brunette 50    Heart Disease Mother Merri Brunette     Hypertension Mother Merri Brunette     High Cholesterol Mother Merri Brunette     Sudden Cardiac Death Mother Merri Brunette  Cancer Mother Merri Brunette     Heart Disease Father Rockwell [place]     Hypothyroid Father Rockwell [place]     Hypertension Father Rockwell [place]     Thyroid Disease Father Rockwell [place]     High Cholesterol Father Rockwell [place]     Hypertension Brother Royal Hawthorn     Thyroid Disease Brother Royal Hawthorn     [facility] Maternal Azzie Almas 11 - 91    Diabetes Maternal Aunt Wilma Podzimek     Arthritis-rheumatoid Maternal Aunt Wilma Podzimek     Cancer-Breast Maternal Aunt Wilma Podzimek     Cancer-Ovarian Maternal Aunt [place] Podzimek Diabetes Maternal Hart Carwin [place]     Heart Disease Maternal Bevelyn Ngo     Sudden Cardiac Death Maternal Bevelyn Ngo     Thyroid Disease Brother Magda Bernheim     Cancer-Breast Paternal Cousin  16    Cancer-Ovarian Maternal Aunt  50 - 59    Cancer-Breast Maternal Aunt Elnora 40 - 49    Cancer-Prostate Maternal Uncle Eldon 30 - 2    Cancer-Ovarian Maternal Uncle Eldon      Social History     Socioeconomic History    Marital status: Married   Tobacco Use    Smoking status: Never     Passive exposure: Never    Smokeless tobacco: Never   Vaping Use    Vaping status: Never Used   Substance and Sexual Activity    Alcohol use: Yes     Alcohol/week: 2.0 standard drinks of alcohol     Comment: socially    Drug use: Never    Sexual activity: Yes     Partners: Male     Birth control/protection: I.U.D.     Vaping/E-liquid Use    Vaping Use Never User                    Objective:          carvediloL (COREG) 6.25 mg tablet Take one tablet by mouth twice daily with meals. Take with food.    CHOLEcalciferoL (vitamin D3) (OPTIMAL D3) 50,000 units capsule Take one capsule by mouth every 7 days. 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    escitalopram oxalate (LEXAPRO) 10 mg tablet TAKE ONE TABLET BY MOUTH EVERY DAY    ferrous sulfate (IRON) 325 mg (65 mg iron) tablet Take one tablet by mouth daily. Take on an empty stomach at least 1 hour before or 2 hours after food.    INTRAUTERINE DEVICE (IUD) IU by Intrauterine route.    levothyroxine (SYNTHROID) 50 mcg tablet Take one tablet by mouth daily.    lidocaine/prilocaine (EMLA) 2.5/2.5 % topical cream Apply pea sized amount of cream in a thick layer over port site 45 minutes prior to access, cover with transparent dressing loperamide (IMODIUM A-D) 2 mg capsule TAKE ONE CAPSULE BY MOUTH THREE TIMES DAILY, 30-45 MINUTES prior to meals    LORazepam (ATIVAN) 0.5 mg tablet Take 1 tab by mouth every 6 hrs as needed N/V not controlled by Zofran or Compazine. May also use every 6 hrs as needed anxiety or at bedtime insomnia.    losartan (COZAAR) 25 mg tablet Take one-half tablet by mouth daily.  metFORMIN (GLUCOPHAGE) 1,000 mg tablet Take one tablet by mouth daily with breakfast.    montelukast (SINGULAIR) 10 mg tablet Take one tablet by mouth at bedtime daily.    OLANZapine (ZYPREXA) 5 mg tablet Take one tablet by mouth at bedtime daily. Indications: nausea and vomiting caused by cancer drugs    ondansetron HCL (ZOFRAN) 8 mg tablet Take one tablet by mouth every 8 hours as needed for Nausea or Vomiting. Avoid on Days 1-2 of each chemotherapy cycle.    prochlorperazine maleate (COMPAZINE) 10 mg tablet Take one tablet by mouth every 6 hours as needed for Nausea or Vomiting.    rosuvastatin (CRESTOR) 5 mg tablet Take one tablet by mouth daily.    tamoxifen (NOLVADEX) 20 mg tablet TAKE ONE TABLET BY MOUTH EVERY NIGHT AT BEDTIME    tirzepatide (MOUNJARO) 7.5 mg/0.5 mL injector PEN Inject 0.5 mL under the skin every 7 days. wednesday    traMADoL (ULTRAM) 50 mg tablet Take one tablet to two tablets by mouth every 6 hours as needed. Indications: pain     Vitals:    12/20/22 0912   BP: 108/72   BP Source: Arm, Left Upper   Pulse: 89   Temp: 36.4 ?C (97.5 ?F)   SpO2: 98%   TempSrc: Temporal   Weight: 129.1 kg (284 lb 9.6 oz)       Body mass index is 38.6 kg/m?Marland Kitchen             Fatigue Scale: 7    Pain Addressed:  N/A    Patient Evaluated for a Clinical Trial: Patient currently enrolled in a Latimer treatment clinical trial.     Eastern Cooperative Oncology Group performance status is 0, Fully active, able to carry on all pre-disease performance without restriction.Marland Kitchen     Physical Exam  Vitals reviewed.   Constitutional:       General: She is not in acute distress.     Appearance: Normal appearance. She is well-developed. She is not ill-appearing, toxic-appearing or diaphoretic.   HENT:      Head: Normocephalic and atraumatic.      Nose: Nose normal. No rhinorrhea.      Mouth/Throat:      Mouth: Mucous membranes are moist. Mucous membranes are not pale. No oral lesions.      Pharynx: Oropharynx is clear. No oropharyngeal exudate or posterior oropharyngeal erythema.      Tonsils: No tonsillar abscesses.   Eyes:      General: No scleral icterus.        Right eye: No discharge.         Left eye: No discharge.      Extraocular Movements: Extraocular movements intact.      Conjunctiva/sclera: Conjunctivae normal.      Pupils: Pupils are equal, round, and reactive to light.   Cardiovascular:      Rate and Rhythm: Normal rate and regular rhythm.      Pulses: Normal pulses.      Heart sounds: Normal heart sounds. No murmur heard.     No gallop.   Pulmonary:      Effort: Pulmonary effort is normal. No respiratory distress.      Breath sounds: Normal breath sounds. No stridor. No wheezing, rhonchi or rales.   Chest:      Chest wall: No tenderness.   Abdominal:      General: Abdomen is flat. A surgical scar is present. Bowel sounds are normal. There is no distension.  Palpations: Abdomen is soft. There is no mass.      Tenderness: There is abdominal tenderness (minimal with palpation). There is no guarding or rebound.      Hernia: No hernia is present.          Comments: Firm, nontender scar tissue palpated under surgical incision   Musculoskeletal:         General: No swelling, tenderness, deformity or signs of injury. Normal range of motion.      Cervical back: Normal range of motion and neck supple. No rigidity. No muscular tenderness.      Right lower leg: No edema.      Left lower leg: No edema.   Lymphadenopathy:      Cervical: No cervical adenopathy.   Skin:     General: Skin is warm and dry.      Coloration: Skin is not jaundiced or pale.      Findings: No bruising, erythema, lesion or rash.   Neurological:      General: No focal deficit present.      Mental Status: She is alert and oriented to person, place, and time. Mental status is at baseline.      Cranial Nerves: No cranial nerve deficit.      Motor: No weakness.      Coordination: Coordination normal.      Gait: Gait normal.   Psychiatric:         Mood and Affect: Mood normal.         Behavior: Behavior normal.         Thought Content: Thought content normal.         Judgment: Judgment normal.          07/23/2022  Pathology    Final Diagnosis:     A. 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      B. Colon, extended left hemi colectomy:    Adenocarcinoma moderately differentiated. See comment.   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 Additional proximal margin#1, excision:   Colon tissue with Tubular adenomas. See comment.   Three lymph nodes negative for metastatic carcinoma (0/3).     D. Additional proximal margins #2, excision:    Tubulovillous adenoma with focal high-grade dysplasia   Eleven lymph nodes negative for metastatic carcinoma (0/11).       Comment:   Pursuant to the Quality Assurance Program at the [facility] Pathology Department, selected slides from this case have been   concurrently reviewed by the following pathologist: Dr. Juanetta Beets   who   agrees with the final diagnosis.       CASE SUMMARY: (COLON AND RECTUM: Resection)   Standard(s): AJCC-UICC 8   CAP Version: Colon and Rectum Resection 4.3.0.0     SPECIMEN     Procedure   ___ Subtotal (extended) left abdominal colectomy     Macroscopic Evaluation of Mesorectum (required for rectal cancers)   ___ Not applicable     TUMOR     Tumor Site (select all that apply)   ___ Transverse colon/Splenic flexure     Histologic Type   ___ Adenocarcinoma     Histologic Grade ___ G2, moderately differentiated     Tumor Size   ___ Greatest dimension in Centimeters (cm): 4.0 x 3.8  x 0.7 cm     Multiple Primary Sites (e.g., hepatic flexure and transverse colon)   ___ Not applicable (no additional primary site(s) present)     Tumor Extent   ___ Invades through muscularis propria into the pericolonic or perirectal   tissue     Sub-mucosal Invasion (required only for pT1 tumors)   ___ Not applicable (not a pT1 tumor)     Macroscopic Tumor Perforation   ___ Not identified     Lymphatic and / or Vascular Invasion(select all that apply)   ___ Present (not otherwise specified)     Perineural Invasion   ___ Present     Tumor Budding Score   ___ Intermediate (5-9)     Treatment Effect   ___ No known presurgical therapy     MARGINS     Margin Status for Invasive Carcinoma   ___ All margins negative for invasive carcinoma   +Closest Margin(s) to Invasive Carcinoma (select all that apply)   ___ Radial (circumferential)/Mesenteric   +Distance from Invasive Carcinoma to Closest Margin   Specify in Centimeters (cm)   ___ Exact distance in cm: 3.4 cm   Distance from Invasive Carcinoma to Radial (Circumferential) Margin   (required for rectal   tumors)   ___ Distance already reported as closest margin.   +Distance from Invasive Carcinoma to Distal Margin (recommended for rectal   tumors)   Specify in Centimeters (cm)   ___ Exact distance in cm: 58.0 cm   Margin(s) Involved by Invasive Carcinoma (select all that apply)   ___ Not applicable     Margin Status for Non-Invasive Tumor (select all that apply)   ___ All margins negative for high-grade dysplasia / intramucosal carcinoma   and low-grade dysplasia     REGIONAL LYMPH NODES     Regional Lymph Node Status   ___ Regional lymph nodes present   ___ Tumor present in regional lymph nodes   Number of Lymph Nodes with Tumor   ___ Exact number (specify): 5   Number of Lymph Nodes Examined   ___ Exact number (specify): 34     Tumor Deposits   ___ Not identified DISTANT METASTASIS     Distant Site(s) Involved, if applicable (select all that apply)   ___ Not applicable     pTNM CLASSIFICATION (AJCC 8th Edition)   Reporting of pT, pN, and (when applicable) pM categories is based on   information available to the pathologist at the time the report is issued.   As per the AJCC (Chapter 1, 8th Ed.) it is the managing physician's   responsibility to establish the final pathologic stage based upon all   pertinent information, including but potentially not limited to this   pathology report.     Modified Classification (required only if applicable) (select all that   apply)   ___ Not applicable     pT Category   ___ pT3: Tumor invades through the muscularis propria into pericolorectal   tissues     T Suffix (required only if applicable)   ___ Not applicable     pN Category   pN2: Four or more regional nodes are positive   ___ pN2a: Four to six regional lymph nodes are positive     pM Category (required only if confirmed pathologically)   ___ Not applicable - pM cannot be determined from the submitted   specimen(s)     ADDITIONAL FINDINGS     +Additional Findings (select all that apply)  ___ Adenoma(s) (Tubulovillous adenoma with high grade dysplasia, 6.3 cm.   1.9 cm., rectosigmoid; tubular adenomas)      Latest Reference Range & Units 12/20/22 08:52   Hemoglobin 12.0 - 15.0 g/dL 16.1 (L)   Hematocrit 09.6 - 45.0 % 30.9 (L)   Platelet Count 150 - 400 10*3/uL 166   White Blood Cells 4.50 - 11.00 10*3/uL 6.50   Neutrophils 41 - 77 % 68   Absolute Neutrophil Count 1.80 - 7.00 10*3/uL 4.50   Lymphocytes 24 - 44 % 18 (L)   Absolute Lymph Count 1.00 - 4.80 10*3/uL 1.20   Monocytes 4 - 12 % 9   Absolute Monocyte Count 0.00 - 0.80 10*3/uL 0.60   Eosinophils 0 - 5 % 4   Absolute Eosinophil Count 0.00 - 0.45 10*3/uL 0.30   Absolute Basophil Count 0.00 - 0.20 10*3/uL 0.10   Basophils 0 - 2 % 1   RBC 4.00 - 5.00 10*6/uL 3.63 (L)   MCV 80.0 - 100.0 fL 85.1   MCH 26.0 - 34.0 pg 27.9   MCHC 32.0 - 36.0 g/dL 04.5   MPV 7.0 - 40.9 fL 7.1   RDW 11.0 - 15.0 % 18.8 (H)   Sodium 137 - 147 mmol/L 139   Potassium 3.5 - 5.1 mmol/L 4.1   Chloride 98 - 110 mmol/L 106   CO2 21 - 30 mmol/L 27   Anion Gap 3 - 12  6   Blood Urea Nitrogen 7 - 25 mg/dL 11   Creatinine 8.11 - 1.00 mg/dL 9.14 (H)   Glomerular Filtration Rate (GFR) >60 mL/min >60   Glucose 70 - 100 mg/dL 782 (H)   Albumin 3.5 - 5.0 g/dL 3.5   Calcium 8.5 - 95.6 mg/dL 8.9   Total Bilirubin 0.3 - 1.2 mg/dL 0.3   Total Protein 6.0 - 8.0 g/dL 6.8   AST (SGOT) 7 - 40 U/L 20   ALT (SGPT) 7 - 56 U/L 8   Alk Phosphatase 25 - 110 U/L 41   (L): Data is abnormally low  (H): Data is abnormally high    Assessment and Plan:    47 yr old female with history of right breast cancer (07/2019): ER/PR+, HER2- IDC pT1c N1a M0 (1 of 34 LN+) M0.  on  tamoxifen and zoladex. Who has newly diagnosed left colon adenocarcinoma s/P surgery: pT3N2 (LN 5/48) mod diff , positive for  Lymphatic and / or Vascular Invasion, and Perineural Invasion.   Here for consultation about adjuvant chemotherapy. Overall recovering from surgery reasonable well, only complaint is mild intermittent lightheadedness, no F/C/N/V/D, no pain, no SOB, no DOE.   Accompanied by her husband.     PS ECOG 0    Reviewed images and pathology with the pt and husband about her stage of disease, the risk of MRD/recurrence of disease, the rationale/mechanism of adjuvant treatment, risk vs benefit, regimen of FOLFOX (a schedule, timing, length), and potential side effects. (Info given and consent done)   Also discussed the surveillance   We discussed the study of ctDNA (C-14), the pt is interested in    Continues to have adjuvant chemo  Cont follow up with new labs, clinically doing well, tolerate the treatment well,  no F/C/N/V/D, noticed some urgent and discomfort in urination.     Lab reviewed- mild increasing Cr.     Plan:      Undergoing chemotherapy with reported side effects of nausea, vomiting, and diarrhea. Recent CT scan shows stable lung nodule  and resolving postoperative changes in the right abdomen. Tumor markers have been negative.  N/V improved with Ativan before and after chemotherapy and increased Decadron.  -Continue current this plan and C9 FOLFOX.  -Continue Decadron to two pills day 2 and 3, to help prevent nausea.  -Imodium at the end of chemo to prevent diarrhea.  -RTC in 2 weeks with labs and visit, then C10  -Reschedule treatment on 01/18/2023 to 01/25/2023 to avoid having the pump during Christmas.  -RTC 01/04/2023 with labs and visit with Dr. Wynelle Link for C10, and 01/25/2023 for C11. Last cycle tentatively 02/08/23  Renal Function improved. Continue current efforts to maintain oral hydration given IVF shortage.   Continue supportive measures.   Pt to call with any questions, concerns or symptoms at any time and we can see her sooner.           Turner Daniels, APRN-NP        The patient and family were allowed to ask questions and voice concerns; these were addressed to the best of our ability. They expressed understanding of what was explained to them, and they agreed with the present plan. RTC in 2 weeks with labs to see a provider. Patient has the phone numbers for the Cancer Center and was instructed on how to contact us with any questions or concerns. My collaborating physician on this case is Dr. Wynelle Link.

## 2022-12-20 ENCOUNTER — Encounter: Admit: 2022-12-20 | Discharge: 2022-12-20 | Payer: 59

## 2022-12-20 ENCOUNTER — Encounter: Admit: 2022-12-20 | Discharge: 2022-12-20 | Payer: PRIVATE HEALTH INSURANCE

## 2022-12-20 DIAGNOSIS — C186 Malignant neoplasm of descending colon: Secondary | ICD-10-CM

## 2022-12-20 DIAGNOSIS — K591 Functional diarrhea: Secondary | ICD-10-CM

## 2022-12-20 DIAGNOSIS — C50411 Malignant neoplasm of upper-outer quadrant of right female breast: Secondary | ICD-10-CM

## 2022-12-20 LAB — COMPREHENSIVE METABOLIC PANEL
~~LOC~~ BKR ALBUMIN: 3.5 g/dL (ref 3.5–5.0)
~~LOC~~ BKR BLD UREA NITROGEN: 11 mg/dL (ref 7–25)
~~LOC~~ BKR CHLORIDE: 106 mmol/L — ABNORMAL LOW (ref 98–110)
~~LOC~~ BKR GLOMERULAR FILTRATION RATE (GFR): >60 mL/min (ref >60–4.80)
~~LOC~~ BKR POTASSIUM: 4.1 mmol/L — ABNORMAL LOW (ref 3.5–5.1)
~~LOC~~ BKR TOTAL PROTEIN: 6.8 g/dL (ref 6.0–8.0)

## 2022-12-20 LAB — CBC AND DIFF
~~LOC~~ BKR ABSOLUTE BASO COUNT: 0.1 10*3/uL (ref 0.00–0.20)
~~LOC~~ BKR ABSOLUTE EOS COUNT: 0.3 10*3/uL (ref 0.00–0.45)
~~LOC~~ BKR ABSOLUTE MONO COUNT: 0.6 10*3/uL (ref 0.00–0.80)
~~LOC~~ BKR MCHC: 32 g/dL — ABNORMAL HIGH (ref 32.0–36.0)
~~LOC~~ BKR MCV: 85 fL — ABNORMAL HIGH (ref 80.0–100.0)
~~LOC~~ BKR MPV: 7.1 fL (ref 7.0–11.0)
~~LOC~~ BKR RBC COUNT: 3.6 10*6/uL — ABNORMAL LOW (ref 4.00–5.00)
~~LOC~~ BKR RDW: 18 % — ABNORMAL HIGH (ref 11.0–15.0)
~~LOC~~ BKR WBC COUNT: 6.5 10*3/uL (ref 4.50–11.00)

## 2022-12-20 LAB — CEA(CARCINOEMBRYONIC AG): ~~LOC~~ BKR CEA: 0.6 ng/mL (ref <3.0)

## 2022-12-20 MED ORDER — LOPERAMIDE 2 MG PO CAP
2 mg | Freq: Once | ORAL | 0 refills | Status: CP
Start: 2022-12-20 — End: ?
  Administered 2022-12-20: 19:00:00 2 mg via ORAL

## 2022-12-20 MED ORDER — PALONOSETRON 0.25 MG/5 ML IV SOLN
.25 mg | Freq: Once | INTRAVENOUS | 0 refills | Status: CP
Start: 2022-12-20 — End: ?
  Administered 2022-12-20: 16:00:00 0.25 mg via INTRAVENOUS

## 2022-12-20 MED ORDER — DEXAMETHASONE 4 MG PO TAB
8 mg | Freq: Once | ORAL | 0 refills | Status: CP
Start: 2022-12-20 — End: ?
  Administered 2022-12-20: 16:00:00 8 mg via ORAL

## 2022-12-20 MED ORDER — LORAZEPAM 0.5 MG PO TAB
.5 mg | Freq: Once | ORAL | 0 refills | Status: CP
Start: 2022-12-20 — End: ?
  Administered 2022-12-20: 16:00:00 0.5 mg via ORAL

## 2022-12-20 MED ORDER — OXALIPLATIN IVPB
85 mg/m2 | Freq: Once | INTRAVENOUS | 0 refills | Status: CP
Start: 2022-12-20 — End: ?
  Administered 2022-12-20 (×2): 187.85 mg via INTRAVENOUS

## 2022-12-20 MED ORDER — LIDOCAINE (PF) 10 MG/ML (1 %) IJ SOLN
2 mL | Freq: Once | SUBCUTANEOUS | 0 refills | Status: CP
Start: 2022-12-20 — End: ?
  Administered 2022-12-20: 16:00:00 2 mL via SUBCUTANEOUS

## 2022-12-20 MED ORDER — GOSERELIN 10.8 MG SC IMPL
10.8 mg | Freq: Once | SUBCUTANEOUS | 0 refills | Status: CP
Start: 2022-12-20 — End: ?
  Administered 2022-12-20: 16:00:00 10.8 mg via SUBCUTANEOUS

## 2022-12-20 MED ORDER — FLUOROURACIL IV AMB PUMP
2000 mg/m2 | Freq: Once | INTRAVENOUS | 0 refills | Status: CP
Start: 2022-12-20 — End: ?
  Administered 2022-12-20 (×2): 4420 mg via INTRAVENOUS

## 2022-12-20 NOTE — Progress Notes
.  [facility] Note  Verified consent signed and in chart.    Blood return positive via: Port (Single)    BSA and dose double checked (agree with orders as written) with: yes, see MAR    Labs/applicable tests checked: CBC and Comprehensive Metabolic Panel (CMP)    Treatment regimen: C9D1    oxaliplatin (ELOXATIN) 187.85 mg in dextrose 5% (D5W) 287.57 mL IVPB     fluorouraciL (ADRUCIL) 4,420 mg in sodium chloride 0.9% (NS) 92 mL IV amb pump       Rate verified and armband double check with second RN: yes    Patient education offered and stated understanding. Denies questions at this time.    Patient arrived to CC treatment after being seen in clinic with Della Goo, APRN; please refer to clinic note for assessment details. Premeds given per treatment plan. Zoladex given in the RLQ and tolerated without difficulty. Oxaliplatin given w/o complications, patient tolerated well. Port positive for blood return, flushed with saline, and currently infusing 5FU via ambulatory pump. All connections secure, unclamped, and face of pump reads infusing. Cadd unhook scheduled for 12/22/2022 at 2:45. Patient declined copy of labs and AVS. All questions and concerns addressed. Patient left CC treatment in stable condition.

## 2022-12-21 ENCOUNTER — Encounter: Admit: 2022-12-21 | Discharge: 2022-12-21 | Payer: 59

## 2022-12-21 ENCOUNTER — Encounter: Admit: 2022-12-21 | Discharge: 2022-12-21 | Payer: PRIVATE HEALTH INSURANCE

## 2022-12-21 MED ORDER — TAMOXIFEN 20 MG PO TAB
20 mg | ORAL_TABLET | Freq: Every evening | ORAL | 0 refills | Status: AC
Start: 2022-12-21 — End: ?

## 2022-12-22 ENCOUNTER — Encounter: Admit: 2022-12-22 | Discharge: 2022-12-22 | Payer: PRIVATE HEALTH INSURANCE

## 2022-12-22 ENCOUNTER — Encounter: Admit: 2022-12-22 | Discharge: 2022-12-22 | Payer: 59

## 2022-12-22 DIAGNOSIS — C186 Malignant neoplasm of descending colon: Secondary | ICD-10-CM

## 2022-12-22 DIAGNOSIS — K591 Functional diarrhea: Secondary | ICD-10-CM

## 2022-12-22 NOTE — Patient Instructions
[facility]  Discharge Instructions      Jennifer Durham  12/22/2022    Treatment Received Today:  Pump removal            Discharge Instructions  Call immediately to report the following:  Uncontrolled nausea or vomiting, pain, or bleeding  Temperature of 100.4 F or greater or any sign/symptom of infection (warmth, redness, tenderness)  Painful mouth or difficulty swallowing  Diarrhea   Swelling of arms or legs  Rash     Post-Treatment Directions:  Use mouth rinses after meals and at bedtime.  Use a non-alcohol commercial brand rinse   or mild salt water/baking soda rinse.    Drink 8-10 glasses of fluids daily.  Try to exercise daily to decrease fatigue.      Medication Instructions  If there are any specific medication instructions they are written below          Phone Numbers  Cancer Center Phone # [phone]    (Answered 24 hrs a day)      Return Appointments:

## 2022-12-22 NOTE — Progress Notes
Patient arrived to CC treatment for pump removal. Assessment completed on patient. See doc flow sheet for full assessment. 0mL left in pump. Pump removed. Port positive for blood return, flushed with saline, and then de-accessed. Patient left CC treatment in stable condition.

## 2022-12-28 ENCOUNTER — Encounter: Admit: 2022-12-28 | Discharge: 2022-12-28 | Payer: PRIVATE HEALTH INSURANCE

## 2023-01-02 ENCOUNTER — Encounter: Admit: 2023-01-02 | Discharge: 2023-01-02 | Payer: PRIVATE HEALTH INSURANCE

## 2023-01-04 ENCOUNTER — Encounter: Admit: 2023-01-04 | Discharge: 2023-01-04 | Payer: 59

## 2023-01-04 ENCOUNTER — Encounter: Admit: 2023-01-04 | Discharge: 2023-01-04 | Payer: PRIVATE HEALTH INSURANCE

## 2023-01-04 DIAGNOSIS — K591 Functional diarrhea: Secondary | ICD-10-CM

## 2023-01-04 DIAGNOSIS — C50411 Malignant neoplasm of upper-outer quadrant of right female breast: Secondary | ICD-10-CM

## 2023-01-04 DIAGNOSIS — C186 Malignant neoplasm of descending colon: Secondary | ICD-10-CM

## 2023-01-04 LAB — COMPREHENSIVE METABOLIC PANEL
~~LOC~~ BKR CALCIUM: 9.5 mg/dL — ABNORMAL HIGH (ref 8.5–10.6)
~~LOC~~ BKR CREATININE: 1 mg/dL — ABNORMAL HIGH (ref 0.40–1.00)
~~LOC~~ BKR TOTAL PROTEIN: 7.2 g/dL (ref 6.0–8.0)

## 2023-01-04 LAB — CBC AND DIFF
~~LOC~~ BKR ABSOLUTE BASO COUNT: 0.1 10*3/uL (ref 0.00–0.20)
~~LOC~~ BKR ABSOLUTE EOS COUNT: 0.2 10*3/uL (ref 0.00–0.45)
~~LOC~~ BKR ABSOLUTE LYMPH COUNT: 1.1 10*3/uL (ref 1.00–4.80)
~~LOC~~ BKR ABSOLUTE MONO COUNT: 0.4 10*3/uL (ref 0.00–0.80)
~~LOC~~ BKR ABSOLUTE NEUTROPHIL: 3.3 10*3/uL (ref 1.80–7.00)
~~LOC~~ BKR BASOPHILS %: 1 % (ref 0–2)
~~LOC~~ BKR EOSINOPHILS %: 5 % (ref 0–5)
~~LOC~~ BKR HEMATOCRIT: 30 % — ABNORMAL LOW (ref 36.0–45.0)
~~LOC~~ BKR HEMOGLOBIN: 10 g/dL — ABNORMAL LOW (ref 12.0–15.0)
~~LOC~~ BKR LYMPHOCYTES %: 21 % — ABNORMAL LOW (ref 24–44)
~~LOC~~ BKR MCH: 28 pg (ref 26.0–34.0)
~~LOC~~ BKR MCV: 85 fL — ABNORMAL HIGH (ref 80.0–100.0)
~~LOC~~ BKR MONOCYTES %: 9 % (ref 4–12)
~~LOC~~ BKR MPV: 6.9 fL — ABNORMAL LOW (ref 7.0–11.0)
~~LOC~~ BKR NEUTROPHILS %: 65 % (ref 41–77)
~~LOC~~ BKR RBC COUNT: 3.5 10*6/uL — ABNORMAL LOW (ref 4.00–5.00)
~~LOC~~ BKR WBC COUNT: 5.1 10*3/uL (ref 4.50–11.00)

## 2023-01-04 LAB — CEA(CARCINOEMBRYONIC AG): ~~LOC~~ BKR CEA: 0.5 ng/mL (ref <3.0)

## 2023-01-04 MED ORDER — FLUOROURACIL IV AMB PUMP
2000 mg/m2 | Freq: Once | INTRAVENOUS | 0 refills | Status: CP
Start: 2023-01-04 — End: ?
  Administered 2023-01-04 (×3): 4420 mg via INTRAVENOUS

## 2023-01-04 MED ORDER — LIDOCAINE (PF) 10 MG/ML (1 %) IJ SOLN
2 mL | Freq: Once | SUBCUTANEOUS | 0 refills
Start: 2023-01-04 — End: ?

## 2023-01-04 MED ORDER — LOPERAMIDE 2 MG PO CAP
2 mg | Freq: Once | ORAL | 0 refills | Status: CP
Start: 2023-01-04 — End: ?
  Administered 2023-01-04: 20:00:00 2 mg via ORAL

## 2023-01-04 MED ORDER — GOSERELIN 10.8 MG SC IMPL
10.8 mg | Freq: Once | SUBCUTANEOUS | 0 refills
Start: 2023-01-04 — End: ?

## 2023-01-04 MED ORDER — LORAZEPAM 0.5 MG PO TAB
.5 mg | Freq: Once | ORAL | 0 refills | Status: CP
Start: 2023-01-04 — End: ?
  Administered 2023-01-04: 17:00:00 0.5 mg via ORAL

## 2023-01-04 MED ORDER — OXALIPLATIN IVPB
85 mg/m2 | Freq: Once | INTRAVENOUS | 0 refills | Status: CP
Start: 2023-01-04 — End: ?
  Administered 2023-01-04 (×2): 187.85 mg via INTRAVENOUS

## 2023-01-04 MED ORDER — PALONOSETRON 0.25 MG/5 ML IV SOLN
.25 mg | Freq: Once | INTRAVENOUS | 0 refills | Status: CP
Start: 2023-01-04 — End: ?
  Administered 2023-01-04: 17:00:00 0.25 mg via INTRAVENOUS

## 2023-01-04 MED ORDER — DEXAMETHASONE 4 MG PO TAB
8 mg | Freq: Once | ORAL | 0 refills | Status: CP
Start: 2023-01-04 — End: ?
  Administered 2023-01-04: 17:00:00 8 mg via ORAL

## 2023-01-04 NOTE — Patient Instructions
Call Immediately to report the following:  Unexplained bleeding or bleeding that will not stop  Difficulty swallowing  Shortness of breath, wheezing, or trouble breathing  Rapid, irregular heartbeat; chest pain  Dizziness, lightheadedness  Rash or cut that swells or turns red, feels hot or painful, or begin to ooze  Diarrhea   Uncontrolled nausea or vomiting  Fever of 100.4 F or higher, or chills    Important Phone Numbers:  Cancer Center Main Number (answered 24 hours a day) [phone]  Cancer Center Scheduling (appointments) [phone]  Social Worker [phone]  Nutritionist [phone]

## 2023-01-04 NOTE — Progress Notes
Name: Jennifer Durham          MRN: [id number]      DOB: Sep 15, 1975      AGE: 47 y.o.   DATE OF SERVICE: 01/04/2023    Subjective:             Reason for Visit:  Follow Up      Jennifer Durham is a 47 y.o. female.      Cancer Staging   Colon cancer [facility])  Staging form: Colon And Rectum, AJCC 8th Edition  - Clinical stage from 07/23/2022: Stage IIIB (cT3, cN2a, cM0) - Signed by Miguel Rota, MD on 08/11/2022    Malignant neoplasm of upper-outer quadrant of right breast in female, estrogen receptor positive (HCC)  Staging form: Breast, AJCC 8th Edition  - Clinical stage from 07/04/2019: Stage IA (cT1c, cN0(f), cM0, G2, ER+, PR+, HER2-) - Signed by Guy Begin, PA-C on 07/12/2019  - Pathologic stage from 08/21/2019: Stage IA (pT1c, pN1a, cM0, G2, ER+, PR+, HER2-) - Signed by Guy Begin, PA-C on 08/30/2019      History of Present Illness    47 y.o. female with hx of stage IA ER/PR+, HER2- IDC of the right breast diagnosed in 06/2019 after detecting dimpling of her breast in 05/2019. She presented for mammogram which identified a spiculated mass in the upper right medial breast. She underwent ultrasound and biopsy that confirmed carcinoma . Pathology review at [facility] confirmed grade 2 IDC with a focal lobular growth pattern and lymphoid tissue with no evidence of carcinoma in the right axillary specimen. She was recommended to undergo upfront surgery. On 08/01/2019, she underwent bilateral skin sparing mastectomy and right sentinel lymph node biopsy, axilla lymph node dissection, and reconstruction.  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  A 5 mm focus of metastatic carcinoma was found in 1 of 4 sentinel lymph nodes.  0 of 27 additional axillary nodes were involved. pT1c N1a M0 (1 of 34 LN+) M0. No LVI, No EIC, No ENE. She was recommended to receive adjuvant chemotherapy with TC x4 cycles,  underwent adjuvant radiotherapy to her right reconstructed breast, chest wall and regional nodes to a dose of 4526 cGy in 16 fractions from 02/19/2020 - 03/11/2020.      She is on tamoxifen and zoladex.     history of hypertension, polycystic ovarian disease, and prediabetesa colonoscopy     She had a colonoscopy performed on May 29th, 2024. The colonoscopy revealed a moderately differentiated adenocarcinoma suspected to be at the splenic flexure, 60 cm from the anal verge, and a large rectosigmoid polyp described as pedunculated. The polyp was biopsied but not removed and was identified as a tubulovillous adenoma. CT of the chest, abdomen, and pelvis, which showed no evidence of metastatic disease. The patient's CEA level was 0.8, and she was found to be microsatellite stable.     07/23/2022  Extended left hemi colectomy: pT3N2 (LN 5/48) mod diff adenocarcinoma, Lymphatic and / or Vascular Invasion: Present; Perineural Invasion--Present     Interval History,  01/04/2023  Continues to have adjuvant chemo FOLFOX (so far 9 treatments   follow up with new labs, clinically doing well, tolerate the treatment well,  no F/C/N/V/D,  < G1 peripheral neuropathy      Review of Systems 12 system reviewed negative except above stated    Mother had polyps  2 siblings: age 39, 15; 2  biologic children: 55, and 35 yrs old     Objective:          carvediloL (COREG) 6.25 mg tablet Take one tablet by mouth twice daily with meals. Take with food.    CHOLEcalciferoL (vitamin D3) (OPTIMAL D3) 50,000 units capsule Take one capsule by mouth every 7 days. 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    escitalopram oxalate (LEXAPRO) 10 mg tablet TAKE ONE TABLET BY MOUTH EVERY DAY    ferrous sulfate (IRON) 325 mg (65 mg iron) tablet Take one tablet by mouth daily. Take on an empty stomach at least 1 hour before or 2 hours after food.    INTRAUTERINE DEVICE (IUD) IU by Intrauterine route.    levothyroxine (SYNTHROID) 50 mcg tablet Take one tablet by mouth daily.    lidocaine/prilocaine (EMLA) 2.5/2.5 % topical cream Apply pea sized amount of cream in a thick layer over port site 45 minutes prior to access, cover with transparent dressing    loperamide (IMODIUM A-D) 2 mg capsule TAKE ONE CAPSULE BY MOUTH THREE TIMES DAILY, 30-45 MINUTES prior to meals    LORazepam (ATIVAN) 0.5 mg tablet Take 1 tab by mouth every 6 hrs as needed N/V not controlled by Zofran or Compazine. May also use every 6 hrs as needed anxiety or at bedtime insomnia.    losartan (COZAAR) 25 mg tablet Take one-half tablet by mouth daily.    metFORMIN (GLUCOPHAGE) 1,000 mg tablet Take one tablet by mouth daily with breakfast.    montelukast (SINGULAIR) 10 mg tablet Take one tablet by mouth at bedtime daily.    OLANZapine (ZYPREXA) 5 mg tablet Take one tablet by mouth at bedtime daily. Indications: nausea and vomiting caused by cancer drugs    ondansetron HCL (ZOFRAN) 8 mg tablet Take one tablet by mouth every 8 hours as needed for Nausea or Vomiting. Avoid on Days 1-2 of each chemotherapy cycle.    prochlorperazine maleate (COMPAZINE) 10 mg tablet Take one tablet by mouth every 6 hours as needed for Nausea or Vomiting.    rosuvastatin (CRESTOR) 5 mg tablet Take one tablet by mouth daily.    tamoxifen (NOLVADEX) 20 mg tablet TAKE ONE TABLET BY MOUTH EVERY NIGHT AT BEDTIME    tirzepatide (MOUNJARO) 7.5 mg/0.5 mL injector PEN Inject 0.5 mL under the skin every 7 days. wednesday    traMADoL (ULTRAM) 50 mg tablet Take one tablet to two tablets by mouth every 6 hours as needed. Indications: pain     Vitals:    01/04/23 0903   BP: 94/67   Pulse: 93   Temp: 36.5 ?C (97.7 ?F)   Resp: 16   SpO2: 97%   TempSrc: Oral   PainSc: Zero   Weight: 129.8 kg (286 lb 2.5 oz)     Body mass index is 38.81 kg/m?Marland Kitchen     Pain Score: Zero       Fatigue Scale: 6    Pain Addressed:  N/A    Patient Evaluated for a Clinical Trial: Patient currently in screening for a treatment clinical trial.     Eastern Cooperative Oncology Group performance status is 0, Fully active, able to carry on all pre-disease performance without restriction.Marland Kitchen     Physical Exam  Constitutional:  Appearance: Normal appearance.   HENT:      Mouth/Throat:      Mouth: Mucous membranes are moist.   Eyes:      Extraocular Movements: Extraocular movements intact.      Conjunctiva/sclera: Conjunctivae normal.      Pupils: Pupils are equal, round, and reactive to light.   Cardiovascular:      Rate and Rhythm: Normal rate and regular rhythm.      Pulses: Normal pulses.      Heart sounds: Normal heart sounds.   Pulmonary:      Effort: Pulmonary effort is normal.      Breath sounds: Normal breath sounds.   Abdominal:      General: Abdomen is flat. There is no distension.      Palpations: Abdomen is soft.      Tenderness: There is no abdominal tenderness.   Musculoskeletal:      Cervical back: Normal range of motion and neck supple.   Skin:     General: Skin is warm.   Neurological:      General: No focal deficit present.      Mental Status: She is alert and oriented to person, place, and time.   Psychiatric:         Mood and Affect: Mood normal.         Behavior: Behavior normal.          07/23/2022  Pathology    Final Diagnosis:     A. 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      B. Colon, extended left hemi colectomy:    Adenocarcinoma moderately differentiated. See comment.   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 Additional proximal margin#1, excision:   Colon tissue with Tubular adenomas. See comment.   Three lymph nodes negative for metastatic carcinoma (0/3).     D. Additional proximal margins #2, excision:    Tubulovillous adenoma with focal high-grade dysplasia   Eleven lymph nodes negative for metastatic carcinoma (0/11).       Comment:   Pursuant to the Quality Assurance Program at the [facility] Pathology Department, selected slides from this case have been   concurrently reviewed by the following pathologist: Dr. Juanetta Beets   who   agrees with the final diagnosis.       CASE SUMMARY: (COLON AND RECTUM: Resection)   Standard(s): AJCC-UICC 8   CAP Version: Colon and Rectum Resection 4.3.0.0     SPECIMEN     Procedure   ___ Subtotal (extended) left abdominal colectomy     Macroscopic Evaluation of Mesorectum (required for rectal cancers)   ___ Not applicable     TUMOR     Tumor Site (select all that apply)   ___ Transverse colon/Splenic flexure     Histologic Type   ___ Adenocarcinoma     Histologic Grade   ___ G2, moderately differentiated     Tumor Size   ___ Greatest dimension in Centimeters (cm): 4.0 x 3.8 x 0.7 cm     Multiple Primary Sites (e.g., hepatic flexure and transverse colon)   ___ Not applicable (no additional primary site(s) present)     Tumor Extent   ___ Invades through muscularis propria into the pericolonic or perirectal   tissue  Sub-mucosal Invasion (required only for pT1 tumors)   ___ Not applicable (not a pT1 tumor)     Macroscopic Tumor Perforation   ___ Not identified     Lymphatic and / or Vascular Invasion(select all that apply)   ___ Present (not otherwise specified)     Perineural Invasion   ___ Present     Tumor Budding Score   ___ Intermediate (5-9)     Treatment Effect   ___ No known presurgical therapy     MARGINS     Margin Status for Invasive Carcinoma   ___ All margins negative for invasive carcinoma   +Closest Margin(s) to Invasive Carcinoma (select all that apply)   ___ Radial (circumferential)/Mesenteric   +Distance from Invasive Carcinoma to Closest Margin   Specify in Centimeters (cm)   ___ Exact distance in cm: 3.4 cm Distance from Invasive Carcinoma to Radial (Circumferential) Margin   (required for rectal   tumors)   ___ Distance already reported as closest margin.   +Distance from Invasive Carcinoma to Distal Margin (recommended for rectal   tumors)   Specify in Centimeters (cm)   ___ Exact distance in cm: 58.0 cm   Margin(s) Involved by Invasive Carcinoma (select all that apply)   ___ Not applicable     Margin Status for Non-Invasive Tumor (select all that apply)   ___ All margins negative for high-grade dysplasia / intramucosal carcinoma   and low-grade dysplasia     REGIONAL LYMPH NODES     Regional Lymph Node Status   ___ Regional lymph nodes present   ___ Tumor present in regional lymph nodes   Number of Lymph Nodes with Tumor   ___ Exact number (specify): 5   Number of Lymph Nodes Examined   ___ Exact number (specify): 34     Tumor Deposits   ___ Not identified     DISTANT METASTASIS     Distant Site(s) Involved, if applicable (select all that apply)   ___ Not applicable     pTNM CLASSIFICATION (AJCC 8th Edition)   Reporting of pT, pN, and (when applicable) pM categories is based on   information available to the pathologist at the time the report is issued.   As per the AJCC (Chapter 1, 8th Ed.) it is the managing physician's   responsibility to establish the final pathologic stage based upon all   pertinent information, including but potentially not limited to this   pathology report.     Modified Classification (required only if applicable) (select all that   apply)   ___ Not applicable     pT Category   ___ pT3: Tumor invades through the muscularis propria into pericolorectal   tissues     T Suffix (required only if applicable)   ___ Not applicable     pN Category   pN2: Four or more regional nodes are positive   ___ pN2a: Four to six regional lymph nodes are positive     pM Category (required only if confirmed pathologically)   ___ Not applicable - pM cannot be determined from the submitted   specimen(s) ADDITIONAL FINDINGS     +Additional Findings (select all that apply)   ___ Adenoma(s) (Tubulovillous adenoma with high grade dysplasia, 6.3 cm.   1.9 cm., rectosigmoid; tubular adenomas)      Latest Reference Range & Units 01/04/23 08:43   Hemoglobin 12.0 - 15.0 g/dL 16.1 (L)   Hematocrit 09.6 - 45.0 % 30.4 (L)   Platelet Count 150 -  400 10*3/uL 204   White Blood Cells 4.50 - 11.00 10*3/uL 5.10   Neutrophils 41 - 77 % 65   Absolute Neutrophil Count 1.80 - 7.00 10*3/uL 3.30   Lymphocytes 24 - 44 % 21 (L)   Absolute Lymph Count 1.00 - 4.80 10*3/uL 1.10   Monocytes 4 - 12 % 9   Absolute Monocyte Count 0.00 - 0.80 10*3/uL 0.40   Eosinophils 0 - 5 % 5   Absolute Eosinophil Count 0.00 - 0.45 10*3/uL 0.20   Absolute Basophil Count 0.00 - 0.20 10*3/uL 0.10   Basophils 0 - 2 % 1   RBC 4.00 - 5.00 10*6/uL 3.55 (L)   MCV 80.0 - 100.0 fL 85.5   MCH 26.0 - 34.0 pg 28.5   MCHC 32.0 - 36.0 g/dL 54.0   MPV 7.0 - 98.1 fL 6.9 (L)   RDW 11.0 - 15.0 % 18.0 (H)   Sodium 137 - 147 mmol/L 140   Potassium 3.5 - 5.1 mmol/L 4.0   Chloride 98 - 110 mmol/L 105   CO2 21 - 30 mmol/L 28   Anion Gap 3 - 12  7   Blood Urea Nitrogen 7 - 25 mg/dL 14   Creatinine 1.91 - 1.00 mg/dL 4.78 (H)   Glomerular Filtration Rate (GFR) >60 mL/min >60   Glucose 70 - 100 mg/dL 295 (H)   Albumin 3.5 - 5.0 g/dL 3.7   Calcium 8.5 - 62.1 mg/dL 9.5   Total Bilirubin 0.2 - 1.3 mg/dL 0.4   Total Protein 6.0 - 8.0 g/dL 7.2   AST (SGOT) 7 - 40 U/L 24   ALT (SGPT) 7 - 56 U/L 10   Alk Phosphatase 25 - 110 U/L 43   (L): Data is abnormally low  (H): Data is abnormally high    Assessment and Plan:    47 yr old female with history of right breast cancer (07/2019): ER/PR+, HER2- IDC pT1c N1a M0 (1 of 34 LN+) M0.  on  tamoxifen and zoladex. Who has newly diagnosed left colon adenocarcinoma s/P surgery: pT3N2 (LN 5/48) mod diff , positive for  Lymphatic and / or Vascular Invasion, and Perineural Invasion.     We discussed the study of ctDNA (C-14),  on it now    01/04/2023  Continues to have adjuvant chemo FOLFOX (so far 9 treatments   follow up with new labs, clinically doing well, tolerate the treatment well,  no F/C/N/V/D,  < G1 peripheral neuropathy    Lab reviewed- no issues    -- cont  mFOLFOX (cycle 9)  --encourging PO hydration  -- continue follow breast cancer care team  --surveillance images post adjuvant treatment  -- following C-14 protocol  -- follow with surgery team  -- call us for issues      The patient had a chance to ask questions, all been answered to her statisfaction.       Miguel Rota, MD, Jer[facility] Ivory

## 2023-01-04 NOTE — Progress Notes
Name: Jennifer Durham          MRN: [id number]      DOB: 1975/05/24      AGE: 47 y.o.   DATE OF SERVICE: 01/04/2023    Subjective:             Reason for Visit:  Follow Up      Jennifer Durham is a 47 y.o. female.      Cancer Staging   Colon cancer [facility])  Staging form: Colon And Rectum, AJCC 8th Edition  - Clinical stage from 07/23/2022: Stage IIIB (cT3, cN2a, cM0) - Signed by Miguel Rota, MD on 08/11/2022    Malignant neoplasm of upper-outer quadrant of right breast in female, estrogen receptor positive (HCC)  Staging form: Breast, AJCC 8th Edition  - Clinical stage from 07/04/2019: Stage IA (cT1c, cN0(f), cM0, G2, ER+, PR+, HER2-) - Signed by Guy Begin, PA-C on 07/12/2019  - Pathologic stage from 08/21/2019: Stage IA (pT1c, pN1a, cM0, G2, ER+, PR+, HER2-) - Signed by Guy Begin, PA-C on 08/30/2019      History of Present Illness    DIAGNOSIS: right breast cancer   PAST ONCOLOGY HISTORY: Jennifer Durham is a 47 year old pre menopausal female who noted right breast dimpling in May 2021. She was sent for diagnostic imaging at an outside facility on 07/04/19. At that time a new right breast mass was seen as well as a lymph node with cortical thickening and biopsy was recommended. Right breast biopsy 07/04/2019 [name]) showed invasive carcinoma with mixed ductal and lobular features, grade II, ER 91-100%, PR 91-100%, HER2 1+ IHC. Right axilla biopsy showed fragments of polymorphous lymphoid tissue and no malignancy seen. Jennifer Durham underwent right modified radical mastectomy on 08/21/2019.  Tumor size was 1.8 cm, invasive ductal carcinoma.  Grade 2.  1 out of 34 lymph nodes was positive.  The size of LN metastasis was 5 mm.  ER was 96%, PR 99%,, HER-2 0+ by IHC.  Oncotype DX score 11. We recommended adjuvant adjuvant TC every 3 weeks x4 started 09/26/2019. She completed 4 cycles 11/28/2019.      BREAST IMAGING:  Mammogram:    -- Bilateral diagnostic mammogram 07/04/19 [name]) revealed heterogeneously dense breast tissue. Spiculated mass with surrounding architectural distortion measuring up to 2 cm in the upper right medial breast at 10-11:00, 7.7 cm posterior to the nipple. Slightly prominent asymmetric right axillary lymph nodes.  -- Right diagnostic mammogram 07/16/19 ([facility]) revealed an the outside right MLO view from 07/04/2019, there was a small mass in the probable 9:00 position of the right breast at posterior depth, 2 cm inferior and 2 cm posterior to the known malignancy. Additional mammographic images will be performed to determine location. 2-D and 3-D images of the right breast were obtained.   In the 9:30 to 10:00 position of the right breast at middle to posterior depth, there was a 1.7 x 1.9 cm spiculated mass with internal tissue marker clip corresponding to biopsy-proven   malignancy. The biopsy-proven malignancy is approximately 8 cm posterior to the right nipple. In the 8:30 to 9:00 position of the right breast at posterior depth, there was an 0.8 cm lobular equal density mass which was not definitively seen on 2-D mammogram images from 03/21/2013.      Ultrasound:    -- Right breast ultrasound 07/04/19 [facility]) revealed hypoechoic spiculated mass with surrounding architectural distortion with in the right upper breast at 10:00, 6 cm FTN. Significant posterior acoustic shadowing.  Overall extent 1.3 x 1.7 x 1.6 cm. Increased cortical mantle thickening of a right axillary lymph node measuring up to 4 mm. BI-RADS 5.   -- Targeted right breast ultrasound 07/16/19 ([place]) revealed at 9:30, 9 cm from the nipple, demonstrated a 1.4 x 1.8 x 1.6 cm irregular hypoechoic mass with internal tissue marker clip corresponding to the biopsy-proven malignancy. In the right breast at 9:00, 12 cm from the nipple, there ws an 0.8 cm morphologically normal small  intramammary lymph node corresponding to the additional mammographic mass identified. No suspicious right axillary lymph nodes were seen. 3 morphologically normal right axillary lymph nodes were identified. The lymph node labeled #3, likely contains the internal tissue marker clip from outside biopsy.     Jennifer Durham underwent right modified radical mastectomy on 08/21/2019.  Tumor size was 1.8 cm, invasive ductal carcinoma.  Grade 2.  1 out of 34 lymph nodes was positive.  The size of LN metastasis was 5 mm.  ER was 96%, PR 99%,, HER-2 0+ by IHC. Oncotype Dx RS 11.   She completed 4 cycles of adjuvant TC 11/28/2019.    She completed radiation with Dr Isabella Stalling. She is enrolled on the MA39 trail.     She completed radiation with Dr Isabella Stalling 03/11/2020.            OB/GYN HISTORY: Age at onset of menstruation 13. G2P2. She had her first child at age 52. LMP 03/2016 due to Mirena placement. Uterus and ovaries intact.     PRESENT THERAPY: Tamoxifen 20mg  daily started 12/2019 + Zoladex every 3 months    Jennifer Durham is here today for continued follow up.        Review of Systems   Constitutional:  Positive for fatigue.        + hot flashes    Genitourinary:         Pre-menopausal   LMP 03/2016  Zoladex   Uterus and ovaries intact    Psychiatric/Behavioral:  The patient is nervous/anxious.        Allergies   Allergen Reactions    Tegaderm RASH       Objective:          carvediloL (COREG) 6.25 mg tablet Take one tablet by mouth twice daily with meals. Take with food.    CHOLEcalciferoL (vitamin D3) (OPTIMAL D3) 50,000 units capsule Take one capsule by mouth every 7 days. 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    escitalopram oxalate (LEXAPRO) 10 mg tablet TAKE ONE TABLET BY MOUTH EVERY DAY    ferrous sulfate (IRON) 325 mg (65 mg iron) tablet Take one tablet by mouth daily. Take on an empty stomach at least 1 hour before or 2 hours after food.    INTRAUTERINE DEVICE (IUD) IU by Intrauterine route.    levothyroxine (SYNTHROID) 50 mcg tablet Take one tablet by mouth daily.    lidocaine/prilocaine (EMLA) 2.5/2.5 % topical cream Apply pea sized amount of cream in a thick layer over port site 45 minutes prior to access, cover with transparent dressing    loperamide (IMODIUM A-D) 2 mg capsule TAKE ONE CAPSULE BY MOUTH THREE TIMES DAILY, 30-45 MINUTES prior to meals    LORazepam (ATIVAN) 0.5 mg tablet Take 1 tab by  mouth every 6 hrs as needed N/V not controlled by Zofran or Compazine. May also use every 6 hrs as needed anxiety or at bedtime insomnia.    losartan (COZAAR) 25 mg tablet Take one-half tablet by mouth daily.    metFORMIN (GLUCOPHAGE) 1,000 mg tablet Take one tablet by mouth daily with breakfast.    montelukast (SINGULAIR) 10 mg tablet Take one tablet by mouth at bedtime daily.    OLANZapine (ZYPREXA) 5 mg tablet Take one tablet by mouth at bedtime daily. Indications: nausea and vomiting caused by cancer drugs    ondansetron HCL (ZOFRAN) 8 mg tablet Take one tablet by mouth every 8 hours as needed for Nausea or Vomiting. Avoid on Days 1-2 of each chemotherapy cycle.    prochlorperazine maleate (COMPAZINE) 10 mg tablet Take one tablet by mouth every 6 hours as needed for Nausea or Vomiting.    rosuvastatin (CRESTOR) 5 mg tablet Take one tablet by mouth daily.    tamoxifen (NOLVADEX) 20 mg tablet TAKE ONE TABLET BY MOUTH EVERY NIGHT AT BEDTIME    tirzepatide (MOUNJARO) 7.5 mg/0.5 mL injector PEN Inject 0.5 mL under the skin every 7 days. wednesday    traMADoL (ULTRAM) 50 mg tablet Take one tablet to two tablets by mouth every 6 hours as needed. Indications: pain     Vitals:    01/04/23 0759   BP: 94/67   BP Source: Arm, Left Upper   Pulse: 93   Temp: 36.5 ?C (97.7 ?F)   Resp: 17   SpO2: 97%   TempSrc: Oral   PainSc: Zero   Weight: 129.8 kg (286 lb 3.2 oz)     Body mass index is 38.82 kg/m?Marland Kitchen     Pain Score: Zero       Fatigue Scale: 6    Pain Addressed:  N/A    Patient Evaluated for a Clinical Trial: No treatment clinical trial available for this patient.     [place] Cooperative Oncology Group performance status is 0, Fully active, able to carry on all pre-disease performance without restriction.Marland Kitchen     Physical Exam  Vitals and nursing note reviewed.   Pulmonary:      Effort: No respiratory distress.   Chest:   Breasts:     Right: No mass, skin change or tenderness.      Left: No mass, skin change or tenderness.          Comments: No palpable abnormalities on examination today.  Lymphadenopathy:      Cervical: No cervical adenopathy.      Upper Body:      Right upper body: No supraclavicular or axillary adenopathy.      Left upper body: No supraclavicular or axillary adenopathy.       LABS:  Estradiol 04/07/2020: <15.0  Estradiol 10/28/2020: <15.0  Estradiol 05/01/2021: <15.0  Estradiol 11/06/2021: 80.9          Assessment and Plan:    1. 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   2.  Jennifer Durham underwent right modified radical mastectomy on 08/21/2019.  Tumor size was 1.8 cm, invasive ductal carcinoma.  Grade 2.  1 out of 34 lymph nodes was positive.  The size of LN metastasis was 5 mm.  ER was 96%, PR 99%,, HER-2 0+ by IHC. Oncotype Dx RS 11.   3.  Since Jennifer Durham is premenopausal and has node positive disease,  we recommend adjuvant chemotherapy.  After discussing the options of AC-T chemotherapy versus TC chemotherapy, we decided to proceed with TC for four cycles.  We discussed the data based on ABC trials and breast cancer.  Clinique has significant history of heart disease in the family and is very concerned about Adriamycin causing heart damage.  Therefore TC seemed like better choice with equivalent efficacy based on trials.  She completed 4 cycles of TC 11/28/2019.    4. She completed radiation with Dr Isabella Stalling. She is enrolled on the MA39 trail.   5. Due to her cancer being ER+; we recommended endocrine therapy for 5-10 years. She started Tamoxifen 20mg  daily 12/2019. She was told the side effects including hot flashes, uterine cancer, blood clots and cataracts.  Continue Zoladex every 3 months, due 02/2023.  6. Hot flashes and mood changes; continue Lexapro 10mg .  7. Stage III colon cancer; currently recieving chemotherapy with Dr Wynelle Link.   8. RTC in 6 months for a breast cancer follow-up visit.     My collaborating MD Dr Raul Del.     Total Time Today was 30 minutes in the following activities: Preparing to see the patient, Obtaining and/or reviewing separately obtained history, Performing a medically appropriate examination and/or evaluation, Counseling and educating the patient/family/caregiver, Ordering medications, tests, or procedures, and Documenting clinical information in the electronic or other health record    Myrla Halsted, APRN-NP

## 2023-01-04 NOTE — Patient Instructions
 Dr. Miguel Rota and Lianne Moris, APRN-NP  Nurses: Lucillie Garfinkel, BSN, RN & Erenest Rasher, BSN, RN  Phone: [phone]   Fax: [phone]  Available Monday - Friday 8:00 am - 4:00 pm    SCHEDULING NEEDS: [phone]    On-call number for urgent needs outside of business hours: [phone]  Ask for the on-call Oncology fellow to be paged and they will call you back.    Medication refills: Please contact your pharmacy FIRST for medication refills; if they do not have any refills on file, they will contact the office. Make sure they have our correct contact information on file.  P: [phone], F: [phone]    MyChart Messages: All messages come to the nurse and we discuss with Dr. Wynelle Link and Lianne Moris, APRN. Please make sure to select Dr. Wynelle Link or Denny Peon when sending a message. Messages are answered 8:00 am - 4:00 pm Monday - Friday, holidays excluded. Please keep in mind we have 4 hours to respond to your messages.    Phone Calls: Our phone number is strictly a Camera operator. We check these messages all day and will get back with you. Please state your full name, date of birth, and reason for your call with as much detail as possible. Please keep in mind we have 4 hours to return your call.    FMLA/Insurance/Disability paperwork: Please fill out as much of the paperwork as you are able. We request that you give [place] 7-10 business days to complete this as we have a high volume of paperwork to complete for our patients.

## 2023-01-04 NOTE — Progress Notes
Cancer Therapeutics Note  Verified consent signed and in chart.    Blood return positive via: Port (Single, Power Port, and Accessed)    BSA and dose double checked (agree with orders as written) with: yes Audie Pinto, RN    Labs/applicable tests checked: CBC and Comprehensive Metabolic Panel (CMP)    Treatment regimen: C10D1    oxaliplatin (ELOXATIN) 187.85 mg in dextrose 5% (D5W) 287.57 mL IVPB     fluorouraciL (ADRUCIL) 4,420 mg in sodium chloride 0.9% (NS) 92 mL IV amb pump     Rate verified and armband double check with second RN: yes    Patient education offered and stated understanding. Denies questions at this time.    Patient arrived to CC treatment after being seen in clinic with Dr. Wynelle Link; please refer to clinic note for assessment details. Premeds given per treatment plan. Oxaliplatin given w/o complications, patient tolerated well. Port positive for blood return, flushed with saline, and currently infusing 5FU via ambulatory pump. All connections secure, unclamped, and face of pump reads infusing. Cadd unhook with fluids scheduled for 1345 on 12/12 at the [place] location. Patient declined copy of labs and AVS. All questions and concerns addressed. Patient left CC treatment in stable condition.

## 2023-01-05 ENCOUNTER — Encounter: Admit: 2023-01-05 | Discharge: 2023-01-05 | Payer: PRIVATE HEALTH INSURANCE

## 2023-01-06 ENCOUNTER — Encounter: Admit: 2023-01-06 | Discharge: 2023-01-06 | Payer: 59

## 2023-01-06 ENCOUNTER — Encounter: Admit: 2023-01-06 | Discharge: 2023-01-06 | Payer: PRIVATE HEALTH INSURANCE

## 2023-01-06 DIAGNOSIS — C186 Malignant neoplasm of descending colon: Secondary | ICD-10-CM

## 2023-01-06 DIAGNOSIS — K591 Functional diarrhea: Secondary | ICD-10-CM

## 2023-01-06 NOTE — Patient Instructions
[facility]  Discharge Instructions      Chasta Daun  01/06/2023    Discharge Instructions  Call immediately to report the following:  Uncontrolled nausea or vomiting, pain, or bleeding  Temperature of 100.4 F or greater or any sign/symptom of infection (warmth, redness, tenderness)  Painful mouth or difficulty swallowing  Diarrhea   Swelling of arms or legs  Rash     Post-Treatment Directions:  Use mouth rinses after meals and at bedtime.  Use a non-alcohol commercial brand rinse   or mild salt water/baking soda rinse.    Drink 8-10 glasses of fluids daily.  Try to exercise daily to decrease fatigue.      Medication Instructions  If there are any specific medication instructions they are written below    Phone Numbers  Cancer Center Phone # [phone]   (Answered 24 hrs a day)

## 2023-01-06 NOTE — Progress Notes
Pt presents for CADD unhook. Face of pump reads infusion complete. Pt denies any questions or concerns. Portacath +BR, flushed w/ 10mL NS, needle removed, covered w/ gauze and tape. Pt ambulated from infusion room in stable condition.

## 2023-01-11 ENCOUNTER — Encounter: Admit: 2023-01-11 | Discharge: 2023-01-11 | Payer: PRIVATE HEALTH INSURANCE

## 2023-01-17 ENCOUNTER — Encounter: Admit: 2023-01-17 | Discharge: 2023-01-17 | Payer: 59

## 2023-01-17 ENCOUNTER — Encounter: Admit: 2023-01-17 | Discharge: 2023-01-17 | Payer: PRIVATE HEALTH INSURANCE

## 2023-01-17 MED ORDER — ESCITALOPRAM OXALATE 10 MG PO TAB
ORAL_TABLET | 3 refills | Status: AC
Start: 2023-01-17 — End: ?

## 2023-01-17 NOTE — Telephone Encounter
Per office visit note from 12/2022, patient is to continue on this medication. Follow up visit with Dr. Milta Deiters office is appropriately scheduled. Will send in refill to pharmacy as requested.

## 2023-01-17 NOTE — Telephone Encounter
Drug-Drug: oxaliplatin and escitalopram oxalateThe concurrent use of oxaliplatin with agents that prolong the QTc interval may result in potentially life-threatening cardiac arrhythmias, including torsades de pointes.(1)

## 2023-01-19 ENCOUNTER — Encounter: Admit: 2023-01-19 | Discharge: 2023-01-19 | Payer: PRIVATE HEALTH INSURANCE

## 2023-01-21 ENCOUNTER — Encounter: Admit: 2023-01-21 | Discharge: 2023-01-21 | Payer: PRIVATE HEALTH INSURANCE

## 2023-01-24 ENCOUNTER — Encounter: Admit: 2023-01-24 | Discharge: 2023-01-24 | Payer: PRIVATE HEALTH INSURANCE

## 2023-01-24 NOTE — Progress Notes
Name: Jennifer Durham          MRN: [id number]      DOB: Mar 28, 1975      AGE: 47 y.o.   DATE OF SERVICE: 01/25/2023    Subjective:             Reason for Visit:  Follow Up      Jennifer Durham is a 47 y.o. female.      Cancer Staging   Colon cancer [facility])  Staging form: Colon And Rectum, AJCC 8th Edition  - Clinical stage from 07/23/2022: Stage IIIB (cT3, cN2a, cM0) - Signed by Miguel Rota, MD on 08/11/2022    Malignant neoplasm of upper-outer quadrant of right breast in female, estrogen receptor positive (HCC)  Staging form: Breast, AJCC 8th Edition  - Clinical stage from 07/04/2019: Stage IA (cT1c, cN0(f), cM0, G2, ER+, PR+, HER2-) - Signed by Guy Begin, PA-C on 07/12/2019  - Pathologic stage from 08/21/2019: Stage IA (pT1c, pN1a, cM0, G2, ER+, PR+, HER2-) - Signed by Guy Begin, PA-C on 08/30/2019      History of Present Illness    47 y.o. female with hx of stage IA ER/PR+, HER2- IDC of the right breast diagnosed in 06/2019 after detecting dimpling of her breast in 05/2019. She presented for mammogram which identified a spiculated mass in the upper right medial breast. She underwent ultrasound and biopsy that confirmed carcinoma . Pathology review at [facility] confirmed grade 2 IDC with a focal lobular growth pattern and lymphoid tissue with no evidence of carcinoma in the right axillary specimen. She was recommended to undergo upfront surgery. On 08/01/2019, she underwent bilateral skin sparing mastectomy and right sentinel lymph node biopsy, axilla lymph node dissection, and reconstruction.  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  A 5 mm focus of metastatic carcinoma was found in 1 of 4 sentinel lymph nodes.  0 of 27 additional axillary nodes were involved. pT1c N1a M0 (1 of 34 LN+) M0. No LVI, No EIC, No ENE. She was recommended to receive adjuvant chemotherapy with TC x4 cycles,  underwent adjuvant radiotherapy to her right reconstructed breast, chest wall and regional nodes to a dose of 4526 cGy in 16 fractions from 02/19/2020 - 03/11/2020.      She is on tamoxifen and zoladex.     history of hypertension, polycystic ovarian disease, and prediabetesa colonoscopy     She had a colonoscopy performed on May 29th, 2024. The colonoscopy revealed a moderately differentiated adenocarcinoma suspected to be at the splenic flexure, 60 cm from the anal verge, and a large rectosigmoid polyp described as pedunculated. The polyp was biopsied but not removed and was identified as a tubulovillous adenoma. CT of the chest, abdomen, and pelvis, which showed no evidence of metastatic disease. The patient's CEA level was 0.8, and she was found to be microsatellite stable.     07/23/2022  Extended left hemi colectomy: pT3N2 (LN 5/48) mod diff adenocarcinoma, Lymphatic and / or Vascular Invasion: Present; Perineural Invasion--Present     Interval History,     The patient presents for her next chemotherapy. 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 She describes the sensation as feeling 'thick,' but denies difficulty picking things up or dropping things. She notes some difficulty with fine motor tasks, such as fastening a necklace. She denies any neuropathy in her feet. The patient is currently on cycle eleven of chemotherapy  with oxaliplatin and 5FU. She reports feeling more tired recently due to increased activity since school's been out and having company visiting. She also reports some nausea, which is controlled with medication. Bowel movements are regular, with no constipation and occasional diarrhea.           Review of Systems   Constitutional:  Positive for fatigue. Negative for activity change, appetite change, chills, diaphoresis, fever and unexpected weight change.   HENT:  Negative for facial swelling, mouth sores, sore throat and trouble swallowing.    Eyes: Negative.    Respiratory: Negative. Negative for cough, chest tightness, shortness of breath and wheezing.    Cardiovascular: Negative.  Negative for chest pain, palpitations and leg swelling.   Gastrointestinal:  Positive for constipation. Negative for abdominal distention, abdominal pain, anal bleeding, blood in stool, diarrhea, nausea, rectal pain and vomiting.   Endocrine: Positive for cold intolerance.   Genitourinary: Negative.    Musculoskeletal: Negative.  Negative for arthralgias, back pain, neck pain and neck stiffness.   Skin: Negative.  Negative for color change, pallor, rash and wound.   Neurological:  Positive for numbness. Negative for dizziness, weakness, light-headedness and headaches.   Psychiatric/Behavioral: Negative.          Past Medical History:    Allergy    Anxiety    Anxiety disorder    Breast cancer (HCC)    Cancer of colon (HCC)    Depression    Depressive disorder, not elsewhere classified    Heartburn    Hx of radiation therapy    Hyperlipemia    Hypertension    Hypothyroidism    Insulin resistance    Limb alert care status    Obesity    PCOS (polycystic ovarian syndrome)    Personal history of irradiation    Seasonal allergies    Tachyarrhythmia     Surgical History:   Procedure Laterality Date    HX TONSILLECTOMY  2001    turbinates/adenoids 2015    HX TONSIL AND ADENOIDECTOMY  2011    LEFT PERONEAL TENDON REPAIR Left 12/15/2016    Performed by Tanja Port, MD at [facility] OR    Left Skin Sparing Mastectomy Left 08/21/2019    Performed by Renford Dills, MD at [facility] OR    IDENTIFICATION SENTINEL LYMPH NODE Right 08/21/2019    Performed by Renford Dills, MD at [facility] OR    INJECTION RADIOACTIVE TRACER FOR SENTINEL NODE IDENTIFICATION Right 08/21/2019    Performed by Renford Dills, MD at [facility] OR    RADIOLOGICAL EXAM SURGICAL SPECIMEN Right 08/21/2019    Performed by Renford Dills, MD at [facility] OR    MASTECTOMY - MODIFIED RADICAL WITH AXILLARY LYMPH NODE DISSECTION Right 08/21/2019    Performed by Renford Dills, MD at [facility] OR    RECONSTRUCTION BREAST WITH TISSUE EXPANDER AND SUBSEQUENT EXPANSION - IMMEDIATE/ DELAYED Bilateral 08/21/2019    Performed by Marlinda Mike, MD at [facility] OR    IMPLANTATION BIOLOGIC IMPLANT FOR SOFT TISSUE REINFORCEMENT Bilateral 08/21/2019    Performed by Marlinda Mike, MD at [facility] OR    RECONSTRUCTION BREAST WITH DEEP INFERIOR EPIGASTRIC PERFORATOR FLAP/ SUPERFICIAL INFERIOR EPIGASTRIC ARTERY FLAP Bilateral 07/02/2020    Performed by Marlinda Mike, MD at [facility] OR    RECONSTRUCTION BREAST WITH FREE FLAP - request 22 modifier, complicated procedure due to obesity and intramuscular course Bilateral 07/02/2020    Performed by Marlinda Mike, MD at [facility] OR  INTRAVENOUS INJECTION AGENT TO TEST VASCULAR FLOW IN FLAP/ GRAFT Bilateral 07/02/2020    Performed by Marlinda Mike, MD at [facility] OR    APPLICATION NEGATIVE PRESSURE WOUND THERAPY Bilateral 07/02/2020    Performed by Marlinda Mike, MD at [facility] OR    IMPLANTATION MESH CLOSURE OF DEBRIDEMENT FOR NECROTIZING SOFT TISSUE INFECTION Bilateral 07/02/2020    Performed by Marlinda Mike, MD at [facility] OR    REMOVAL TISSUE EXPANDER Bilateral 07/02/2020    Performed by Marlinda Mike, MD at [facility] OR    INCISION AND DRAINAGE HEMATOMA/ SEROMA - TORSO N/A 08/05/2020    Performed by Marlinda Mike, MD at [facility] OR    REPAIR INTERMEDIATE WOUND 12.6 CM TO 20.0 CM - TRUNK - total length 24cm  08/05/2020    Performed by Marlinda Mike, MD at [facility] OR    GRAFTING AUTOLOGOUS FAT BY LIPOSUCTION TO TRUNK/ BREASTS/ SCALP/ ARMS/ LEGS - 50 CC OR LESS 60cc to the R, 90cc to the L Bilateral 08/10/2021    Performed by Marlinda Mike, MD at [facility] OR    GRAFTING AUTOLOGOUS FAT BY LIPOSUCTION TO TRUNK/ BREASTS/ SCALP/ ARMS/ LEGS - EACH ADDITIONAL 50 CC Bilateral 08/10/2021    Performed by Marlinda Mike, MD at [facility] OR    REVISION RECONSTRUCTED BREAST Left 08/10/2021    Performed by Marlinda Mike, MD at [facility] OR    REPAIR INTERMEDIATE WOUND  2.5 CM OR LESS - TORSO abdominal dogear removal R side - 9cm, L side - 10cm N/A 08/10/2021    Performed by Marlinda Mike, MD at [facility] OR    COLONOSCOPY DIAGNOSTIC WITH SPECIMEN COLLECTION BY BRUSHING/ WASHING - FLEXIBLE N/A 06/23/2022    Performed by Buckles, Vinnie Level, MD at [facility] OR    ROBOT ASSISTED LEFT COLECTOMY, CONVERTED TO OPEN N/A 07/23/2022    Performed by Benetta Spar, MD at [facility] OR    ROBOT ASSISTED SURGERY N/A 07/23/2022    Performed by Benetta Spar, MD at [facility] OR    BREAST SURGERY      CARDIOVASCULAR STRESS TEST      COSMETIC SURGERY      ECHOCARDIOGRAM PROCEDURE      ELECTROCARDIOGRAM      TUNNELED VENOUS PORT PLACEMENT  July 2024     Family History   Problem Relation Name Age of Onset    Colon Polyps Mother Merri Brunette     Heart Attack Mother Merri Brunette     Diabetes Mother Merri Brunette     [facility] Mother Merri Brunette 50    Heart Disease Mother Merri Brunette     Hypertension Mother Merri Brunette     High Cholesterol Mother Merri Brunette     Sudden Cardiac Death Mother Merri Brunette     Cancer Mother Merri Brunette     Heart Disease Father Rockwell [place]     Hypothyroid Father Rockwell [place]     Hypertension Father Rockwell [place]     Thyroid Disease Father Rockwell [place]     High Cholesterol Father Rockwell [place]     Hypertension Brother Royal Hawthorn     Thyroid Disease Brother Royal Hawthorn     [facility] Maternal Elana Alm Podzimek 16 - 10    Diabetes Maternal Aunt Wilma Podzimek     Arthritis-rheumatoid Maternal Aunt Wilma Podzimek     Cancer-Breast Maternal Aunt Wilma Podzimek     Cancer-Ovarian Maternal Aunt Wilma Podzimek     Diabetes  Maternal Grandmother Hurman Horn     Heart Disease Maternal Grandfather Jolaine Artist     Sudden Cardiac Death Maternal Bevelyn Ngo     Thyroid Disease Brother Magda Bernheim     Cancer-Breast Paternal Cousin  38    Cancer-Ovarian Maternal Aunt  50 - 59    Cancer-Breast Maternal Aunt Elnora 40 - 49    Cancer-Prostate Maternal Uncle Eldon 29 - 79    Cancer-Ovarian Maternal Uncle Eldon      Social History     Socioeconomic History    Marital status: Married   Tobacco Use    Smoking status: Never     Passive exposure: Never    Smokeless tobacco: Never   Vaping Use    Vaping status: Never Used   Substance and Sexual Activity    Alcohol use: Yes     Alcohol/week: 1.0 standard drink of alcohol     Types: 1 Standard drinks or equivalent per week     Comment: socially    Drug use: Never    Sexual activity: Yes     Partners: Male     Birth control/protection: I.U.D.     Vaping/E-liquid Use    Vaping Use Never User                    Objective:          carvediloL (COREG) 6.25 mg tablet Take one tablet by mouth twice daily with meals. Take with food.    CHOLEcalciferoL (vitamin D3) (OPTIMAL D3) 50,000 units capsule Take one capsule by mouth every 7 days. 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    escitalopram oxalate (LEXAPRO) 10 mg tablet TAKE ONE TABLET BY MOUTH EVERY DAY    ferrous sulfate (IRON) 325 mg (65 mg iron) tablet Take one tablet by mouth daily. Take on an empty stomach at least 1 hour before or 2 hours after food.    INTRAUTERINE DEVICE (IUD) IU by Intrauterine route.    levothyroxine (SYNTHROID) 50 mcg tablet Take one tablet by mouth daily.    lidocaine/prilocaine (EMLA) 2.5/2.5 % topical cream Apply pea sized amount of cream in a thick layer over port site 45 minutes prior to access, cover with transparent dressing    loperamide (IMODIUM A-D) 2 mg capsule TAKE ONE CAPSULE BY MOUTH THREE TIMES DAILY, 30-45 MINUTES prior to meals    LORazepam (ATIVAN) 0.5 mg tablet Take 1 tab by mouth every 6 hrs as needed N/V not controlled by Zofran or Compazine. May also use every 6 hrs as needed anxiety or at bedtime insomnia.    losartan (COZAAR) 25 mg tablet Take one-half tablet by mouth daily.    metFORMIN (GLUCOPHAGE) 1,000 mg tablet Take one tablet by mouth daily with breakfast. montelukast (SINGULAIR) 10 mg tablet Take one tablet by mouth at bedtime daily.    OLANZapine (ZYPREXA) 5 mg tablet Take one tablet by mouth at bedtime daily. Indications: nausea and vomiting caused by cancer drugs    ondansetron HCL (ZOFRAN) 8 mg tablet Take one tablet by mouth every 8 hours as needed for Nausea or Vomiting. Avoid on Days 1-2 of each chemotherapy cycle.    prochlorperazine maleate (COMPAZINE) 10 mg tablet Take one tablet by mouth  every 6 hours as needed for Nausea or Vomiting.    rosuvastatin (CRESTOR) 5 mg tablet Take one tablet by mouth daily.    tamoxifen (NOLVADEX) 20 mg tablet TAKE ONE TABLET BY MOUTH EVERY NIGHT AT BEDTIME    tirzepatide (MOUNJARO) 7.5 mg/0.5 mL injector PEN Inject 0.5 mL under the skin every 7 days. wednesday    traMADoL (ULTRAM) 50 mg tablet Take one tablet to two tablets by mouth every 6 hours as needed. Indications: pain     Vitals:    01/25/23 1122   BP: 109/70   BP Source: Arm, Left Upper   Pulse: 86   Temp: 36.1 ?C (97 ?F)   Resp: 16   SpO2: 96%   TempSrc: Temporal   PainSc: Zero   Weight: 132.5 kg (292 lb 3.2 oz)       Body mass index is 39.63 kg/m?Marland Kitchen     Pain Score: Zero       Fatigue Scale: 7    Pain Addressed:  N/A    Patient Evaluated for a Clinical Trial: Patient currently in screening for a treatment clinical trial.     Eastern Cooperative Oncology Group performance status is 0, Fully active, able to carry on all pre-disease performance without restriction.Marland Kitchen     Physical Exam  Vitals reviewed.   Constitutional:       General: She is not in acute distress.     Appearance: Normal appearance. She is well-developed. She is not ill-appearing, toxic-appearing or diaphoretic.   HENT:      Head: Normocephalic and atraumatic.      Nose: Nose normal. No rhinorrhea.      Mouth/Throat:      Mouth: Mucous membranes are moist. Mucous membranes are not pale. No oral lesions.      Pharynx: Oropharynx is clear. No oropharyngeal exudate or posterior oropharyngeal erythema. Tonsils: No tonsillar abscesses.   Eyes:      General: No scleral icterus.        Right eye: No discharge.         Left eye: No discharge.      Extraocular Movements: Extraocular movements intact.      Conjunctiva/sclera: Conjunctivae normal.      Pupils: Pupils are equal, round, and reactive to light.   Cardiovascular:      Rate and Rhythm: Normal rate and regular rhythm.      Pulses: Normal pulses.      Heart sounds: Normal heart sounds. No murmur heard.     No gallop.   Pulmonary:      Effort: Pulmonary effort is normal. No respiratory distress.      Breath sounds: Normal breath sounds. No stridor. No wheezing, rhonchi or rales.   Chest:      Chest wall: No tenderness.   Abdominal:      General: Abdomen is flat. Bowel sounds are normal. There is no distension.      Palpations: Abdomen is soft. There is no mass.      Tenderness: There is no abdominal tenderness. There is no guarding or rebound.      Hernia: No hernia is present.   Musculoskeletal:         General: No swelling, tenderness, deformity or signs of injury. Normal range of motion.      Cervical back: Normal range of motion and neck supple. No rigidity. No muscular tenderness.      Right lower leg: No edema.      Left lower leg:  No edema.   Lymphadenopathy:      Cervical: No cervical adenopathy.   Skin:     General: Skin is warm and dry.      Coloration: Skin is not jaundiced or pale.      Findings: No bruising, erythema, lesion or rash.   Neurological:      General: No focal deficit present.      Mental Status: She is alert and oriented to person, place, and time. Mental status is at baseline.      Cranial Nerves: No cranial nerve deficit.      Motor: No weakness.      Coordination: Coordination normal.      Gait: Gait normal.   Psychiatric:         Mood and Affect: Mood normal.         Behavior: Behavior normal.         Thought Content: Thought content normal.         Judgment: Judgment normal.          07/23/2022  Pathology    Final Diagnosis:     A. 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      B. Colon, extended left hemi colectomy:    Adenocarcinoma moderately differentiated. See comment.   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 Additional proximal margin#1, excision:   Colon tissue with Tubular adenomas. See comment.   Three lymph nodes negative for metastatic carcinoma (0/3).     D. Additional proximal margins #2, excision:    Tubulovillous adenoma with focal high-grade dysplasia   Eleven lymph nodes negative for metastatic carcinoma (0/11).       Comment:   Pursuant to the Quality Assurance Program at the [facility] Pathology Department, selected slides from this case have been   concurrently reviewed by the following pathologist: Dr. Juanetta Beets   who   agrees with the final diagnosis.       CASE SUMMARY: (COLON AND RECTUM: Resection)   Standard(s): AJCC-UICC 8   CAP Version: Colon and Rectum Resection 4.3.0.0     SPECIMEN     Procedure   ___ Subtotal (extended) left abdominal colectomy     Macroscopic Evaluation of Mesorectum (required for rectal cancers)   ___ Not applicable     TUMOR     Tumor Site (select all that apply)   ___ Transverse colon/Splenic flexure     Histologic Type   ___ Adenocarcinoma     Histologic Grade   ___ G2, moderately differentiated     Tumor Size   ___ Greatest dimension in Centimeters (cm): 4.0 x 3.8 x 0.7 cm     Multiple Primary Sites (e.g., hepatic flexure and transverse colon)   ___ Not applicable (no additional primary site(s) present)     Tumor Extent   ___ Invades through muscularis propria into the pericolonic or perirectal   tissue     Sub-mucosal Invasion (required only for pT1 tumors)   ___ Not applicable (not a pT1 tumor)     Macroscopic Tumor Perforation   ___ Not identified     Lymphatic and / or Vascular Invasion(select all that apply)   ___ Present (not otherwise specified)     Perineural Invasion   ___ Present     Tumor  Budding Score   ___ Intermediate (5-9)     Treatment Effect   ___ No known presurgical therapy     MARGINS     Margin Status for Invasive Carcinoma   ___ All margins negative for invasive carcinoma   +Closest Margin(s) to Invasive Carcinoma (select all that apply)   ___ Radial (circumferential)/Mesenteric   +Distance from Invasive Carcinoma to Closest Margin   Specify in Centimeters (cm)   ___ Exact distance in cm: 3.4 cm   Distance from Invasive Carcinoma to Radial (Circumferential) Margin   (required for rectal   tumors)   ___ Distance already reported as closest margin.   +Distance from Invasive Carcinoma to Distal Margin (recommended for rectal   tumors)   Specify in Centimeters (cm)   ___ Exact distance in cm: 58.0 cm   Margin(s) Involved by Invasive Carcinoma (select all that apply)   ___ Not applicable     Margin Status for Non-Invasive Tumor (select all that apply)   ___ All margins negative for high-grade dysplasia / intramucosal carcinoma   and low-grade dysplasia     REGIONAL LYMPH NODES     Regional Lymph Node Status   ___ Regional lymph nodes present   ___ Tumor present in regional lymph nodes   Number of Lymph Nodes with Tumor   ___ Exact number (specify): 5   Number of Lymph Nodes Examined   ___ Exact number (specify): 34     Tumor Deposits   ___ Not identified     DISTANT METASTASIS     Distant Site(s) Involved, if applicable (select all that apply)   ___ Not applicable     pTNM CLASSIFICATION (AJCC 8th Edition)   Reporting of pT, pN, and (when applicable) pM categories is based on   information available to the pathologist at the time the report is issued.   As per the AJCC (Chapter 1, 8th Ed.) it is the managing physician's   responsibility to establish the final pathologic stage based upon all   pertinent information, including but potentially not limited to this   pathology report.     Modified Classification (required only if applicable) (select all that   apply)   ___ Not applicable     pT Category   ___ pT3: Tumor invades through the muscularis propria into pericolorectal   tissues     T Suffix (required only if applicable)   ___ Not applicable     pN Category   pN2: Four or more regional nodes are positive   ___ pN2a: Four to six regional lymph nodes are positive     pM Category (required only if confirmed pathologically)   ___ Not applicable - pM cannot be determined from the submitted   specimen(s)     ADDITIONAL FINDINGS     +Additional Findings (select all that apply)   ___ Adenoma(s) (Tubulovillous adenoma with high grade dysplasia, 6.3 cm.   1.9 cm., rectosigmoid; tubular adenomas)      Latest Reference Range & Units 01/25/23 10:37   Hemoglobin 12.0 - 15.0 g/dL 47.8 (L)   Hematocrit 29.5 - 45.0 % 30.0 (L)   Platelet Count 150 - 400 10*3/uL 217   White Blood Cells 4.50 - 11.00 10*3/uL 4.30 (L)   Neutrophils 41 - 77 % 61   Absolute Neutrophil Count 1.80 - 7.00 10*3/uL 2.60   Lymphocytes 24 - 44 % 24   Absolute Lymph Count 1.00 - 4.80 10*3/uL 1.00   Monocytes 4 - 12 %  10   Absolute Monocyte Count 0.00 - 0.80 10*3/uL 0.40   Eosinophils 0 - 5 % 4   Absolute Eosinophil Count 0.00 - 0.45 10*3/uL 0.20   Absolute Basophil Count 0.00 - 0.20 10*3/uL 0.00   Basophils 0 - 2 % 1   RBC 4.00 - 5.00 10*6/uL 3.46 (L)   MCV 80.0 - 100.0 fL 86.8   MCH 26.0 - 34.0 pg 29.5   MCHC 32.0 - 36.0 g/dL 16.1   MPV 7.0 - 09.6 fL 6.3 (L)   RDW 11.0 - 15.0 % 17.8 (H)   Sodium 137 - 147 mmol/L 140   Potassium 3.5 - 5.1 mmol/L 4.1   Chloride 98 - 110 mmol/L 106   CO2 21 - 30 mmol/L 27   Anion Gap 3 - 12  7   Blood Urea Nitrogen 7 - 25 mg/dL 15   Creatinine 0.45 - 1.00 mg/dL 4.09 (H)   Glomerular Filtration Rate (GFR) >60 mL/min >60   Glucose 70 - 100 mg/dL 811 (H)   Albumin 3.5 - 5.0 g/dL 3.6   Calcium 8.5 - 91.4 mg/dL 9.0   Total Bilirubin 0.2 - 1.3 mg/dL 0.4   Total Protein 6.0 - 8.0 g/dL 6.8   AST (SGOT) 7 - 40 U/L 22   ALT (SGPT) 7 - 56 U/L 9   Alk Phosphatase 25 - 110 U/L 41   CEA <3.0 ng/mL 1.3   (L): Data is abnormally low  (H): Data is abnormally high    Assessment and Plan:    47 yr old female with history of right breast cancer (07/2019): ER/PR+, HER2- IDC pT1c N1a M0 (1 of 34 LN+) M0.  on  tamoxifen and zoladex. Who has newly diagnosed left colon adenocarcinoma s/P surgery: pT3N2 (LN 5/48) mod diff , positive for  Lymphatic and / or Vascular Invasion, and Perineural Invasion.     We discussed the study of ctDNA (C-14),  on it now    01/04/2023  Continues to have adjuvant chemo FOLFOX (so far 9 treatments   follow up with new labs, clinically doing well, tolerate the treatment well,  no F/C/N/V/D,  < G1 peripheral neuropathy    Lab reviewed-stable    Plan:    Noted thick sensation in fingertips, minor difficulty with fine motor tasks. No pain or functional impairment. Currently on cycle 11 of chemotherapy with oxaliplatin.  Reduce dose of oxaliplatin to 75 mg/m2 due to neuropathy.  -Consider discontinuation of oxaliplatin at next cycle if neuropathy progresses.  Nausea controlled with medication.  RTC in 2 weeks with labs and visit for C12, last cycle of adjuvant chemo.  -Imaging scheduled for February 12th, then follow up with surgeon and Dr. Wynelle Link  -Port flush needed by end of March, can be done at [facility] or [facility]  -Next appointment with Dr. Daphine Deutscher on February 12th.   Continue follow breast cancer care team, last seen 01/04/23  Continue supportive care. Pt to call with any questions, concerns or symptoms at any time and we can see her sooner.     Turner Daniels, APRN-NP        The patient and family were allowed to ask questions and voice concerns; these were addressed to the best of our ability. They expressed understanding of what was explained to them, and they agreed with the present plan. RTC in 2 weeks with labs to see a provider. Patient has the phone numbers for the Cancer Center and was instructed on how to  contact us with any questions or concerns. My collaborating physician on this case is Dr. Wynelle Link.

## 2023-01-25 ENCOUNTER — Encounter: Admit: 2023-01-25 | Discharge: 2023-01-25 | Payer: PRIVATE HEALTH INSURANCE

## 2023-01-25 ENCOUNTER — Encounter: Admit: 2023-01-25 | Discharge: 2023-01-25 | Payer: 59

## 2023-01-25 DIAGNOSIS — C50411 Malignant neoplasm of upper-outer quadrant of right female breast: Secondary | ICD-10-CM

## 2023-01-25 DIAGNOSIS — K591 Functional diarrhea: Secondary | ICD-10-CM

## 2023-01-25 DIAGNOSIS — C186 Malignant neoplasm of descending colon: Secondary | ICD-10-CM

## 2023-01-25 LAB — CEA(CARCINOEMBRYONIC AG): ~~LOC~~ BKR CEA: 1.3 ng/mL (ref <3.0)

## 2023-01-25 LAB — CBC AND DIFF
~~LOC~~ BKR ABSOLUTE BASO COUNT: 0 10*3/uL (ref 0.00–0.20)
~~LOC~~ BKR ABSOLUTE EOS COUNT: 0.2 10*3/uL (ref 0.00–0.45)
~~LOC~~ BKR ABSOLUTE MONO COUNT: 0.4 10*3/uL (ref 0.00–0.80)
~~LOC~~ BKR MCHC: 33 g/dL — ABNORMAL HIGH (ref 32.0–36.0)
~~LOC~~ BKR MCV: 86 fL — ABNORMAL HIGH (ref 80.0–100.0)
~~LOC~~ BKR MPV: 6.3 fL — ABNORMAL LOW (ref 7.0–11.0)
~~LOC~~ BKR PLATELET COUNT: 217 10*3/uL (ref 150–400)
~~LOC~~ BKR RBC COUNT: 3.4 10*6/uL — ABNORMAL LOW (ref 4.00–5.00)
~~LOC~~ BKR RDW: 17 % — ABNORMAL HIGH (ref 11.0–15.0)
~~LOC~~ BKR WBC COUNT: 4.3 10*3/uL — ABNORMAL LOW (ref 4.50–11.00)

## 2023-01-25 LAB — COMPREHENSIVE METABOLIC PANEL
~~LOC~~ BKR ALBUMIN: 3.6 g/dL (ref 3.5–5.0)
~~LOC~~ BKR ALK PHOSPHATASE: 41 U/L (ref 25–110)
~~LOC~~ BKR ALT: 9 U/L (ref 7–56)
~~LOC~~ BKR ANION GAP: 7 10*3/uL (ref 3–12)
~~LOC~~ BKR AST: 22 U/L (ref 7–40)
~~LOC~~ BKR BLD UREA NITROGEN: 15 mg/dL (ref 7–25)
~~LOC~~ BKR CHLORIDE: 106 mmol/L — ABNORMAL LOW (ref 98–110)
~~LOC~~ BKR CO2: 27 mmol/L (ref 21–30)
~~LOC~~ BKR GLOMERULAR FILTRATION RATE (GFR): >60 mL/min (ref >60–4.80)
~~LOC~~ BKR POTASSIUM: 4.1 mmol/L — ABNORMAL LOW (ref 3.5–5.1)

## 2023-01-25 MED ORDER — LORAZEPAM 0.5 MG PO TAB
.5 mg | Freq: Once | ORAL | 0 refills | Status: CP
Start: 2023-01-25 — End: ?
  Administered 2023-01-25: 20:00:00 0.5 mg via ORAL

## 2023-01-25 MED ORDER — LOPERAMIDE 2 MG PO CAP
2 mg | Freq: Once | ORAL | 0 refills | Status: CP
Start: 2023-01-25 — End: ?
  Administered 2023-01-25: 22:00:00 2 mg via ORAL

## 2023-01-25 MED ORDER — FLUOROURACIL IV AMB PUMP
2000 mg/m2 | Freq: Once | INTRAVENOUS | 0 refills | Status: CP
Start: 2023-01-25 — End: ?
  Administered 2023-01-25 (×3): 4420 mg via INTRAVENOUS

## 2023-01-25 MED ORDER — DEXAMETHASONE 4 MG PO TAB
8 mg | Freq: Once | ORAL | 0 refills | Status: CP
Start: 2023-01-25 — End: ?
  Administered 2023-01-25: 20:00:00 8 mg via ORAL

## 2023-01-25 MED ORDER — PALONOSETRON 0.25 MG/5 ML IV SOLN
.25 mg | Freq: Once | INTRAVENOUS | 0 refills | Status: CP
Start: 2023-01-25 — End: ?
  Administered 2023-01-25: 20:00:00 0.25 mg via INTRAVENOUS

## 2023-01-25 MED ORDER — OXALIPLATIN IVPB
75 mg/m2 | Freq: Once | INTRAVENOUS | 0 refills | Status: CP
Start: 2023-01-25 — End: ?
  Administered 2023-01-25 (×2): 165.75 mg via INTRAVENOUS

## 2023-01-25 NOTE — Patient Instructions
Call Immediately to report the following:  Unexplained bleeding or bleeding that will not stop  Difficulty swallowing  Shortness of breath, wheezing, or trouble breathing  Rapid, irregular heartbeat; chest pain  Dizziness, lightheadedness  Rash or cut that swells or turns red, feels hot or painful, or begin to ooze  Diarrhea   Uncontrolled nausea or vomiting  Fever of 100.4 F or higher, or chills    Important Phone Numbers:  Cancer Center Main Number (answered 24 hours a day) [phone]  Cancer Center Scheduling (appointments) [phone]  Social Worker [phone]  Nutritionist [phone]

## 2023-01-25 NOTE — Progress Notes
CHEMO NOTE  Verified chemo consent signed and in chart.    Blood return positive via: Port (Single)    BSA and dose double checked (agree with orders as written) with: yes RN    Labs/applicable tests checked: CBC and Comprehensive Metabolic Panel (CMP)    Chemo regimen: Drug/cycle/dayoxaliplatin (ELOXATIN) 165.75 mg in dextrose 5% (D5) 283.15 mL IVPB  fluorouraciL (ADRUCIL) 4,420 mg in sodium chloride 0.9% 92 mL IV amb pump    Rate verified and armband double check with second RN: yes    Patient education offered and stated understanding. Denies questions at this time.    Chemo consent signed on 08/31/22.  Pt tolerated oxaliplatin (ELOXATIN) 165.75 mg in dextrose 5% (D5) 283.15 mL IVPB   infusion without incident.  Port flushed with good blood return and connected to 5FU ambulatory pump.  Pt denies need for AVS, left ambulatory at 1629.

## 2023-01-27 ENCOUNTER — Encounter: Admit: 2023-01-27 | Discharge: 2023-01-27 | Payer: 59

## 2023-01-27 ENCOUNTER — Encounter: Admit: 2023-01-27 | Discharge: 2023-01-27 | Payer: PRIVATE HEALTH INSURANCE

## 2023-01-27 DIAGNOSIS — C186 Malignant neoplasm of descending colon: Secondary | ICD-10-CM

## 2023-01-27 DIAGNOSIS — K591 Functional diarrhea: Secondary | ICD-10-CM

## 2023-01-27 NOTE — Progress Notes
Pt here for pump disconnect. 1.57mL of chemotherapy remaining and bolused per treatment plan. Chemotherapy completely infused. Pt voices no complaints. Port flushed with saline and de accessed. Pt discharged home in stable condition.

## 2023-01-28 ENCOUNTER — Encounter: Admit: 2023-01-28 | Discharge: 2023-01-28 | Payer: 59

## 2023-01-28 MED ORDER — TAMOXIFEN 20 MG PO TAB
ORAL_TABLET | 1 refills | Status: AC
Start: 2023-01-28 — End: ?

## 2023-01-28 NOTE — Telephone Encounter
 Per office visit note from 12/2022, patient is to continue on this medication. Follow up visit with Dr. Milta Deiters office is appropriately scheduled. Will send in refill to pharmacy as requested.

## 2023-02-03 ENCOUNTER — Encounter: Admit: 2023-02-03 | Discharge: 2023-02-03 | Payer: PRIVATE HEALTH INSURANCE

## 2023-02-06 ENCOUNTER — Encounter: Admit: 2023-02-06 | Discharge: 2023-02-06 | Payer: PRIVATE HEALTH INSURANCE

## 2023-02-07 ENCOUNTER — Encounter: Admit: 2023-02-07 | Discharge: 2023-02-07 | Payer: PRIVATE HEALTH INSURANCE

## 2023-02-08 ENCOUNTER — Encounter: Admit: 2023-02-08 | Discharge: 2023-02-08 | Payer: PRIVATE HEALTH INSURANCE

## 2023-02-08 ENCOUNTER — Encounter: Admit: 2023-02-08 | Discharge: 2023-02-08 | Payer: 59

## 2023-02-08 DIAGNOSIS — K591 Functional diarrhea: Secondary | ICD-10-CM

## 2023-02-08 DIAGNOSIS — C186 Malignant neoplasm of descending colon: Secondary | ICD-10-CM

## 2023-02-08 DIAGNOSIS — Z006 Encounter for examination for normal comparison and control in clinical research program: Secondary | ICD-10-CM

## 2023-02-08 LAB — COMPREHENSIVE METABOLIC PANEL
~~LOC~~ BKR ALT: 9 U/L (ref 7–56)
~~LOC~~ BKR ANION GAP: 9 10*3/uL (ref 3–12)
~~LOC~~ BKR CO2: 25 mmol/L (ref 21–30)
~~LOC~~ BKR GLOMERULAR FILTRATION RATE (GFR): >60 mL/min (ref >60–0.45)
~~LOC~~ BKR TOTAL PROTEIN: 7.3 g/dL (ref 6.0–8.0)

## 2023-02-08 LAB — CBC AND DIFF
~~LOC~~ BKR ABSOLUTE BASO COUNT: 0.1 10*3/uL (ref 0.00–0.20)
~~LOC~~ BKR BASOPHILS %: 1 % (ref 0–2)
~~LOC~~ BKR EOSINOPHILS %: 3 % (ref 0–5)
~~LOC~~ BKR HEMATOCRIT: 33 % — ABNORMAL LOW (ref 36.0–45.0)
~~LOC~~ BKR HEMOGLOBIN: 11 g/dL — ABNORMAL LOW (ref 12.0–15.0)
~~LOC~~ BKR LYMPHOCYTES %: 17 % — ABNORMAL LOW (ref 24–44)
~~LOC~~ BKR MCH: 29 pg (ref 26.0–34.0)
~~LOC~~ BKR MCHC: 33 g/dL — ABNORMAL HIGH (ref 32.0–36.0)
~~LOC~~ BKR MCV: 87 fL (ref 80.0–100.0)
~~LOC~~ BKR MONOCYTES %: 7 % (ref 4–12)
~~LOC~~ BKR MPV: 6.9 fL — ABNORMAL LOW (ref 7.0–11.0)
~~LOC~~ BKR PLATELET COUNT: 207 10*3/uL (ref 150–400)
~~LOC~~ BKR RBC COUNT: 3.8 10*6/uL — ABNORMAL LOW (ref 4.00–5.00)
~~LOC~~ BKR RDW: 17 % — ABNORMAL HIGH (ref 11.0–15.0)
~~LOC~~ BKR WBC COUNT: 6.7 10*3/uL (ref 4.50–11.00)

## 2023-02-08 LAB — CEA(CARCINOEMBRYONIC AG): ~~LOC~~ BKR CEA: 0.6 ng/mL (ref <3.0)

## 2023-02-08 NOTE — Progress Notes
Name: Jennifer Durham          MRN: [id number]      DOB: 10-16-1975      AGE: 48 y.o.   DATE OF SERVICE: 02/08/2023    Subjective:             Reason for Visit:  No chief complaint on file.      Jennifer Durham is a 48 y.o. female.      Cancer Staging   Colon cancer [facility])  Staging form: Colon And Rectum, AJCC 8th Edition  - Clinical stage from 07/23/2022: Stage IIIB (cT3, cN2a, cM0) - Signed by Miguel Rota, MD on 08/11/2022    Malignant neoplasm of upper-outer quadrant of right breast in female, estrogen receptor positive (HCC)  Staging form: Breast, AJCC 8th Edition  - Clinical stage from 07/04/2019: Stage IA (cT1c, cN0(f), cM0, G2, ER+, PR+, HER2-) - Signed by Guy Begin, PA-C on 07/12/2019  - Pathologic stage from 08/21/2019: Stage IA (pT1c, pN1a, cM0, G2, ER+, PR+, HER2-) - Signed by Guy Begin, PA-C on 08/30/2019      History of Present Illness    48 y.o. female with hx of stage IA ER/PR+, HER2- IDC of the right breast diagnosed in 06/2019 after detecting dimpling of her breast in 05/2019. She presented for mammogram which identified a spiculated mass in the upper right medial breast. She underwent ultrasound and biopsy that confirmed carcinoma . Pathology review at [place] confirmed grade 2 IDC with a focal lobular growth pattern and lymphoid tissue with no evidence of carcinoma in the right axillary specimen. She was recommended to undergo upfront surgery. On 08/01/2019, she underwent bilateral skin sparing mastectomy and right sentinel lymph node biopsy, axilla lymph node dissection, and reconstruction.  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  A 5 mm focus of metastatic carcinoma was found in 1 of 4 sentinel lymph nodes.  0 of 27 additional axillary nodes were involved. pT1c N1a M0 (1 of 34 LN+) M0. No LVI, No EIC, No ENE. She was recommended to receive adjuvant chemotherapy with TC x4 cycles,  underwent adjuvant radiotherapy to her right reconstructed breast, chest wall and regional nodes to a dose of 4526 cGy in 16 fractions from 02/19/2020 - 03/11/2020.      She is on tamoxifen and zoladex.     history of hypertension, polycystic ovarian disease, and prediabetesa colonoscopy     She had a colonoscopy performed on May 29th, 2024. The colonoscopy revealed a moderately differentiated adenocarcinoma suspected to be at the splenic flexure, 60 cm from the anal verge, and a large rectosigmoid polyp described as pedunculated. The polyp was biopsied but not removed and was identified as a tubulovillous adenoma. CT of the chest, abdomen, and pelvis, which showed no evidence of metastatic disease. The patient's CEA level was 0.8, and she was found to be microsatellite stable.     07/23/2022  Extended left hemi colectomy: pT3N2 (LN 5/48) mod diff adenocarcinoma, Lymphatic and / or Vascular Invasion: Present; Perineural Invasion--Present     Interval History,     The patient presents for her next chemotherapy. 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 Her port could not be accessed today, it may be flipped.            Review of Systems   Constitutional:  Positive for fatigue. Negative for activity change, appetite change, chills, diaphoresis,  fever and unexpected weight change.   HENT:  Negative for facial swelling, mouth sores, sore throat and trouble swallowing.    Eyes: Negative.    Respiratory: Negative.  Negative for cough, chest tightness, shortness of breath and wheezing.    Cardiovascular: Negative.  Negative for chest pain, palpitations and leg swelling.   Gastrointestinal:  Positive for constipation. Negative for abdominal distention, abdominal pain, anal bleeding, blood in stool, diarrhea, nausea, rectal pain and vomiting.   Endocrine: Positive for cold intolerance.   Genitourinary: Negative.    Musculoskeletal: Negative.  Negative for arthralgias, back pain, neck pain and neck stiffness.   Skin: Negative.  Negative for color change, pallor, rash and wound.   Neurological:  Positive for numbness. Negative for dizziness, weakness, light-headedness and headaches.   Psychiatric/Behavioral: Negative.          Past Medical History:    Allergy    Anxiety    Anxiety disorder    Breast cancer (HCC)    Cancer of colon (HCC)    Depression    Depressive disorder, not elsewhere classified    Heartburn    Hx of radiation therapy    Hyperlipemia    Hypertension    Hypothyroidism    Insulin resistance    Limb alert care status    Obesity    PCOS (polycystic ovarian syndrome)    Personal history of irradiation    Seasonal allergies    Tachyarrhythmia     Surgical History:   Procedure Laterality Date    HX TONSILLECTOMY  2001    turbinates/adenoids 2015    HX TONSIL AND ADENOIDECTOMY  2011    LEFT PERONEAL TENDON REPAIR Left 12/15/2016    Performed by Tanja Port, MD at [facility] [place]    Left Skin Sparing Mastectomy Left 08/21/2019    Performed by Renford Dills, MD at [facility] OR    IDENTIFICATION SENTINEL LYMPH NODE Right 08/21/2019    Performed by Renford Dills, MD at [facility] OR    INJECTION RADIOACTIVE TRACER FOR SENTINEL NODE IDENTIFICATION Right 08/21/2019    Performed by Renford Dills, MD at [facility] OR    RADIOLOGICAL EXAM SURGICAL SPECIMEN Right 08/21/2019    Performed by Renford Dills, MD at [facility] OR    MASTECTOMY - MODIFIED RADICAL WITH AXILLARY LYMPH NODE DISSECTION Right 08/21/2019    Performed by Renford Dills, MD at [facility] OR    RECONSTRUCTION BREAST WITH TISSUE EXPANDER AND SUBSEQUENT EXPANSION - IMMEDIATE/ DELAYED Bilateral 08/21/2019    Performed by Marlinda Mike, MD at [facility] OR    IMPLANTATION BIOLOGIC IMPLANT FOR SOFT TISSUE REINFORCEMENT Bilateral 08/21/2019    Performed by Marlinda Mike, MD at [facility] OR    RECONSTRUCTION BREAST WITH DEEP INFERIOR EPIGASTRIC PERFORATOR FLAP/ SUPERFICIAL INFERIOR EPIGASTRIC ARTERY FLAP Bilateral 07/02/2020    Performed by Marlinda Mike, MD at [facility] OR    RECONSTRUCTION BREAST WITH FREE FLAP - request 22 modifier, complicated procedure due to obesity and intramuscular course Bilateral 07/02/2020    Performed by Marlinda Mike, MD at [facility] OR    INTRAVENOUS INJECTION AGENT TO TEST VASCULAR FLOW IN FLAP/ GRAFT Bilateral 07/02/2020    Performed by Marlinda Mike, MD at [facility] OR    APPLICATION NEGATIVE PRESSURE WOUND THERAPY Bilateral 07/02/2020    Performed by Marlinda Mike, MD at [facility] OR    IMPLANTATION MESH CLOSURE OF DEBRIDEMENT FOR NECROTIZING SOFT TISSUE INFECTION Bilateral 07/02/2020    Performed by  Marlinda Mike, MD at [facility] OR    REMOVAL TISSUE EXPANDER Bilateral 07/02/2020    Performed by Marlinda Mike, MD at [facility] OR    INCISION AND DRAINAGE HEMATOMA/ SEROMA - TORSO N/A 08/05/2020    Performed by Marlinda Mike, MD at [facility] OR    REPAIR INTERMEDIATE WOUND 12.6 CM TO 20.0 CM - TRUNK - total length 24cm  08/05/2020    Performed by Marlinda Mike, MD at [facility] OR    GRAFTING AUTOLOGOUS FAT BY LIPOSUCTION TO TRUNK/ BREASTS/ SCALP/ ARMS/ LEGS - 50 CC OR LESS 60cc to the R, 90cc to the L Bilateral 08/10/2021    Performed by Marlinda Mike, MD at [facility] OR    GRAFTING AUTOLOGOUS FAT BY LIPOSUCTION TO TRUNK/ BREASTS/ SCALP/ ARMS/ LEGS - EACH ADDITIONAL 50 CC Bilateral 08/10/2021    Performed by Marlinda Mike, MD at [facility] OR    REVISION RECONSTRUCTED BREAST Left 08/10/2021    Performed by Marlinda Mike, MD at [facility] OR    REPAIR INTERMEDIATE WOUND  2.5 CM OR LESS - TORSO abdominal dogear removal R side - 9cm, L side - 10cm N/A 08/10/2021    Performed by Marlinda Mike, MD at [facility] OR    COLONOSCOPY DIAGNOSTIC WITH SPECIMEN COLLECTION BY BRUSHING/ WASHING - FLEXIBLE N/A 06/23/2022    Performed by Buckles, Vinnie Level, MD at [facility] OR    ROBOT ASSISTED LEFT COLECTOMY, CONVERTED TO OPEN N/A 07/23/2022    Performed by Benetta Spar, MD at [facility] OR    ROBOT ASSISTED SURGERY N/A 07/23/2022    Performed by Benetta Spar, MD at [facility] OR    BREAST SURGERY      CARDIOVASCULAR STRESS TEST      COSMETIC SURGERY      ECHOCARDIOGRAM PROCEDURE ELECTROCARDIOGRAM      TUNNELED VENOUS PORT PLACEMENT  July 2024     Family History   Problem Relation Name Age of Onset    Colon Polyps Mother Merri Brunette     Heart Attack Mother Merri Brunette     Diabetes Mother Merri Brunette     [facility] Mother Merri Brunette 50    Heart Disease Mother Merri Brunette     Hypertension Mother Merri Brunette     High Cholesterol Mother Merri Brunette     Sudden Cardiac Death Mother Merri Brunette     Cancer Mother Merri Brunette     Heart Disease Father Rockwell [place]     Hypothyroid Father Rockwell [place]     Hypertension Father Rockwell [place]     Thyroid Disease Father Rockwell [place]     High Cholesterol Father Rockwell [place]     Hypertension Brother Royal Hawthorn     Thyroid Disease Brother Royal Hawthorn     Cancer-Pancreas Maternal Elana Alm Podzimek 60    Diabetes Maternal Aunt [place] Podzimek     Arthritis-rheumatoid Maternal Aunt Wilma Podzimek     Cancer-Breast Maternal Aunt Wilma Podzimek     Cancer-Ovarian Maternal Aunt [place] Podzimek     Diabetes Maternal Hart Carwin [place]     Heart Disease Maternal Grandfather Jolaine Artist     Sudden Cardiac Death Maternal Bevelyn Ngo     Thyroid Disease Brother Magda Bernheim     Cancer-Breast Paternal Cousin  20    Cancer-Ovarian Maternal Aunt  50    Cancer-Breast Maternal Aunt Elnora 40    Cancer-Prostate Maternal Uncle Eldon 67    Cancer-Ovarian Maternal Uncle  Eldon      Social History     Socioeconomic History    Marital status: Married   Tobacco Use    Smoking status: Never     Passive exposure: Never    Smokeless tobacco: Never   Vaping Use    Vaping status: Never Used   Substance and Sexual Activity    Alcohol use: Yes     Alcohol/week: 1.0 standard drink of alcohol     Types: 1 Standard drinks or equivalent per week     Comment: socially    Drug use: Never    Sexual activity: Yes     Partners: Male     Birth control/protection: I.U.D.     Vaping/E-liquid Use    Vaping Use Never User                    Objective:          carvediloL (COREG) 6.25 mg tablet Take one tablet by mouth twice daily with meals. Take with food.    CHOLEcalciferoL (vitamin D3) (OPTIMAL D3) 50,000 units capsule Take one capsule by mouth every 7 days. 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    escitalopram oxalate (LEXAPRO) 10 mg tablet TAKE ONE TABLET BY MOUTH EVERY DAY    ferrous sulfate (IRON) 325 mg (65 mg iron) tablet Take one tablet by mouth daily. Take on an empty stomach at least 1 hour before or 2 hours after food.    INTRAUTERINE DEVICE (IUD) IU by Intrauterine route.    levothyroxine (SYNTHROID) 50 mcg tablet Take one tablet by mouth daily.    lidocaine/prilocaine (EMLA) 2.5/2.5 % topical cream Apply pea sized amount of cream in a thick layer over port site 45 minutes prior to access, cover with transparent dressing    loperamide (IMODIUM A-D) 2 mg capsule TAKE ONE CAPSULE BY MOUTH THREE TIMES DAILY, 30-45 MINUTES prior to meals    LORazepam (ATIVAN) 0.5 mg tablet Take 1 tab by mouth every 6 hrs as needed N/V not controlled by Zofran or Compazine. May also use every 6 hrs as needed anxiety or at bedtime insomnia.    losartan (COZAAR) 25 mg tablet Take one-half tablet by mouth daily.    metFORMIN (GLUCOPHAGE) 1,000 mg tablet Take one tablet by mouth daily with breakfast.    montelukast (SINGULAIR) 10 mg tablet Take one tablet by mouth at bedtime daily.    OLANZapine (ZYPREXA) 5 mg tablet Take one tablet by mouth at bedtime daily. Indications: nausea and vomiting caused by cancer drugs    ondansetron HCL (ZOFRAN) 8 mg tablet Take one tablet by mouth every 8 hours as needed for Nausea or Vomiting. Avoid on Days 1-2 of each chemotherapy cycle.    prochlorperazine maleate (COMPAZINE) 10 mg tablet Take one tablet by mouth every 6 hours as needed for Nausea or Vomiting.    rosuvastatin (CRESTOR) 5 mg tablet Take one tablet by mouth daily.    tamoxifen (NOLVADEX) 20 mg tablet TAKE 1 TABLET EVERY NIGHT AT BEDTIME    tirzepatide (MOUNJARO) 7.5 mg/0.5 mL injector PEN Inject 0.5 mL under the skin every 7 days. wednesday    traMADoL (ULTRAM) 50 mg tablet Take one tablet to two tablets by mouth every 6  hours as needed. Indications: pain     There were no vitals filed for this visit.      There is no height or weight on file to calculate BMI.                  Pain Addressed:  N/A    Patient Evaluated for a Clinical Trial: Patient currently in screening for a treatment clinical trial.     Eastern Cooperative Oncology Group performance status is 0, Fully active, able to carry on all pre-disease performance without restriction.Marland Kitchen     Physical Exam  Vitals reviewed.   Constitutional:       General: She is not in acute distress.     Appearance: Normal appearance. She is well-developed. She is not ill-appearing, toxic-appearing or diaphoretic.   HENT:      Head: Normocephalic and atraumatic.      Nose: Nose normal. No rhinorrhea.      Mouth/Throat:      Mouth: Mucous membranes are moist. Mucous membranes are not pale. No oral lesions.      Pharynx: Oropharynx is clear. No oropharyngeal exudate or posterior oropharyngeal erythema.      Tonsils: No tonsillar abscesses.   Eyes:      General: No scleral icterus.        Right eye: No discharge.         Left eye: No discharge.      Extraocular Movements: Extraocular movements intact.      Conjunctiva/sclera: Conjunctivae normal.      Pupils: Pupils are equal, round, and reactive to light.   Cardiovascular:      Rate and Rhythm: Normal rate and regular rhythm.      Pulses: Normal pulses.      Heart sounds: Normal heart sounds. No murmur heard.     No gallop.   Pulmonary:      Effort: Pulmonary effort is normal. No respiratory distress.      Breath sounds: Normal breath sounds. No stridor. No wheezing, rhonchi or rales.   Chest:      Chest wall: No tenderness.   Abdominal:      General: Abdomen is flat. Bowel sounds are normal. There is no distension.      Palpations: Abdomen is soft. There is no mass.      Tenderness: There is no abdominal tenderness. There is no guarding or rebound.      Hernia: No hernia is present.   Musculoskeletal:         General: No swelling, tenderness, deformity or signs of injury. Normal range of motion.      Cervical back: Normal range of motion and neck supple. No rigidity. No muscular tenderness.      Right lower leg: No edema.      Left lower leg: No edema.   Lymphadenopathy:      Cervical: No cervical adenopathy.   Skin:     General: Skin is warm and dry.      Coloration: Skin is not jaundiced or pale.      Findings: No bruising, erythema, lesion or rash.   Neurological:      General: No focal deficit present.      Mental Status: She is alert and oriented to person, place, and time. Mental status is at baseline.      Cranial Nerves: No cranial nerve deficit.      Motor: No weakness.      Coordination: Coordination normal.  Gait: Gait normal.   Psychiatric:         Mood and Affect: Mood normal.         Behavior: Behavior normal.         Thought Content: Thought content normal.         Judgment: Judgment normal.          07/23/2022  Pathology    Final Diagnosis:     A. 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      B. Colon, extended left hemi colectomy:    Adenocarcinoma moderately differentiated. See comment.   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 Additional proximal margin#1, excision:   Colon tissue with Tubular adenomas. See comment.   Three lymph nodes negative for metastatic carcinoma (0/3).     D. Additional proximal margins #2, excision:    Tubulovillous adenoma with focal high-grade dysplasia   Eleven lymph nodes negative for metastatic carcinoma (0/11).       Comment:   Pursuant to the Quality Assurance Program at the [facility] Pathology Department, selected slides from this case have been   concurrently reviewed by the following pathologist: Dr. Juanetta Beets   who   agrees with the final diagnosis.       CASE SUMMARY: (COLON AND RECTUM: Resection)   Standard(s): AJCC-UICC 8   CAP Version: Colon and Rectum Resection 4.3.0.0     SPECIMEN     Procedure   ___ Subtotal (extended) left abdominal colectomy     Macroscopic Evaluation of Mesorectum (required for rectal cancers)   ___ Not applicable     TUMOR     Tumor Site (select all that apply)   ___ Transverse colon/Splenic flexure     Histologic Type   ___ Adenocarcinoma     Histologic Grade   ___ G2, moderately differentiated     Tumor Size   ___ Greatest dimension in Centimeters (cm): 4.0 x 3.8 x 0.7 cm     Multiple Primary Sites (e.g., hepatic flexure and transverse colon)   ___ Not applicable (no additional primary site(s) present)     Tumor Extent   ___ Invades through muscularis propria into the pericolonic or perirectal   tissue     Sub-mucosal Invasion (required only for pT1 tumors)   ___ Not applicable (not a pT1 tumor)     Macroscopic Tumor Perforation   ___ Not identified     Lymphatic and / or Vascular Invasion(select all that apply)   ___ Present (not otherwise specified)     Perineural Invasion   ___ Present     Tumor Budding Score   ___ Intermediate (5-9)     Treatment Effect   ___ No known presurgical therapy     MARGINS     Margin Status for Invasive Carcinoma   ___ All margins negative for invasive carcinoma   +Closest Margin(s) to Invasive Carcinoma (select all that apply)   ___ Radial (circumferential)/Mesenteric   +Distance from Invasive Carcinoma to Closest Margin   Specify in Centimeters (cm)   ___ Exact distance in cm: 3.4 cm   Distance from Invasive Carcinoma to Radial (Circumferential) Margin   (required for rectal   tumors)   ___ Distance already reported as closest margin.   +Distance from Invasive Carcinoma to Distal  Margin (recommended for rectal   tumors)   Specify in Centimeters (cm)   ___ Exact distance in cm: 58.0 cm   Margin(s) Involved by Invasive Carcinoma (select all that apply)   ___ Not applicable     Margin Status for Non-Invasive Tumor (select all that apply)   ___ All margins negative for high-grade dysplasia / intramucosal carcinoma   and low-grade dysplasia     REGIONAL LYMPH NODES     Regional Lymph Node Status   ___ Regional lymph nodes present   ___ Tumor present in regional lymph nodes   Number of Lymph Nodes with Tumor   ___ Exact number (specify): 5   Number of Lymph Nodes Examined   ___ Exact number (specify): 34     Tumor Deposits   ___ Not identified     DISTANT METASTASIS     Distant Site(s) Involved, if applicable (select all that apply)   ___ Not applicable     pTNM CLASSIFICATION (AJCC 8th Edition)   Reporting of pT, pN, and (when applicable) pM categories is based on   information available to the pathologist at the time the report is issued.   As per the AJCC (Chapter 1, 8th Ed.) it is the managing physician's   responsibility to establish the final pathologic stage based upon all   pertinent information, including but potentially not limited to this   pathology report.     Modified Classification (required only if applicable) (select all that   apply)   ___ Not applicable     pT Category   ___ pT3: Tumor invades through the muscularis propria into pericolorectal   tissues     T Suffix (required only if applicable)   ___ Not applicable     pN Category   pN2: Four or more regional nodes are positive   ___ pN2a: Four to six regional lymph nodes are positive     pM Category (required only if confirmed pathologically)   ___ Not applicable - pM cannot be determined from the submitted   specimen(s)     ADDITIONAL FINDINGS     +Additional Findings (select all that apply)   ___ Adenoma(s) (Tubulovillous adenoma with high grade dysplasia, 6.3 cm.   1.9 cm., rectosigmoid; tubular adenomas)      Latest Reference Range & Units 02/08/23 10:29   Hemoglobin 12.0 - 15.0 g/dL 16.1 (L)   Hematocrit 09.6 - 45.0 % 33.7 (L)   Platelet Count 150 - 400 10*3/uL 207   White Blood Cells 4.50 - 11.00 10*3/uL 6.70   Neutrophils 41 - 77 % 71   Absolute Neutrophil Count 1.80 - 7.00 10*3/uL 4.80   Lymphocytes 24 - 44 % 17 (L)   Absolute Lymph Count 1.00 - 4.80 10*3/uL 1.10   Monocytes 4 - 12 % 7   Absolute Monocyte Count 0.00 - 0.80 10*3/uL 0.50   Eosinophils 0 - 5 % 3   Absolute Eosinophil Count 0.00 - 0.45 10*3/uL 0.20   Absolute Basophil Count 0.00 - 0.20 10*3/uL 0.10   Basophils 0 - 2 % 1   RBC 4.00 - 5.00 10*6/uL 3.86 (L)   MCV 80.0 - 100.0 fL 87.3   MCH 26.0 - 34.0 pg 29.1   MCHC 32.0 - 36.0 g/dL 04.5   MPV 7.0 - 40.9 fL 6.9 (L)   RDW 11.0 - 15.0 % 17.2 (H)   Sodium 137 - 147 mmol/L 138   Potassium 3.5 - 5.1 mmol/L 4.2   Chloride 98 -  110 mmol/L 104   CO2 21 - 30 mmol/L 25   Anion Gap 3 - 12  9   Blood Urea Nitrogen 7 - 25 mg/dL 10   Creatinine 1.61 - 1.00 mg/dL 0.96   Glomerular Filtration Rate (GFR) >60 mL/min >60   Glucose 70 - 100 mg/dL 045 (H)   Albumin 3.5 - 5.0 g/dL 3.8   Calcium 8.5 - 40.9 mg/dL 9.4   Total Bilirubin 0.2 - 1.3 mg/dL 0.4   Total Protein 6.0 - 8.0 g/dL 7.3   AST (SGOT) 7 - 40 U/L 24   ALT (SGPT) 7 - 56 U/L 9   Alk Phosphatase 25 - 110 U/L 45   CEA <3.0 ng/mL 0.6   (L): Data is abnormally low  (H): Data is abnormally high  Assessment and Plan:    48 yr old female with history of right breast cancer (07/2019): ER/PR+, HER2- IDC pT1c N1a M0 (1 of 34 LN+) M0.  on  tamoxifen and zoladex. Who has newly diagnosed left colon adenocarcinoma s/P surgery: pT3N2 (LN 5/48) mod diff , positive for  Lymphatic and / or Vascular Invasion, and Perineural Invasion.     We discussed the study of ctDNA (C-14),  on it now    02/08/2023:  The patient presents for her last (cycle 12) chemotherapy. 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 Her port could not be accessed today, it may be flipped.   We discussed that we can defer last cycle, she has already received 11 cycles.     Plan:  - Due for C12, last cycle of adjuvant chemo today. However, her port cannot be access and it may have flipped. She also has neuropathy. We discussed that she has already received 11 cycles of chemo and it is okay to defer the last cycle.  -Imaging scheduled for February 12th, then follow up with surgeon and Dr. Wynelle Link. Will assess port at that time, if it is truly flipped will schedule removal.  -Port flush needed by end of March, can be done at [facility] or [facility]. (Pending imaging)  -Next appointment with Dr. Daphine Deutscher on February 12th.   Continue follow breast cancer care team, last seen 01/04/23  Continue supportive care. Pt to call with any questions, concerns or symptoms at any time and we can see her sooner.     Patient seen and discussed with Dr. Wynelle Link.    Steve Rattler, M.D.  Hematology/Oncology fellow, PGY5.       Attending Note:    I saw and  examined the patient with Dr.  Henrene Pastor. I agree with the history, review of system, data,  and I am involved in and guided examination, and assessment/plan.     48 yr old female with history of right breast cancer (07/2019): ER/PR+, HER2- IDC pT1c N1a M0 (1 of 34 LN+) M0.  on  tamoxifen and zoladex. Who has newly diagnosed left colon adenocarcinoma s/P surgery: pT3N2 (LN 5/48) mod diff , positive for  Lymphatic and / or Vascular Invasion, and Perineural Invasion.     We discussed the study of ctDNA (C-14),  on it now    02/08/2023:  The patient presents for her last (cycle 12) chemotherapy. 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 Her port could not be accessed today, it may be flipped.   We discussed that we can defer last cycle, she has already received 11 cycles.       - Due for C12, last  cycle of adjuvant chemo today. However, her port cannot be access and it may have flipped. She also has neuropathy. We discussed that she has already received 11 cycles of chemo and it is okay to defer the last cycle.  -Imaging scheduled for February 12th, then follow up with surgeon and [place]. Will assess port at that time, if it is truly flipped will schedule removal.  -Port flush needed by end of March, can be done at [facility] or [facility]. (Pending imaging)  -Next appointment with Dr. Daphine Deutscher on February 12th.     Continue follow breast cancer care team, last seen 01/04/23    Continue supportive care. Pt to call with any questions, concerns or symptoms at any time and we can see her sooner.         Miguel Rota, MD, Jerrel Ivory

## 2023-02-09 ENCOUNTER — Encounter: Admit: 2023-02-09 | Discharge: 2023-02-09 | Payer: PRIVATE HEALTH INSURANCE

## 2023-02-22 ENCOUNTER — Encounter: Admit: 2023-02-22 | Discharge: 2023-02-22 | Payer: PRIVATE HEALTH INSURANCE

## 2023-02-22 ENCOUNTER — Encounter: Admit: 2023-02-22 | Discharge: 2023-02-22 | Payer: 59

## 2023-02-22 MED ORDER — ESCITALOPRAM OXALATE 10 MG PO TAB
10 mg | ORAL_TABLET | Freq: Every day | ORAL | 3 refills | Status: AC
Start: 2023-02-22 — End: ?

## 2023-03-01 ENCOUNTER — Encounter: Admit: 2023-03-01 | Discharge: 2023-03-01 | Payer: PRIVATE HEALTH INSURANCE

## 2023-03-03 ENCOUNTER — Encounter: Admit: 2023-03-03 | Discharge: 2023-03-03 | Payer: PRIVATE HEALTH INSURANCE

## 2023-03-03 NOTE — Patient Instructions
Thank you for visiting me today. If you have questions or needs following our visit, please feel free to send my team a MyChart message or give my nurse a call. I value your needs and to ensure your needs are met while I am operating, I work with a team. My team consists of my nurse practitioner Deberah Pelton), and my nurse, Penni Homans). They assist with reviewing and answering messages Monday through Friday 8am to 4pm. If it is after 4pm, your message may be returned the following business day. Our office is closed on weekends and major holidays. If you have an urgent need after hours, please call [phone] and ask for the surgical oncology resident on call to be paged.      If testing was ordered today please know it may automatically be released to your mychart. You may review this before we do.     Please feel free call my nurse Yitzel Shasteen at [phone] with any questions.     Thank you for trusting our team with your care,      Dr. Deedra Ehrich MD  Deberah Pelton MSN, APRN, AGCNS-BC, AGPCNP-BC  Einar Pheasant BSN, RN  Surgical Oncology / Colorectal Surgery  [address] [place], [place], [place] [address]  Phone: [phone] / Fax: [phone]

## 2023-03-04 ENCOUNTER — Encounter: Admit: 2023-03-04 | Discharge: 2023-03-04 | Payer: PRIVATE HEALTH INSURANCE

## 2023-03-08 ENCOUNTER — Encounter: Admit: 2023-03-08 | Discharge: 2023-03-08 | Payer: PRIVATE HEALTH INSURANCE

## 2023-03-09 ENCOUNTER — Encounter: Admit: 2023-03-09 | Discharge: 2023-03-09 | Payer: PRIVATE HEALTH INSURANCE

## 2023-03-09 ENCOUNTER — Encounter: Admit: 2023-03-09 | Discharge: 2023-03-09 | Payer: 59

## 2023-03-09 DIAGNOSIS — C189 Malignant neoplasm of colon, unspecified: Secondary | ICD-10-CM

## 2023-03-09 DIAGNOSIS — E611 Iron deficiency: Secondary | ICD-10-CM

## 2023-03-09 NOTE — Progress Notes
 Name: Jennifer Durham          MRN: [id number]      DOB: 1975-12-19      AGE: 48 y.o.   DATE OF SERVICE: 03/09/2023    Subjective:             Reason for Visit:  Follow Up    Cancer Staging   Colon cancer [facility])  Staging form: Colon And Rectum, AJCC 8th Edition  - Clinical stage from 07/23/2022: Stage IIIB (cT3, cN2a, cM0) - Signed by Miguel Rota, MD on 08/11/2022    Malignant neoplasm of upper-outer quadrant of right breast in female, estrogen receptor positive (HCC)  Staging form: Breast, AJCC 8th Edition  - Clinical stage from 07/04/2019: Stage IA (cT1c, cN0(f), cM0, G2, ER+, PR+, HER2-) - Signed by Guy Begin, PA-C on 07/12/2019  - Pathologic stage from 08/21/2019: Stage IA (pT1c, pN1a, cM0, G2, ER+, PR+, HER2-) - Signed by Guy Begin, PA-C on 08/30/2019    History of Present Illness  Jennifer Durham is a 48 year old female with a history of breast cancer S/P bilateral DIEP flaps, PCOS, and insulin resistance who underwent a screening colonoscopy 06/23/22 during which a partially obstructing MSS moderately differentiated splenic flexure adenocarcinoma was found which could not be traversed. 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 Previous genetic testing '21 was negative. She underwent a robotic converted to open subtotal colectomy with Deloyer's maneuver and splenic flexure mobilization on 07/23/2022. Final path stage 3, pT3N2a.     Today she presents for follow-up after completing 11 cycles of FOLFOX in early January.  They were unable to access her port for the 12th cycle, possibly from it flipping.  She has not had any scans since November and a CT C/A/P is scheduled for 03/22/23.    At presentation they were unable to traverse the partially obstructing splenic flexure mass and we plan for a completion colonoscopy in the near future.    She is not currently on any blood thinners. Her medical history includes insulin resistance and polycystic ovary syndrome (PCOS), but her blood sugars are reportedly stable.     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     Past Medical History:    Allergy    Anxiety    Anxiety disorder    Breast cancer (HCC)    Cancer of colon (HCC)    Depression    Depressive disorder, not elsewhere classified    Heartburn    Hx of radiation therapy    Hyperlipemia    Hypertension    Hypothyroidism    Insulin resistance    Limb alert care status    Obesity    Other malignant neoplasm without specification of site    PCOS (polycystic ovarian syndrome)    Personal history of irradiation    Seasonal allergies    Tachyarrhythmia     Surgical History:   Procedure Laterality Date    HX TONSILLECTOMY  2001    turbinates/adenoids 2015    HX TONSIL AND ADENOIDECTOMY  2011    LEFT PERONEAL TENDON REPAIR Left 12/15/2016    Performed by Tanja Port, MD at [facility] OR    Left Skin Sparing Mastectomy Left 08/21/2019    Performed by Renford Dills, MD at [facility] OR    IDENTIFICATION SENTINEL LYMPH NODE Right 08/21/2019    Performed by Renford Dills, MD at [facility] OR  INJECTION RADIOACTIVE TRACER FOR SENTINEL NODE IDENTIFICATION Right 08/21/2019    Performed by Renford Dills, MD at [facility] OR    RADIOLOGICAL EXAM SURGICAL SPECIMEN Right 08/21/2019    Performed by Renford Dills, MD at [facility] OR    MASTECTOMY - MODIFIED RADICAL WITH AXILLARY LYMPH NODE DISSECTION Right 08/21/2019    Performed by Renford Dills, MD at [facility] OR    RECONSTRUCTION BREAST WITH TISSUE EXPANDER AND SUBSEQUENT EXPANSION - IMMEDIATE/ DELAYED Bilateral 08/21/2019    Performed by Marlinda Mike, MD at [facility] OR    IMPLANTATION BIOLOGIC IMPLANT FOR SOFT TISSUE REINFORCEMENT Bilateral 08/21/2019    Performed by Marlinda Mike, MD at [facility] OR    RECONSTRUCTION BREAST WITH DEEP INFERIOR EPIGASTRIC PERFORATOR FLAP/ SUPERFICIAL INFERIOR EPIGASTRIC ARTERY FLAP Bilateral 07/02/2020    Performed by Marlinda Mike, MD at [facility] OR    RECONSTRUCTION BREAST WITH FREE FLAP - request 22 modifier, complicated procedure due to obesity and intramuscular course Bilateral 07/02/2020    Performed by Marlinda Mike, MD at [facility] OR    INTRAVENOUS INJECTION AGENT TO TEST VASCULAR FLOW IN FLAP/ GRAFT Bilateral 07/02/2020    Performed by Marlinda Mike, MD at [facility] OR    APPLICATION NEGATIVE PRESSURE WOUND THERAPY Bilateral 07/02/2020    Performed by Marlinda Mike, MD at [facility] OR    IMPLANTATION MESH CLOSURE OF DEBRIDEMENT FOR NECROTIZING SOFT TISSUE INFECTION Bilateral 07/02/2020    Performed by Marlinda Mike, MD at [facility] OR    REMOVAL TISSUE EXPANDER Bilateral 07/02/2020    Performed by Marlinda Mike, MD at [facility] OR    INCISION AND DRAINAGE HEMATOMA/ SEROMA - TORSO N/A 08/05/2020    Performed by Marlinda Mike, MD at [facility] OR    REPAIR INTERMEDIATE WOUND 12.6 CM TO 20.0 CM - TRUNK - total length 24cm  08/05/2020    Performed by Marlinda Mike, MD at [facility] OR    GRAFTING AUTOLOGOUS FAT BY LIPOSUCTION TO TRUNK/ BREASTS/ SCALP/ ARMS/ LEGS - 50 CC OR LESS 60cc to the R, 90cc to the L Bilateral 08/10/2021    Performed by Marlinda Mike, MD at [facility] OR    GRAFTING AUTOLOGOUS FAT BY LIPOSUCTION TO TRUNK/ BREASTS/ SCALP/ ARMS/ LEGS - EACH ADDITIONAL 50 CC Bilateral 08/10/2021    Performed by Marlinda Mike, MD at [facility] OR    REVISION RECONSTRUCTED BREAST Left 08/10/2021    Performed by Marlinda Mike, MD at [facility] OR    REPAIR INTERMEDIATE WOUND  2.5 CM OR LESS - TORSO abdominal dogear removal R side - 9cm, L side - 10cm N/A 08/10/2021    Performed by Marlinda Mike, MD at [facility] OR    COLONOSCOPY DIAGNOSTIC WITH SPECIMEN COLLECTION BY BRUSHING/ WASHING - FLEXIBLE N/A 06/23/2022    Performed by Buckles, Vinnie Level, MD at [facility] OR    ROBOT ASSISTED LEFT COLECTOMY, CONVERTED TO OPEN N/A 07/23/2022    Performed by Benetta Spar, MD at [facility] OR    ROBOT ASSISTED SURGERY N/A 07/23/2022    Performed by Benetta Spar, MD at [facility] OR    BREAST SURGERY      CARDIOVASCULAR STRESS TEST      COSMETIC SURGERY      ECHOCARDIOGRAM PROCEDURE      ELECTROCARDIOGRAM      TUNNELED VENOUS PORT PLACEMENT  July 2024     Family History   Problem Relation Name Age of Onset  Colon Polyps Mother Merri Brunette     Heart Attack Mother Merri Brunette     Diabetes Mother Merri Brunette     [facility] Mother Merri Brunette 50    Heart Disease Mother Merri Brunette     Hypertension Mother Merri Brunette     High Cholesterol Mother Merri Brunette     Sudden Cardiac Death Mother Merri Brunette     Cancer Mother Merri Brunette     Heart Disease Father Rockwell [place]     Hypothyroid Father Rockwell [place]     Hypertension Father Rockwell [place]     Thyroid Disease Father Rockwell [place]     High Cholesterol Father Rockwell [place]     Hypertension Brother Royal Hawthorn     Thyroid Disease Brother Royal Hawthorn     Cancer-Pancreas Maternal Elana Alm Podzimek 60    Diabetes Maternal Aunt Wilma Podzimek     Arthritis-rheumatoid Maternal Aunt Wilma Podzimek     Cancer-Breast Maternal Aunt Wilma Podzimek     Cancer-Ovarian Maternal Aunt Superior Podzimek     Diabetes Maternal Amie Critchley     Heart Disease Maternal Grandfather Jolaine Artist     Sudden Cardiac Death Maternal Bevelyn Ngo     Thyroid Disease Brother Magda Bernheim     Cancer-Breast Paternal Cousin  41    Cancer-Ovarian Maternal Aunt  50 - 59    Cancer-Breast Maternal Aunt Elnora 40 - 49    Cancer-Prostate Maternal Uncle Eldon 70    Cancer-Ovarian Maternal Uncle Eldon     Cancer-Ovarian Maternal Aunt Elnora      Social History     Socioeconomic History    Marital status: Married   Tobacco Use    Smoking status: Never     Passive exposure: Never    Smokeless tobacco: Never   Vaping Use    Vaping status: Never Used   Substance and Sexual Activity    Alcohol use: Yes     Alcohol/week: 1.0 standard drink of alcohol     Types: 1 Standard drinks or equivalent per week     Comment: socially    Drug use: Never    Sexual activity: Yes     Partners: Male     Birth control/protection: I.U.D.     Review of Systems    Objective:          carvediloL (COREG) 6.25 mg tablet Take one tablet by mouth twice daily with meals. Take with food.    CHOLEcalciferoL (vitamin D3) (OPTIMAL D3) 50,000 units capsule Take one capsule by mouth every 7 days. 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    escitalopram oxalate (LEXAPRO) 10 mg tablet Take one tablet by mouth daily.    ferrous sulfate (IRON) 325 mg (65 mg iron) tablet Take one tablet by mouth daily. Take on an empty stomach at least 1 hour before or 2 hours after food.    INTRAUTERINE DEVICE (IUD) IU by Intrauterine route.    levothyroxine (SYNTHROID) 50 mcg tablet Take one tablet by mouth daily.    lidocaine/prilocaine (EMLA) 2.5/2.5 % topical cream Apply pea sized amount of cream in a thick layer over port site 45 minutes prior to access, cover with transparent dressing    loperamide (IMODIUM  A-D) 2 mg capsule TAKE ONE CAPSULE BY MOUTH THREE TIMES DAILY, 30-45 MINUTES prior to meals    LORazepam (ATIVAN) 0.5 mg tablet Take 1 tab by mouth every 6 hrs as needed N/V not controlled by Zofran or Compazine. May also use every 6 hrs as needed anxiety or at bedtime insomnia.    losartan (COZAAR) 25 mg tablet Take one-half tablet by mouth daily.    metFORMIN (GLUCOPHAGE) 1,000 mg tablet Take one tablet by mouth daily with breakfast.    montelukast (SINGULAIR) 10 mg tablet Take one tablet by mouth at bedtime daily.    OLANZapine (ZYPREXA) 5 mg tablet Take one tablet by mouth at bedtime daily. Indications: nausea and vomiting caused by cancer drugs    ondansetron HCL (ZOFRAN) 8 mg tablet Take one tablet by mouth every 8 hours as needed for Nausea or Vomiting. Avoid on Days 1-2 of each chemotherapy cycle.    prochlorperazine maleate (COMPAZINE) 10 mg tablet Take one tablet by mouth every 6 hours as needed for Nausea or Vomiting.    rosuvastatin (CRESTOR) 5 mg tablet Take one tablet by mouth daily.    tamoxifen (NOLVADEX) 20 mg tablet TAKE 1 TABLET EVERY NIGHT AT BEDTIME    tirzepatide (MOUNJARO) 7.5 mg/0.5 mL injector PEN Inject 0.5 mL under the skin every 7 days. wednesday    traMADoL (ULTRAM) 50 mg tablet Take one tablet to two tablets by mouth every 6 hours as needed. Indications: pain     There were no vitals filed for this visit.  There is no height or weight on file to calculate BMI.     Pain Addressed:  N/A    Patient Evaluated for a Clinical Trial: Patient currently enrolled in a [place] treatment clinical trial.     [place] Cooperative Oncology Group performance status is 0, Fully active, able to carry on all pre-disease performance without restriction.Marland Kitchen     Physical Exam   Not performed due to telehealth     Assessment and Plan:  Post-Chemotherapy Surveillance  Completed 11 cycles of chemotherapy in January 2025. Recent concern about port malposition.   - Post treatment CT C/A/P scheduled for 03/22/2023 which will also assess port status.  - Continue monitoring neuropathy.    Incomplete Colonoscopy  Previous colonoscopy was incomplete due to partially obstructing tumor. No current pain or discomfort.  - Schedule completion colonoscopy during Spring Break 3/17-3/21/2025.  - Consider port removal during colonoscopy if port is confirmed to be in a non-usable position.  - Stop taking Mounjaro 7 days prior to colonoscopy.    Deedra Ehrich, MD  Colon and Rectal Surgery  Pager: [phone]    Total time 20 minutes.  Estimated counseling time 15 minutes.  Counseled regarding neuropathy, port management, and colon cancer surveillance.

## 2023-03-09 NOTE — Progress Notes
 Dr. Daphine Deutscher wants to do a colonoscopy and possible port removal on 3/19 as 5th case at [facility].   CNC tasked with this.     Brunilda Payor. Fredricka Bonine MSN, APRN, AGCNS-BC, AGPCNP-BC  Advanced Practice Provider  Colon and Rectal Surgery  Surgical Oncology  APP for Dr. Kathee Delton, Dr. Daphine Deutscher and Dr. Daiva Nakayama  Pager: [phone]  Available via Amie Critchley and [facility] [employer]

## 2023-03-09 NOTE — Progress Notes
Review of Systems   Constitutional: Negative.    HENT: Negative.     Eyes: Negative.    Respiratory: Negative.     Cardiovascular: Negative.    Gastrointestinal: Negative.    Endocrine: Negative.    Genitourinary: Negative.    Musculoskeletal: Negative.    Skin: Negative.    Allergic/Immunologic: Negative.    Neurological: Negative.    Hematological: Negative.    Psychiatric/Behavioral: Negative.

## 2023-03-10 ENCOUNTER — Encounter: Admit: 2023-03-10 | Discharge: 2023-03-10 | Payer: PRIVATE HEALTH INSURANCE

## 2023-03-10 ENCOUNTER — Ambulatory Visit: Admit: 2023-03-10 | Discharge: 2023-03-10 | Payer: PRIVATE HEALTH INSURANCE

## 2023-03-14 ENCOUNTER — Encounter: Admit: 2023-03-14 | Discharge: 2023-03-14 | Payer: PRIVATE HEALTH INSURANCE

## 2023-03-15 ENCOUNTER — Encounter: Admit: 2023-03-15 | Discharge: 2023-03-15 | Payer: PRIVATE HEALTH INSURANCE

## 2023-03-15 ENCOUNTER — Encounter: Admit: 2023-03-15 | Discharge: 2023-03-15 | Payer: 59

## 2023-03-15 DIAGNOSIS — C50411 Malignant neoplasm of upper-outer quadrant of right female breast: Secondary | ICD-10-CM

## 2023-03-16 ENCOUNTER — Encounter: Admit: 2023-03-16 | Discharge: 2023-03-16 | Payer: 59

## 2023-03-16 DIAGNOSIS — C186 Malignant neoplasm of descending colon: Secondary | ICD-10-CM

## 2023-03-16 MED ORDER — LIDOCAINE (PF) 10 MG/ML (1 %) IJ SOLN
2 mL | Freq: Once | SUBCUTANEOUS | 0 refills
Start: 2023-03-16 — End: ?

## 2023-03-16 MED ORDER — GOSERELIN 10.8 MG SC IMPL
10.8 mg | Freq: Once | SUBCUTANEOUS | 0 refills
Start: 2023-03-16 — End: ?

## 2023-03-16 NOTE — Telephone Encounter
 I called Jennifer Durham today as I saw she has not yet read her mychart message about 04/13/2023 surgery date. She confirmed dos and was advised that her pre-surgery education was sent via Mychart message on 2/17. She was advised my direct phone # can be found at bottom of this message and would ask she call us if any questions about the pre-surgery education. Her follow up visit was arranged for 4/16 and patient indicates she was aware of this visit. No further needs or questions at this time.

## 2023-03-17 ENCOUNTER — Encounter: Admit: 2023-03-17 | Discharge: 2023-03-17 | Payer: PRIVATE HEALTH INSURANCE

## 2023-03-21 ENCOUNTER — Encounter: Admit: 2023-03-21 | Discharge: 2023-03-21 | Payer: PRIVATE HEALTH INSURANCE

## 2023-03-22 ENCOUNTER — Encounter: Admit: 2023-03-22 | Discharge: 2023-03-22 | Payer: PRIVATE HEALTH INSURANCE

## 2023-03-22 ENCOUNTER — Encounter: Admit: 2023-03-22 | Discharge: 2023-03-22

## 2023-03-22 ENCOUNTER — Ambulatory Visit: Admit: 2023-03-22 | Discharge: 2023-03-22 | Payer: PRIVATE HEALTH INSURANCE

## 2023-03-22 DIAGNOSIS — C186 Malignant neoplasm of descending colon: Secondary | ICD-10-CM

## 2023-03-22 DIAGNOSIS — C50411 Malignant neoplasm of upper-outer quadrant of right female breast: Secondary | ICD-10-CM

## 2023-03-22 MED ORDER — LIDOCAINE (PF) 10 MG/ML (1 %) IJ SOLN
2 mL | Freq: Once | SUBCUTANEOUS | 0 refills | Status: DC
Start: 2023-03-22 — End: 2023-03-22

## 2023-03-22 MED ORDER — GOSERELIN 10.8 MG SC IMPL
10.8 mg | Freq: Once | SUBCUTANEOUS | 0 refills | Status: DC
Start: 2023-03-22 — End: 2023-03-22

## 2023-03-22 MED ORDER — GOSERELIN 10.8 MG SC IMPL
10.8 mg | Freq: Once | SUBCUTANEOUS | 0 refills | Status: CP
Start: 2023-03-22 — End: ?
  Administered 2023-03-22: 17:00:00 10.8 mg via SUBCUTANEOUS

## 2023-03-22 MED ORDER — LIDOCAINE (PF) 10 MG/ML (1 %) IJ SOLN
2 mL | Freq: Once | SUBCUTANEOUS | 0 refills | Status: CP
Start: 2023-03-22 — End: ?
  Administered 2023-03-22: 17:00:00 2 mL via SUBCUTANEOUS

## 2023-03-22 NOTE — Progress Notes
 Name: Jennifer Durham          MRN: [id number]      DOB: Dec 25, 1975      AGE: 48 y.o.   DATE OF SERVICE: 03/22/2023    Subjective:             Reason for Visit:  Follow Up      Jennifer Durham is a 48 y.o. female.      Cancer Staging   Colon cancer [facility])  Staging form: Colon And Rectum, AJCC 8th Edition  - Clinical stage from 07/23/2022: Stage IIIB (cT3, cN2a, cM0) - Signed by Miguel Rota, MD on 08/11/2022    Malignant neoplasm of upper-outer quadrant of right breast in female, estrogen receptor positive (HCC)  Staging form: Breast, AJCC 8th Edition  - Clinical stage from 07/04/2019: Stage IA (cT1c, cN0(f), cM0, G2, ER+, PR+, HER2-) - Signed by Guy Begin, PA-C on 07/12/2019  - Pathologic stage from 08/21/2019: Stage IA (pT1c, pN1a, cM0, G2, ER+, PR+, HER2-) - Signed by Guy Begin, PA-C on 08/30/2019      History of Present Illness    48 y.o. female with hx of stage IA ER/PR+, HER2- IDC of the right breast diagnosed in 06/2019 after detecting dimpling of her breast in 05/2019. She presented for mammogram which identified a spiculated mass in the upper right medial breast. She underwent ultrasound and biopsy that confirmed carcinoma . Pathology review at [facility] confirmed grade 2 IDC with a focal lobular growth pattern and lymphoid tissue with no evidence of carcinoma in the right axillary specimen. She was recommended to undergo upfront surgery. On 08/01/2019, she underwent bilateral skin sparing mastectomy and right sentinel lymph node biopsy, axilla lymph node dissection, and reconstruction.  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  A 5 mm focus of metastatic carcinoma was found in 1 of 4 sentinel lymph nodes.  0 of 27 additional axillary nodes were involved. pT1c N1a M0 (1 of 34 LN+) M0. No LVI, No EIC, No ENE. She was recommended to receive adjuvant chemotherapy with TC x4 cycles,  underwent adjuvant radiotherapy to her right reconstructed breast, chest wall and regional nodes to a dose of 4526 cGy in 16 fractions from 02/19/2020 - 03/11/2020.      She is on tamoxifen and zoladex.     history of hypertension, polycystic ovarian disease, and prediabetesa colonoscopy     She had a colonoscopy performed on May 29th, 2024. The colonoscopy revealed a moderately differentiated adenocarcinoma suspected to be at the splenic flexure, 60 cm from the anal verge, and a large rectosigmoid polyp described as pedunculated. The polyp was biopsied but not removed and was identified as a tubulovillous adenoma. CT of the chest, abdomen, and pelvis, which showed no evidence of metastatic disease. The patient's CEA level was 0.8, and she was found to be microsatellite stable.     07/23/2022  Extended left hemi colectomy: pT3N2 (LN 5/48) mod diff adenocarcinoma, Lymphatic and / or Vascular Invasion: Present; Perineural Invasion--Present     Interval History,     RTC for follow up with new images and labs.  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 No F/C/N/V/D, good appetite, good energy level     Review of Systems   Constitutional:  Positive for fatigue. Negative for activity change, appetite change, chills, diaphoresis, fever and unexpected weight change.   HENT:  Negative  for facial swelling, mouth sores, sore throat and trouble swallowing.    Eyes: Negative.    Respiratory: Negative.  Negative for cough, chest tightness, shortness of breath and wheezing.    Cardiovascular: Negative.  Negative for chest pain, palpitations and leg swelling.   Gastrointestinal:  Positive for constipation. Negative for abdominal distention, abdominal pain, anal bleeding, blood in stool, diarrhea, nausea, rectal pain and vomiting.   Endocrine: Positive for cold intolerance.   Genitourinary: Negative.    Musculoskeletal: Negative.  Negative for arthralgias, back pain, neck pain and neck stiffness.   Skin: Negative. Negative for color change, pallor, rash and wound.   Neurological:  Positive for numbness. Negative for dizziness, weakness, light-headedness and headaches.   Psychiatric/Behavioral: Negative.          Past Medical History:    Allergy    Anxiety    Anxiety disorder    Breast cancer (HCC)    Cancer of colon (HCC)    Depression    Depressive disorder, not elsewhere classified    Heartburn    Hx of radiation therapy    Hyperlipemia    Hypertension    Hypothyroidism    Insulin resistance    Limb alert care status    Obesity    Other malignant neoplasm without specification of site    PCOS (polycystic ovarian syndrome)    Personal history of irradiation    Seasonal allergies    Tachyarrhythmia     Surgical History:   Procedure Laterality Date    HX TONSILLECTOMY  2001    turbinates/adenoids 2015    HX TONSIL AND ADENOIDECTOMY  2011    LEFT PERONEAL TENDON REPAIR Left 12/15/2016    Performed by Tanja Port, MD at [facility] OR    Left Skin Sparing Mastectomy Left 08/21/2019    Performed by Renford Dills, MD at [facility] OR    IDENTIFICATION SENTINEL LYMPH NODE Right 08/21/2019    Performed by Renford Dills, MD at [facility] OR    INJECTION RADIOACTIVE TRACER FOR SENTINEL NODE IDENTIFICATION Right 08/21/2019    Performed by Renford Dills, MD at [facility] OR    RADIOLOGICAL EXAM SURGICAL SPECIMEN Right 08/21/2019    Performed by Renford Dills, MD at [facility] OR    MASTECTOMY - MODIFIED RADICAL WITH AXILLARY LYMPH NODE DISSECTION Right 08/21/2019    Performed by Renford Dills, MD at [facility] OR    RECONSTRUCTION BREAST WITH TISSUE EXPANDER AND SUBSEQUENT EXPANSION - IMMEDIATE/ DELAYED Bilateral 08/21/2019    Performed by Marlinda Mike, MD at [facility] OR    IMPLANTATION BIOLOGIC IMPLANT FOR SOFT TISSUE REINFORCEMENT Bilateral 08/21/2019    Performed by Marlinda Mike, MD at [facility] OR    RECONSTRUCTION BREAST WITH DEEP INFERIOR EPIGASTRIC PERFORATOR FLAP/ SUPERFICIAL INFERIOR EPIGASTRIC ARTERY FLAP Bilateral 07/02/2020    Performed by Marlinda Mike, MD at [facility] OR    RECONSTRUCTION BREAST WITH FREE FLAP - request 22 modifier, complicated procedure due to obesity and intramuscular course Bilateral 07/02/2020    Performed by Marlinda Mike, MD at [facility] OR    INTRAVENOUS INJECTION AGENT TO TEST VASCULAR FLOW IN FLAP/ GRAFT Bilateral 07/02/2020    Performed by Marlinda Mike, MD at [facility] OR    APPLICATION NEGATIVE PRESSURE WOUND THERAPY Bilateral 07/02/2020    Performed by Marlinda Mike, MD at [facility] OR    IMPLANTATION MESH CLOSURE OF DEBRIDEMENT FOR NECROTIZING SOFT TISSUE INFECTION Bilateral 07/02/2020    Performed by Fran Lowes,  Stevphen Meuse, MD at [facility] OR    REMOVAL TISSUE EXPANDER Bilateral 07/02/2020    Performed by Marlinda Mike, MD at [facility] OR    INCISION AND DRAINAGE HEMATOMA/ SEROMA - TORSO N/A 08/05/2020    Performed by Marlinda Mike, MD at [facility] OR    REPAIR INTERMEDIATE WOUND 12.6 CM TO 20.0 CM - TRUNK - total length 24cm  08/05/2020    Performed by Marlinda Mike, MD at [facility] OR    GRAFTING AUTOLOGOUS FAT BY LIPOSUCTION TO TRUNK/ BREASTS/ SCALP/ ARMS/ LEGS - 50 CC OR LESS 60cc to the R, 90cc to the L Bilateral 08/10/2021    Performed by Marlinda Mike, MD at [facility] OR    GRAFTING AUTOLOGOUS FAT BY LIPOSUCTION TO TRUNK/ BREASTS/ SCALP/ ARMS/ LEGS - EACH ADDITIONAL 50 CC Bilateral 08/10/2021    Performed by Marlinda Mike, MD at [facility] OR    REVISION RECONSTRUCTED BREAST Left 08/10/2021    Performed by Marlinda Mike, MD at [facility] OR    REPAIR INTERMEDIATE WOUND  2.5 CM OR LESS - TORSO abdominal dogear removal R side - 9cm, L side - 10cm N/A 08/10/2021    Performed by Marlinda Mike, MD at [facility] OR    COLONOSCOPY DIAGNOSTIC WITH SPECIMEN COLLECTION BY BRUSHING/ WASHING - FLEXIBLE N/A 06/23/2022    Performed by Buckles, Vinnie Level, MD at [facility] OR    ROBOT ASSISTED LEFT COLECTOMY, CONVERTED TO OPEN N/A 07/23/2022    Performed by Benetta Spar, MD at [facility] OR    ROBOT ASSISTED SURGERY N/A 07/23/2022    Performed by Benetta Spar, MD at [facility] OR    BREAST SURGERY      CARDIOVASCULAR STRESS TEST      COSMETIC SURGERY      ECHOCARDIOGRAM PROCEDURE      ELECTROCARDIOGRAM      TUNNELED VENOUS PORT PLACEMENT  July 2024     Family History   Problem Relation Name Age of Onset    Colon Polyps Mother Merri Brunette     Heart Attack Mother Merri Brunette     Diabetes Mother Merri Brunette     [facility] Mother Merri Brunette 50    Heart Disease Mother Merri Brunette     Hypertension Mother Merri Brunette     High Cholesterol Mother Merri Brunette     Sudden Cardiac Death Mother Merri Brunette     Cancer Mother Merri Brunette     Heart Disease Father [place]     Hypothyroid Father [place]well [place]     Hypertension Father [place]well [place]     Thyroid Disease Father Rockwell [place]     High Cholesterol Father [place]well [place]     Hypertension Brother Royal Hawthorn     Thyroid Disease Brother Royal Hawthorn     [facility] Maternal Elana Alm Podzimek 60    Diabetes Maternal Aunt [place] Podzimek     Arthritis-rheumatoid Maternal Aunt Wilma Podzimek     Cancer-Breast Maternal Aunt Wilma Podzimek     Cancer-Ovarian Maternal Aunt [place] Podzimek     Diabetes Maternal Hart Carwin [place]     Heart Disease Maternal Grandfather Jolaine Artist     Sudden Cardiac Death Maternal Bevelyn Ngo     Thyroid Disease Brother Magda Bernheim     Cancer-Breast Paternal Cousin  45    Cancer-Ovarian Maternal Aunt  50 - 59    Cancer-Breast Maternal Aunt Elnora 40 - 49    Cancer-Prostate Maternal Uncle  Eldon 70    Cancer-Ovarian Maternal Uncle Eldon     Cancer-Ovarian Maternal Aunt Elnora      Social History     Socioeconomic History    Marital status: Married   Tobacco Use    Smoking status: Never     Passive exposure: Never    Smokeless tobacco: Never   Vaping Use    Vaping status: Never Used   Substance and Sexual Activity    Alcohol use: Yes     Alcohol/week: 1.0 standard drink of alcohol     Types: 1 Standard drinks or equivalent per week     Comment: socially    Drug use: Never    Sexual activity: Yes     Partners: Male     Birth control/protection: I.U.D.     Vaping/E-liquid Use    Vaping Use Never User Objective:          carvediloL (COREG) 6.25 mg tablet Take one tablet by mouth twice daily with meals. Take with food.    CHOLEcalciferoL (vitamin D3) (OPTIMAL D3) 50,000 units capsule Take one capsule by mouth every 7 days. 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    escitalopram oxalate (LEXAPRO) 10 mg tablet Take one tablet by mouth daily.    ferrous sulfate (IRON) 325 mg (65 mg iron) tablet Take one tablet by mouth daily. Take on an empty stomach at least 1 hour before or 2 hours after food.    INTRAUTERINE DEVICE (IUD) IU by Intrauterine route.    levothyroxine (SYNTHROID) 50 mcg tablet Take one tablet by mouth daily.    lidocaine/prilocaine (EMLA) 2.5/2.5 % topical cream Apply pea sized amount of cream in a thick layer over port site 45 minutes prior to access, cover with transparent dressing    loperamide (IMODIUM A-D) 2 mg capsule TAKE ONE CAPSULE BY MOUTH THREE TIMES DAILY, 30-45 MINUTES prior to meals    LORazepam (ATIVAN) 0.5 mg tablet Take 1 tab by mouth every 6 hrs as needed N/V not controlled by Zofran or Compazine. May also use every 6 hrs as needed anxiety or at bedtime insomnia.    losartan (COZAAR) 25 mg tablet Take one-half tablet by mouth daily.    metFORMIN (GLUCOPHAGE) 1,000 mg tablet Take one tablet by mouth daily with breakfast.    montelukast (SINGULAIR) 10 mg tablet Take one tablet by mouth at bedtime daily.    OLANZapine (ZYPREXA) 5 mg tablet Take one tablet by mouth at bedtime daily. Indications: nausea and vomiting caused by cancer drugs    ondansetron HCL (ZOFRAN) 8 mg tablet Take one tablet by mouth every 8 hours as needed for Nausea or Vomiting. Avoid on Days 1-2 of each chemotherapy cycle.    prochlorperazine maleate (COMPAZINE) 10 mg tablet Take one tablet by mouth every 6 hours as needed for Nausea or Vomiting.    rosuvastatin (CRESTOR) 5 mg tablet Take one tablet by mouth daily.    tamoxifen (NOLVADEX) 20 mg tablet TAKE 1 TABLET EVERY NIGHT AT BEDTIME    tirzepatide (MOUNJARO) 7.5 mg/0.5 mL injector PEN Inject 0.5 mL under the skin every 7 days. wednesday    traMADoL (ULTRAM) 50 mg tablet Take one tablet to two tablets by mouth every 6 hours as needed. Indications:  pain     Vitals:    03/22/23 1040   BP: 106/72   BP Source: Arm, Left Upper   Pulse: 93   Temp: 36.3 ?C (97.3 ?F)   SpO2: 98%   TempSrc: Temporal   PainSc: Zero   Weight: 124.4 kg (274 lb 3.2 oz)         Body mass index is 37.19 kg/m?Marland Kitchen     Pain Score: Zero       Fatigue Scale: 4    Pain Addressed:  N/A    Patient Evaluated for a Clinical Trial: Patient currently in screening for a treatment clinical trial.     Eastern Cooperative Oncology Group performance status is 0, Fully active, able to carry on all pre-disease performance without restriction.Marland Kitchen     Physical Exam  Vitals reviewed.   Constitutional:       General: She is not in acute distress.     Appearance: Normal appearance. She is well-developed. She is not ill-appearing, toxic-appearing or diaphoretic.   HENT:      Head: Normocephalic and atraumatic.      Nose: Nose normal. No rhinorrhea.      Mouth/Throat:      Mouth: Mucous membranes are moist. Mucous membranes are not pale. No oral lesions.      Pharynx: Oropharynx is clear. No oropharyngeal exudate or posterior oropharyngeal erythema.      Tonsils: No tonsillar abscesses.   Eyes:      General: No scleral icterus.        Right eye: No discharge.         Left eye: No discharge.      Extraocular Movements: Extraocular movements intact.      Conjunctiva/sclera: Conjunctivae normal.      Pupils: Pupils are equal, round, and reactive to light.   Cardiovascular:      Rate and Rhythm: Normal rate and regular rhythm.      Pulses: Normal pulses.      Heart sounds: Normal heart sounds. No murmur heard.     No gallop.   Pulmonary:      Effort: Pulmonary effort is normal. No respiratory distress.      Breath sounds: Normal breath sounds. No stridor. No wheezing, rhonchi or rales.   Chest:      Chest wall: No tenderness.   Abdominal:      General: Abdomen is flat. Bowel sounds are normal. There is no distension.      Palpations: Abdomen is soft. There is no mass.      Tenderness: There is no abdominal tenderness. There is no guarding or rebound.      Hernia: No hernia is present.   Musculoskeletal:         General: No swelling, tenderness, deformity or signs of injury. Normal range of motion.      Cervical back: Normal range of motion and neck supple. No rigidity. No muscular tenderness.      Right lower leg: No edema.      Left lower leg: No edema.   Lymphadenopathy:      Cervical: No cervical adenopathy.   Skin:     General: Skin is warm and dry.      Coloration: Skin is not jaundiced or pale.      Findings: No bruising, erythema, lesion or rash.   Neurological:      General: No focal deficit present.      Mental Status: She is alert and oriented to person, place,  and time. Mental status is at baseline.      Cranial Nerves: No cranial nerve deficit.      Motor: No weakness.      Coordination: Coordination normal.      Gait: Gait normal.   Psychiatric:         Mood and Affect: Mood normal.         Behavior: Behavior normal.         Thought Content: Thought content normal.         Judgment: Judgment normal.          07/23/2022  Pathology    Final Diagnosis:     A. 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      B. Colon, extended left hemi colectomy:    Adenocarcinoma moderately differentiated. See comment.   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 Additional proximal margin#1, excision:   Colon tissue with Tubular adenomas. See comment. Three lymph nodes negative for metastatic carcinoma (0/3).     D. Additional proximal margins #2, excision:    Tubulovillous adenoma with focal high-grade dysplasia   Eleven lymph nodes negative for metastatic carcinoma (0/11).       Comment:   Pursuant to the Quality Assurance Program at the [facility] Pathology Department, selected slides from this case have been   concurrently reviewed by the following pathologist: Dr. Juanetta Beets   who   agrees with the final diagnosis.       CASE SUMMARY: (COLON AND RECTUM: Resection)   Standard(s): AJCC-UICC 8   CAP Version: Colon and Rectum Resection 4.3.0.0     SPECIMEN     Procedure   ___ Subtotal (extended) left abdominal colectomy     Macroscopic Evaluation of Mesorectum (required for rectal cancers)   ___ Not applicable     TUMOR     Tumor Site (select all that apply)   ___ Transverse colon/Splenic flexure     Histologic Type   ___ Adenocarcinoma     Histologic Grade   ___ G2, moderately differentiated     Tumor Size   ___ Greatest dimension in Centimeters (cm): 4.0 x 3.8 x 0.7 cm     Multiple Primary Sites (e.g., hepatic flexure and transverse colon)   ___ Not applicable (no additional primary site(s) present)     Tumor Extent   ___ Invades through muscularis propria into the pericolonic or perirectal   tissue     Sub-mucosal Invasion (required only for pT1 tumors)   ___ Not applicable (not a pT1 tumor)     Macroscopic Tumor Perforation   ___ Not identified     Lymphatic and / or Vascular Invasion(select all that apply)   ___ Present (not otherwise specified)     Perineural Invasion   ___ Present     Tumor Budding Score   ___ Intermediate (5-9)     Treatment Effect   ___ No known presurgical therapy     MARGINS     Margin Status for Invasive Carcinoma   ___ All margins negative for invasive carcinoma   +Closest Margin(s) to Invasive Carcinoma (select all that apply)   ___ Radial (circumferential)/Mesenteric   +Distance from Invasive Carcinoma to Closest Margin   Specify in Centimeters (cm)   ___ Exact distance in cm:  3.4 cm   Distance from Invasive Carcinoma to Radial (Circumferential) Margin   (required for rectal   tumors)   ___ Distance already reported as closest margin.   +Distance from Invasive Carcinoma to Distal Margin (recommended for rectal   tumors)   Specify in Centimeters (cm)   ___ Exact distance in cm: 58.0 cm   Margin(s) Involved by Invasive Carcinoma (select all that apply)   ___ Not applicable     Margin Status for Non-Invasive Tumor (select all that apply)   ___ All margins negative for high-grade dysplasia / intramucosal carcinoma   and low-grade dysplasia     REGIONAL LYMPH NODES     Regional Lymph Node Status   ___ Regional lymph nodes present   ___ Tumor present in regional lymph nodes   Number of Lymph Nodes with Tumor   ___ Exact number (specify): 5   Number of Lymph Nodes Examined   ___ Exact number (specify): 34     Tumor Deposits   ___ Not identified     DISTANT METASTASIS     Distant Site(s) Involved, if applicable (select all that apply)   ___ Not applicable     pTNM CLASSIFICATION (AJCC 8th Edition)   Reporting of pT, pN, and (when applicable) pM categories is based on   information available to the pathologist at the time the report is issued.   As per the AJCC (Chapter 1, 8th Ed.) it is the managing physician's   responsibility to establish the final pathologic stage based upon all   pertinent information, including but potentially not limited to this   pathology report.     Modified Classification (required only if applicable) (select all that   apply)   ___ Not applicable     pT Category   ___ pT3: Tumor invades through the muscularis propria into pericolorectal   tissues     T Suffix (required only if applicable)   ___ Not applicable     pN Category   pN2: Four or more regional nodes are positive   ___ pN2a: Four to six regional lymph nodes are positive     pM Category (required only if confirmed pathologically)   ___ Not applicable - pM cannot be determined from the submitted   specimen(s)     ADDITIONAL FINDINGS     +Additional Findings (select all that apply)   ___ Adenoma(s) (Tubulovillous adenoma with high grade dysplasia, 6.3 cm.   1.9 cm., rectosigmoid; tubular adenomas)      Latest Reference Range & Units 03/22/23 07:33   Hemoglobin 12.0 - 15.0 g/dL 46.9   Hematocrit 62.9 - 45.0 % 36.1   Platelet Count 150 - 400 10*3/uL 247   White Blood Cells 4.50 - 11.00 10*3/uL 4.90   Neutrophils 41 - 77 % 60   Absolute Neutrophil Count 1.80 - 7.00 10*3/uL 2.90   Lymphocytes 24 - 44 % 27   Absolute Lymph Count 1.00 - 4.80 10*3/uL 1.30   Monocytes 4 - 12 % 8   Absolute Monocyte Count 0.00 - 0.80 10*3/uL 0.40   Eosinophils 0 - 5 % 4   Absolute Eosinophil Count 0.00 - 0.45 10*3/uL 0.20   Absolute Basophil Count 0.00 - 0.20 10*3/uL 0.10   Basophils 0 - 2 % 1   RBC 4.00 - 5.00 10*6/uL 4.19   MCV 80.0 - 100.0 fL 86.2   MCH 26.0 - 34.0 pg 28.6   MCHC 32.0 - 36.0 g/dL 52.8   MPV 7.0 -  11.0 fL 7.6   RDW 11.0 - 15.0 % 14.9   Sodium 137 - 147 mmol/L 140   Potassium 3.5 - 5.1 mmol/L 3.9   Chloride 98 - 110 mmol/L 104   CO2 21 - 30 mmol/L 27   Anion Gap 3 - 12  9   Blood Urea Nitrogen 7 - 25 mg/dL 11   Creatinine 1.61 - 1.00 mg/dL 0.96   Glomerular Filtration Rate (GFR) >60 mL/min >60   Glucose 70 - 100 mg/dL 98   Albumin 3.5 - 5.0 g/dL 4.3   Calcium 8.5 - 04.5 mg/dL 9.3   Total Bilirubin 0.2 - 1.3 mg/dL 0.6   Total Protein 6.0 - 8.0 g/dL 7.3   AST (SGOT) 7 - 40 U/L 24   ALT (SGPT) 7 - 56 U/L 10   Alk Phosphatase 25 - 110 U/L 42     CT 03/22/2023 CT  CHEST:     No thoracic metastatic disease.     ABDOMEN AND PELVIS:   1.  Extended partial colectomy. No abdominopelvic lymphadenopathy or   ascites.     2. Development of mild wall thickening of the remnant ascending colon   upstream of the anastomosis, which may represent infectious or   inflammatory colitis.     3.  Evolving fat necrosis in the right hemiabdomen.       Assessment and Plan:    48 yr old female with history of right breast cancer (07/2019): ER/PR+, HER2- IDC pT1c N1a M0 (1 of 34 LN+) M0.  on  tamoxifen and zoladex. Who has newly diagnosed left colon adenocarcinoma s/P surgery: pT3N2 (LN 5/48) mod diff , positive for  Lymphatic and / or Vascular Invasion, and Perineural Invasion.     We discussed the study of ctDNA (C-14),  on it now      Adjuvant chemo wht FOLFOX --received 11 cycles, then stopped because of neuropathy    Images -- NED  Lab no issues    Explaned images and lab to the pt,     -- cont surveillance  -- cont follow with Dr. Sallyanne Havers and with colonoscopy (scheduled)  -- Continue follow breast cancer care team, last seen 01/04/23  -- Continue supportive care.   -- Pt to call with any questions, concerns or symptoms at any time and we can see her sooner.         Miguel Rota, MD, [facility]

## 2023-03-22 NOTE — Patient Instructions
 Dr. Miguel Rota and Lianne Moris, APRN-NP  Nurses: Lucillie Garfinkel, BSN, RN & Erenest Rasher, BSN, RN  Phone: [phone]   Fax: [phone]  Available Monday - Friday 8:00 am - 4:00 pm    SCHEDULING NEEDS: [phone]    On-call number for urgent needs outside of business hours: [phone]  Ask for the on-call Oncology fellow to be paged and they will call you back.    Medication refills: Please contact your pharmacy FIRST for medication refills; if they do not have any refills on file, they will contact the office. Make sure they have our correct contact information on file.  P: [phone], F: [phone]    MyChart Messages: All messages come to the nurse and we discuss with Dr. Wynelle Link and Lianne Moris, APRN. Please make sure to select Dr. Wynelle Link or Denny Peon when sending a message. Messages are answered 8:00 am - 4:00 pm Monday - Friday, holidays excluded. Please keep in mind we have 4 hours to respond to your messages.    Phone Calls: Our phone number is strictly a Camera operator. We check these messages all day and will get back with you. Please state your full name, date of birth, and reason for your call with as much detail as possible. Please keep in mind we have 4 hours to return your call.    FMLA/Insurance/Disability paperwork: Please fill out as much of the paperwork as you are able. We request that you give [place] 7-10 business days to complete this as we have a high volume of paperwork to complete for our patients.

## 2023-03-22 NOTE — Progress Notes
 Patient received Zoladex and tolerated without difficulty.  No pertinent changes since last assessment.

## 2023-04-07 ENCOUNTER — Encounter: Admit: 2023-04-07 | Discharge: 2023-04-07

## 2023-04-12 ENCOUNTER — Ambulatory Visit: Admit: 2023-04-12 | Discharge: 2023-04-12 | Payer: PRIVATE HEALTH INSURANCE

## 2023-04-13 ENCOUNTER — Encounter: Admit: 2023-04-13 | Discharge: 2023-04-13 | Payer: PRIVATE HEALTH INSURANCE

## 2023-04-13 ENCOUNTER — Ambulatory Visit: Admit: 2023-04-13 | Discharge: 2023-04-13 | Payer: PRIVATE HEALTH INSURANCE

## 2023-04-13 MED ORDER — PROPOFOL INJ 10 MG/ML IV VIAL
INTRAVENOUS | 0 refills | Status: DC
Start: 2023-04-13 — End: 2023-04-13

## 2023-04-13 MED ORDER — DEXMEDETOMIDINE IN 0.9 % NACL 80 MCG/20 ML (4 MCG/ML) IV SOLN
INTRAVENOUS | 0 refills | Status: DC
Start: 2023-04-13 — End: 2023-04-13

## 2023-04-13 MED ORDER — PHENYLEPHRINE HCL IN 0.9% NACL 1 MG/10 ML (100 MCG/ML) IV SYRG
INTRAVENOUS | 0 refills | Status: DC
Start: 2023-04-13 — End: 2023-04-13

## 2023-04-13 MED ORDER — CEFAZOLIN 1 GRAM IJ SOLR
INTRAVENOUS | 0 refills | Status: DC
Start: 2023-04-13 — End: 2023-04-13

## 2023-04-13 MED ORDER — FENTANYL CITRATE (PF) 50 MCG/ML IJ SOLN
INTRAVENOUS | 0 refills | Status: DC
Start: 2023-04-13 — End: 2023-04-13

## 2023-04-13 MED ORDER — PROPOFOL 10 MG/ML IV EMUL 50 ML (INFUSION)(AM)(OR)
INTRAVENOUS | 0 refills | Status: DC
Start: 2023-04-13 — End: 2023-04-13

## 2023-04-13 MED ORDER — LIDOCAINE (PF) 20 MG/ML (2 %) IJ SOLN
INTRAVENOUS | 0 refills | Status: DC
Start: 2023-04-13 — End: 2023-04-13

## 2023-04-13 MED ORDER — MIDAZOLAM 1 MG/ML IJ SOLN
INTRAVENOUS | 0 refills | Status: DC
Start: 2023-04-13 — End: 2023-04-13

## 2023-04-13 MED FILL — OXYCODONE 5 MG PO TAB: 5 mg | ORAL | 2 days supply | Qty: 5 | Fill #1 | Status: CP

## 2023-04-14 ENCOUNTER — Encounter: Admit: 2023-04-14 | Discharge: 2023-04-14 | Payer: PRIVATE HEALTH INSURANCE

## 2023-04-20 ENCOUNTER — Encounter: Admit: 2023-04-20 | Discharge: 2023-04-20 | Payer: PRIVATE HEALTH INSURANCE

## 2023-04-20 MED ORDER — CARVEDILOL 6.25 MG PO TAB
6.25 mg | ORAL_TABLET | Freq: Two times a day (BID) | ORAL | 3 refills | 90.00000 days | Status: AC
Start: 2023-04-20 — End: ?

## 2023-05-05 ENCOUNTER — Encounter: Admit: 2023-05-05 | Discharge: 2023-05-05

## 2023-05-05 DIAGNOSIS — Z006 Encounter for examination for normal comparison and control in clinical research program: Secondary | ICD-10-CM

## 2023-05-05 DIAGNOSIS — C189 Malignant neoplasm of colon, unspecified: Secondary | ICD-10-CM

## 2023-05-10 NOTE — Patient Instructions
Thank you for visiting me today. If you have questions or needs following our visit, please feel free to send my team a MyChart message or give my nurse a call. I value your needs and to ensure your needs are met while I am operating, I work with a team. My team consists of my nurse practitioner Deberah Pelton), and my nurse, Penni Homans). They assist with reviewing and answering messages Monday through Friday 8am to 4pm. If it is after 4pm, your message may be returned the following business day. Our office is closed on weekends and major holidays. If you have an urgent need after hours, please call [phone] and ask for the surgical oncology resident on call to be paged.      If testing was ordered today please know it may automatically be released to your mychart. You may review this before we do.     Please feel free call my nurse Yitzel Shasteen at [phone] with any questions.     Thank you for trusting our team with your care,      Dr. Deedra Ehrich MD  Deberah Pelton MSN, APRN, AGCNS-BC, AGPCNP-BC  Einar Pheasant BSN, RN  Surgical Oncology / Colorectal Surgery  [address] [place], [place], [place] [address]  Phone: [phone] / Fax: [phone]

## 2023-05-11 ENCOUNTER — Encounter: Admit: 2023-05-11 | Discharge: 2023-05-11 | Payer: PRIVATE HEALTH INSURANCE

## 2023-05-11 ENCOUNTER — Ambulatory Visit: Admit: 2023-05-11 | Discharge: 2023-05-12 | Payer: PRIVATE HEALTH INSURANCE

## 2023-05-11 DIAGNOSIS — C189 Malignant neoplasm of colon, unspecified: Secondary | ICD-10-CM

## 2023-05-11 NOTE — Progress Notes
 Date of Service: 05/11/2023    Subjective:          History of Present Illness  Jennifer Durham is a 48 year old female with a history of breast cancer S/P bilateral DIEP flaps, PCOS, and insulin  resistance who underwent a screening colonoscopy 06/23/22 during which a partially obstructing MSS moderately differentiated splenic flexure adenocarcinoma was found which could not be traversed. 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 She underwent a robotic converted to open subtotal colectomy with Deloyer's maneuver and splenic flexure mobilization on 07/23/2022. Final path stage 3, pT3N2a. She completed 12 cycles of adjuvant FOLFOX in early January     Here for a follow up visit for colon adenocarcinoma. Had colonoscopy 3/19. Has been doing well since then.    Noticed a raised area above her belly button a couple of months ago. Thought it could be bloating or a hernia. Hurts a little to press on but not at baseline. Does not ever reduce in size.    Has bouts of urgency and diarrhea every day. Takes imodium  which helps a little, but doesn't take it unless she has something major going on. Wants her body to get used to its new functionality and has a bathroom right across the hall at work.     No pain, nausea, vomiting, or constipation. She maintains her nutrition by eating small meals throughout the day.    She has lost a significant amount of weight, approximately 80 pounds, attributed to a combination of factors including diet, medication changes, and previous chemotherapy. She completed six months of chemotherapy in January 2025.    She is participating in a study involving circulating tumor DNA, although she is not aware of the specific results. Her follow-up care includes regular scans and lab work ordered by her medical oncologist, Dr. Paulene Boron.     Past Medical History:    Allergy    Anxiety    Anxiety disorder    Breast cancer (CMS-HCC)    Cancer of colon (CMS-HCC)    Depression Depressive disorder, not elsewhere classified    Difficult intravenous access    Heartburn    Hx of radiation therapy    Hyperlipemia    Hypertension    Hypothyroidism    Insulin  resistance    Limb alert care status    Obesity    Other malignant neoplasm without specification of site    PCOS (polycystic ovarian syndrome)    Personal history of irradiation    Seasonal allergies    Tachyarrhythmia     Surgical History:   Procedure Laterality Date    HX TONSILLECTOMY  2001    turbinates/adenoids 2015    HX TONSIL AND ADENOIDECTOMY  2011    LEFT PERONEAL TENDON REPAIR Left 12/15/2016    Performed by Velta Gianotti, MD at [facility] OR    Left Skin Sparing Mastectomy Left 08/21/2019    Performed by Wing Hauser, MD at [facility] OR    IDENTIFICATION SENTINEL LYMPH NODE Right 08/21/2019    Performed by Kilgore, Lyndsey J, MD at [facility] OR    INJECTION RADIOACTIVE TRACER FOR SENTINEL NODE IDENTIFICATION Right 08/21/2019    Performed by Kilgore, Lyndsey J, MD at [facility] OR    RADIOLOGICAL EXAM SURGICAL SPECIMEN Right 08/21/2019    Performed by Kilgore, Lyndsey J, MD at [facility] OR    MASTECTOMY - MODIFIED RADICAL WITH AXILLARY LYMPH NODE DISSECTION Right 08/21/2019    Performed by Kilgore, Lyndsey J, MD at [facility]  OR    RECONSTRUCTION BREAST WITH TISSUE EXPANDER AND SUBSEQUENT EXPANSION - IMMEDIATE/ DELAYED Bilateral 08/21/2019    Performed by Barabara Boning, MD at [facility] OR    IMPLANTATION BIOLOGIC IMPLANT FOR SOFT TISSUE REINFORCEMENT Bilateral 08/21/2019    Performed by Barabara Boning, MD at [facility] OR    RECONSTRUCTION BREAST WITH DEEP INFERIOR EPIGASTRIC PERFORATOR FLAP/ SUPERFICIAL INFERIOR EPIGASTRIC ARTERY FLAP Bilateral 07/02/2020    Performed by Barabara Boning, MD at [facility] OR    RECONSTRUCTION BREAST WITH FREE FLAP - request 22 modifier, complicated procedure due to obesity and intramuscular course Bilateral 07/02/2020    Performed by Barabara Boning, MD at [facility] OR    INTRAVENOUS INJECTION AGENT TO TEST VASCULAR FLOW IN FLAP/ GRAFT Bilateral 07/02/2020    Performed by Barabara Boning, MD at [facility] OR    APPLICATION NEGATIVE PRESSURE WOUND THERAPY Bilateral 07/02/2020    Performed by Barabara Boning, MD at [facility] OR    IMPLANTATION MESH CLOSURE OF DEBRIDEMENT FOR NECROTIZING SOFT TISSUE INFECTION Bilateral 07/02/2020    Performed by Barabara Boning, MD at [facility] OR    REMOVAL TISSUE EXPANDER Bilateral 07/02/2020    Performed by Barabara Boning, MD at [facility] OR    INCISION AND DRAINAGE HEMATOMA/ SEROMA - TORSO N/A 08/05/2020    Performed by Barabara Boning, MD at [facility] OR    REPAIR INTERMEDIATE WOUND 12.6 CM TO 20.0 CM - TRUNK - total length 24cm  08/05/2020    Performed by Barabara Boning, MD at [facility] OR    GRAFTING AUTOLOGOUS FAT BY LIPOSUCTION TO TRUNK/ BREASTS/ SCALP/ ARMS/ LEGS - 50 CC OR LESS 60cc to the R, 90cc to the L Bilateral 08/10/2021    Performed by Barabara Boning, MD at [facility] OR    GRAFTING AUTOLOGOUS FAT BY LIPOSUCTION TO TRUNK/ BREASTS/ SCALP/ ARMS/ LEGS - EACH ADDITIONAL 50 CC Bilateral 08/10/2021    Performed by Barabara Boning, MD at [facility] OR    REVISION RECONSTRUCTED BREAST Left 08/10/2021    Performed by Barabara Boning, MD at [facility] OR    REPAIR INTERMEDIATE WOUND  2.5 CM OR LESS - TORSO abdominal dogear removal R side - 9cm, L side - 10cm N/A 08/10/2021    Performed by Barabara Boning, MD at [facility] OR    COLONOSCOPY DIAGNOSTIC WITH SPECIMEN COLLECTION BY BRUSHING/ WASHING - FLEXIBLE N/A 06/23/2022    Performed by Buckles, Tino Foreman, MD at [facility] OR    ROBOT ASSISTED LEFT COLECTOMY, CONVERTED TO OPEN N/A 07/23/2022    Performed by Fredna Jasper, MD at [facility] OR    ROBOT ASSISTED SURGERY N/A 07/23/2022    Performed by Fredna Jasper, MD at [facility] OR    REMOVAL Port-A-Cath, Colonoscopy N/A 04/13/2023    Performed by Fredna Jasper, MD at [facility] OR    COLONOSCOPY N/A 04/13/2023    Performed by Fredna Jasper, MD at [facility] OR    BREAST SURGERY      CARDIOVASCULAR STRESS TEST      COSMETIC SURGERY      ECHOCARDIOGRAM PROCEDURE      ELECTROCARDIOGRAM      TUNNELED VENOUS PORT PLACEMENT  July 2024     Family History   Problem Relation Name Age of Onset    Colon Polyps Mother Arta Lark     Heart Attack Mother Arta Lark     Diabetes Mother Arta Lark  Cancer-Breast Mother Arta Lark 50    Heart Disease Mother Arta Lark     Hypertension Mother Arta Lark     High Cholesterol Mother Arta Lark     Sudden Cardiac Death Mother Arta Lark     Cancer Mother Arta Lark     Heart Disease Father Drema Genta     Hypothyroid Father Drema Genta     Hypertension Father Drema Genta     Thyroid Disease Father Drema Genta     High Cholesterol Father Drema Genta     Hypertension Brother Marylee Snowball     Thyroid Disease Brother Marylee Snowball     [facility] Maternal Terrial Fetch Podzimek 60    Diabetes Maternal Aunt [place] Podzimek     Arthritis-rheumatoid Maternal Aunt Wilma Podzimek     Cancer-Breast Maternal Aunt Wilma Podzimek     Cancer-Ovarian Maternal Aunt Clinton Podzimek     Diabetes Maternal Urban Garden     Heart Disease Maternal Grandfather Thom Fleeting     Sudden Cardiac Death Maternal Cherilynn Cornea     Thyroid Disease Brother Candise Chambers     Cancer-Breast Paternal Cousin  59    Cancer-Ovarian Maternal Aunt  50 - 59    Cancer-Breast Maternal Aunt Elnora 40 - 49    Cancer-Prostate Maternal Uncle Eldon 70    Cancer-Ovarian Maternal Uncle Eldon     Cancer-Ovarian Maternal Aunt Elnora      Social History     Socioeconomic History    Marital status: Married   Tobacco Use    Smoking status: Never     Passive exposure: Never    Smokeless tobacco: Never   Vaping Use    Vaping status: Never Used   Substance and Sexual Activity    Alcohol use: Yes     Alcohol/week: 1.0 standard drink of alcohol     Types: 1 Standard drinks or equivalent per week     Comment: socially    Drug use: Never    Sexual activity: Yes     Partners: Male     Birth control/protection: I.U.D.     Vaping/E-liquid Use    Vaping Use Never User      ROS    Objective:          carvediloL (COREG) 6.25 mg tablet Take one tablet by mouth twice daily with meals. Take with food.    CHOLEcalciferoL (vitamin D3) (OPTIMAL D3) 50,000 units capsule Take one capsule by mouth every 7 days. 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    escitalopram oxalate (LEXAPRO) 10 mg tablet Take one tablet by mouth daily.    ferrous sulfate (IRON) 325 mg (65 mg iron) tablet Take one tablet by mouth daily. Take on an empty stomach at least 1 hour before or 2 hours after food.    INTRAUTERINE DEVICE (IUD) IU by Intrauterine route.    levothyroxine (SYNTHROID) 50 mcg tablet Take one tablet by mouth daily.    lidocaine/prilocaine (EMLA) 2.5/2.5 % topical cream Apply pea sized amount of cream in a thick layer over port site 45 minutes prior to access, cover with transparent dressing    loperamide (IMODIUM A-D) 2 mg capsule TAKE ONE CAPSULE BY MOUTH THREE TIMES DAILY, 30-45 MINUTES  prior to meals    LORazepam (ATIVAN) 0.5 mg tablet Take 1 tab by mouth every 6 hrs as needed N/V not controlled by Zofran or Compazine. May also use every 6 hrs as needed anxiety or at bedtime insomnia.    losartan (COZAAR) 25 mg tablet Take one-half tablet by mouth daily.    metFORMIN (GLUCOPHAGE) 1,000 mg tablet Take one tablet by mouth daily with breakfast.    montelukast (SINGULAIR) 10 mg tablet Take one tablet by mouth at bedtime daily.    OLANZapine (ZYPREXA) 5 mg tablet Take one tablet by mouth at bedtime daily. Indications: nausea and vomiting caused by cancer drugs    ondansetron HCL (ZOFRAN) 8 mg tablet Take one tablet by mouth every 8 hours as needed for Nausea or Vomiting. Avoid on Days 1-2 of each chemotherapy cycle.    prochlorperazine maleate (COMPAZINE) 10 mg tablet Take one tablet by mouth every 6 hours as needed for Nausea or Vomiting.    rosuvastatin (CRESTOR) 5 mg tablet Take one tablet by mouth daily.    tamoxifen (NOLVADEX) 20 mg tablet TAKE 1 TABLET EVERY NIGHT AT BEDTIME    tirzepatide (MOUNJARO) 7.5 mg/0.5 mL injector PEN Inject 0.5 mL under the skin every 7 days. wednesday     Vitals:    05/11/23 1312   BP: 107/74   BP Source: Arm, Left Upper   Pulse: 87   Temp: 36.7 ?C (98 ?F)   Resp: 18   SpO2: 98%   TempSrc: Temporal   PainSc: Zero   Weight: 119.6 kg (263 lb 11.2 oz)   Height: 182.9 cm (6')     Body mass index is 35.76 kg/m?Jennifer Durham     Physical Exam  Constitutional:       Appearance: Normal appearance.   HENT:      Nose: Nose normal.   Eyes:      Extraocular Movements: Extraocular movements intact.   Pulmonary:      Effort: Pulmonary effort is normal.   Abdominal:      General: There is no distension.      Palpations: Abdomen is soft.      Comments: Small umbilicus hernia which is non-tender, no palpable superior umbilical hernia   Neurological:      Mental Status: She is alert.       Assessment and Plan:  Colorectal cancer surveillance  Status post subtotal colectomy with no signs of recurrence on recent imaging. Participates in circulating tumor DNA study.  - Schedule next colonoscopy in three years.  - Continue participation in circulating tumor DNA study.  - Follow up with Dr. Paulene Boron for ongoing surveillance.    Post-colectomy diarrhea  Experiences urgency and diarrhea due to colon removal. Uses Imodium as needed. Acknowledged bowel habits may have plateaued post-surgery.  - Continue using Imodium as needed.  - Advise dietary modifications.  - Reassure that current bowel habits are manageable.    Umbilical hernia  Suspected small umbilical hernia due to abdominal wall weakness. Not causing significant pain. Recent scans show no major issues.  - Monitor with CT scans.  - Advise to avoid heavy lifting.  - Encourage weight loss.    Ewing Holiday, MD  Colon and Rectal Surgery  Pager: [phone]

## 2023-05-11 NOTE — Progress Notes
 Review of Systems   Constitutional: Negative.    HENT: Negative.     Eyes: Negative.    Respiratory: Negative.     Cardiovascular: Negative.    Gastrointestinal: Negative.    Endocrine: Negative.    Genitourinary: Negative.    Musculoskeletal: Negative.    Skin: Negative.    Allergic/Immunologic: Negative.    Neurological: Negative.    Hematological: Negative.    Psychiatric/Behavioral: Negative.

## 2023-05-11 NOTE — Research Notes
 Protocol Name:16-002/NSABP C-14   Study Title: CORRECT-MRD II: Second Colorectal Cancer Clinical Validation Study to Predict Recurrence Using a Circulating Tumor DNA Assay to Detect Minimal Residual Disease  [id number]  Baseline Weight: 130.3 kg  Today's Visit: Visit 4 Blood Draw (V04)        Participant present for Blood Draw (V04) visit. There were no adverse events associated with the lab draw. Blood was drawn via vein.  Blood drawn at [facility] location and transported to [employer] via Pharmacologist     Participant will be reimbursed via [employer].      Next due: Year 1 - Visit 5 - Month 12 (? 1 month)      -R. Trevonte Ashkar

## 2023-06-06 ENCOUNTER — Ambulatory Visit: Admit: 2023-06-06 | Discharge: 2023-06-06 | Payer: PRIVATE HEALTH INSURANCE

## 2023-06-06 ENCOUNTER — Encounter: Admit: 2023-06-06 | Discharge: 2023-06-06 | Payer: PRIVATE HEALTH INSURANCE

## 2023-06-09 ENCOUNTER — Encounter: Admit: 2023-06-09 | Discharge: 2023-06-09 | Payer: PRIVATE HEALTH INSURANCE

## 2023-06-13 ENCOUNTER — Encounter: Admit: 2023-06-13 | Discharge: 2023-06-13 | Payer: PRIVATE HEALTH INSURANCE

## 2023-06-13 DIAGNOSIS — C189 Malignant neoplasm of colon, unspecified: Secondary | ICD-10-CM

## 2023-06-14 ENCOUNTER — Encounter: Admit: 2023-06-14 | Discharge: 2023-06-14 | Payer: PRIVATE HEALTH INSURANCE

## 2023-06-15 ENCOUNTER — Encounter: Admit: 2023-06-15 | Discharge: 2023-06-15 | Payer: PRIVATE HEALTH INSURANCE

## 2023-06-15 DIAGNOSIS — C50411 Malignant neoplasm of upper-outer quadrant of right female breast: Secondary | ICD-10-CM

## 2023-06-15 MED ORDER — GOSERELIN 10.8 MG SC IMPL
10.8 mg | Freq: Once | SUBCUTANEOUS | 0 refills | Status: CP
Start: 2023-06-15 — End: ?
  Administered 2023-06-15: 21:00:00 10.8 mg via SUBCUTANEOUS

## 2023-06-15 MED ORDER — LIDOCAINE (PF) 10 MG/ML (1 %) IJ SOLN
2 mL | Freq: Once | SUBCUTANEOUS | 0 refills | Status: AC
Start: 2023-06-15 — End: ?

## 2023-06-15 NOTE — Progress Notes
 Patient received C6D1 Zoladex in LLQ and tolerated without difficulty.  No pertinent changes since last assessment.

## 2023-06-20 NOTE — Progress Notes
 Name: Jerlene Rockers          MRN: [id number]      DOB: Sep 08, 1975      AGE: 48 y.o.   DATE OF SERVICE: 06/21/2023    Subjective:             Reason for Visit:  Heme/Onc Care      Janessa Tessie Ordaz is a 48 y.o. female.      Cancer Staging   Colon cancer (CMS-HCC)  Staging form: Colon And Rectum, AJCC 8th Edition  - Clinical stage from 07/23/2022: Stage IIIB (cT3, cN2a, cM0) - Signed by Wolfgang Hawthorn, MD on 08/11/2022    Malignant neoplasm of upper-outer quadrant of right breast in female, estrogen receptor positive (CMS-HCC)  Staging form: Breast, AJCC 8th Edition  - Clinical stage from 07/04/2019: Stage IA (cT1c, cN0(f), cM0, G2, ER+, PR+, HER2-) - Signed by Lula Sale, PA-C on 07/12/2019  - Pathologic stage from 08/21/2019: Stage IA (pT1c, pN1a, cM0, G2, ER+, PR+, HER2-) - Signed by Lula Sale, PA-C on 08/30/2019      History of Present Illness    48 y.o. female with hx of stage IA ER/PR+, HER2- IDC of the right breast diagnosed in 06/2019 after detecting dimpling of her breast in 05/2019. She presented for mammogram which identified a spiculated mass in the upper right medial breast. She underwent ultrasound and biopsy that confirmed carcinoma . Pathology review at [facility] confirmed grade 2 IDC with a focal lobular growth pattern and lymphoid tissue with no evidence of carcinoma in the right axillary specimen. She was recommended to undergo upfront surgery. On 08/01/2019, she underwent bilateral skin sparing mastectomy and right sentinel lymph node biopsy, axilla lymph node dissection, and reconstruction.  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  A 5 mm focus of metastatic carcinoma was found in 1 of 4 sentinel lymph nodes.  0 of 27 additional axillary nodes were involved. pT1c N1a M0 (1 of 34 LN+) M0. No LVI, No EIC, No ENE. She was recommended to receive adjuvant chemotherapy with TC x4 cycles, underwent adjuvant radiotherapy to her right reconstructed breast, chest wall and regional nodes to a dose of 4526 cGy in 16 fractions from 02/19/2020 - 03/11/2020.      She is on tamoxifen and zoladex.     history of hypertension, polycystic ovarian disease, and prediabetesa colonoscopy     She had a colonoscopy performed on May 29th, 2024. The colonoscopy revealed a moderately differentiated adenocarcinoma suspected to be at the splenic flexure, 60 cm from the anal verge, and a large rectosigmoid polyp described as pedunculated. The polyp was biopsied but not removed and was identified as a tubulovillous adenoma. CT of the chest, abdomen, and pelvis, which showed no evidence of metastatic disease. The patient's CEA level was 0.8, and she was found to be microsatellite stable.     07/23/2022  Extended left hemi colectomy: pT3N2 (LN 5/48) mod diff adenocarcinoma, Lymphatic and / or Vascular Invasion: Present; Perineural Invasion--Present     Interval History,     RTC for follow up with new images and labs.  Clinically, the pt is doing well . No F/C/N/V/D, good appetite, good energy level     Review of Systems   Constitutional:  Positive for fatigue. Negative for activity change, appetite change, chills, diaphoresis, fever and unexpected weight change.   HENT:  Negative for facial swelling, mouth  sores, sore throat and trouble swallowing.    Eyes: Negative.    Respiratory: Negative.  Negative for cough, chest tightness, shortness of breath and wheezing.    Cardiovascular: Negative.  Negative for chest pain, palpitations and leg swelling.   Gastrointestinal:  Positive for constipation. Negative for abdominal distention, abdominal pain, anal bleeding, blood in stool, diarrhea, nausea, rectal pain and vomiting.   Endocrine: Positive for cold intolerance.   Genitourinary: Negative.    Musculoskeletal: Negative.  Negative for arthralgias, back pain, neck pain and neck stiffness.   Skin: Negative.  Negative for color change, pallor, rash and wound.   Neurological:  Positive for numbness. Negative for dizziness, weakness, light-headedness and headaches.   Psychiatric/Behavioral: Negative.          Past Medical History:    Allergy    Anxiety    Anxiety disorder    Breast cancer (CMS-HCC)    Cancer of colon (CMS-HCC)    Depression    Depressive disorder, not elsewhere classified    Difficult intravenous access    Heartburn    Hx of radiation therapy    Hyperlipemia    Hypertension    Hypothyroidism    Insulin resistance    Limb alert care status    Obesity    Other malignant neoplasm without specification of site    PCOS (polycystic ovarian syndrome)    Personal history of irradiation    Seasonal allergies    Tachyarrhythmia     Surgical History:   Procedure Laterality Date    HX TONSILLECTOMY  2001    turbinates/adenoids 2015    HX TONSIL AND ADENOIDECTOMY  2011    LEFT PERONEAL TENDON REPAIR Left 12/15/2016    Performed by Velta Gianotti, MD at [facility] OR    Left Skin Sparing Mastectomy Left 08/21/2019    Performed by Wing Hauser, MD at [facility] OR    IDENTIFICATION SENTINEL LYMPH NODE Right 08/21/2019    Performed by Wing Hauser, MD at [facility] OR    INJECTION RADIOACTIVE TRACER FOR SENTINEL NODE IDENTIFICATION Right 08/21/2019    Performed by Wing Hauser, MD at [facility] OR    RADIOLOGICAL EXAM SURGICAL SPECIMEN Right 08/21/2019    Performed by Wing Hauser, MD at [facility] OR    MASTECTOMY - MODIFIED RADICAL WITH AXILLARY LYMPH NODE DISSECTION Right 08/21/2019    Performed by Kilgore, Lyndsey J, MD at [facility] OR    RECONSTRUCTION BREAST WITH TISSUE EXPANDER AND SUBSEQUENT EXPANSION - IMMEDIATE/ DELAYED Bilateral 08/21/2019    Performed by Barabara Boning, MD at [facility] OR    IMPLANTATION BIOLOGIC IMPLANT FOR SOFT TISSUE REINFORCEMENT Bilateral 08/21/2019    Performed by Barabara Boning, MD at [facility] OR    RECONSTRUCTION BREAST WITH DEEP INFERIOR EPIGASTRIC PERFORATOR FLAP/ SUPERFICIAL INFERIOR EPIGASTRIC ARTERY FLAP Bilateral 07/02/2020    Performed by Barabara Boning, MD at [facility] OR    RECONSTRUCTION BREAST WITH FREE FLAP - request 22 modifier, complicated procedure due to obesity and intramuscular course Bilateral 07/02/2020    Performed by Barabara Boning, MD at [facility] OR    INTRAVENOUS INJECTION AGENT TO TEST VASCULAR FLOW IN FLAP/ GRAFT Bilateral 07/02/2020    Performed by Barabara Boning, MD at [facility] OR    APPLICATION NEGATIVE PRESSURE WOUND THERAPY Bilateral 07/02/2020    Performed by Barabara Boning, MD at [facility] OR    IMPLANTATION MESH CLOSURE OF DEBRIDEMENT FOR NECROTIZING SOFT TISSUE INFECTION Bilateral 07/02/2020  Performed by Barabara Boning, MD at [facility] OR    REMOVAL TISSUE EXPANDER Bilateral 07/02/2020    Performed by Barabara Boning, MD at [facility] OR    INCISION AND DRAINAGE HEMATOMA/ SEROMA - TORSO N/A 08/05/2020    Performed by Barabara Boning, MD at [facility] OR    REPAIR INTERMEDIATE WOUND 12.6 CM TO 20.0 CM - TRUNK - total length 24cm  08/05/2020    Performed by Barabara Boning, MD at [facility] OR    GRAFTING AUTOLOGOUS FAT BY LIPOSUCTION TO TRUNK/ BREASTS/ SCALP/ ARMS/ LEGS - 50 CC OR LESS 60cc to the R, 90cc to the L Bilateral 08/10/2021    Performed by Barabara Boning, MD at [facility] OR    GRAFTING AUTOLOGOUS FAT BY LIPOSUCTION TO TRUNK/ BREASTS/ SCALP/ ARMS/ LEGS - EACH ADDITIONAL 50 CC Bilateral 08/10/2021    Performed by Barabara Boning, MD at [facility] OR    REVISION RECONSTRUCTED BREAST Left 08/10/2021    Performed by Barabara Boning, MD at [facility] OR    REPAIR INTERMEDIATE WOUND  2.5 CM OR LESS - TORSO abdominal dogear removal R side - 9cm, L side - 10cm N/A 08/10/2021    Performed by Barabara Boning, MD at [facility] OR    COLONOSCOPY DIAGNOSTIC WITH SPECIMEN COLLECTION BY BRUSHING/ WASHING - FLEXIBLE N/A 06/23/2022    Performed by Buckles, Tino Foreman, MD at [facility] OR    ROBOT ASSISTED LEFT COLECTOMY, CONVERTED TO OPEN N/A 07/23/2022    Performed by Fredna Jasper, MD at [facility] OR    ROBOT ASSISTED SURGERY N/A 07/23/2022    Performed by Fredna Jasper, MD at [facility] OR    REMOVAL Port-A-Cath, Colonoscopy N/A 04/13/2023    Performed by Fredna Jasper, MD at [facility] OR    COLONOSCOPY N/A 04/13/2023    Performed by Fredna Jasper, MD at [facility] OR    BREAST SURGERY      CARDIOVASCULAR STRESS TEST      COSMETIC SURGERY      ECHOCARDIOGRAM PROCEDURE      ELECTROCARDIOGRAM      TUNNELED VENOUS PORT PLACEMENT  July 2024     Family History   Problem Relation Name Age of Onset    Colon Polyps Mother Arta Lark     Heart Attack Mother Arta Lark     Diabetes Mother Arta Lark     [facility] Mother Arta Lark 50    Heart Disease Mother Arta Lark     Hypertension Mother Arta Lark     High Cholesterol Mother Arta Lark     Sudden Cardiac Death Mother Arta Lark     Cancer Mother Arta Lark     Heart Disease Father Drema Genta     Hypothyroid Father Drema Genta     Hypertension Father Drema Genta     Thyroid Disease Father Drema Genta     High Cholesterol Father Drema Genta     Hypertension Brother Marylee Snowball     Thyroid Disease Brother Marylee Snowball     [facility] Maternal Terrial Fetch Podzimek 60    Diabetes Maternal Aunt [place] Podzimek     Arthritis-rheumatoid Maternal Aunt Wilma Podzimek     Cancer-Breast Maternal Aunt Wilma Podzimek     Cancer-Ovarian Maternal Aunt [place] Podzimek     Diabetes Maternal Grandmother Sarah [place]     Heart Disease Maternal Grandfather Thom Fleeting     Sudden Cardiac Death Maternal Cherilynn Cornea  Thyroid Disease Brother Candise Chambers     Cancer-Breast Paternal Cousin  63    Cancer-Ovarian Maternal Aunt  50 - 59    Cancer-Breast Maternal Aunt Elnora 40 - 49    Cancer-Prostate Maternal Uncle Eldon 70    Cancer-Ovarian Maternal Uncle Eldon     Cancer-Ovarian Maternal Aunt Elnora      Social History     Socioeconomic History    Marital status: Married   Tobacco Use    Smoking status: Never     Passive exposure: Never    Smokeless tobacco: Never   Vaping Use    Vaping status: Never Used   Substance and Sexual Activity    Alcohol use: Yes     Alcohol/week: 1.0 standard drink of alcohol     Types: 1 Standard drinks or equivalent per week     Comment: socially    Drug use: Never    Sexual activity: Yes     Partners: Male     Birth control/protection: I.U.D.     Vaping/E-liquid Use    Vaping Use Never User                    Objective:          carvediloL (COREG) 6.25 mg tablet Take one tablet by mouth twice daily with meals. Take with food.    CHOLEcalciferoL (vitamin D3) (OPTIMAL D3) 50,000 units capsule Take one capsule by mouth every 7 days. Sundays    ciclopirox (PENLAC) 8 % topical solution apply topically to entire affected nail(s) once daily for NAIL FOLD fungal disease    escitalopram oxalate (LEXAPRO) 10 mg tablet Take one tablet by mouth daily.    ferrous sulfate (IRON) 325 mg (65 mg iron) tablet Take one tablet by mouth daily. Take on an empty stomach at least 1 hour before or 2 hours after food.    INTRAUTERINE DEVICE (IUD) IU by Intrauterine route.    levothyroxine (SYNTHROID) 125 mcg tablet Take one-half tablet by mouth daily.    loperamide (IMODIUM A-D) 2 mg capsule TAKE ONE CAPSULE BY MOUTH THREE TIMES DAILY, 30-45 MINUTES prior to meals (Patient taking differently: as Needed. TAKE ONE CAPSULE BY MOUTH THREE TIMES DAILY, 30-45 MINUTES prior to meals)    LORazepam (ATIVAN) 0.5 mg tablet Take 1 tab by mouth every 6 hrs as needed N/V not controlled by Zofran or Compazine. May also use every 6 hrs as needed anxiety or at bedtime insomnia.    metFORMIN-XR (GLUCOPHAGE XR) 750 mg extended release tablet Take one tablet by mouth daily.    montelukast (SINGULAIR) 10 mg tablet Take one tablet by mouth at bedtime daily.    OLANZapine (ZYPREXA) 5 mg tablet Take one tablet by mouth at bedtime daily. Indications: nausea and vomiting caused by cancer drugs    ondansetron HCL (ZOFRAN) 8 mg tablet Take one tablet by mouth every 8 hours as needed for Nausea or Vomiting. Avoid on Days 1-2 of each chemotherapy cycle.    prochlorperazine maleate (COMPAZINE) 10 mg tablet Take one tablet by mouth every 6 hours as needed for Nausea or Vomiting.    rosuvastatin (CRESTOR) 5 mg tablet Take one tablet by mouth daily.    tamoxifen (NOLVADEX) 20 mg tablet TAKE 1 TABLET EVERY NIGHT AT BEDTIME    tirzepatide (MOUNJARO) 7.5 mg/0.5 mL injector PEN Inject 0.5 mL under the skin every 7 days. wednesday     Vitals:    06/21/23 1049   BP: 112/77  BP Source: Arm, Left Upper   Pulse: 73   Temp: 36.3 ?C (97.3 ?F)   Resp: 16   SpO2: 99%   TempSrc: Temporal   PainSc: Eight   Weight: 116.6 kg (257 lb)           Body mass index is 34.86 kg/m?Aaron Aas     Pain Score: Eight  Pain Loc: Back    Fatigue Scale: 7    Pain Addressed:  N/A    Patient Evaluated for a Clinical Trial: Patient currently in screening for a treatment clinical trial.     [employer] Cooperative Oncology Group performance status is 0, Fully active, able to carry on all pre-disease performance without restriction.Aaron Aas     Physical Exam  Vitals reviewed.   Constitutional:       General: She is not in acute distress.     Appearance: Normal appearance. She is well-developed. She is not ill-appearing, toxic-appearing or diaphoretic.   HENT:      Head: Normocephalic and atraumatic.      Nose: Nose normal. No rhinorrhea.      Mouth/Throat:      Mouth: Mucous membranes are moist. Mucous membranes are not pale. No oral lesions.      Pharynx: Oropharynx is clear. No oropharyngeal exudate or posterior oropharyngeal erythema.      Tonsils: No tonsillar abscesses.   Eyes:      General: No scleral icterus.        Right eye: No discharge.         Left eye: No discharge.      Extraocular Movements: Extraocular movements intact.      Conjunctiva/sclera: Conjunctivae normal.      Pupils: Pupils are equal, round, and reactive to light.   Cardiovascular:      Rate and Rhythm: Normal rate and regular rhythm.      Pulses: Normal pulses.      Heart sounds: Normal heart sounds. No murmur heard.     No gallop.   Pulmonary:      Effort: Pulmonary effort is normal. No respiratory distress.      Breath sounds: Normal breath sounds. No stridor. No wheezing, rhonchi or rales.   Chest:      Chest wall: No tenderness.   Abdominal:      General: Abdomen is flat. Bowel sounds are normal. There is no distension.      Palpations: Abdomen is soft. There is no mass.      Tenderness: There is no abdominal tenderness. There is no guarding or rebound.      Hernia: No hernia is present.   Musculoskeletal:         General: No swelling, tenderness, deformity or signs of injury. Normal range of motion.      Cervical back: Normal range of motion and neck supple. No rigidity. No muscular tenderness.      Right lower leg: No edema.      Left lower leg: No edema.   Lymphadenopathy:      Cervical: No cervical adenopathy.   Skin:     General: Skin is warm and dry.      Coloration: Skin is not jaundiced or pale.      Findings: No bruising, erythema, lesion or rash.   Neurological:      General: No focal deficit present.      Mental Status: She is alert and oriented to person, place, and time. Mental status is at baseline.  Cranial Nerves: No cranial nerve deficit.      Motor: No weakness.      Coordination: Coordination normal.      Gait: Gait normal.   Psychiatric:         Mood and Affect: Mood normal.         Behavior: Behavior normal.         Thought Content: Thought content normal.         Judgment: Judgment normal.          07/23/2022  Pathology    Final Diagnosis:     A. 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      B. Colon, extended left hemi colectomy:    Adenocarcinoma moderately differentiated. See comment.   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 Additional proximal margin#1, excision:   Colon tissue with Tubular adenomas. See comment.   Three lymph nodes negative for metastatic carcinoma (0/3).     D. Additional proximal margins #2, excision:    Tubulovillous adenoma with focal high-grade dysplasia   Eleven lymph nodes negative for metastatic carcinoma (0/11).       Comment:   Pursuant to the Quality Assurance Program at the [facility] Pathology Department, selected slides from this case have been   concurrently reviewed by the following pathologist: Dr. Derwood Flor   who   agrees with the final diagnosis.       CASE SUMMARY: (COLON AND RECTUM: Resection)   Standard(s): AJCC-UICC 8   CAP Version: Colon and Rectum Resection 4.3.0.0     SPECIMEN     Procedure   ___ Subtotal (extended) left abdominal colectomy     Macroscopic Evaluation of Mesorectum (required for rectal cancers)   ___ Not applicable     TUMOR     Tumor Site (select all that apply)   ___ Transverse colon/Splenic flexure     Histologic Type   ___ Adenocarcinoma     Histologic Grade   ___ G2, moderately differentiated     Tumor Size   ___ Greatest dimension in Centimeters (cm): 4.0 x 3.8 x 0.7 cm     Multiple Primary Sites (e.g., hepatic flexure and transverse colon)   ___ Not applicable (no additional primary site(s) present)     Tumor Extent   ___ Invades through muscularis propria into the pericolonic or perirectal   tissue     Sub-mucosal Invasion (required only for pT1 tumors)   ___ Not applicable (not a pT1 tumor)     Macroscopic Tumor Perforation   ___ Not identified     Lymphatic and / or Vascular Invasion(select all that apply)   ___ Present (not otherwise specified)     Perineural Invasion   ___ Present     Tumor Budding Score   ___ Intermediate (5-9)     Treatment Effect   ___ No known presurgical therapy     MARGINS     Margin Status for Invasive Carcinoma   ___ All margins negative for invasive carcinoma   +Closest Margin(s) to Invasive Carcinoma (select all that apply)   ___ Radial (circumferential)/Mesenteric   +Distance from Invasive Carcinoma to Closest Margin   Specify in Centimeters (cm)   ___ Exact distance in cm: 3.4 cm   Distance from Invasive Carcinoma to Radial (  Circumferential) Margin   (required for rectal   tumors)   ___ Distance already reported as closest margin.   +Distance from Invasive Carcinoma to Distal Margin (recommended for rectal   tumors)   Specify in Centimeters (cm)   ___ Exact distance in cm: 58.0 cm   Margin(s) Involved by Invasive Carcinoma (select all that apply)   ___ Not applicable     Margin Status for Non-Invasive Tumor (select all that apply)   ___ All margins negative for high-grade dysplasia / intramucosal carcinoma   and low-grade dysplasia     REGIONAL LYMPH NODES     Regional Lymph Node Status   ___ Regional lymph nodes present   ___ Tumor present in regional lymph nodes   Number of Lymph Nodes with Tumor   ___ Exact number (specify): 5   Number of Lymph Nodes Examined   ___ Exact number (specify): 34     Tumor Deposits   ___ Not identified     DISTANT METASTASIS     Distant Site(s) Involved, if applicable (select all that apply)   ___ Not applicable     pTNM CLASSIFICATION (AJCC 8th Edition)   Reporting of pT, pN, and (when applicable) pM categories is based on   information available to the pathologist at the time the report is issued.   As per the AJCC (Chapter 1, 8th Ed.) it is the managing physician's   responsibility to establish the final pathologic stage based upon all   pertinent information, including but potentially not limited to this   pathology report.     Modified Classification (required only if applicable) (select all that   apply)   ___ Not applicable     pT Category   ___ pT3: Tumor invades through the muscularis propria into pericolorectal   tissues     T Suffix (required only if applicable)   ___ Not applicable     pN Category   pN2: Four or more regional nodes are positive   ___ pN2a: Four to six regional lymph nodes are positive     pM Category (required only if confirmed pathologically)   ___ Not applicable - pM cannot be determined from the submitted   specimen(s)     ADDITIONAL FINDINGS     +Additional Findings (select all that apply)   ___ Adenoma(s) (Tubulovillous adenoma with high grade dysplasia, 6.3 cm.   1.9 cm., rectosigmoid; tubular adenomas)      Latest Reference Range & Units 03/22/23 07:33   Hemoglobin 12.0 - 15.0 g/dL 21.3   Hematocrit 08.6 - 45.0 % 36.1   Platelet Count 150 - 400 10*3/uL 247   White Blood Cells 4.50 - 11.00 10*3/uL 4.90   Neutrophils 41 - 77 % 60   Absolute Neutrophil Count 1.80 - 7.00 10*3/uL 2.90   Lymphocytes 24 - 44 % 27   Absolute Lymph Count 1.00 - 4.80 10*3/uL 1.30   Monocytes 4 - 12 % 8   Absolute Monocyte Count 0.00 - 0.80 10*3/uL 0.40   Eosinophils 0 - 5 % 4   Absolute Eosinophil Count 0.00 - 0.45 10*3/uL 0.20   Absolute Basophil Count 0.00 - 0.20 10*3/uL 0.10   Basophils 0 - 2 % 1   RBC 4.00 - 5.00 10*6/uL 4.19   MCV 80.0 - 100.0 fL 86.2   MCH 26.0 - 34.0 pg 28.6   MCHC 32.0 - 36.0 g/dL 57.8   MPV 7.0 - 46.9 fL 7.6   RDW 11.0 - 15.0 %  14.9   Sodium 137 - 147 mmol/L 140   Potassium 3.5 - 5.1 mmol/L 3.9   Chloride 98 - 110 mmol/L 104   CO2 21 - 30 mmol/L 27   Anion Gap 3 - 12  9   Blood Urea Nitrogen 7 - 25 mg/dL 11   Creatinine 9.60 - 1.00 mg/dL 4.54   Glomerular Filtration Rate (GFR) >60 mL/min >60   Glucose 70 - 100 mg/dL 98   Albumin 3.5 - 5.0 g/dL 4.3   Calcium 8.5 - 09.8 mg/dL 9.3   Total Bilirubin 0.2 - 1.3 mg/dL 0.6   Total Protein 6.0 - 8.0 g/dL 7.3   AST (SGOT) 7 - 40 U/L 24   ALT (SGPT) 7 - 56 U/L 10   Alk Phosphatase 25 - 110 U/L 42     CT 06/21/2023:  CHEST:   1. Unchanged indeterminate pulmonary nodule within the right lower lobe   since at least 01/05/2022 which may be a granuloma or scar. No new or   enlarging pulmonary nodule to suggest pulmonary metastatic disease.   2.  No thoracic lymphadenopathy.     ABDOMEN AND PELVIS:   1.  Prior extended partial colectomy with colorectal anastomosis. No   evidence of local mass recurrence or metastatic disease in the abdomen or   pelvis.   2. Unchanged postsurgical scarring and fat necrosis within the right   lower abdomen. Assessment and Plan:    48 yr old female with history of right breast cancer (07/2019): ER/PR+, HER2- IDC pT1c N1a M0 (1 of 34 LN+) M0.  on  tamoxifen and zoladex. Who has newly diagnosed left colon adenocarcinoma s/P surgery: pT3N2 (LN 5/48) mod diff , positive for  Lymphatic and / or Vascular Invasion, and Perineural Invasion.     We discussed the study of ctDNA (C-14),  on it now.    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 Doing well, no issues. No F/C/N/V/D.  Images -- NED  Lab no issues  Colonoscopy 04/13/23 no issues.    Explaned images and lab to the pt.    -- cont surveillance  -- cont follow with Dr. Adin Aguas and with colonoscopy (scheduled)  -- Continue follow breast cancer care team, last seen 01/04/23  -- Continue supportive care.   -- Pt to call with any questions, concerns or symptoms at any time and we can see her sooner.     Patient seen and discussed with Dr. Paulene Boron.     Nanuli Gvazava, M.D.  Hematology/Oncology fellow, PGY5.       Attending Note:    I saw and  examined the patient with Dr.  Gvazava   . I agree with the history, review of system, data,  and I am involved in and guided examination, and assessment/plan.     48 yr old female with history of right breast cancer (07/2019): ER/PR+, HER2- IDC pT1c N1a M0 (1 of 34 LN+) M0.  on  tamoxifen and zoladex. Who has newly diagnosed left colon adenocarcinoma s/P surgery: pT3N2 (LN 5/48) mod diff , positive for  Lymphatic and / or Vascular Invasion, and Perineural Invasion.     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 Doing well, no issues. No F/C/N/V/D.  Images -- NED  Lab no  issues  Colonoscopy 04/13/23 no issues.    Explaned images and lab to the pt.    -- cont surveillance in 3 months with images and labs  -- cont follow with Dr. Adin Aguas and with colonoscopy (scheduled)  -- Continue follow breast cancer care team, last seen 01/04/23  -- Continue supportive care.   -- Pt to call with any questions, concerns or symptoms at any time and we can see her sooner.       The patient and family had a chance to ask [id number], [phone] [id number] [phone] their [phone]       Wolfgang Hawthorn, MD, Filomena Huge

## 2023-06-21 ENCOUNTER — Encounter: Admit: 2023-06-21 | Discharge: 2023-06-21 | Payer: PRIVATE HEALTH INSURANCE

## 2023-06-21 DIAGNOSIS — C186 Malignant neoplasm of descending colon: Secondary | ICD-10-CM

## 2023-06-21 MED ADMIN — IOHEXOL 350 MG IODINE/ML IV SOLN [81210]: 100 mL | INTRAVENOUS | @ 14:00:00 | Stop: 2023-06-21 | NDC 00407141491

## 2023-06-21 MED ADMIN — SODIUM CHLORIDE 0.9 % IJ SOLN [7319]: 50 mL | INTRAVENOUS | @ 14:00:00 | Stop: 2023-06-21 | NDC 00409488850

## 2023-06-21 NOTE — Patient Instructions
 Dr. Miguel Rota and Lianne Moris, APRN-NP  Nurses: Lucillie Garfinkel, BSN, RN & Erenest Rasher, BSN, RN  Phone: [phone]   Fax: [phone]  Available Monday - Friday 8:00 am - 4:00 pm    SCHEDULING NEEDS: [phone]    On-call number for urgent needs outside of business hours: [phone]  Ask for the on-call Oncology fellow to be paged and they will call you back.    Medication refills: Please contact your pharmacy FIRST for medication refills; if they do not have any refills on file, they will contact the office. Make sure they have our correct contact information on file.  P: [phone], F: [phone]    MyChart Messages: All messages come to the nurse and we discuss with Dr. Wynelle Link and Lianne Moris, APRN. Please make sure to select Dr. Wynelle Link or Denny Peon when sending a message. Messages are answered 8:00 am - 4:00 pm Monday - Friday, holidays excluded. Please keep in mind we have 4 hours to respond to your messages.    Phone Calls: Our phone number is strictly a Camera operator. We check these messages all day and will get back with you. Please state your full name, date of birth, and reason for your call with as much detail as possible. Please keep in mind we have 4 hours to return your call.    FMLA/Insurance/Disability paperwork: Please fill out as much of the paperwork as you are able. We request that you give [place] 7-10 business days to complete this as we have a high volume of paperwork to complete for our patients.

## 2023-06-22 ENCOUNTER — Encounter: Admit: 2023-06-22 | Discharge: 2023-06-22 | Payer: PRIVATE HEALTH INSURANCE

## 2023-06-22 DIAGNOSIS — C186 Malignant neoplasm of descending colon: Secondary | ICD-10-CM

## 2023-06-28 ENCOUNTER — Encounter: Admit: 2023-06-28 | Discharge: 2023-06-28 | Payer: PRIVATE HEALTH INSURANCE

## 2023-07-05 ENCOUNTER — Encounter: Admit: 2023-07-05 | Discharge: 2023-07-05 | Payer: PRIVATE HEALTH INSURANCE

## 2023-07-30 ENCOUNTER — Encounter: Admit: 2023-07-30 | Discharge: 2023-07-30 | Payer: PRIVATE HEALTH INSURANCE

## 2023-08-27 NOTE — Progress Notes
 Name: Jennifer Durham          MRN: [id number]      DOB: Oct 03, 1975      AGE: 48 y.o.   DATE OF SERVICE: 08/30/2023    Subjective:             Reason for Visit:  Heme/Onc Care      Jennifer Durham is a 48 y.o. female.      Cancer Staging   Colon cancer (CMS-HCC)  Staging form: Colon And Rectum, AJCC 8th Edition  - Clinical stage from 07/23/2022: Stage IIIB (cT3, cN2a, cM0) - Signed by Austin Ferrara, MD on 08/11/2022    Malignant neoplasm of upper-outer quadrant of right breast in female, estrogen receptor positive (CMS-HCC)  Staging form: Breast, AJCC 8th Edition  - Clinical stage from 07/04/2019: Stage IA (cT1c, cN0(f), cM0, G2, ER+, PR+, HER2-) - Signed by Gean Lucienne PARAS, PA-C on 07/12/2019  - Pathologic stage from 08/21/2019: Stage IA (pT1c, pN1a, cM0, G2, ER+, PR+, HER2-) - Signed by Gean Lucienne PARAS, PA-C on 08/30/2019      History of Present Illness    48 y.o. female with hx of stage IA ER/PR+, HER2- IDC of the right breast diagnosed in 06/2019 after detecting dimpling of her breast in 05/2019. She presented for mammogram which identified a spiculated mass in the upper right medial breast. She underwent ultrasound and biopsy that confirmed carcinoma . Pathology review at [facility] confirmed grade 2 IDC with a focal lobular growth pattern and lymphoid tissue with no evidence of carcinoma in the right axillary specimen. She was recommended to undergo upfront surgery. On 08/01/2019, she underwent bilateral skin sparing mastectomy and right sentinel lymph node biopsy, axilla lymph node dissection, and reconstruction.  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  A 5 mm focus of metastatic carcinoma was found in 1 of 4 sentinel lymph nodes.  0 of 27 additional axillary nodes were involved. pT1c N1a M0 (1 of 34 LN+) M0. No LVI, No EIC, No ENE. She was recommended to receive adjuvant chemotherapy with TC x4 cycles, underwent adjuvant radiotherapy to her right reconstructed breast, chest wall and regional nodes to a dose of 4526 cGy in 16 fractions from 02/19/2020 - 03/11/2020.      She is on tamoxifen  and zoladex .     history of hypertension, polycystic ovarian disease, and prediabetesa colonoscopy     She had a colonoscopy performed on May 29th, 2024. The colonoscopy revealed a moderately differentiated adenocarcinoma suspected to be at the splenic flexure, 60 cm from the anal verge, and a large rectosigmoid polyp described as pedunculated. The polyp was biopsied but not removed and was identified as a tubulovillous adenoma. CT of the chest, abdomen, and pelvis, which showed no evidence of metastatic disease. The patient's CEA level was 0.8, and she was found to be microsatellite stable.     07/23/2022  Extended left hemi colectomy: pT3N2 (LN 5/48) mod diff adenocarcinoma, Lymphatic and / or Vascular Invasion: Present; Perineural Invasion--Present     Interval History,     RTC for follow up with new images and labs.  Clinically, the pt is doing well . No F/C/N/V/, mild chronic diarrhea, good appetite, good energy level     Review of Systems   Constitutional:  Negative for activity change, appetite change, chills, diaphoresis, fatigue, fever and unexpected weight change.   HENT:  Negative for facial swelling,  mouth sores, sore throat and trouble swallowing.    Eyes: Negative.    Respiratory: Negative.  Negative for cough, chest tightness, shortness of breath and wheezing.    Cardiovascular: Negative.  Negative for chest pain, palpitations and leg swelling.   Gastrointestinal:  Positive for diarrhea. Negative for abdominal distention, abdominal pain, anal bleeding, blood in stool, constipation, nausea, rectal pain and vomiting.   Endocrine: Negative for cold intolerance.   Genitourinary: Negative.    Musculoskeletal: Negative.  Negative for arthralgias, back pain, neck pain and neck stiffness.   Skin: Negative.  Negative for color change, pallor, rash and wound.   Neurological:  Negative for dizziness, weakness, light-headedness, numbness and headaches.   Psychiatric/Behavioral: Negative.          Past Medical History:    Allergy    Anxiety    Anxiety disorder    Breast cancer (CMS-HCC)    Cancer of colon (CMS-HCC)    Depression    Depressive disorder, not elsewhere classified    Difficult intravenous access    Heartburn    Hx of radiation therapy    Hyperlipemia    Hypertension    Hypothyroidism    Insulin  resistance    Limb alert care status    Obesity    Other malignant neoplasm without specification of site    PCOS (polycystic ovarian syndrome)    Personal history of irradiation    Seasonal allergies    Tachyarrhythmia     Surgical History:   Procedure Laterality Date    HX TONSILLECTOMY  2001    turbinates/adenoids 2015    HX TONSIL AND ADENOIDECTOMY  2011    LEFT PERONEAL TENDON REPAIR Left 12/15/2016    Performed by Niels Dorise MATSU, MD at [facility] OR    Left Skin Sparing Mastectomy Left 08/21/2019    Performed by Tawni Clint PARAS, MD at [facility] OR    IDENTIFICATION SENTINEL LYMPH NODE Right 08/21/2019    Performed by Tawni Clint PARAS, MD at [facility] OR    INJECTION RADIOACTIVE TRACER FOR SENTINEL NODE IDENTIFICATION Right 08/21/2019    Performed by Tawni Clint PARAS, MD at [facility] OR    RADIOLOGICAL EXAM SURGICAL SPECIMEN Right 08/21/2019    Performed by Tawni Clint PARAS, MD at [facility] OR    MASTECTOMY - MODIFIED RADICAL WITH AXILLARY LYMPH NODE DISSECTION Right 08/21/2019    Performed by Kilgore, Lyndsey J, MD at [facility] OR    RECONSTRUCTION BREAST WITH TISSUE EXPANDER AND SUBSEQUENT EXPANSION - IMMEDIATE/ DELAYED Bilateral 08/21/2019    Performed by Awanda Camellia BROCKS, MD at [facility] OR    IMPLANTATION BIOLOGIC IMPLANT FOR SOFT TISSUE REINFORCEMENT Bilateral 08/21/2019    Performed by Awanda Camellia BROCKS, MD at [facility] OR    RECONSTRUCTION BREAST WITH DEEP INFERIOR EPIGASTRIC PERFORATOR FLAP/ SUPERFICIAL INFERIOR EPIGASTRIC ARTERY FLAP Bilateral 07/02/2020    Performed by Awanda Camellia BROCKS, MD at [facility] OR    RECONSTRUCTION BREAST WITH FREE FLAP - request 22 modifier, complicated procedure due to obesity and intramuscular course Bilateral 07/02/2020    Performed by Awanda Camellia BROCKS, MD at [facility] OR    INTRAVENOUS INJECTION AGENT TO TEST VASCULAR FLOW IN FLAP/ GRAFT Bilateral 07/02/2020    Performed by Awanda Camellia BROCKS, MD at [facility] OR    APPLICATION NEGATIVE PRESSURE WOUND THERAPY Bilateral 07/02/2020    Performed by Awanda Camellia BROCKS, MD at [facility] OR    IMPLANTATION MESH CLOSURE OF DEBRIDEMENT FOR NECROTIZING SOFT TISSUE INFECTION Bilateral 07/02/2020    Performed  by Awanda Camellia BROCKS, MD at [facility] OR    REMOVAL TISSUE EXPANDER Bilateral 07/02/2020    Performed by Awanda Camellia BROCKS, MD at [facility] OR    INCISION AND DRAINAGE HEMATOMA/ SEROMA - TORSO N/A 08/05/2020    Performed by Awanda Camellia BROCKS, MD at [facility] OR    REPAIR INTERMEDIATE WOUND 12.6 CM TO 20.0 CM - TRUNK - total length 24cm  08/05/2020    Performed by Awanda Camellia BROCKS, MD at [facility] OR    GRAFTING AUTOLOGOUS FAT BY LIPOSUCTION TO TRUNK/ BREASTS/ SCALP/ ARMS/ LEGS - 50 CC OR LESS 60cc to the R, 90cc to the L Bilateral 08/10/2021    Performed by Awanda Camellia BROCKS, MD at [facility] OR    GRAFTING AUTOLOGOUS FAT BY LIPOSUCTION TO TRUNK/ BREASTS/ SCALP/ ARMS/ LEGS - EACH ADDITIONAL 50 CC Bilateral 08/10/2021    Performed by Awanda Camellia BROCKS, MD at [facility] OR    REVISION RECONSTRUCTED BREAST Left 08/10/2021    Performed by Awanda Camellia BROCKS, MD at [facility] OR    REPAIR INTERMEDIATE WOUND  2.5 CM OR LESS - TORSO abdominal dogear removal R side - 9cm, L side - 10cm N/A 08/10/2021    Performed by Awanda Camellia BROCKS, MD at [facility] OR    COLONOSCOPY DIAGNOSTIC WITH SPECIMEN COLLECTION BY BRUSHING/ WASHING - FLEXIBLE N/A 06/23/2022    Performed by Buckles, Toribio BROCKS, MD at [facility] OR    ROBOT ASSISTED LEFT COLECTOMY, CONVERTED TO OPEN N/A 07/23/2022    Performed by Gladis Morene HERO, MD at [facility] OR    ROBOT ASSISTED SURGERY N/A 07/23/2022    Performed by Gladis Morene HERO, MD at [facility] OR    REMOVAL Port-A-Cath, Colonoscopy N/A 04/13/2023    Performed by Gladis Morene HERO, MD at [facility] OR    COLONOSCOPY N/A 04/13/2023    Performed by Gladis Morene HERO, MD at [facility] OR    BREAST SURGERY      CARDIOVASCULAR STRESS TEST      COSMETIC SURGERY      ECHOCARDIOGRAM PROCEDURE      ELECTROCARDIOGRAM      TUNNELED VENOUS PORT PLACEMENT  July 2024     Family History   Problem Relation Name Age of Onset    Colon Polyps Mother Winton Silvan     Heart Attack Mother Winton Silvan     Diabetes Mother Winton Silvan     [facility] Mother Winton Silvan 50    Heart Disease Mother Winton Silvan     Hypertension Mother Winton Silvan     High Cholesterol Mother Winton Silvan     Sudden Cardiac Death Mother Winton Silvan     Cancer Mother Winton Silvan     Heart Disease Father Gaither Silvan     Hypothyroid Father Gaither Silvan     Hypertension Father Gaither Silvan     Thyroid Disease Father Gaither Silvan     High Cholesterol Father Gaither Silvan     Hypertension Brother Redell Silvan     Thyroid Disease Brother Redell Silvan     [facility] Maternal Wylie Case Podzimek 60    Diabetes Maternal Aunt [place] Podzimek     Arthritis-rheumatoid Maternal Aunt Wilma Podzimek     Cancer-Breast Maternal Aunt Wilma Podzimek     Cancer-Ovarian Maternal Aunt [place] Podzimek     Diabetes Maternal Grandmother Sarah [place]     Heart Disease Maternal Grandfather Fairy [place]     Sudden Cardiac Death Maternal Apolinar Fairy [place]in  Thyroid Disease Brother Larnell Silvan     Cancer-Breast Paternal Cousin  25    Cancer-Ovarian Maternal Aunt  50 - 59    Cancer-Breast Maternal Aunt Elnora 40 - 49    Cancer-Prostate Maternal Uncle Eldon 70    Cancer-Ovarian Maternal Uncle Eldon     Cancer-Ovarian Maternal Aunt Elnora      Social History     Socioeconomic History    Marital status: Married   Tobacco Use    Smoking status: Never     Passive exposure: Never    Smokeless tobacco: Never   Vaping Use    Vaping status: Never Used   Substance and Sexual Activity    Alcohol use: Yes     Alcohol/week: 1.0 standard drink of alcohol     Types: 1 Standard drinks or equivalent per week     Comment: socially    Drug use: Never    Sexual activity: Yes     Partners: Male     Birth control/protection: I.U.D.     Vaping/E-liquid Use    Vaping Use Never User                    Objective:          carvediloL  (COREG ) 6.25 mg tablet Take one tablet by mouth twice daily with meals. Take with food.    CHOLEcalciferoL (vitamin D3) (OPTIMAL D3) 50,000 units capsule Take one capsule by mouth every 7 days. 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    ferrous sulfate  (IRON ) 325 mg (65 mg iron ) tablet Take one tablet by mouth daily. Take on an empty stomach at least 1 hour before or 2 hours after food.    INTRAUTERINE DEVICE (IUD) IU by Intrauterine route.    levothyroxine  (SYNTHROID ) 125 mcg tablet Take one-half tablet by mouth daily.    loperamide  (IMODIUM  A-D) 2 mg capsule TAKE ONE CAPSULE BY MOUTH THREE TIMES DAILY, 30-45 MINUTES prior to meals (Patient taking differently: as Needed. TAKE ONE CAPSULE BY MOUTH THREE TIMES DAILY, 30-45 MINUTES prior to meals)    LORazepam  (ATIVAN ) 0.5 mg tablet Take 1 tab by mouth every 6 hrs as needed N/V not controlled by Zofran  or Compazine . May also use every 6 hrs as needed anxiety or at bedtime insomnia.    metFORMIN-XR (GLUCOPHAGE XR) 750 mg extended release tablet Take one tablet by mouth daily.    montelukast  (SINGULAIR ) 10 mg tablet Take one tablet by mouth at bedtime daily.    OLANZapine  (ZYPREXA ) 5 mg tablet Take one tablet by mouth at bedtime daily. Indications: nausea and vomiting caused by cancer drugs    ondansetron  HCL (ZOFRAN ) 8 mg tablet Take one tablet by mouth every 8 hours as needed for Nausea or Vomiting. Avoid on Days 1-2 of each chemotherapy cycle.    prochlorperazine  maleate (COMPAZINE ) 10 mg tablet Take one tablet by mouth every 6 hours as needed for Nausea or Vomiting.    rosuvastatin  (CRESTOR ) 5 mg tablet Take one tablet by mouth daily.    tamoxifen  (NOLVADEX ) 20 mg tablet TAKE 1 TABLET BY MOUTH EVERYDAY AT BEDTIME    tirzepatide (MOUNJARO) 7.5 mg/0.5 mL injector PEN Inject 0.5 mL under the skin every 7 days. wednesday     Vitals:    08/30/23 1415   BP:  98/69   BP Source: Arm, Left Upper   Pulse: 83   Temp: 36.3 ?C (97.3 ?F)   Resp: 16   SpO2: 98%   TempSrc: Temporal   PainSc: Zero   Weight: 112.6 kg (248 lb 3.2 oz)             Body mass index is 33.66 kg/m?SABRA     Pain Score: Zero       Fatigue Scale: 4    Pain Addressed:  N/A    Patient Evaluated for a Clinical Trial: Patient currently in screening for a treatment clinical trial.     Eastern Cooperative Oncology Group performance status is 0, Fully active, able to carry on all pre-disease performance without restriction.SABRA     Physical Exam  Vitals reviewed.   Constitutional:       General: She is not in acute distress.     Appearance: Normal appearance. She is well-developed. She is not ill-appearing, toxic-appearing or diaphoretic.   HENT:      Head: Normocephalic and atraumatic.      Nose: Nose normal. No rhinorrhea.      Mouth/Throat:      Mouth: Mucous membranes are moist. Mucous membranes are not pale. No oral lesions.      Pharynx: Oropharynx is clear. No oropharyngeal exudate or posterior oropharyngeal erythema.      Tonsils: No tonsillar abscesses.   Eyes:      General: No scleral icterus.        Right eye: No discharge.         Left eye: No discharge.      Extraocular Movements: Extraocular movements intact.      Conjunctiva/sclera: Conjunctivae normal.      Pupils: Pupils are equal, round, and reactive to light.   Cardiovascular:      Rate and Rhythm: Normal rate and regular rhythm.      Pulses: Normal pulses.      Heart sounds: Normal heart sounds. No murmur heard.     No gallop.   Pulmonary:      Effort: Pulmonary effort is normal. No respiratory distress.      Breath sounds: Normal breath sounds. No stridor. No wheezing, rhonchi or rales.   Chest:      Chest wall: No tenderness.   Abdominal:      General: Abdomen is flat. Bowel sounds are normal. There is no distension.      Palpations: Abdomen is soft. There is no mass.      Tenderness: There is no abdominal tenderness. There is no guarding or rebound.      Hernia: No hernia is present.   Musculoskeletal:         General: No swelling, tenderness, deformity or signs of injury. Normal range of motion.      Cervical back: Normal range of motion and neck supple. No rigidity. No muscular tenderness.      Right lower leg: No edema.      Left lower leg: No edema.   Lymphadenopathy:      Cervical: No cervical adenopathy.   Skin:     General: Skin is warm and dry.      Coloration: Skin is not jaundiced or pale.      Findings: No bruising, erythema, lesion or rash.   Neurological:      General: No focal deficit present.      Mental Status: She is alert and oriented to person, place, and time. Mental status is at  baseline.      Cranial Nerves: No cranial nerve deficit.      Motor: No weakness.      Coordination: Coordination normal.      Gait: Gait normal.   Psychiatric:         Mood and Affect: Mood normal.         Behavior: Behavior normal.         Thought Content: Thought content normal.         Judgment: Judgment normal.        07/23/2022  Pathology    Final Diagnosis:     A. 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      B. Colon, extended left hemi colectomy:    Adenocarcinoma moderately differentiated. See comment.   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 Additional proximal margin#1, excision:   Colon tissue with Tubular adenomas. See comment.   Three lymph nodes negative for metastatic carcinoma (0/3).     D. Additional proximal margins #2, excision:    Tubulovillous adenoma with focal high-grade dysplasia   Eleven lymph nodes negative for metastatic carcinoma (0/11).       Comment:   Pursuant to the Quality Assurance Program at the [facility] Pathology Department, selected slides from this case have been   concurrently reviewed by the following pathologist: Dr. Izetta Endow   who   agrees with the final diagnosis.       CASE SUMMARY: (COLON AND RECTUM: Resection)   Standard(s): AJCC-UICC 8   CAP Version: Colon and Rectum Resection 4.3.0.0     SPECIMEN     Procedure   ___ Subtotal (extended) left abdominal colectomy     Macroscopic Evaluation of Mesorectum (required for rectal cancers)   ___ Not applicable     TUMOR     Tumor Site (select all that apply)   ___ Transverse colon/Splenic flexure     Histologic Type   ___ Adenocarcinoma     Histologic Grade   ___ G2, moderately differentiated     Tumor Size   ___ Greatest dimension in Centimeters (cm): 4.0 x 3.8 x 0.7 cm     Multiple Primary Sites (e.g., hepatic flexure and transverse colon)   ___ Not applicable (no additional primary site(s) present)     Tumor Extent   ___ Invades through muscularis propria into the pericolonic or perirectal   tissue     Sub-mucosal Invasion (required only for pT1 tumors)   ___ Not applicable (not a pT1 tumor)     Macroscopic Tumor Perforation   ___ Not identified     Lymphatic and / or Vascular Invasion(select all that apply)   ___ Present (not otherwise specified)     Perineural Invasion   ___ Present     Tumor Budding Score   ___ Intermediate (5-9)     Treatment Effect   ___ No known presurgical therapy     MARGINS     Margin Status for Invasive Carcinoma   ___ All margins negative for invasive carcinoma   +Closest Margin(s) to Invasive Carcinoma (select all that apply)   ___ Radial (circumferential)/Mesenteric   +Distance from Invasive Carcinoma to Closest Margin   Specify in Centimeters (cm)   ___ Exact distance in cm: 3.4 cm   Distance from  Invasive Carcinoma to Radial (Circumferential) Margin   (required for rectal   tumors)   ___ Distance already reported as closest margin.   +Distance from Invasive Carcinoma to Distal Margin (recommended for rectal   tumors)   Specify in Centimeters (cm)   ___ Exact distance in cm: 58.0 cm   Margin(s) Involved by Invasive Carcinoma (select all that apply)   ___ Not applicable     Margin Status for Non-Invasive Tumor (select all that apply)   ___ All margins negative for high-grade dysplasia / intramucosal carcinoma   and low-grade dysplasia     REGIONAL LYMPH NODES     Regional Lymph Node Status   ___ Regional lymph nodes present   ___ Tumor present in regional lymph nodes   Number of Lymph Nodes with Tumor   ___ Exact number (specify): 5   Number of Lymph Nodes Examined   ___ Exact number (specify): 34     Tumor Deposits   ___ Not identified     DISTANT METASTASIS     Distant Site(s) Involved, if applicable (select all that apply)   ___ Not applicable     pTNM CLASSIFICATION (AJCC 8th Edition)   Reporting of pT, pN, and (when applicable) pM categories is based on   information available to the pathologist at the time the report is issued.   As per the AJCC (Chapter 1, 8th Ed.) it is the managing physician's   responsibility to establish the final pathologic stage based upon all   pertinent information, including but potentially not limited to this   pathology report.     Modified Classification (required only if applicable) (select all that   apply)   ___ Not applicable     pT Category   ___ pT3: Tumor invades through the muscularis propria into pericolorectal   tissues     T Suffix (required only if applicable)   ___ Not applicable     pN Category   pN2: Four or more regional nodes are positive   ___ pN2a: Four to six regional lymph nodes are positive     pM Category (required only if confirmed pathologically)   ___ Not applicable - pM cannot be determined from the submitted   specimen(s)     ADDITIONAL FINDINGS     +Additional Findings (select all that apply) ___ Adenoma(s) (Tubulovillous adenoma with high grade dysplasia, 6.3 cm.   1.9 cm., rectosigmoid; tubular adenomas)      Latest Reference Range & Units 08/30/23 13:06   White Blood Cells 4.50 - 11.00 10*3/uL 5.90   Hemoglobin 12.0 - 15.0 g/dL 88.1 (L)   Hematocrit 63.9 - 45.0 % 35.0 (L)   Platelet Count 150 - 400 10*3/uL 252   Neutrophils 41.0 - 77.0 % 66.8   Absolute Neutrophil Count 1.80 - 7.00 10*3/uL 3.90   Lymphocytes 24.0 - 44.0 % 23.0 (L)   Absolute Lymph Count 1.00 - 4.80 10*3/uL 1.40   Monocytes 4.0 - 12.0 % 6.5   Absolute Monocyte Count 0.00 - 0.80 10*3/uL 0.40   Eosinophils 0.0 - 5.0 % 2.7   Absolute Eosinophil Count 0.00 - 0.45 10*3/uL 0.20   Absolute Basophil Count 0.00 - 0.20 10*3/uL 0.10   Basophils 0.0 - 2.0 % 1.0   RBC 4.00 - 5.00 10*6/uL 4.03   MCV 80.0 - 100.0 fL 86.9   MCH 26.0 - 34.0 pg 29.3   MCHC 32.0 - 36.0 g/dL 66.1   MPV 7.0 - 88.9 fL 7.3  RDW 11.0 - 15.0 % 14.2   (L): Data is abnormally low   Latest Reference Range & Units 08/30/23 14:05   Sodium 137 - 147 mmol/L 137   Potassium 3.5 - 5.1 mmol/L 4.2   Chloride 98 - 110 mmol/L 101   CO2 21 - 30 mmol/L 29   Anion Gap 3 - 12  7   Blood Urea Nitrogen 7 - 25 mg/dL 13   Creatinine 9.59 - 1.00 mg/dL 8.67 (H)   Glomerular Filtration Rate (GFR) >60 mL/min 51 (L)   Glucose 70 - 100 mg/dL 90   Albumin  3.5 - 5.0 g/dL 4.1   Calcium 8.5 - 89.3 mg/dL 9.4   Total Bilirubin 0.2 - 1.3 mg/dL 0.5   Total Protein 6.0 - 8.0 g/dL 7.2   AST (SGOT) 7 - 40 U/L 19   ALT (SGPT) 7 - 56 U/L 7   Alk Phosphatase 25 - 110 U/L 46   (H): Data is abnormally high  (L): Data is abnormally low    08/30/23 CT:  CHEST:   No thoracic metastatic disease.     ABDOMEN AND PELVIS:   Extended partial colectomy. No evidence of local recurrence or   abdominopelvic metastatic disease.     Assessment and Plan:    48 yr old female with history of right breast cancer (07/2019): ER/PR+, HER2- IDC pT1c N1a M0 (1 of 34 LN+) M0.  on  tamoxifen  and zoladex . Who has newly diagnosed left colon adenocarcinoma s/P surgery: pT3N2 (LN 5/48) mod diff , positive for  Lymphatic and / or Vascular Invasion, and Perineural Invasion.     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 Doing well, no issues. No F/C/N/V/D.  Images -- NED  Lab no issues other than mildly elevated Cr. Discussed hydration and avoiding nephrotoxic drugs.  Colonoscopy 04/13/23 no issues.    Explaned images and lab to the pt.    -- cont surveillance in 6 months with images and labs  -- Schedule next colonoscopy in 3 years (Dr. Gladis).  -- Continue follow breast cancer care team  -- Continue supportive care.   -- Pt to call with any questions, concerns or symptoms at any time and we can see her sooner.       The patient and family had a chance to ask questions, all been answered to their satisfaction.     Patient seen and discussed with Dr. Austin.    Nanuli Gvazava, M.D.  Hematology/Oncology fellow, PGY6.            Attending Note:    I saw and  examined the patient with Dr. Gvazav . I agree with the history, review of system, data,  and I am involved in and guided examination, and assessment/plan.       48 yr old female with history of right breast cancer (07/2019): ER/PR+, HER2- IDC pT1c N1a M0 (1 of 34 LN+) M0.  on  tamoxifen  and zoladex . Who had diagnosed left colon adenocarcinoma (07/2022) s/P surgery: pT3N2 (LN 5/48) mod diff , positive for  Lymphatic and / or Vascular Invasion, and Perineural Invasion.     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 Doing well, no issues. No  F/C/N/V/D.  Images -- NED  Lab no issues other than mildly elevated Cr. Discussed hydration and avoiding nephrotoxic drugs.  Colonoscopy 04/13/23 no issues.    Explaned images and lab to the pt.    -- cont surveillance in 6 months with images and labs  -- Schedule next colonoscopy in 3 years (Dr. Gladis).  -- Continue follow breast cancer care team  -- Continue supportive care.   -- Pt to call with any questions, concerns or symptoms at any time and we can see her sooner.       The patient  had a chance to ask questions, all been answered to her satisfaction.       Harden Repress, MD, GENI

## 2023-08-29 ENCOUNTER — Encounter: Admit: 2023-08-29 | Discharge: 2023-08-29 | Payer: PRIVATE HEALTH INSURANCE

## 2023-08-30 ENCOUNTER — Encounter: Admit: 2023-08-30 | Discharge: 2023-08-30 | Payer: PRIVATE HEALTH INSURANCE

## 2023-08-30 ENCOUNTER — Ambulatory Visit: Admit: 2023-08-30 | Discharge: 2023-08-30 | Payer: PRIVATE HEALTH INSURANCE

## 2023-08-30 DIAGNOSIS — C186 Malignant neoplasm of descending colon: Principal | ICD-10-CM

## 2023-08-30 LAB — CBC AND DIFF
~~LOC~~ BKR ABSOLUTE BASO COUNT: 0.1 10*3/uL (ref 0.00–0.20)
~~LOC~~ BKR ABSOLUTE EOS COUNT: 0.2 10*3/uL (ref 0.00–0.45)
~~LOC~~ BKR ABSOLUTE LYMPH COUNT: 1.4 10*3/uL (ref 1.00–4.80)
~~LOC~~ BKR ABSOLUTE MONO COUNT: 0.4 10*3/uL (ref 0.00–0.80)
~~LOC~~ BKR ABSOLUTE NEUTROPHIL: 3.9 10*3/uL (ref 1.80–7.00)
~~LOC~~ BKR BASOPHILS %: 1 % (ref 0.0–2.0)
~~LOC~~ BKR EOSINOPHILS %: 2.7 % (ref 0.0–5.0)
~~LOC~~ BKR HEMATOCRIT: 35 % — ABNORMAL LOW (ref 36.0–45.0)
~~LOC~~ BKR HEMOGLOBIN: 11 g/dL — ABNORMAL LOW (ref 12.0–15.0)
~~LOC~~ BKR LYMPHOCYTES %: 23 % — ABNORMAL LOW (ref 24.0–44.0)
~~LOC~~ BKR MCH: 29 pg (ref 26.0–34.0)
~~LOC~~ BKR MCHC: 33 g/dL (ref 32.0–36.0)
~~LOC~~ BKR MCV: 86 fL (ref 80.0–100.0)
~~LOC~~ BKR MONOCYTES %: 6.5 % (ref 4.0–12.0)
~~LOC~~ BKR MPV: 7.3 fL (ref 7.0–11.0)
~~LOC~~ BKR NEUTROPHILS %: 66 % (ref 41.0–77.0)
~~LOC~~ BKR PLATELET COUNT: 252 10*3/uL (ref 150–400)
~~LOC~~ BKR RBC COUNT: 4 10*6/uL (ref 4.00–5.00)
~~LOC~~ BKR RDW: 14 % (ref 11.0–15.0)
~~LOC~~ BKR WBC COUNT: 5.9 10*3/uL (ref 4.50–11.00)

## 2023-08-30 NOTE — Patient Instructions
 Dr. Miguel Rota and Lianne Moris, APRN-NP  Nurses: Lucillie Garfinkel, BSN, RN & Erenest Rasher, BSN, RN  Phone: [phone]   Fax: [phone]  Available Monday - Friday 8:00 am - 4:00 pm    SCHEDULING NEEDS: [phone]    On-call number for urgent needs outside of business hours: [phone]  Ask for the on-call Oncology fellow to be paged and they will call you back.    Medication refills: Please contact your pharmacy FIRST for medication refills; if they do not have any refills on file, they will contact the office. Make sure they have our correct contact information on file.  P: [phone], F: [phone]    MyChart Messages: All messages come to the nurse and we discuss with Dr. Wynelle Link and Lianne Moris, APRN. Please make sure to select Dr. Wynelle Link or Denny Peon when sending a message. Messages are answered 8:00 am - 4:00 pm Monday - Friday, holidays excluded. Please keep in mind we have 4 hours to respond to your messages.    Phone Calls: Our phone number is strictly a Camera operator. We check these messages all day and will get back with you. Please state your full name, date of birth, and reason for your call with as much detail as possible. Please keep in mind we have 4 hours to return your call.    FMLA/Insurance/Disability paperwork: Please fill out as much of the paperwork as you are able. We request that you give [place] 7-10 business days to complete this as we have a high volume of paperwork to complete for our patients.

## 2023-09-02 ENCOUNTER — Encounter: Admit: 2023-09-02 | Discharge: 2023-09-02 | Payer: PRIVATE HEALTH INSURANCE

## 2023-09-02 DIAGNOSIS — C186 Malignant neoplasm of descending colon: Principal | ICD-10-CM

## 2023-09-04 ENCOUNTER — Encounter: Admit: 2023-09-04 | Discharge: 2023-09-04 | Payer: PRIVATE HEALTH INSURANCE

## 2023-09-05 ENCOUNTER — Encounter: Admit: 2023-09-05 | Discharge: 2023-09-05 | Payer: PRIVATE HEALTH INSURANCE

## 2023-09-07 ENCOUNTER — Encounter: Admit: 2023-09-07 | Discharge: 2023-09-07 | Payer: PRIVATE HEALTH INSURANCE

## 2023-09-07 DIAGNOSIS — C50411 Malignant neoplasm of upper-outer quadrant of right female breast: Principal | ICD-10-CM

## 2023-09-07 MED ORDER — GOSERELIN 10.8 MG SC IMPL
10.8 mg | Freq: Once | SUBCUTANEOUS | 0 refills | Status: CP
Start: 2023-09-07 — End: ?
  Administered 2023-09-07: 21:00:00 10.8 mg via SUBCUTANEOUS

## 2023-09-07 MED ORDER — LIDOCAINE (PF) 10 MG/ML (1 %) IJ SOLN
2 mL | Freq: Once | SUBCUTANEOUS | 0 refills | Status: AC
Start: 2023-09-07 — End: ?

## 2023-09-07 NOTE — Progress Notes
 Patient arrived to CC treatment for injection. Zoladex  given in RLQ of abdomen and tolerated without difficulty.  No pertinent changes since last assessment. All questions and concerns addressed. Pt. left CC treatment in stable condition.    To-Do List       Future Appointments Provider Department Dept Phone    09/07/2023  4:30 PM INJECTIONS IN LAB LEVEL 3 Oncology: [facility] [place] [phone]    11/30/2023 3:00 PM INJECTIONS IN LAB LEVEL 3 Oncology: [facility] [place] [phone]    01/04/2024 3:00 PM Lafaver, Corean HERO, APRN-NP Oncology: [facility], [facility] [place] [phone]    02/28/2024 7:00 AM NURSE CHAIR IN LAB LEVEL 2 Oncology: [facility] [place] [phone]    02/28/2024 7:30 AM [facility] Imaging, CT: [facility] [phone]    02/28/2024 10:20 AM Austin Ferrara, MD Oncology: [facility], [facility] [place] [phone]

## 2023-09-09 ENCOUNTER — Encounter: Admit: 2023-09-09 | Discharge: 2023-09-09 | Payer: PRIVATE HEALTH INSURANCE

## 2023-11-03 ENCOUNTER — Encounter: Admit: 2023-11-03 | Discharge: 2023-11-03 | Payer: PRIVATE HEALTH INSURANCE

## 2023-11-03 MED ORDER — GOSERELIN 10.8 MG SC IMPL
10.8 mg | Freq: Once | SUBCUTANEOUS | 0 refills
Start: 2023-11-03 — End: ?

## 2023-11-03 MED ORDER — LIDOCAINE (PF) 10 MG/ML (1 %) IJ SOLN
2 mL | Freq: Once | SUBCUTANEOUS | 0 refills
Start: 2023-11-03 — End: ?

## 2023-11-07 ENCOUNTER — Encounter: Admit: 2023-11-07 | Discharge: 2023-11-07 | Payer: PRIVATE HEALTH INSURANCE

## 2023-11-07 NOTE — Research Notes
 PI letter of change dated 10/14/2023 was sent by mail with postage paid envelope included for signature and return to the clinic.

## 2023-11-09 ENCOUNTER — Encounter: Admit: 2023-11-09 | Discharge: 2023-11-09 | Payer: PRIVATE HEALTH INSURANCE

## 2023-12-03 IMAGING — US BREASTLT
1 series · 13 of 25 positions shown · non-contrast
Comparison: none

[Series 1: us breast left limited · 46 acquisitions, 13 frames shown]
[im 1/46]
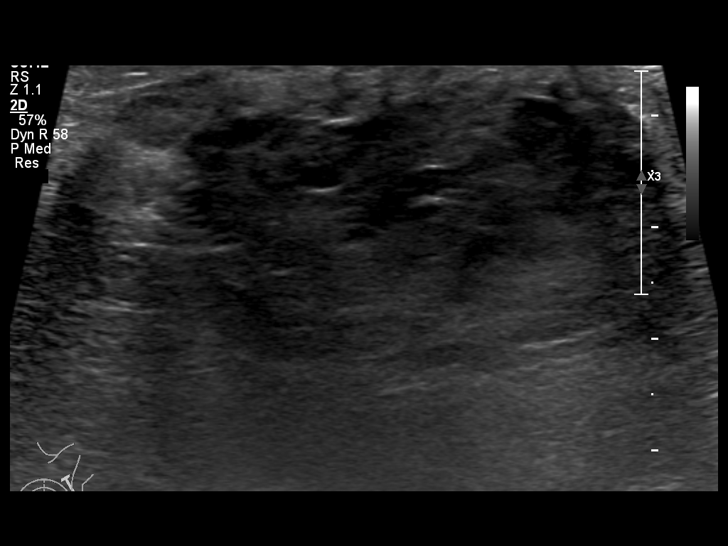
[im 4/46]
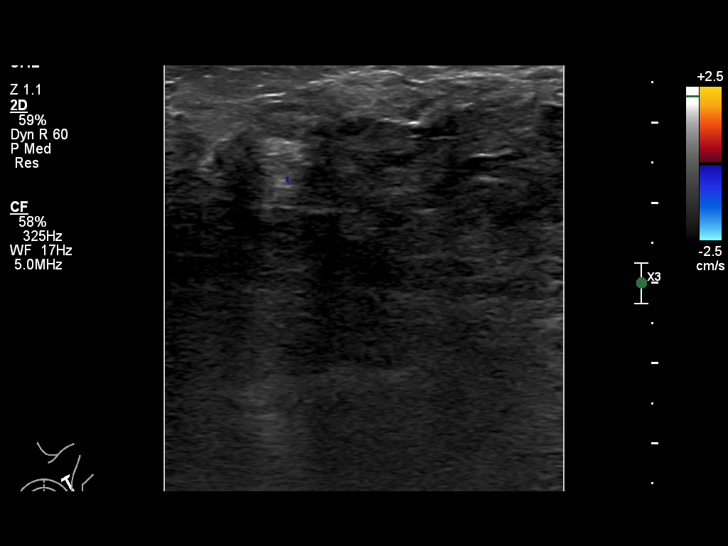
[im 8/46]
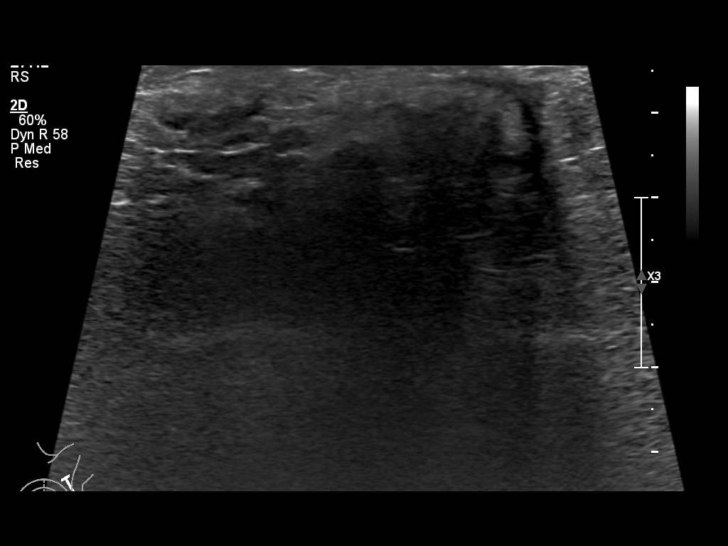
[im 12/46]
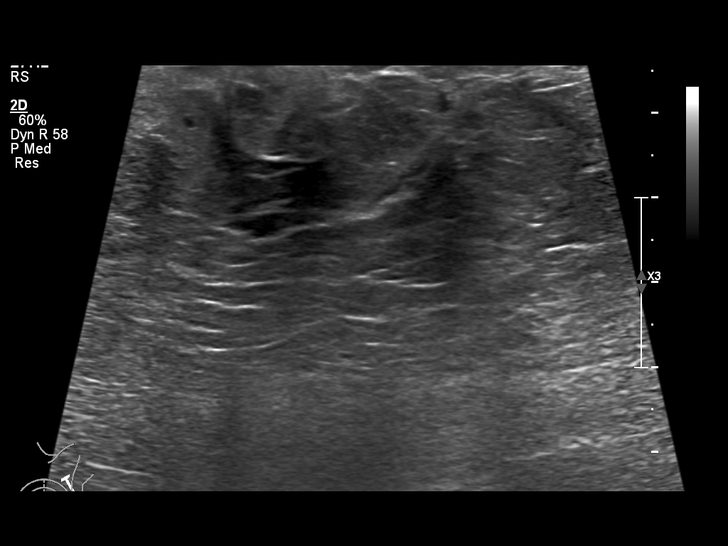
[im 16/46]
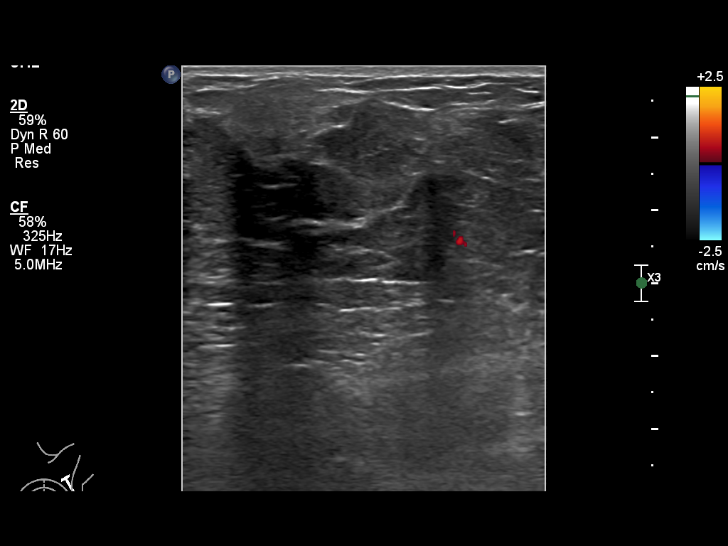
[im 19/46]
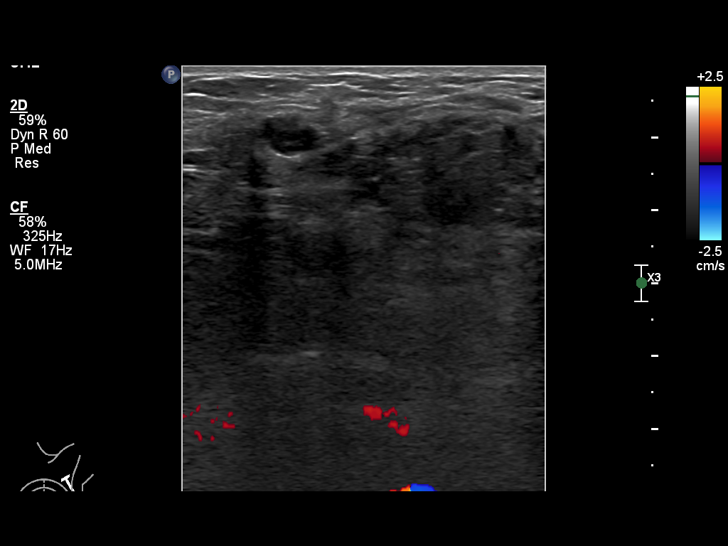
[im 23/46]
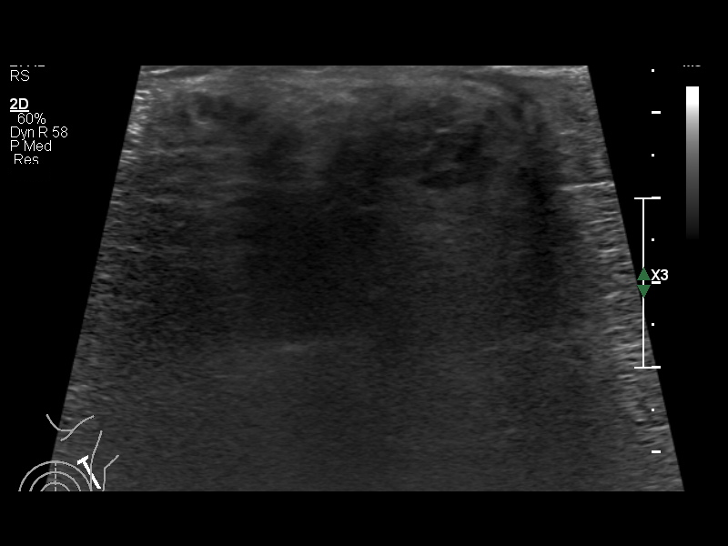
[im 27/46]
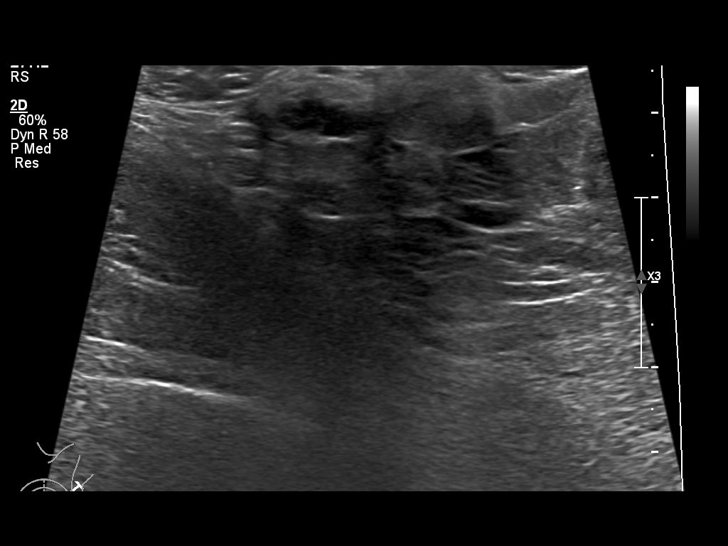
[im 31/46]
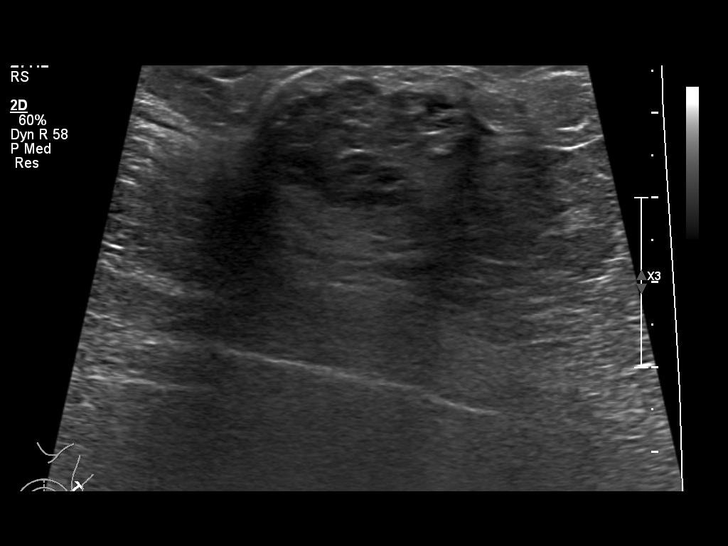
[im 34/46]
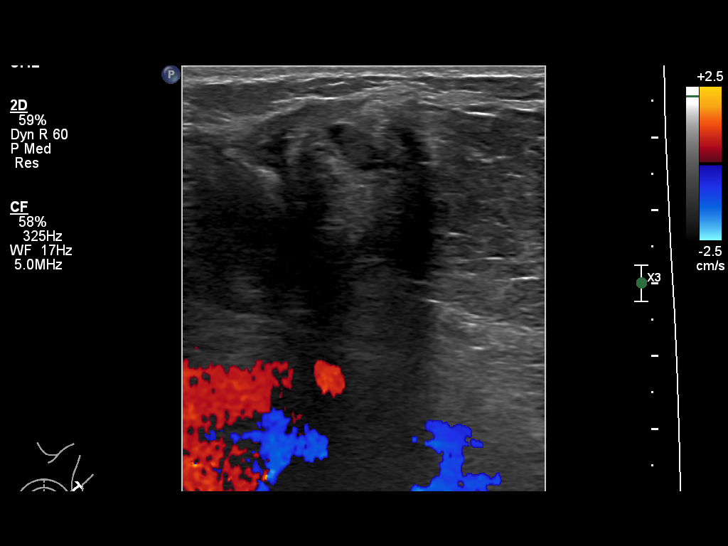
[im 38/46]
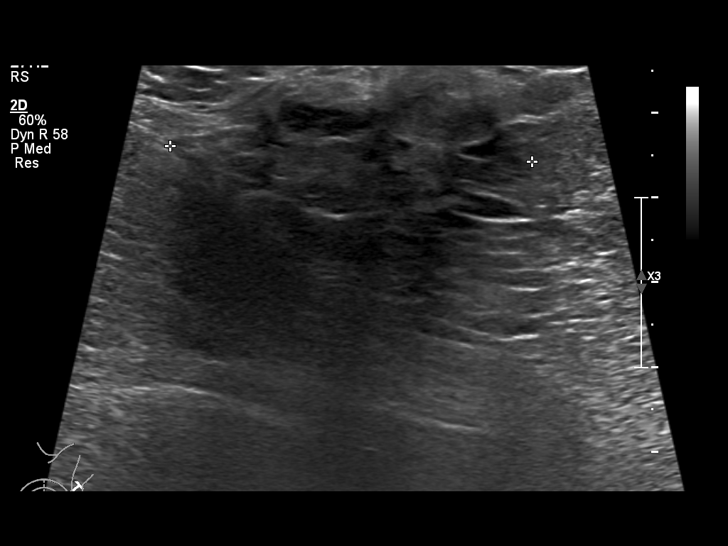
[im 42/46]
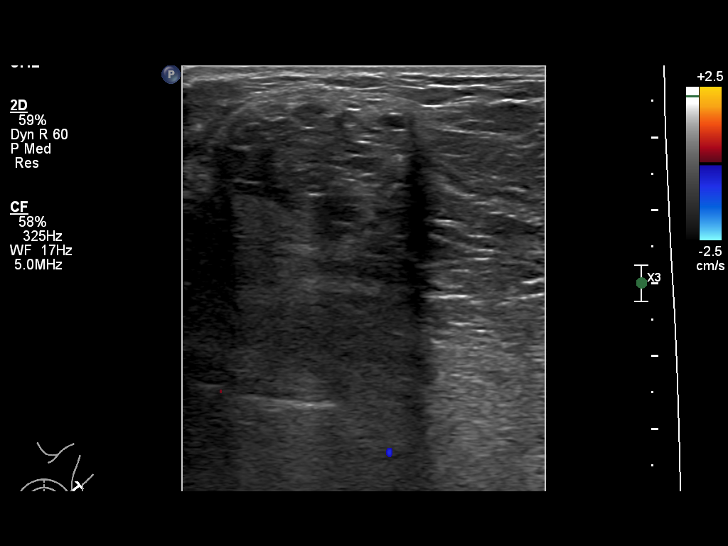
[im 46/46]
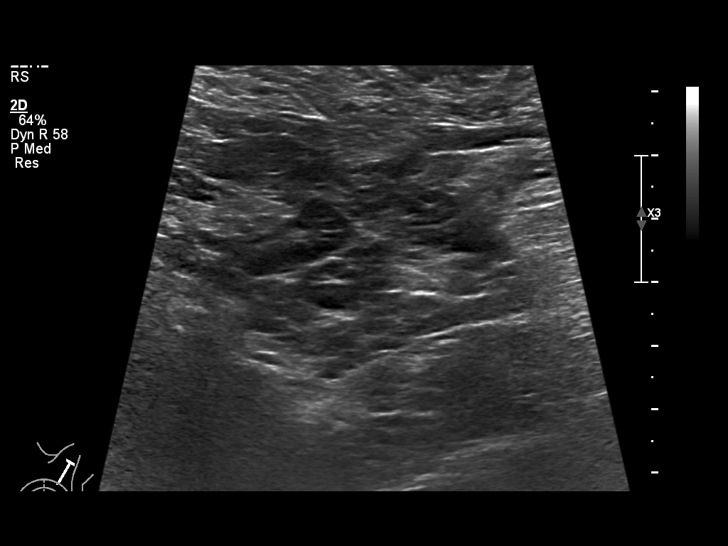

[13 of 25 positions shown; findings below may reference images not displayed]

DIAGNOSTIC STUDIES

US BREAST

EXAM

INDICATION

malignant neoplasm of upper right quadrant of righ
palpable lump lt breast x1 month; hx of rt breast ca; bil mastectomy in June 2019; bil deep flap
procedure in June 2020; family hx of breast ca (mother age 50) and (aunt age 52)

TECHNIQUE

Grayscale and Color Doppler imaging evaluation in the region of concern was performed

COMPARISONS

None available

FINDINGS

Patient reports palpable abnormality at 2 o'clock in the left breast laterally. Ultrasound this
area demonstrates an ill-defined area of heterogeneous tissue measuring 4.3 cm. This likely reflects
postoperative change related to prior flap reconstruction. Conceivably fat necrosis could also have
this appearance. Patient reports upcoming follow-up appointments at the [facility] [REDACTED] in April 2021. Short-term follow-up ultrasound coinciding with that visit is recommended to confirm
stability.

IMPRESSION

Probable postoperative changes accounting for patient's palpable abnormality. Short-term follow-up
ultrasound at the [facility] center coinciding with patient's schedule visit is recommended to
confirm stability.

BI - RADS 3

Tech Notes:

palpable lump lt breast x1 month; hx of rt breast ca; bil mastectomy in June 2019; bil deep flap
procedure in June 2020; family hx of breast ca (mother age 50) and (aunt age 52)

## 2023-12-05 ENCOUNTER — Encounter: Admit: 2023-12-05 | Discharge: 2023-12-05 | Payer: PRIVATE HEALTH INSURANCE

## 2023-12-05 DIAGNOSIS — C50411 Malignant neoplasm of upper-outer quadrant of right female breast: Principal | ICD-10-CM

## 2023-12-05 MED ORDER — LIDOCAINE (PF) 10 MG/ML (1 %) IJ SOLN
2 mL | Freq: Once | SUBCUTANEOUS | 0 refills
Start: 2023-12-05 — End: ?

## 2023-12-05 MED ORDER — GOSERELIN 10.8 MG SC IMPL
10.8 mg | Freq: Once | SUBCUTANEOUS | 0 refills
Start: 2023-12-05 — End: ?

## 2023-12-09 ENCOUNTER — Encounter: Admit: 2023-12-09 | Discharge: 2023-12-09 | Payer: PRIVATE HEALTH INSURANCE

## 2023-12-09 VITALS — BP 91/66 | HR 75 | Temp 98.50000°F | Resp 16

## 2023-12-09 DIAGNOSIS — C50411 Malignant neoplasm of upper-outer quadrant of right female breast: Principal | ICD-10-CM

## 2023-12-09 MED ORDER — GOSERELIN 10.8 MG SC IMPL
10.8 mg | Freq: Once | SUBCUTANEOUS | 0 refills | Status: CP
Start: 2023-12-09 — End: ?
  Administered 2023-12-09: 23:00:00 10.8 mg via SUBCUTANEOUS

## 2023-12-09 MED ORDER — LIDOCAINE (PF) 10 MG/ML (1 %) IJ SOLN
2 mL | Freq: Once | SUBCUTANEOUS | 0 refills | Status: AC
Start: 2023-12-09 — End: ?

## 2023-12-09 NOTE — Progress Notes [1]
 Patient arrived for injection. Zoladex  given in the LLQ and tolerated without difficulty.  No pertinent changes since last assessment. All questions and concerns addressed. Pt. left in stable condition.    To-Do List       Future Appointments Provider Department Dept Phone    01/04/2024 3:00 PM Lafaver, Corean HERO, APRN-NP Oncology: [facility] [place] [phone]    02/28/2024 7:00 AM NURSE CHAIR IN LAB LEVEL 2 Oncology: [facility] [place] [phone]    02/28/2024 7:30 AM [facility] Imaging, CT: [facility] [phone]    02/28/2024 10:20 AM Austin Ferrara, MD Oncology: [facility], [facility] [place] [phone]    03/14/2024 8:30 AM Olean Donald HERO, MD Dermatology: [facility] [place] [phone]

## 2023-12-29 ENCOUNTER — Encounter: Admit: 2023-12-29 | Discharge: 2023-12-29 | Payer: PRIVATE HEALTH INSURANCE

## 2024-01-03 ENCOUNTER — Encounter: Admit: 2024-01-03 | Discharge: 2024-01-03 | Payer: PRIVATE HEALTH INSURANCE

## 2024-01-04 ENCOUNTER — Encounter: Admit: 2024-01-04 | Discharge: 2024-01-04 | Payer: PRIVATE HEALTH INSURANCE

## 2024-01-04 VITALS — BP 103/78 | HR 81 | Temp 97.30000°F | Resp 16 | Wt 244.2 lb

## 2024-01-04 DIAGNOSIS — C50411 Malignant neoplasm of upper-outer quadrant of right female breast: Principal | ICD-10-CM

## 2024-01-04 NOTE — Progress Notes [1]
 Name: Jennifer Durham          MRN: [id number]      DOB: 25-Sep-1975      AGE: 48 y.o.   DATE OF SERVICE: 01/04/2024    Subjective:             Reason for Visit:  Follow Up      Jennifer Durham is a 48 y.o. female.      Cancer Staging   Colon cancer (CMS-HCC)  Staging form: Colon And Rectum, AJCC 8th Edition  - Clinical stage from 07/23/2022: Stage IIIB (cT3, cN2a, cM0) - Signed by Austin Ferrara, MD on 08/11/2022    Malignant neoplasm of upper-outer quadrant of right breast in female, estrogen receptor positive (CMS-HCC)  Staging form: Breast, AJCC 8th Edition  - Clinical stage from 07/04/2019: Stage IA (cT1c, cN0(f), cM0, G2, ER+, PR+, HER2-) - Signed by Gean Lucienne PARAS, PA-C on 07/12/2019  - Pathologic stage from 08/21/2019: Stage IA (pT1c, pN1a, cM0, G2, ER+, PR+, HER2-) - Signed by Gean Lucienne PARAS, PA-C on 08/30/2019      History of Present Illness    DIAGNOSIS: right breast cancer   PAST ONCOLOGY HISTORY: Jennifer Durham is a 48 year old pre menopausal female who noted right breast dimpling in May 2021. She was sent for diagnostic imaging at an outside facility on 07/04/19. At that time a new right breast mass was seen as well as a lymph node with cortical thickening and biopsy was recommended. Right breast biopsy 07/04/2019 [facility]) showed invasive carcinoma with mixed ductal and lobular features, grade II, ER 91-100%, PR 91-100%, HER2 1+ IHC. Right axilla biopsy showed fragments of polymorphous lymphoid tissue and no malignancy seen. Jennifer Durham underwent right modified radical mastectomy on 08/21/2019.  Tumor size was 1.8 cm, invasive ductal carcinoma.  Grade 2.  1 out of 34 lymph nodes was positive.  The size of LN metastasis was 5 mm.  ER was 96%, PR 99%,, HER-2 0+ by IHC.  Oncotype DX score 11. We recommended adjuvant adjuvant TC every 3 weeks x4 started 09/26/2019. She completed 4 cycles 11/28/2019.      BREAST IMAGING:  Mammogram:    -- Bilateral diagnostic mammogram 07/04/19 [facility]) revealed heterogeneously dense breast tissue. Spiculated mass with surrounding architectural distortion measuring up to 2 cm in the upper right medial breast at 10-11:00, 7.7 cm posterior to the nipple. Slightly prominent asymmetric right axillary lymph nodes.  -- Right diagnostic mammogram 07/16/19 ([facility]) revealed an the outside right MLO view from 07/04/2019, there was a small mass in the probable 9:00 position of the right breast at posterior depth, 2 cm inferior and 2 cm posterior to the known malignancy. Additional mammographic images will be performed to determine location. 2-D and 3-D images of the right breast were obtained.   In the 9:30 to 10:00 position of the right breast at middle to posterior depth, there was a 1.7 x 1.9 cm spiculated mass with internal tissue marker clip corresponding to biopsy-proven   malignancy. The biopsy-proven malignancy is approximately 8 cm posterior to the right nipple. In the 8:30 to 9:00 position of the right breast at posterior depth, there was an 0.8 cm lobular equal density mass which was not definitively seen on 2-D mammogram images from 03/21/2013.      Ultrasound:    -- Right breast ultrasound 07/04/19 [facility]) revealed hypoechoic spiculated mass with surrounding architectural distortion with in the right upper breast at 10:00, 6 cm FTN. Significant posterior acoustic shadowing.  Overall extent 1.3 x 1.7 x 1.6 cm. Increased cortical mantle thickening of a right axillary lymph node measuring up to 4 mm. BI-RADS 5.   -- Targeted right breast ultrasound 07/16/19 ([facility]) revealed at 9:30, 9 cm from the nipple, demonstrated a 1.4 x 1.8 x 1.6 cm irregular hypoechoic mass with internal tissue marker clip corresponding to the biopsy-proven malignancy. In the right breast at 9:00, 12 cm from the nipple, there ws an 0.8 cm morphologically normal small  intramammary lymph node corresponding to the additional mammographic mass identified. No suspicious right axillary lymph nodes were seen. 3 morphologically normal right axillary lymph nodes were identified. The lymph node labeled #3, likely contains the internal tissue marker clip from outside biopsy.     Karmin underwent right modified radical mastectomy on 08/21/2019.  Tumor size was 1.8 cm, invasive ductal carcinoma.  Grade 2.  1 out of 34 lymph nodes was positive.  The size of LN metastasis was 5 mm.  ER was 96%, PR 99%,, HER-2 0+ by IHC. Oncotype Dx RS 11.   She completed 4 cycles of adjuvant TC 11/28/2019.    She completed radiation with Dr Jolaine. She is enrolled on the MA39 trail.     She completed radiation with Dr Jolaine 03/11/2020.            OB/GYN HISTORY: Age at onset of menstruation 13. G2P2. She had her first child at age 30. LMP 03/2016 due to Mirena placement. Uterus and ovaries intact.     PRESENT THERAPY: Tamoxifen  20mg  daily started 12/2019 + Zoladex  every 3 months    Jennifer Durham is here today for continued follow up.    Review of Systems   Constitutional:         + hot flashes    Genitourinary:         Pre-menopausal   LMP 03/2016  Zoladex    Uterus and ovaries intact    Psychiatric/Behavioral:  The patient is nervous/anxious.        Allergies   Allergen Reactions    Tegaderm RASH       Objective:          carvediloL  (COREG ) 6.25 mg tablet Take one tablet by mouth twice daily with meals. Take with food.    CHOLEcalciferoL (vitamin D3) (OPTIMAL D3) 50,000 units capsule Take one capsule by mouth every 7 days. 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    ferrous sulfate  (IRON ) 325 mg (65 mg iron ) tablet Take one tablet by mouth daily. Take on an empty stomach at least 1 hour before or 2 hours after food.    INTRAUTERINE DEVICE (IUD) IU by Intrauterine route.    levothyroxine  (SYNTHROID ) 125 mcg tablet Take one-half tablet by mouth daily.    loperamide  (IMODIUM  A-D) 2 mg capsule TAKE ONE CAPSULE BY MOUTH THREE TIMES DAILY, 30-45 MINUTES prior to meals (Patient taking differently: as Needed. TAKE ONE CAPSULE BY MOUTH THREE TIMES DAILY, 30-45 MINUTES prior to meals)    LORazepam  (ATIVAN ) 0.5 mg tablet Take 1 tab by mouth every 6 hrs as needed N/V not controlled by Zofran  or Compazine . May also use every 6 hrs as needed anxiety or at bedtime insomnia.    metFORMIN-XR (GLUCOPHAGE XR) 750 mg extended release tablet Take one tablet by mouth daily.  montelukast  (SINGULAIR ) 10 mg tablet Take one tablet by mouth at bedtime daily.    OLANZapine  (ZYPREXA ) 5 mg tablet Take one tablet by mouth at bedtime daily. Indications: nausea and vomiting caused by cancer drugs    ondansetron  HCL (ZOFRAN ) 8 mg tablet Take one tablet by mouth every 8 hours as needed for Nausea or Vomiting. Avoid on Days 1-2 of each chemotherapy cycle.    prochlorperazine  maleate (COMPAZINE ) 10 mg tablet Take one tablet by mouth every 6 hours as needed for Nausea or Vomiting.    rosuvastatin  (CRESTOR ) 5 mg tablet Take one tablet by mouth daily.    tamoxifen  (NOLVADEX ) 20 mg tablet TAKE 1 TABLET BY MOUTH EVERYDAY AT BEDTIME    tirzepatide (MOUNJARO) 7.5 mg/0.5 mL injector PEN Inject 0.5 mL under the skin every 7 days. wednesday     Vitals:    01/04/24 1511   BP: 103/78   BP Source: Arm, Left Upper   Pulse: 81   Temp: 36.3 ?C (97.3 ?F)   Resp: 16   SpO2: 98%   TempSrc: Temporal   PainSc: Zero   Weight: 110.8 kg (244 lb 3.2 oz)     Body mass index is 33.12 kg/m?Jennifer Durham     Pain Score: Zero       Fatigue Scale: 0-None    Pain Addressed:  N/A    Patient Evaluated for a Clinical Trial: No treatment clinical trial available for this patient.     [employer]cology Group performance status is 0, Fully active, able to carry on all pre-disease performance without restriction.Jennifer Durham     Physical Exam  Vitals and nursing note reviewed.   Pulmonary:      Effort: No respiratory distress.   Chest:   Breasts:     Right: No mass, skin change or tenderness.      Left: No mass, skin change or tenderness.          Comments: No palpable abnormalities on examination today.  Lymphadenopathy:      Cervical: No cervical adenopathy.      Upper Body:      Right upper body: No supraclavicular or axillary adenopathy.      Left upper body: No supraclavicular or axillary adenopathy.       LABS:  Comprehensive Metabolic Profile    Lab Results   Component Value Date/Time    NA 137 08/30/2023 02:05 PM    K 4.2 08/30/2023 02:05 PM    CL 101 08/30/2023 02:05 PM    GLU 90 08/30/2023 02:05 PM    BUN 13 08/30/2023 02:05 PM    CR 1.32 (H) 08/30/2023 02:05 PM    CA 9.4 08/30/2023 02:05 PM    TOTPROT 7.2 08/30/2023 02:05 PM    Lab Results   Component Value Date/Time    TOTBILI 0.5 08/30/2023 02:05 PM    ALBUMIN  4.1 08/30/2023 02:05 PM    ALKPHOS 46 08/30/2023 02:05 PM    AST 19 08/30/2023 02:05 PM    CO2 29 08/30/2023 02:05 PM    ALT 7 08/30/2023 02:05 PM    GAP 7 08/30/2023 02:05 PM    EGFR1 50 (L) 11/23/2022 07:13 AM         Estradiol 04/07/2020: <15.0  Estradiol 10/28/2020: <15.0  Estradiol 05/01/2021: <15.0  Estradiol 11/06/2021: 80.9          Assessment and Plan:    1. 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   2.  Jennifer Durham underwent right modified radical mastectomy on 08/21/2019.  Tumor size was 1.8 cm, invasive ductal carcinoma.  Grade 2.  1 out of 34 lymph nodes was positive.  The size of LN metastasis was 5 mm.  ER was 96%, PR 99%,, HER-2 0+ by IHC. Oncotype Dx RS 11.   3.  Since Jennifer Durham is premenopausal and has node positive disease, we recommend adjuvant chemotherapy.  After discussing the options of AC-T chemotherapy versus TC chemotherapy, we decided to proceed with TC for four cycles.  We discussed the data based on ABC trials and breast cancer.  Jennifer Durham has significant history of heart disease in the family and is very concerned about Adriamycin causing heart damage.  Therefore TC seemed like better choice with equivalent efficacy based on trials.  She completed 4 cycles of TC 11/28/2019.    4. She completed radiation with Dr Jolaine. She is enrolled on the MA39 trail.   5. Due to her cancer being ER+; we recommended endocrine therapy for 5-10 years. She started Tamoxifen  20mg  daily 12/2019. She was told the side effects including hot flashes, uterine cancer, blood clots and cataracts.  Continue Zoladex  every 3 months, due every 3 months. She will continue to see GYN annually for pelvis exam.   6. Hot flashes and mood changes; continue Lexapro  10mg .  7. Stage III colon cancer; currently recieving chemotherapy with Dr Austin.   8. RTC in 6 months for a breast cancer follow-up visit.     My collaborating MD Dr Verlean Bathe.   Total Time Today was 30 minutes in the following activities: Preparing to see the patient, Obtaining and/or reviewing separately obtained history, Performing a medically appropriate examination and/or evaluation, Counseling and educating the patient/family/caregiver, Ordering medications, tests, or procedures, and Documenting clinical information in the electronic or other health record    Jennifer CHRISTELLA Golas, APRN-NP

## 2024-02-02 ENCOUNTER — Encounter: Admit: 2024-02-02 | Discharge: 2024-02-02 | Payer: PRIVATE HEALTH INSURANCE

## 2024-02-03 ENCOUNTER — Encounter: Admit: 2024-02-03 | Discharge: 2024-02-03 | Payer: PRIVATE HEALTH INSURANCE

## 2024-02-11 ENCOUNTER — Encounter: Admit: 2024-02-11 | Discharge: 2024-02-11 | Payer: PRIVATE HEALTH INSURANCE

## 2024-02-22 ENCOUNTER — Encounter: Admit: 2024-02-22 | Discharge: 2024-02-22 | Payer: PRIVATE HEALTH INSURANCE

## 2024-03-01 ENCOUNTER — Encounter: Admit: 2024-03-01 | Discharge: 2024-03-01 | Payer: PRIVATE HEALTH INSURANCE
